# Patient Record
Sex: Female | Born: 1955
Health system: Southern US, Community
[De-identification: ages and names within clinical notes are randomized; demographics above are authoritative.]

## PROBLEM LIST (undated history)

## (undated) DIAGNOSIS — C4491 Basal cell carcinoma of skin, unspecified: Secondary | ICD-10-CM

## (undated) DIAGNOSIS — Z8619 Personal history of other infectious and parasitic diseases: Secondary | ICD-10-CM

## (undated) DIAGNOSIS — I499 Cardiac arrhythmia, unspecified: Secondary | ICD-10-CM

## (undated) DIAGNOSIS — Z9889 Other specified postprocedural states: Secondary | ICD-10-CM

## (undated) DIAGNOSIS — M549 Dorsalgia, unspecified: Secondary | ICD-10-CM

## (undated) DIAGNOSIS — I2510 Atherosclerotic heart disease of native coronary artery without angina pectoris: Secondary | ICD-10-CM

## (undated) DIAGNOSIS — M810 Age-related osteoporosis without current pathological fracture: Secondary | ICD-10-CM

## (undated) DIAGNOSIS — I071 Rheumatic tricuspid insufficiency: Secondary | ICD-10-CM

## (undated) DIAGNOSIS — Z862 Personal history of diseases of the blood and blood-forming organs and certain disorders involving the immune mechanism: Secondary | ICD-10-CM

## (undated) DIAGNOSIS — R112 Nausea with vomiting, unspecified: Secondary | ICD-10-CM

## (undated) DIAGNOSIS — K219 Gastro-esophageal reflux disease without esophagitis: Secondary | ICD-10-CM

## (undated) DIAGNOSIS — K625 Hemorrhage of anus and rectum: Secondary | ICD-10-CM

## (undated) DIAGNOSIS — C3490 Malignant neoplasm of unspecified part of unspecified bronchus or lung: Secondary | ICD-10-CM

## (undated) DIAGNOSIS — F419 Anxiety disorder, unspecified: Secondary | ICD-10-CM

## (undated) DIAGNOSIS — T4145XA Adverse effect of unspecified anesthetic, initial encounter: Secondary | ICD-10-CM

## (undated) DIAGNOSIS — R918 Other nonspecific abnormal finding of lung field: Secondary | ICD-10-CM

## (undated) DIAGNOSIS — K635 Polyp of colon: Secondary | ICD-10-CM

## (undated) DIAGNOSIS — T8859XA Other complications of anesthesia, initial encounter: Secondary | ICD-10-CM

## (undated) DIAGNOSIS — K5790 Diverticulosis of intestine, part unspecified, without perforation or abscess without bleeding: Secondary | ICD-10-CM

## (undated) HISTORY — DX: Personal history of other infectious and parasitic diseases: Z86.19

## (undated) HISTORY — DX: Rheumatic tricuspid insufficiency: I07.1

## (undated) HISTORY — DX: Other nonspecific abnormal finding of lung field: R91.8

## (undated) HISTORY — DX: Atherosclerotic heart disease of native coronary artery without angina pectoris: I25.10

## (undated) HISTORY — PX: BREAST EXCISIONAL BIOPSY: SUR124

## (undated) HISTORY — DX: Basal cell carcinoma of skin, unspecified: C44.91

## (undated) HISTORY — PX: CYSTECTOMY: SUR359

## (undated) HISTORY — DX: Diverticulosis of intestine, part unspecified, without perforation or abscess without bleeding: K57.90

## (undated) HISTORY — DX: Age-related osteoporosis without current pathological fracture: M81.0

## (undated) HISTORY — PX: COLONOSCOPY W/ POLYPECTOMY: SHX1380

## (undated) HISTORY — DX: Hemorrhage of anus and rectum: K62.5

## (undated) HISTORY — PX: TONSILLECTOMY: SUR1361

## (undated) HISTORY — PX: COLONOSCOPY: SHX174

## (undated) HISTORY — DX: Dorsalgia, unspecified: M54.9

---

## 2013-04-12 ENCOUNTER — Other Ambulatory Visit: Payer: Self-pay | Admitting: Family Medicine

## 2013-04-12 DIAGNOSIS — M810 Age-related osteoporosis without current pathological fracture: Secondary | ICD-10-CM

## 2013-04-12 DIAGNOSIS — Z1231 Encounter for screening mammogram for malignant neoplasm of breast: Secondary | ICD-10-CM

## 2013-05-27 ENCOUNTER — Other Ambulatory Visit: Payer: Self-pay

## 2013-05-27 ENCOUNTER — Ambulatory Visit: Payer: Self-pay

## 2013-06-06 ENCOUNTER — Ambulatory Visit
Admission: RE | Admit: 2013-06-06 | Discharge: 2013-06-06 | Disposition: A | Discharge status: Home / Self Care | Payer: Managed Care, Other (non HMO) | Source: Ambulatory Visit | Attending: Family Medicine | Admitting: Family Medicine

## 2013-06-06 DIAGNOSIS — M810 Age-related osteoporosis without current pathological fracture: Secondary | ICD-10-CM

## 2013-06-06 DIAGNOSIS — Z1231 Encounter for screening mammogram for malignant neoplasm of breast: Secondary | ICD-10-CM

## 2014-05-12 ENCOUNTER — Other Ambulatory Visit: Payer: Self-pay

## 2014-05-12 DIAGNOSIS — Z1231 Encounter for screening mammogram for malignant neoplasm of breast: Secondary | ICD-10-CM

## 2014-06-09 ENCOUNTER — Ambulatory Visit
Admission: RE | Admit: 2014-06-09 | Discharge: 2014-06-09 | Disposition: A | Discharge status: Home / Self Care | Payer: Managed Care, Other (non HMO) | Source: Ambulatory Visit

## 2014-06-09 ENCOUNTER — Encounter (INDEPENDENT_AMBULATORY_CARE_PROVIDER_SITE_OTHER): Payer: Self-pay

## 2014-06-09 DIAGNOSIS — Z1231 Encounter for screening mammogram for malignant neoplasm of breast: Secondary | ICD-10-CM

## 2015-04-14 ENCOUNTER — Encounter: Payer: Managed Care, Other (non HMO) | Admitting: Cardiothoracic Surgery

## 2015-04-15 DIAGNOSIS — R918 Other nonspecific abnormal finding of lung field: Secondary | ICD-10-CM

## 2015-04-15 HISTORY — DX: Other nonspecific abnormal finding of lung field: R91.8

## 2015-04-16 ENCOUNTER — Encounter: Payer: Self-pay | Admitting: Cardiothoracic Surgery

## 2015-04-16 ENCOUNTER — Institutional Professional Consult (permissible substitution) (INDEPENDENT_AMBULATORY_CARE_PROVIDER_SITE_OTHER): Payer: Managed Care, Other (non HMO) | Admitting: Cardiothoracic Surgery

## 2015-04-16 ENCOUNTER — Other Ambulatory Visit: Payer: Self-pay | Admitting: *Deleted

## 2015-04-16 VITALS — BP 141/93 | HR 73 | Resp 16 | Ht 63.0 in | Wt 145.0 lb

## 2015-04-16 DIAGNOSIS — R599 Enlarged lymph nodes, unspecified: Secondary | ICD-10-CM

## 2015-04-16 DIAGNOSIS — R591 Generalized enlarged lymph nodes: Secondary | ICD-10-CM

## 2015-04-16 DIAGNOSIS — R918 Other nonspecific abnormal finding of lung field: Secondary | ICD-10-CM | POA: Diagnosis not present

## 2015-04-17 ENCOUNTER — Encounter: Payer: Self-pay | Admitting: Cardiothoracic Surgery

## 2015-04-17 NOTE — H&P (Signed)
PCP is [name], Malka So, MD Referring Provider is [name], [name], MD  Chief Complaint  Patient presents with  . Lung Mass    right upper lobe...per CT CHEST @ [facility] 04/03/15  . 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 She is chest x-ray in early September for sudden onset of chest tightness and difficulty breathing.she denied productive cough, fever, pleuritic pain, weight loss, headache, or malaise. A chest x-ray demonstrated the density and this was followed with a CT scan of the chest. This shows an irregular mass with streaky characteristics in the right upper lobe. Mediastinal lymph nodes are not abnormally enlarged. There are some air bronchograms within the mass/density. The patient's symptoms of pain have resolved.  Past Medical History  Diagnosis Date  . Osteoporosis   . H/O cold sores   . Diverticulosis   . Colon polyp   . Rectal bleeding   . BCC (basal cell carcinoma of skin)   . COPD (chronic obstructive pulmonary disease)   . right upper lobe lung mass 04/15/2015  . Back pain     History reviewed. No pertinent past surgical history.  History reviewed. No pertinent family history.  Social History Social History  Substance Use Topics  . Smoking status: Former Smoker -- 1.00 packs/day for 5 years    Types: Cigarettes    Quit date: 07/26/1975  . Smokeless tobacco: Never Used  . Alcohol Use: 0.0 oz/week    0 Standard drinks or equivalent per week    Current Outpatient Prescriptions  Medication Sig Dispense Refill  . Calcium Carb-Cholecalciferol (CALCIUM 1000 + D PO) Take 4 tablets by mouth daily. D3 = 200 IU per each.    . denosumab (PROLIA) 60 MG/ML SOLN injection Inject 60 mg into the skin  every 6 (six) months. Administer in upper arm, thigh, or abdomen    . L-LYSINE PO Take 1,000 mg by mouth daily.     . Multiple Vitamins-Minerals (CENTRUM SILVER PO) Take 1 tablet by mouth daily.    . Omega-3 Fatty Acids (RA FISH OIL) 1400 MG CPDR Take 1 capsule by mouth daily.     No current facility-administered medications for this visit.    No Known Allergies  Review of Systems        Review of Systems :  [ y ] = yes, [  ] = no        General :  Weight gain [ no  ]    Weight loss  [ no  ]  Fatigue [ no ]  Fever [ no ]  Chills  [no  ]                                Weakness  [no  ]           Cardiac :  Chest pain/ pressure Leilani.Showers  ]  Resting SOB [  ] exertional SOB [  ]                        Orthopnea [  ]  Pedal edema  [  ]  Palpitations [  ] Syncope/presyncope '[ ]'$   Paroxysmal nocturnal dyspnea [  ]        Pulmonary : cough [ no ]  wheezing [  ]  Hemoptysis [  no] Sputum [  ] Snoring [  ]                              Pneumothorax [  ]  Sleep apnea [  ]       GI : Vomiting [  ]  Dysphagia [  ]  Melena  [  ]  Abdominal pain [  ] BRBPR [  ]              Heart burn [  ]  Constipation [  ] Diarrhea  [  ] Colonoscopy [  ]       GU : Hematuria [  ]  Dysuria [  ]  Nocturia [  ] UTI's [  ]       Vascular : Claudication [  ]  Rest pain [  ]  DVT [  ] Vein stripping [  ] leg ulcers [  ]                          TIA [  ] Stroke [  ]  Varicose veins [  ]       NEURO :  Headaches  [  ] Seizures [  ] Vision changes [  ] Paresthesias [  ]patient is left-hand dominant       Musculoskeletal :  Arthritis [  ] Gout  [  ]  Back pain [  ]  Joint pain [  ]       Skin :  Rash [  ]  Melanoma [  ]        Heme : Bleeding problems [  ]Clotting Disorders [  ] Anemia [  ]Blood Transfusion '[ ]'$        Endocrine : Diabetes [no  ] Thyroid Disorder  [  ]       Psych : Depression [  ]  Anxiety [  ]  Psych hospitalizations [  ]                                               BP  141/93 mmHg  Pulse 73  Resp 16  Ht '5\' 3"'$  (1.6 m)  Wt 145 lb (65.772 kg)  BMI 25.69 kg/m2  SpO2 97% Physical Exam      Physical Exam  General: pleasant well-developed middle-aged Caucasian female no acute distress HEENT: Normocephalic pupils equal , dentition adequate Neck: Supple without JVD, adenopathy, or bruit Chest: Clear to auscultation, symmetrical breath sounds, no rhonchi, no tenderness             or deformity Cardiovascular: Regular rate and rhythm, no murmur, no gallop, peripheral pulses             palpable in all extremities Abdomen:  Soft, nontender, no palpable mass or organomegaly Extremities: Warm, well-perfused, no clubbing cyanosis edema or tenderness,              no venous stasis changes of the legs Rectal/GU: Deferred Neuro: Grossly non--focal and symmetrical throughout Skin: Clean and dry without rash or ulceration   Diagnostic  Tests: ct. Scan of the chest performed reviewed showing 3.5-4 cm irregular right upper lobe density with some streaky margins and minimal mediastinal adenopathy. No systemic symptoms of infection or malignancy.  Impression: We will proceed with PET scan to evaluate the malignant potential of this this lung mass as well as mediastinal-supraclavicularnodes. She will complete a course of oral azithromycin to determine if there is any effect from antimicrobial treatment.  Plan:return with results of PET scan. The PET scan shows minimal activity without significant mediastinal or distant metabolic activity and we would probably recommend a transthoracic needle biopsy. If the right upper lobe mass is highly hypermetabolic without mediastinal involvement then resection-lobectomy would be recommended.   Len Childs, MD [facility] [phone]

## 2015-04-22 ENCOUNTER — Ambulatory Visit
Admission: RE | Admit: 2015-04-22 | Discharge: 2015-04-22 | Disposition: A | Discharge status: Home / Self Care | Payer: Managed Care, Other (non HMO) | Source: Ambulatory Visit | Attending: Cardiothoracic Surgery | Admitting: Cardiothoracic Surgery

## 2015-04-22 DIAGNOSIS — R918 Other nonspecific abnormal finding of lung field: Secondary | ICD-10-CM | POA: Diagnosis not present

## 2015-04-22 LAB — GLUCOSE, CAPILLARY: Glucose-Capillary: 72 mg/dL (ref 65–99)

## 2015-04-22 MED ORDER — FLUDEOXYGLUCOSE F - 18 (FDG) INJECTION
12.2400 | Freq: Once | INTRAVENOUS | Status: DC | PRN
  Administered 2015-04-22: 12.24 via INTRAVENOUS
  Filled 2015-04-22: qty 12.24

## 2015-04-29 ENCOUNTER — Other Ambulatory Visit: Payer: Self-pay | Admitting: *Deleted

## 2015-04-29 DIAGNOSIS — R918 Other nonspecific abnormal finding of lung field: Secondary | ICD-10-CM

## 2015-04-29 DIAGNOSIS — R599 Enlarged lymph nodes, unspecified: Secondary | ICD-10-CM

## 2015-04-29 DIAGNOSIS — R591 Generalized enlarged lymph nodes: Secondary | ICD-10-CM

## 2015-04-30 ENCOUNTER — Ambulatory Visit: Payer: Managed Care, Other (non HMO) | Admitting: Cardiothoracic Surgery

## 2015-04-30 ENCOUNTER — Encounter (HOSPITAL_COMMUNITY)
Admission: RE | Admit: 2015-04-30 | Discharge: 2015-04-30 | Disposition: A | Discharge status: Home / Self Care | Payer: Managed Care, Other (non HMO) | Source: Ambulatory Visit | Attending: Cardiothoracic Surgery | Admitting: Cardiothoracic Surgery

## 2015-04-30 ENCOUNTER — Encounter (HOSPITAL_COMMUNITY): Payer: Self-pay

## 2015-04-30 VITALS — BP 164/93 | HR 61 | Temp 98.1°F | Resp 20 | Wt 145.1 lb

## 2015-04-30 DIAGNOSIS — R599 Enlarged lymph nodes, unspecified: Secondary | ICD-10-CM | POA: Diagnosis not present

## 2015-04-30 DIAGNOSIS — R918 Other nonspecific abnormal finding of lung field: Secondary | ICD-10-CM | POA: Diagnosis not present

## 2015-04-30 DIAGNOSIS — Z01812 Encounter for preprocedural laboratory examination: Secondary | ICD-10-CM | POA: Diagnosis not present

## 2015-04-30 DIAGNOSIS — R591 Generalized enlarged lymph nodes: Secondary | ICD-10-CM

## 2015-04-30 HISTORY — DX: Other specified postprocedural states: Z98.890

## 2015-04-30 HISTORY — DX: Polyp of colon: K63.5

## 2015-04-30 HISTORY — DX: Cardiac arrhythmia, unspecified: I49.9

## 2015-04-30 HISTORY — DX: Other complications of anesthesia, initial encounter: T88.59XA

## 2015-04-30 HISTORY — DX: Personal history of diseases of the blood and blood-forming organs and certain disorders involving the immune mechanism: Z86.2

## 2015-04-30 HISTORY — DX: Adverse effect of unspecified anesthetic, initial encounter: T41.45XA

## 2015-04-30 HISTORY — DX: Gastro-esophageal reflux disease without esophagitis: K21.9

## 2015-04-30 HISTORY — DX: Nausea with vomiting, unspecified: R11.2

## 2015-04-30 HISTORY — DX: Anxiety disorder, unspecified: F41.9

## 2015-04-30 LAB — CBC
HCT: 43.2 % (ref 36.0–46.0)
Hemoglobin: 14.4 g/dL (ref 12.0–15.0)
MCH: 31.4 pg (ref 26.0–34.0)
MCHC: 33.3 g/dL (ref 30.0–36.0)
MCV: 94.3 fL (ref 78.0–100.0)
Platelets: 320 10*3/uL (ref 150–400)
RBC: 4.58 MIL/uL (ref 3.87–5.11)
RDW: 13.2 % (ref 11.5–15.5)
WBC: 6.4 10*3/uL (ref 4.0–10.5)

## 2015-04-30 LAB — COMPREHENSIVE METABOLIC PANEL
ALT: 24 U/L (ref 14–54)
AST: 27 U/L (ref 15–41)
Albumin: 4.3 g/dL (ref 3.5–5.0)
Alkaline Phosphatase: 35 U/L — ABNORMAL LOW (ref 38–126)
Anion gap: 7 (ref 5–15)
BUN: 10 mg/dL (ref 6–20)
CO2: 28 mmol/L (ref 22–32)
Calcium: 9.8 mg/dL (ref 8.9–10.3)
Chloride: 103 mmol/L (ref 101–111)
Creatinine, Ser: 0.76 mg/dL (ref 0.44–1.00)
GFR calc Af Amer: >60 mL/min (ref >60)
GFR calc non Af Amer: >60 mL/min (ref >60)
Glucose, Bld: 96 mg/dL (ref 65–99)
Potassium: 4 mmol/L (ref 3.5–5.1)
Sodium: 138 mmol/L (ref 135–145)
Total Bilirubin: 0.6 mg/dL (ref 0.3–1.2)
Total Protein: 6.7 g/dL (ref 6.5–8.1)

## 2015-04-30 LAB — PROTIME-INR
INR: 0.95 (ref 0.00–1.49)
Prothrombin Time: 12.9 seconds (ref 11.6–15.2)

## 2015-04-30 LAB — APTT: aPTT: 28 seconds (ref 24–37)

## 2015-04-30 NOTE — Pre-Procedure Instructions (Signed)
    Rose Figueroa  04/30/2015     No Pharmacies Listed   Your procedure is scheduled on Thursday, October 13th, 2016.  Report to [facility] Admitting at 5:30 A.M.  Call this number if you have problems the morning of surgery:  [phone]   Remember:  Do not eat food or drink liquids after midnight.  Take these medicines the morning of surgery with A SIP OF WATER: None.  Stop taking: Aspirin, NSAIDS, Aleve, Naproxen, Ibuprofen, BC's, Goody's, Advil, Motrin, Fish oil, all herbal medications, and all vitamins.    Do not wear jewelry, make-up or nail polish.  Do not wear lotions, powders, or perfumes.  You may wear deodorant.  Do not shave 48 hours prior to surgery.    Do not bring valuables to the hospital.  [facility] is not responsible for any belongings or valuables.  Contacts, dentures or bridgework may not be worn into surgery.  Leave your suitcase in the car.  After surgery it may be brought to your room.  For patients admitted to the hospital, discharge time will be determined by your treatment team.  Patients discharged the day of surgery will not be allowed to drive home.   Special instructions:  See attached.   Please read over the following fact sheets that you were given. Pain Booklet, Coughing and Deep Breathing and Surgical Site Infection Prevention

## 2015-04-30 NOTE — Progress Notes (Signed)
PCP - Dr. [name] at [facility] Cardiologist - denies  EKG- requested CXR - need DOS  Echo/Stress Test/Cardiac Cath - denies  Patient denies chest pain at PAT appointment.  Patient states that she does have shortness of breath at times but it is consistent with the symptoms that she has been having.

## 2015-05-06 ENCOUNTER — Ambulatory Visit (INDEPENDENT_AMBULATORY_CARE_PROVIDER_SITE_OTHER): Payer: Managed Care, Other (non HMO) | Admitting: Cardiothoracic Surgery

## 2015-05-06 ENCOUNTER — Encounter: Payer: Self-pay | Admitting: Cardiothoracic Surgery

## 2015-05-06 VITALS — BP 138/96 | HR 67 | Resp 16 | Ht 63.0 in | Wt 145.0 lb

## 2015-05-06 DIAGNOSIS — R599 Enlarged lymph nodes, unspecified: Secondary | ICD-10-CM

## 2015-05-06 DIAGNOSIS — R591 Generalized enlarged lymph nodes: Secondary | ICD-10-CM

## 2015-05-06 DIAGNOSIS — R918 Other nonspecific abnormal finding of lung field: Secondary | ICD-10-CM | POA: Diagnosis not present

## 2015-05-06 NOTE — Progress Notes (Signed)
Spoke with patient and instructed her to arrive at 1130 am 05/07/15.

## 2015-05-06 NOTE — Progress Notes (Signed)
PCP is [name], Malka So, MD Referring Provider is [name], [name], MD  Chief Complaint  Patient presents with  . Follow-up    to discuss PET and BRONCH/EBUS    HPI:the patient returns for further discussion of recently diagnosed right upper lobe lung mass. Since her initial consultation she has had a PET scan. This shows the 3 cm right upper lobe spiculated mass to be intensely hypermetabolic SUV of 84.1. There is a peritracheal node with SUV of 7.0 and a subcarinal lymph node with hypermetabolic activity as well. There is a small low activity level supraclavicular right lymph node. No evidence of abdominal or skeletal metastases .  Since her initial consultation the patient has had some right-sided chest pain which was worse after she fell on her right shoulder. She also has some tenderness over her left pretibial thoracotomy. No cough, no hemoptysis, no shortness of breath no headache or dizziness.  Past Medical History  Diagnosis Date  . Osteoporosis   . H/O cold sores   . Diverticulosis   . Rectal bleeding   . BCC (basal cell carcinoma of skin)   . right upper lobe lung mass 04/15/2015  . Back pain   . Colon polyps   . Complication of anesthesia     pt. states that she is difficult to arouse  . PONV (postoperative nausea and vomiting)   . Dysrhythmia   . Anxiety   . Acid reflux   . History of anemia     Past Surgical History  Procedure Laterality Date  . Cystectomy      right breast x 2  . Colonoscopy    . Colonoscopy w/ polypectomy      No family history on file.  Social History Social History  Substance Use Topics  . Smoking status: Former Smoker -- 1.00 packs/day for 5 years    Types: Cigarettes    Quit date: 07/26/1975  . Smokeless tobacco: Never Used  . Alcohol Use: 0.0 oz/week    0 Standard drinks or equivalent per week     Comment: occ.     Current Outpatient Prescriptions  Medication Sig Dispense Refill  . Calcium Carb-Cholecalciferol (CALCIUM 1000 +  D PO) Take 4 tablets by mouth daily. D3 = 200 IU per each.    . denosumab (PROLIA) 60 MG/ML SOLN injection Inject 60 mg into the skin every 6 (six) months. Administer in upper arm, thigh, or abdomen    . L-LYSINE PO Take 1,000 mg by mouth daily.     . Multiple Vitamins-Minerals (CENTRUM SILVER PO) Take 1 tablet by mouth daily.    . Omega-3 Fatty Acids (RA FISH OIL) 1400 MG CPDR Take 1 capsule by mouth daily.    . vitamin B-12 (CYANOCOBALAMIN) 1000 MCG tablet Take 1,000 mcg by mouth daily.     No current facility-administered medications for this visit.    No Known Allergies  Review of Systems  She has stopped taking her calcium and over-the-counter meds for osteoporosis  BP 138/96 mmHg  Pulse 67  Resp 16  Ht '5\' 3"'$  (1.6 m)  Wt 145 lb (65.772 kg)  BMI 25.69 kg/m2  SpO2 98% Physical Exam Alert and comfortable No palpable adenopathy the neck Breath sounds clear and equal Heart rate regular Neuro intact  Diagnostic Tests: PET scan images personally reviewed and counseled with patient and husband, printed report provided patient Impression: Probable stage III carcinoma lung, high likelihood of adenocarcinoma in this 59 year old female with minimal smoking history  Plan:we'll proceed with video bronchoscopy, EBUS and mediastinoscopy tomorrow to obtain tissue diagnosis and for staging. The details of procedure including indications benefits and risks were discussed completely with the patient and husband present.   Len Childs, MD [facility] [phone]

## 2015-05-07 ENCOUNTER — Encounter (HOSPITAL_COMMUNITY)
Admission: RE | Disposition: A | Discharge status: Home / Self Care | Payer: Self-pay | Source: Ambulatory Visit | Attending: Cardiothoracic Surgery

## 2015-05-07 ENCOUNTER — Ambulatory Visit (HOSPITAL_COMMUNITY)
Admission: RE | Admit: 2015-05-07 | Discharge: 2015-05-07 | Disposition: A | Discharge status: Home / Self Care | Payer: Managed Care, Other (non HMO) | Source: Ambulatory Visit | Attending: Cardiothoracic Surgery | Admitting: Cardiothoracic Surgery

## 2015-05-07 ENCOUNTER — Ambulatory Visit (HOSPITAL_COMMUNITY): Payer: Managed Care, Other (non HMO) | Admitting: Anesthesiology

## 2015-05-07 ENCOUNTER — Encounter: Payer: Managed Care, Other (non HMO) | Admitting: *Deleted

## 2015-05-07 ENCOUNTER — Encounter (HOSPITAL_COMMUNITY): Payer: Self-pay | Admitting: *Deleted

## 2015-05-07 ENCOUNTER — Ambulatory Visit (HOSPITAL_COMMUNITY): Payer: Managed Care, Other (non HMO)

## 2015-05-07 DIAGNOSIS — R591 Generalized enlarged lymph nodes: Secondary | ICD-10-CM

## 2015-05-07 DIAGNOSIS — C801 Malignant (primary) neoplasm, unspecified: Secondary | ICD-10-CM | POA: Diagnosis not present

## 2015-05-07 DIAGNOSIS — C771 Secondary and unspecified malignant neoplasm of intrathoracic lymph nodes: Secondary | ICD-10-CM | POA: Diagnosis not present

## 2015-05-07 DIAGNOSIS — Z7983 Long term (current) use of bisphosphonates: Secondary | ICD-10-CM | POA: Insufficient documentation

## 2015-05-07 DIAGNOSIS — R222 Localized swelling, mass and lump, trunk: Secondary | ICD-10-CM | POA: Diagnosis not present

## 2015-05-07 DIAGNOSIS — R599 Enlarged lymph nodes, unspecified: Secondary | ICD-10-CM

## 2015-05-07 DIAGNOSIS — R918 Other nonspecific abnormal finding of lung field: Secondary | ICD-10-CM

## 2015-05-07 DIAGNOSIS — Z79899 Other long term (current) drug therapy: Secondary | ICD-10-CM | POA: Insufficient documentation

## 2015-05-07 DIAGNOSIS — K219 Gastro-esophageal reflux disease without esophagitis: Secondary | ICD-10-CM | POA: Diagnosis not present

## 2015-05-07 DIAGNOSIS — Z87891 Personal history of nicotine dependence: Secondary | ICD-10-CM | POA: Insufficient documentation

## 2015-05-07 HISTORY — PX: MEDIASTINOSCOPY: SHX5086

## 2015-05-07 HISTORY — PX: VIDEO BRONCHOSCOPY WITH ENDOBRONCHIAL ULTRASOUND: SHX6177

## 2015-05-07 SURGERY — BRONCHOSCOPY, WITH EBUS
Anesthesia: General

## 2015-05-07 MED ORDER — PROPOFOL 10 MG/ML IV BOLUS
INTRAVENOUS | Status: DC | PRN
  Administered 2015-05-07: 150 mg via INTRAVENOUS

## 2015-05-07 MED ORDER — LIDOCAINE HCL 4 % MT SOLN
OROMUCOSAL | Status: DC | PRN
  Administered 2015-05-07: 4 mL via TOPICAL

## 2015-05-07 MED ORDER — MIDAZOLAM HCL 2 MG/2ML IJ SOLN
INTRAMUSCULAR | Status: AC
  Filled 2015-05-07: qty 4

## 2015-05-07 MED ORDER — LIDOCAINE HCL (CARDIAC) 20 MG/ML IV SOLN
INTRAVENOUS | Status: AC
  Filled 2015-05-07: qty 5

## 2015-05-07 MED ORDER — OXYCODONE HCL 5 MG PO TABS
5.0000 mg | ORAL_TABLET | ORAL | Status: DC | PRN
  Administered 2015-05-07: 5 mg via ORAL

## 2015-05-07 MED ORDER — ONDANSETRON HCL 4 MG/2ML IJ SOLN
INTRAMUSCULAR | Status: DC | PRN
  Administered 2015-05-07: 4 mg via INTRAVENOUS

## 2015-05-07 MED ORDER — ROCURONIUM BROMIDE 50 MG/5ML IV SOLN
INTRAVENOUS | Status: AC
  Filled 2015-05-07: qty 1

## 2015-05-07 MED ORDER — SUGAMMADEX SODIUM 200 MG/2ML IV SOLN
INTRAVENOUS | Status: DC | PRN
Start: 2015-05-07 — End: 2015-05-07
  Administered 2015-05-07: 200 mg via INTRAVENOUS

## 2015-05-07 MED ORDER — FENTANYL CITRATE (PF) 250 MCG/5ML IJ SOLN
INTRAMUSCULAR | Status: AC
  Filled 2015-05-07: qty 5

## 2015-05-07 MED ORDER — PROPOFOL 10 MG/ML IV BOLUS
INTRAVENOUS | Status: AC
  Filled 2015-05-07: qty 20

## 2015-05-07 MED ORDER — HEMOSTATIC AGENTS (NO CHARGE) OPTIME
TOPICAL | Status: DC | PRN
  Administered 2015-05-07: 1 via TOPICAL

## 2015-05-07 MED ORDER — PROMETHAZINE HCL 25 MG/ML IJ SOLN
6.2500 mg | INTRAMUSCULAR | Status: DC | PRN
  Administered 2015-05-07: 6.25 mg via INTRAVENOUS

## 2015-05-07 MED ORDER — SUGAMMADEX SODIUM 200 MG/2ML IV SOLN
INTRAVENOUS | Status: AC
  Filled 2015-05-07: qty 2

## 2015-05-07 MED ORDER — PROMETHAZINE HCL 25 MG/ML IJ SOLN
INTRAMUSCULAR | Status: AC
  Filled 2015-05-07: qty 1

## 2015-05-07 MED ORDER — OXYCODONE HCL 5 MG PO TABS
ORAL_TABLET | ORAL | Status: AC
  Filled 2015-05-07: qty 1

## 2015-05-07 MED ORDER — FENTANYL CITRATE (PF) 100 MCG/2ML IJ SOLN
INTRAMUSCULAR | Status: AC
  Filled 2015-05-07: qty 2

## 2015-05-07 MED ORDER — PHENYLEPHRINE HCL 10 MG/ML IJ SOLN
INTRAMUSCULAR | Status: DC | PRN
  Administered 2015-05-07: 40 ug via INTRAVENOUS
  Administered 2015-05-07 (×4): 80 ug via INTRAVENOUS
  Administered 2015-05-07: 40 ug via INTRAVENOUS
  Administered 2015-05-07: 80 ug via INTRAVENOUS

## 2015-05-07 MED ORDER — MIDAZOLAM HCL 5 MG/5ML IJ SOLN
INTRAMUSCULAR | Status: DC | PRN
  Administered 2015-05-07: 2 mg via INTRAVENOUS

## 2015-05-07 MED ORDER — FENTANYL CITRATE (PF) 100 MCG/2ML IJ SOLN: 25.0000 ug | INTRAMUSCULAR | Status: DC | PRN

## 2015-05-07 MED ORDER — LIDOCAINE HCL (CARDIAC) 20 MG/ML IV SOLN
INTRAVENOUS | Status: DC | PRN
  Administered 2015-05-07: 100 mg via INTRAVENOUS

## 2015-05-07 MED ORDER — CEFAZOLIN SODIUM-DEXTROSE 2-3 GM-% IV SOLR
2.0000 g | Freq: Once | INTRAVENOUS | Status: AC
  Administered 2015-05-07: 2 g via INTRAVENOUS
  Filled 2015-05-07: qty 50

## 2015-05-07 MED ORDER — ROCURONIUM BROMIDE 100 MG/10ML IV SOLN
INTRAVENOUS | Status: DC | PRN
  Administered 2015-05-07: 10 mg via INTRAVENOUS
  Administered 2015-05-07: 50 mg via INTRAVENOUS

## 2015-05-07 MED ORDER — EPHEDRINE SULFATE 50 MG/ML IJ SOLN
INTRAMUSCULAR | Status: DC | PRN
  Administered 2015-05-07 (×2): 5 mg via INTRAVENOUS

## 2015-05-07 MED ORDER — CEFAZOLIN SODIUM-DEXTROSE 2-3 GM-% IV SOLR
INTRAVENOUS | Status: AC
  Filled 2015-05-07: qty 50

## 2015-05-07 MED ORDER — 0.9 % SODIUM CHLORIDE (POUR BTL) OPTIME
TOPICAL | Status: DC | PRN
  Administered 2015-05-07: 1000 mL

## 2015-05-07 MED ORDER — FENTANYL CITRATE (PF) 250 MCG/5ML IJ SOLN
INTRAMUSCULAR | Status: DC | PRN
  Administered 2015-05-07: 50 ug via INTRAVENOUS
  Administered 2015-05-07: 100 ug via INTRAVENOUS
  Administered 2015-05-07: 50 ug via INTRAVENOUS

## 2015-05-07 MED ORDER — NEOSTIGMINE METHYLSULFATE 10 MG/10ML IV SOLN
INTRAVENOUS | Status: AC
  Filled 2015-05-07: qty 1

## 2015-05-07 MED ORDER — LACTATED RINGERS IV SOLN
INTRAVENOUS | Status: DC
  Administered 2015-05-07: 50 mL/h via INTRAVENOUS
  Administered 2015-05-07: 15:00:00 via INTRAVENOUS

## 2015-05-07 SURGICAL SUPPLY — 70 items
BENZOIN TINCTURE PRP APPL 2/3 (GAUZE/BANDAGES/DRESSINGS) ×3 IMPLANT
BLADE SURG 10 STRL SS (BLADE) ×3 IMPLANT
BLADE SURG 15 STRL LF DISP TIS (BLADE) ×1 IMPLANT
BLADE SURG 15 STRL SS (BLADE) ×2
BRUSH CYTOL CELLEBRITY 1.5X140 (MISCELLANEOUS) IMPLANT
BRUSH CYTOL CELLEBRITY 1.9X150 (MISCELLANEOUS) ×3 IMPLANT
CANISTER SUCTION 2500CC (MISCELLANEOUS) ×6 IMPLANT
CLIP TI MEDIUM 6 (CLIP) IMPLANT
CLIP TI WIDE RED SMALL 24 (CLIP) ×3 IMPLANT
CLIP TI WIDE RED SMALL 6 (CLIP) IMPLANT
CLOSURE STERI-STRIP 1/2X4 (GAUZE/BANDAGES/DRESSINGS) ×1
CLSR STERI-STRIP ANTIMIC 1/2X4 (GAUZE/BANDAGES/DRESSINGS) ×2 IMPLANT
CONT SPEC 4OZ CLIKSEAL STRL BL (MISCELLANEOUS) ×9 IMPLANT
CONT SPECI 4OZ STER CLIK (MISCELLANEOUS) ×12 IMPLANT
COTTONBALL LRG STERILE PKG (GAUZE/BANDAGES/DRESSINGS) IMPLANT
COVER DOME SNAP 22 D (MISCELLANEOUS) ×3 IMPLANT
COVER SURGICAL LIGHT HANDLE (MISCELLANEOUS) ×6 IMPLANT
COVER TABLE BACK 60X90 (DRAPES) ×3 IMPLANT
DERMABOND ADVANCED (GAUZE/BANDAGES/DRESSINGS) ×2
DERMABOND ADVANCED .7 DNX12 (GAUZE/BANDAGES/DRESSINGS) ×1 IMPLANT
DRAPE LAPAROTOMY T 102X78X121 (DRAPES) ×3 IMPLANT
DRSG AQUACEL AG ADV 3.5X14 (GAUZE/BANDAGES/DRESSINGS) ×3 IMPLANT
ELECT CAUTERY BLADE 6.4 (BLADE) ×3 IMPLANT
ELECT REM PT RETURN 9FT ADLT (ELECTROSURGICAL) ×3
ELECTRODE REM PT RTRN 9FT ADLT (ELECTROSURGICAL) ×1 IMPLANT
FORCEPS BIOP RJ4 1.8 (CUTTING FORCEPS) IMPLANT
GAUZE SPONGE 4X4 12PLY STRL (GAUZE/BANDAGES/DRESSINGS) ×3 IMPLANT
GAUZE SPONGE 4X4 16PLY XRAY LF (GAUZE/BANDAGES/DRESSINGS) ×3 IMPLANT
GLOVE BIO SURGEON STRL SZ7.5 (GLOVE) ×9 IMPLANT
GOWN STRL REUS W/ TWL LRG LVL3 (GOWN DISPOSABLE) ×2 IMPLANT
GOWN STRL REUS W/TWL LRG LVL3 (GOWN DISPOSABLE) ×4
HEMOSTAT SURGICEL 2X14 (HEMOSTASIS) IMPLANT
KIT BASIN OR (CUSTOM PROCEDURE TRAY) ×3 IMPLANT
KIT CLEAN ENDO COMPLIANCE (KITS) ×9 IMPLANT
KIT ROOM TURNOVER OR (KITS) ×6 IMPLANT
MARKER SKIN DUAL TIP RULER LAB (MISCELLANEOUS) ×3 IMPLANT
NEEDLE 22X1 1/2 (OR ONLY) (NEEDLE) IMPLANT
NEEDLE BIOPSY TRANSBRONCH 21G (NEEDLE) IMPLANT
NEEDLE BLUNT 18X1 FOR OR ONLY (NEEDLE) IMPLANT
NEEDLE SONO TIP II EBUS (NEEDLE) ×3 IMPLANT
NEEDLE SYS SONOTIP II EBUSTBNA (NEEDLE) ×3 IMPLANT
NS IRRIG 1000ML POUR BTL (IV SOLUTION) ×6 IMPLANT
OIL SILICONE PENTAX (PARTS (SERVICE/REPAIRS)) ×3 IMPLANT
PACK SURGICAL SETUP 50X90 (CUSTOM PROCEDURE TRAY) ×3 IMPLANT
PAD ARMBOARD 7.5X6 YLW CONV (MISCELLANEOUS) ×12 IMPLANT
PENCIL BUTTON HOLSTER BLD 10FT (ELECTRODE) ×3 IMPLANT
SPONGE INTESTINAL PEANUT (DISPOSABLE) ×3 IMPLANT
STAPLER VISISTAT 35W (STAPLE) IMPLANT
SUT SILK 2 0 TIES 10X30 (SUTURE) IMPLANT
SUT VIC AB 2-0 CT1 27 (SUTURE) ×2
SUT VIC AB 2-0 CT1 TAPERPNT 27 (SUTURE) ×1 IMPLANT
SUT VIC AB 3-0 SH 27 (SUTURE) ×2
SUT VIC AB 3-0 SH 27X BRD (SUTURE) ×1 IMPLANT
SUT VIC AB 3-0 SH 8-18 (SUTURE) ×6 IMPLANT
SUT VIC AB 3-0 X1 27 (SUTURE) ×3 IMPLANT
SWAB COLLECTION DEVICE MRSA (MISCELLANEOUS) IMPLANT
SYR 20CC LL (SYRINGE) ×3 IMPLANT
SYR 20ML ECCENTRIC (SYRINGE) ×3 IMPLANT
SYR 5ML LUER SLIP (SYRINGE) ×3 IMPLANT
SYR CONTROL 10ML LL (SYRINGE) ×3 IMPLANT
SYRINGE 10CC LL (SYRINGE) ×3 IMPLANT
TOWEL OR 17X24 6PK STRL BLUE (TOWEL DISPOSABLE) ×3 IMPLANT
TOWEL OR 17X26 10 PK STRL BLUE (TOWEL DISPOSABLE) ×6 IMPLANT
TRAP SPECIMEN MUCOUS 40CC (MISCELLANEOUS) ×9 IMPLANT
TUBE ANAEROBIC SPECIMEN COL (MISCELLANEOUS) IMPLANT
TUBE CONNECTING 12'X1/4 (SUCTIONS) ×1
TUBE CONNECTING 12X1/4 (SUCTIONS) ×2 IMPLANT
TUBE CONNECTING 20'X1/4 (TUBING) ×2
TUBE CONNECTING 20X1/4 (TUBING) ×4 IMPLANT
WATER STERILE IRR 1000ML POUR (IV SOLUTION) ×3 IMPLANT

## 2015-05-07 NOTE — Brief Op Note (Addendum)
05/07/2015  4:15 PM  PATIENT:  Rose Figueroa  59 y.o. female  PRE-OPERATIVE DIAGNOSIS:  LUNG MASS ADENOPATHY  POST-OPERATIVE DIAGNOSIS:  LUNG MASS ADENOPATHY  PROCEDURE:  Procedure(s): VIDEO BRONCHOSCOPY WITH ENDOBRONCHIAL ULTRASOUND (N/A) MEDIASTINOSCOPY (N/A)  SURGEON:  Surgeon(s) and Role:    * Ivin Poot, MD - Primary  PHYSICIAN ASSISTANT:   ASSISTANTS: none   ANESTHESIA:   general  EBL:  Total I/O In: 800 [I.V.:800] Out: -   BLOOD ADMINISTERED:none  DRAINS: none   LOCAL MEDICATIONS USED:  NONE  SPECIMEN:  Aspirate and Biopsy / Limited Resection                          Level 7 subcarinal node, 4 R node bx  DISPOSITION OF SPECIMEN:  PATHOLOGY  COUNTS:  YES  TOURNIQUET:  * No tourniquets in log *  DICTATION: .Dragon Dictation  PLAN OF CARE: Discharge to home after PACU  PATIENT DISPOSITION:  PACU - hemodynamically stable.   Delay start of Pharmacological VTE agent (>24hrs) due to surgical blood loss or risk of bleeding: yes

## 2015-05-07 NOTE — Anesthesia Procedure Notes (Signed)
Procedure Name: Intubation Date/Time: 05/07/2015 1:44 PM Performed by: Tamala Fothergill S Patient Re-evaluated:Patient Re-evaluated prior to inductionOxygen Delivery Method: Circle system utilized Preoxygenation: Pre-oxygenation with 100% oxygen Intubation Type: IV induction Ventilation: Mask ventilation without difficulty Laryngoscope Size: Mac and 4 Grade View: Grade II Tube type: Oral Tube size: 8.0 mm Number of attempts: 1 Placement Confirmation: ETT inserted through vocal cords under direct vision,  breath sounds checked- equal and bilateral and positive ETCO2 Tube secured with: Tape Dental Injury: Teeth and Oropharynx as per pre-operative assessment

## 2015-05-07 NOTE — Anesthesia Postprocedure Evaluation (Signed)
  Anesthesia Post-op Note  Patient: Rose Figueroa  Procedure(s) Performed: Procedure(s) (LRB): VIDEO BRONCHOSCOPY WITH ENDOBRONCHIAL ULTRASOUND (N/A) MEDIASTINOSCOPY (N/A)  Patient Location: PACU  Anesthesia Type: General  Level of Consciousness: awake and alert   Airway and Oxygen Therapy: Patient Spontanous Breathing  Post-op Pain: mild  Post-op Assessment: Post-op Vital signs reviewed, Patient's Cardiovascular Status Stable, Respiratory Function Stable, Patent Airway and No signs of Nausea or vomiting  Last Vitals:  Filed Vitals:   05/07/15 1637  BP: 120/72  Pulse: 88  Temp:   Resp: 17    Post-op Vital Signs: stable   Complications: No apparent anesthesia complications

## 2015-05-07 NOTE — Anesthesia Preprocedure Evaluation (Addendum)
Anesthesia Evaluation  Patient identified by MRN, date of birth, ID band Patient awake    Reviewed: Allergy & Precautions, NPO status , Patient's Chart, lab work & pertinent test results  History of Anesthesia Complications (+) PONV and history of anesthetic complications  Airway Mallampati: II  TM Distance: >3 FB Neck ROM: Full    Dental  (+) Teeth Intact, Dental Advisory Given   Pulmonary former smoker,  Lung ca    Pulmonary exam normal breath sounds clear to auscultation       Cardiovascular Exercise Tolerance: Good (-) hypertension(-) angina(-) Past MI Normal cardiovascular exam+ dysrhythmias (in 20s) Atrial Fibrillation  Rhythm:Regular Rate:Normal     Neuro/Psych PSYCHIATRIC DISORDERS Anxiety negative neurological ROS     GI/Hepatic negative GI ROS, Neg liver ROS, GERD  ,  Endo/Other  negative endocrine ROS  Renal/GU negative Renal ROS     Musculoskeletal negative musculoskeletal ROS (+)   Abdominal   Peds  Hematology negative hematology ROS (+)   Anesthesia Other Findings Day of surgery medications reviewed with the patient.  Reproductive/Obstetrics negative OB ROS                            Anesthesia Physical Anesthesia Plan  ASA: III  Anesthesia Plan: General   Post-op Pain Management:    Induction: Intravenous  Airway Management Planned: Oral ETT  Additional Equipment:   Intra-op Plan:   Post-operative Plan: Extubation in OR  Informed Consent: I have reviewed the patients History and Physical, chart, labs and discussed the procedure including the risks, benefits and alternatives for the proposed anesthesia with the patient or authorized representative who has indicated his/her understanding and acceptance.   Dental advisory given  Plan Discussed with: CRNA  Anesthesia Plan Comments: (Risks/benefits of general anesthesia discussed with patient including risk of  damage to teeth, lips, gum, and tongue, nausea/vomiting, allergic reactions to medications, and the possibility of heart attack, stroke and death.  All patient questions answered.  Patient wishes to proceed.)        Anesthesia Quick Evaluation

## 2015-05-07 NOTE — Progress Notes (Signed)
The patient was examined and preop studies reviewed. There has been no change from the prior exam and the patient is ready for surgery.  plan broncoscopy, EBUS and mediastinoscopy on B [place]

## 2015-05-07 NOTE — Transfer of Care (Signed)
Immediate Anesthesia Transfer of Care Note  Patient: Rose Figueroa  Procedure(s) Performed: Procedure(s): VIDEO BRONCHOSCOPY WITH ENDOBRONCHIAL ULTRASOUND (N/A) MEDIASTINOSCOPY (N/A)  Patient Location: PACU  Anesthesia Type:General  Level of Consciousness: awake, alert  and oriented  Airway & Oxygen Therapy: Patient Spontanous Breathing and Patient connected to nasal cannula oxygen  Post-op Assessment: Report given to RN and Post -op Vital signs reviewed and stable  Post vital signs: Reviewed and stable  Last Vitals:  Filed Vitals:   05/07/15 1621  BP:   Pulse:   Temp: 36.4 C  Resp:     Complications: No apparent anesthesia complications

## 2015-05-08 ENCOUNTER — Encounter (HOSPITAL_COMMUNITY): Payer: Self-pay | Admitting: Cardiothoracic Surgery

## 2015-05-08 NOTE — Op Note (Signed)
NAMEMANDALYN, PASQUA NO.:  [id number]  MEDICAL RECORD NO.:  [id number]  LOCATION:  [facility]                         FACILITY:  [facility]  PHYSICIAN:  Ivin Poot, M.D.  DATE OF BIRTH:  1956/04/28  DATE OF PROCEDURE:  05/07/2015 DATE OF DISCHARGE:  05/07/2015                              OPERATIVE REPORT   OPERATION: 1. Video bronchoscopy. 2. EBUS-endobronchial ultrasound-guided transbronchial biopsy of     mediastinal lymph node. 3. Mediastinoscopy and biopsy of mediastinal lymph nodes.  SURGEON:  Ivin Poot, MD.  PREOPERATIVE DIAGNOSIS:  Right upper lobe mass with PET positive mediastinal lymph nodes.  POSTOPERATIVE DIAGNOSIS:  Right upper lobe mass with PET positive mediastinal lymph nodes.  ANESTHESIA:  General.  DESCRIPTION OF PROCEDURE:  After the patient had been counseled and prepared for bronchoscopy and mediastinoscopy and informed consent was obtained, the patient was brought to the operating room and placed supine on the operating table where general anesthesia was induced.  A proper time-out was performed.  Through the endotracheal tube, a video bronchoscope was passed.  Images of the endobronchial tree were of good quality.  The distal trachea and carina were sharp and normal.  The bronchoscope was passed down the right mainstem bronchus.  The endobronchial segments of the right upper lobe, right middle lobe, and right lower lobes were visualized and no endobronchial tumors were noted.  Washings of the right upper lobe and brushings of the right upper lobe were taken and sent for cytology.  The bronchoscope was then passed down the left mainstem bronchus.  The mainstem bronchus was clear.  The endobronchial segments of the left upper lobe and left lower lobe were visualized and there were no endobronchial lesions or abnormalities noted.  The bronchoscope was withdrawn.  The EBUS scope was then inserted.  Using ultrasound imaging, the  level 7 carinal lymph node was imaged and several transbronchial aspirations were performed.  These were sent for both quick prep and permanent cytology prep.  The bronchoscope was withdrawn.  The patient was then prepped and draped from the neck to the upper abdomen.  A second time-out was performed for mediastinoscopy.  A small incision was made above the sternal notch.  The soft tissue was dissected down to the pretracheal plane.  The pretracheal plane was followed down underneath the innominate artery.  The 4R lymph node region was then sampled with a biopsy forceps and the tissue was sent for permanent section.  There was minimal bleeding.  The incision was closed in layers using interrupted 3-0 Vicryl for the subcutaneous layer and running subcuticular Vicryl for the skin.  Sterile dressings applied.  The patient was extubated and returned to the recovery room in stable condition.     Ivin Poot, M.D.     PV/MEDQ  D:  05/07/2015  T:  05/08/2015  Job:  [id number]

## 2015-05-11 ENCOUNTER — Encounter: Payer: Self-pay | Admitting: *Deleted

## 2015-05-11 ENCOUNTER — Telehealth: Payer: Self-pay | Admitting: *Deleted

## 2015-05-11 DIAGNOSIS — R918 Other nonspecific abnormal finding of lung field: Secondary | ICD-10-CM

## 2015-05-11 NOTE — Telephone Encounter (Signed)
Oncology Nurse Navigator Documentation  Oncology Nurse Navigator Flowsheets 05/11/2015  Referral date to RadOnc/MedOnc 05/07/2015  Navigator Encounter Type Telephone/I received a referral for Rose Figueroa. I called her to set up with Medical and Radiation Oncology.  She will she Rose Figueroa on 05/19/15 arrive at 1:45 and Rose Figueroa at [facility] 05/21/15.  Patient verbalized understanding of appt time and place.   Patient Visit Type Initial  Treatment Phase Abnormal Scans  Time Spent with Patient 15

## 2015-05-13 ENCOUNTER — Other Ambulatory Visit: Payer: Self-pay | Admitting: *Deleted

## 2015-05-13 ENCOUNTER — Ambulatory Visit (INDEPENDENT_AMBULATORY_CARE_PROVIDER_SITE_OTHER): Payer: Managed Care, Other (non HMO) | Admitting: Cardiothoracic Surgery

## 2015-05-13 ENCOUNTER — Encounter: Payer: Self-pay | Admitting: Cardiothoracic Surgery

## 2015-05-13 VITALS — BP 134/90 | HR 82 | Resp 20 | Ht 63.0 in | Wt 145.0 lb

## 2015-05-13 DIAGNOSIS — R918 Other nonspecific abnormal finding of lung field: Secondary | ICD-10-CM | POA: Diagnosis not present

## 2015-05-13 DIAGNOSIS — C3411 Malignant neoplasm of upper lobe, right bronchus or lung: Secondary | ICD-10-CM

## 2015-05-13 DIAGNOSIS — R591 Generalized enlarged lymph nodes: Secondary | ICD-10-CM

## 2015-05-13 DIAGNOSIS — Z9889 Other specified postprocedural states: Secondary | ICD-10-CM | POA: Diagnosis not present

## 2015-05-13 DIAGNOSIS — R599 Enlarged lymph nodes, unspecified: Secondary | ICD-10-CM

## 2015-05-13 NOTE — Progress Notes (Signed)
PCP is [name], Malka So, MD Referring Provider is [name], [name], MD  Chief Complaint  Patient presents with  . Routine Post Op    s/p EBUS, Mediastinoscopy and biopsy 05/07/15    HPI:the patient returns for followup after bronchoscopic biopsy and mediastinoscopy biopsy for spiculated 4 cm right upper lobe mass with hypermetabolic mediastinal nodes.  EBUS aspirate of 4R lymph nodes positive for non-small cell carcinoma, probable adenocarcinoma.  Mediastinoscopy biopsy of 2 R lymph node for adequate tissue to provide tumor markers was negative.  Brushings of right upper lobe were negative  Pathology report discussed with patient. Current stage is IIIb . Patient needs brain MRI to complete clinical staging which will be ordered. Patient will see Dr. Julien Nordmann in 5 days.  Neck incision is healing satisfactorily. No hemoptysis or shortness of breath.   Past Medical History  Diagnosis Date  . Osteoporosis   . H/O cold sores   . Diverticulosis   . Rectal bleeding   . BCC (basal cell carcinoma of skin)   . right upper lobe lung mass 04/15/2015  . Back pain   . Colon polyps   . Complication of anesthesia     pt. states that she is difficult to arouse  . PONV (postoperative nausea and vomiting)   . Dysrhythmia   . Anxiety   . Complication of anesthesia   . History of anemia     Past Surgical History  Procedure Laterality Date  . Cystectomy      right breast x 2  . Colonoscopy    . Colonoscopy w/ polypectomy    . Video bronchoscopy with endobronchial ultrasound N/A 05/07/2015    Procedure: VIDEO BRONCHOSCOPY WITH ENDOBRONCHIAL ULTRASOUND;  Surgeon: Ivin Poot, MD;  Location: [facility];  Service: Thoracic;  Laterality: N/A;  . Mediastinoscopy N/A 05/07/2015    Procedure: MEDIASTINOSCOPY;  Surgeon: Ivin Poot, MD;  Location: [facility];  Service: Thoracic;  Laterality: N/A;    No family history on file.  Social History Social History  Substance Use Topics  . Smoking  status: Former Smoker -- 1.00 packs/day for 5 years    Types: Cigarettes    Quit date: 07/26/1975  . Smokeless tobacco: Never Used  . Alcohol Use: 0.0 oz/week    0 Standard drinks or equivalent per week     Comment: occ.     Current Outpatient Prescriptions  Medication Sig Dispense Refill  . Calcium Carb-Cholecalciferol (CALCIUM 1000 + D PO) Take 4 tablets by mouth daily. D3 = 200 IU per each.    . denosumab (PROLIA) 60 MG/ML SOLN injection Inject 60 mg into the skin every 6 (six) months. Administer in upper arm, thigh, or abdomen    . L-LYSINE PO Take 1,000 mg by mouth daily.     . Multiple Vitamins-Minerals (CENTRUM SILVER PO) Take 1 tablet by mouth daily.    . Omega-3 Fatty Acids (RA FISH OIL) 1400 MG CPDR Take 1 capsule by mouth daily.    Marland Kitchen Spirulina 500 MG TABS Take by mouth daily.    . Turmeric Curcumin 500 MG CAPS Take by mouth daily.    . vitamin B-12 (CYANOCOBALAMIN) 1000 MCG tablet Take 1,000 mcg by mouth daily.     No current facility-administered medications for this visit.    No Known Allergies  Review of Systems  Mild sore throat  BP 134/90 mmHg  Pulse 82  Resp 20  Ht '5\' 3"'$  (1.6 m)  Wt 145 lb (65.772 kg)  BMI 25.69 kg/m2  SpO2 98% Physical Exam Alert and comfortable Breath sounds clear and equal Neck without edema or erythema Steri-Strips removed from neck incision Heart rhythm regular  Diagnostic Tests: Results of pathology discussed with patient and her father and implications of  Pathologic stage IIIb.  Impression: Plan Brain MRI has been ordered Patient has scheduled appointments with medical oncology and radiation oncology She will return as needed.    Len Childs, MD [facility] [phone]

## 2015-05-14 ENCOUNTER — Encounter: Payer: Self-pay | Admitting: *Deleted

## 2015-05-18 ENCOUNTER — Ambulatory Visit (HOSPITAL_COMMUNITY)
Admission: RE | Admit: 2015-05-18 | Discharge: 2015-05-18 | Disposition: A | Discharge status: Home / Self Care | Payer: Managed Care, Other (non HMO) | Source: Ambulatory Visit | Attending: Cardiothoracic Surgery | Admitting: Cardiothoracic Surgery

## 2015-05-18 DIAGNOSIS — C3411 Malignant neoplasm of upper lobe, right bronchus or lung: Secondary | ICD-10-CM | POA: Diagnosis not present

## 2015-05-18 DIAGNOSIS — R51 Headache: Secondary | ICD-10-CM | POA: Insufficient documentation

## 2015-05-18 MED ORDER — GADOBENATE DIMEGLUMINE 529 MG/ML IV SOLN
15.0000 mL | Freq: Once | INTRAVENOUS | Status: AC | PRN
  Administered 2015-05-18: 15 mL via INTRAVENOUS

## 2015-05-19 ENCOUNTER — Ambulatory Visit (HOSPITAL_BASED_OUTPATIENT_CLINIC_OR_DEPARTMENT_OTHER): Payer: Managed Care, Other (non HMO)

## 2015-05-19 ENCOUNTER — Telehealth: Payer: Self-pay | Admitting: Internal Medicine

## 2015-05-19 ENCOUNTER — Encounter: Payer: Self-pay | Admitting: *Deleted

## 2015-05-19 ENCOUNTER — Ambulatory Visit (HOSPITAL_BASED_OUTPATIENT_CLINIC_OR_DEPARTMENT_OTHER): Payer: Managed Care, Other (non HMO) | Admitting: Internal Medicine

## 2015-05-19 ENCOUNTER — Encounter: Payer: Self-pay | Admitting: Internal Medicine

## 2015-05-19 ENCOUNTER — Other Ambulatory Visit: Payer: Self-pay | Admitting: Internal Medicine

## 2015-05-19 VITALS — BP 126/78 | HR 65 | Temp 97.7°F | Resp 18 | Ht 63.0 in | Wt 142.5 lb

## 2015-05-19 DIAGNOSIS — C3491 Malignant neoplasm of unspecified part of right bronchus or lung: Secondary | ICD-10-CM

## 2015-05-19 DIAGNOSIS — C3411 Malignant neoplasm of upper lobe, right bronchus or lung: Secondary | ICD-10-CM

## 2015-05-19 DIAGNOSIS — Z87891 Personal history of nicotine dependence: Secondary | ICD-10-CM | POA: Diagnosis not present

## 2015-05-19 DIAGNOSIS — M810 Age-related osteoporosis without current pathological fracture: Secondary | ICD-10-CM

## 2015-05-19 HISTORY — DX: Malignant neoplasm of upper lobe, right bronchus or lung: C34.11

## 2015-05-19 LAB — COMPREHENSIVE METABOLIC PANEL (CC13)
ALT: 18 U/L (ref 0–55)
AST: 20 U/L (ref 5–34)
Albumin: 4 g/dL (ref 3.5–5.0)
Alkaline Phosphatase: 41 U/L (ref 40–150)
Anion Gap: 7 mEq/L (ref 3–11)
BUN: 9.3 mg/dL (ref 7.0–26.0)
CALCIUM: 9.6 mg/dL (ref 8.4–10.4)
CHLORIDE: 107 meq/L (ref 98–109)
CO2: 27 meq/L (ref 22–29)
CREATININE: 0.7 mg/dL (ref 0.6–1.1)
EGFR: 90 mL/min/{1.73_m2} — ABNORMAL LOW (ref >90)
Glucose: 111 mg/dl (ref 70–140)
Potassium: 4.2 mEq/L (ref 3.5–5.1)
Sodium: 140 mEq/L (ref 136–145)
TOTAL PROTEIN: 6.4 g/dL (ref 6.4–8.3)

## 2015-05-19 LAB — CBC WITH DIFFERENTIAL/PLATELET
BASO%: 0.7 % (ref 0.0–2.0)
Basophils Absolute: 0 10*3/uL (ref 0.0–0.1)
EOS%: 2.4 % (ref 0.0–7.0)
Eosinophils Absolute: 0.1 10*3/uL (ref 0.0–0.5)
HEMATOCRIT: 40.4 % (ref 34.8–46.6)
HGB: 13.7 g/dL (ref 11.6–15.9)
LYMPH#: 2.1 10*3/uL (ref 0.9–3.3)
LYMPH%: 49.9 % — ABNORMAL HIGH (ref 14.0–49.7)
MCH: 32 pg (ref 25.1–34.0)
MCHC: 33.9 g/dL (ref 31.5–36.0)
MCV: 94.4 fL (ref 79.5–101.0)
MONO#: 0.3 10*3/uL (ref 0.1–0.9)
MONO%: 7.8 % (ref 0.0–14.0)
NEUT%: 39.2 % (ref 38.4–76.8)
NEUTROS ABS: 1.7 10*3/uL (ref 1.5–6.5)
PLATELETS: 292 10*3/uL (ref 145–400)
RBC: 4.28 10*6/uL (ref 3.70–5.45)
RDW: 12.7 % (ref 11.2–14.5)
WBC: 4.2 10*3/uL (ref 3.9–10.3)

## 2015-05-19 NOTE — Telephone Encounter (Signed)
Gave adn printed papt sched and avs fo rpt for OCT thru [facility]

## 2015-05-19 NOTE — Progress Notes (Signed)
Oncology Nurse Navigator Documentation  Oncology Nurse Navigator Flowsheets 05/19/2015  Navigator Encounter Type Clinic/MDC/per Dr. Julien Nordmann, I completed [facility] request form and appt for lab drawl.   I spoke with patient at her new patient appt with Dr. Julien Nordmann.  Updated her on appt for clinic   Patient Visit Type Initial  Treatment Phase Abnormal Scans  Barriers/Navigation Needs Education  Education Other  Time Spent with Patient 30

## 2015-05-19 NOTE — Progress Notes (Signed)
[facility] [phone]   [phone]  CONSULT NOTE  REFERRING PHYSICIAN: Dr. Tharon Aquas Trigt  REASON FOR CONSULTATION:  59 years old white female recently diagnosed with lung cancer  HPI Rose Figueroa is a 59 y.o. female with no significant past medical history except for diverticulosis and surgical excision of benign breast cysts. The patient has been doing fine until recently when she started having chest tightness as well as shortness of breath. She was seen by her primary care physician and had cardiac workup performed that was unremarkable. Chest x-ray on 04/03/2015 showed abnormality in the right lung. This was followed by CT scan of the chest on 04/03/2015 performed at [facility] and it showed right apical mass lesion in addition to right supraclavicular lymphadenopathy measuring 1.0 x 0.9 him a subcarinal lymph node 2.1 x 1.3 in addition to right hilar and other small mediastinal lymph nodes. The patient was referred to Dr. Prescott Gum and a PET scan was performed on 04/22/2015 and it showed a spiculated mass in the apical segment of the right upper lobe measuring 2.8 x 4.2 cm with SUV max of 15.3. There was also hypermetabolic right supraclavicular lymph node measuring 5 mm in short axis is with SUV of 2.7 and hypermetabolic mediastinal lymph nodes with extension into the subcarinal region. There is an index mid right paratracheal lymph node measuring 1.0 cm with an SUV of 7.5 and right hilar adenopathy measuring 1.4 cm with SUV max of 10.7. There are also hypermetabolic right internal mammary nodes. There was a 4 mm low-attenuation lesion in the right hepatic lobe that is too small to characterize. On 05/07/2015 the patient underwent a video bronchoscopy, endobronchial ultrasound-guided transbronchial biopsy of the mediastinal lymph node as well as mediastinoscopy and biopsy of mediastinal lymph nodes under the care of Dr. Prescott Gum. The final cytology  (Accession: [id number]) of the fine-needle aspiration of lymph node 7 showed malignant cells consistent with non-small cell carcinoma favoring adenocarcinoma. MRI of the brain on 05/18/2015 showed no evidence of metastatic disease to the brain. Dr. Prescott Gum kindly referred the patient to me today for evaluation and treatment of her condition. When seen today the patient is feeling fine except for mid back pain towards the right. She also has sore throat and cold symptoms with postnasal drainage started recently. She denied having any significant chest pain, shortness breath, cough or hemoptysis. The patient denied having any significant weight loss or night sweats. She has no nausea or vomiting and no change in her bowel movement. She denied having any significant headache or visual changes. Her last mammogram was performed in November 2015 and was unremarkable. Family history significant for a mother and father are still alive and paternal grandmother with history of cancer as well as brother who died from small cell lung cancer at age 38. The patient is married and has 1 daughter. She was accompanied by her husband Elta Guadeloupe. She used to work as Financial planner. She has remote history of smoking for around 6 years and quit 38 years ago. She drinks alcohol occasionally and no history of drug abuse.  HPI  Past Medical History  Diagnosis Date  . Osteoporosis   . H/O cold sores   . Diverticulosis   . Rectal bleeding   . BCC (basal cell carcinoma of skin)   . right upper lobe lung mass 04/15/2015  . Back pain   . Colon polyps   . Complication of anesthesia  pt. states that she is difficult to arouse  . PONV (postoperative nausea and vomiting)   . Dysrhythmia   . Anxiety   . Complication of anesthesia   . History of anemia     Past Surgical History  Procedure Laterality Date  . Cystectomy      right breast x 2  . Colonoscopy    . Colonoscopy w/ polypectomy    . Video  bronchoscopy with endobronchial ultrasound N/A 05/07/2015    Procedure: VIDEO BRONCHOSCOPY WITH ENDOBRONCHIAL ULTRASOUND;  Surgeon: Ivin Poot, MD;  Location: [facility];  Service: Thoracic;  Laterality: N/A;  . Mediastinoscopy N/A 05/07/2015    Procedure: MEDIASTINOSCOPY;  Surgeon: Ivin Poot, MD;  Location: [facility];  Service: Thoracic;  Laterality: N/A;    History reviewed. No pertinent family history.  Social History Social History  Substance Use Topics  . Smoking status: Former Smoker -- 1.00 packs/day for 5 years    Types: Cigarettes    Quit date: 07/26/1975  . Smokeless tobacco: Never Used  . Alcohol Use: 0.0 oz/week    0 Standard drinks or equivalent per week     Comment: occ.     No Known Allergies  Current Outpatient Prescriptions  Medication Sig Dispense Refill  . Calcium Carb-Cholecalciferol (CALCIUM 1000 + D PO) Take 6 tablets by mouth daily. D3 = 200 IU per each.    . L-LYSINE PO Take 4,000 mg by mouth daily.     . Multiple Vitamins-Minerals (CENTRUM SILVER PO) Take 1 tablet by mouth daily.    . Omega-3 Fatty Acids (RA FISH OIL) 1400 MG CPDR Take 2 capsules by mouth daily.     Marland Kitchen Spirulina 500 MG TABS Take by mouth daily.    . Turmeric Curcumin 500 MG CAPS Take 2 each by mouth daily.     . vitamin B-12 (CYANOCOBALAMIN) 1000 MCG tablet Take 1,000 mcg by mouth daily.     No current facility-administered medications for this visit.    Review of Systems  Constitutional: positive for fatigue Eyes: negative Ears, nose, mouth, throat, and face: positive for sore throat and Postnasal drainage Respiratory: negative Cardiovascular: negative Gastrointestinal: negative Genitourinary:negative Integument/breast: negative Hematologic/lymphatic: negative Musculoskeletal:positive for back pain Neurological: negative Behavioral/Psych: negative Endocrine: negative Allergic/Immunologic: negative  Physical Exam  TDH:RCBUL, healthy, no distress, well nourished, well  developed and anxious SKIN: skin color, texture, turgor are normal, no rashes or significant lesions HEAD: Normocephalic, No masses, lesions, tenderness or abnormalities EYES: normal, PERRLA, Conjunctiva are pink and non-injected EARS: External ears normal, Canals clear OROPHARYNX:no exudate, no erythema and lips, buccal mucosa, and tongue normal  NECK: supple, no adenopathy, no JVD LYMPH:  no palpable lymphadenopathy, no hepatosplenomegaly BREAST:not examined LUNGS: clear to auscultation , and palpation HEART: regular rate & rhythm, no murmurs and no gallops ABDOMEN:abdomen soft, non-tender, normal bowel sounds and no masses or organomegaly BACK: Back symmetric, no curvature., No CVA tenderness EXTREMITIES:no edema, no skin discoloration, no clubbing  NEURO: alert & oriented x 3 with fluent speech, no focal motor/sensory deficits  PERFORMANCE STATUS: ECOG 1  LABORATORY DATA: Lab Results  Component Value Date   WBC 6.4 04/30/2015   HGB 14.4 04/30/2015   HCT 43.2 04/30/2015   MCV 94.3 04/30/2015   PLT 320 04/30/2015      Chemistry      Component Value Date/Time   NA 138 04/30/2015 1336   K 4.0 04/30/2015 1336   CL 103 04/30/2015 1336   CO2 28 04/30/2015 1336  BUN 10 04/30/2015 1336   CREATININE 0.76 04/30/2015 1336      Component Value Date/Time   CALCIUM 9.8 04/30/2015 1336   ALKPHOS 35* 04/30/2015 1336   AST 27 04/30/2015 1336   ALT 24 04/30/2015 1336   BILITOT 0.6 04/30/2015 1336       RADIOGRAPHIC STUDIES: Dg Chest 2 View  05/07/2015  CLINICAL DATA:  Preop chest. These results will be called to the ordering clinician or representative by the Radiology Department at the imaging location. EXAM: CHEST  2 VIEW COMPARISON:  04/03/2015 FINDINGS: Right upper lobe mass without appreciable change. Normal heart size and stable mediastinal contours. There is no edema, consolidation, effusion, or pneumothorax. No acute osseous findings. IMPRESSION: 1. No evidence of acute  cardiopulmonary disease. 2. Known right upper lobe mass. Electronically Signed   By: Monte Fantasia M.D.   On: 05/07/2015 12:34   Mr Jeri Cos BS Contrast  05/18/2015  CLINICAL DATA:  59 year old female with recently diagnosed lung cancer. Headache. Staging. Subsequent encounter. EXAM: MRI HEAD WITHOUT AND WITH CONTRAST TECHNIQUE: Multiplanar, multiecho pulse sequences of the brain and surrounding structures were obtained without and with intravenous contrast. CONTRAST:  89m MULTIHANCE GADOBENATE DIMEGLUMINE 529 MG/ML IV SOLN COMPARISON:  PET-CT 04/22/2015 FINDINGS: No abnormal enhancement identified. No midline shift, mass effect, or evidence of intracranial mass lesion. No dural thickening identified. Negative visualized cervical spinal cord. Visualized bone marrow signal is within normal limits. Major intracranial vascular flow voids are within normal limits. No restricted diffusion to suggest acute infarction. Noventriculomegaly, extra-axial collection or acute intracranial hemorrhage. Cervicomedullary junction and pituitary are within normal limits. GPearline Cablesand white matter signal is within normal limits for age throughout the brain. Visible internal auditory structures appear normal. Mastoids are clear. Trace paranasal sinus mucosal thickening. Negative orbit and scalp soft tissues. IMPRESSION: Negative brain MRI. No acute or metastatic intracranial abnormality. Electronically Signed   By: HGenevie AnnM.D.   On: 05/18/2015 18:45   Nm Pet Image Initial (pi) Skull Base To Thigh  04/22/2015  CLINICAL DATA:  Initial treatment strategy for lung mass, right upper lobe. EXAM: NUCLEAR MEDICINE PET SKULL BASE TO THIGH TECHNIQUE: 12 point to mCi F-18 FDG was injected intravenously. Full-ring PET imaging was performed from the skull base to thigh after the radiotracer. CT data was obtained and used for attenuation correction and anatomic localization. FASTING BLOOD GLUCOSE:  Value: 72 mg/dl COMPARISON:  Chest radiograph  04/03/2015. FINDINGS: NECK No hypermetabolic lymph nodes in the neck. CT images show no acute findings. CHEST Spiculated mass in the apical segment right upper lobe measures 2.8 x 4.2 cm and has an SUV max of 15.3. Greatest area of hypermetabolism is seen within a medial nodular component. Hypermetabolic right supraclavicular lymph node measures 5 mm in short axis (CT image 52) with an SUV max of 2.7. Hypermetabolic mediastinal lymph nodes extent to the subcarinal region. Index mid right paratracheal lymph node measures 10 mm (image 66) with an SUV max of 7.5. Right hilar adenopathy measures approximately 14 mm with an SUV max of 10.7. There are hypermetabolic right internal mammary nodes as well, measuring approximately 6 mm in short axis (image 73). CT images show no pericardial or pleural effusion. ABDOMEN/PELVIS No abnormal hypermetabolism in the liver, adrenal glands, spleen or pancreas. 4 mm low-attenuation lesion in the right hepatic lobe is too small to characterize. Liver, gallbladder, adrenal glands, kidneys, spleen, pancreas, stomach and bowel are grossly unremarkable. No free fluid. SKELETON No abnormal osseous hypermetabolism. IMPRESSION:  1. Hypermetabolic right upper lobe mass with hypermetabolic ipsilateral hilar, mediastinal, subcarinal, internal mammary and right supraclavicular lymph nodes. Findings are most consistent with primary bronchogenic carcinoma and are worrisome for T2a N3 M0 or stage IIIB disease. Electronically Signed   By: Lorin Picket M.D.   On: 04/22/2015 14:38    ASSESSMENT: This is a very pleasant 59 years old white female with recently diagnosed stage IIIB (T2a, N3, M0) non-small cell lung cancer, adenocarcinoma in a patient with remote history of few years of smoking and she quit 38 years ago. There was insufficient material to perform the molecular studies.  PLAN: I had a lengthy discussion with the patient and her husband about her current disease stage, prognosis and  treatment options. The patient understands that the 5 year survival for stage IIIB non-small cell lung cancer is probably in the range of 20-25%.  Unfortunately there was insufficient material to do the molecular studies. I will send a blood sample to [facility] molecular studies as well as blood test for EGFR mutation. I recommended for the patient a course of concurrent chemoradiation with weekly carboplatin for AUC of 2 and paclitaxel 45 MG/M2. I discussed with the patient adverse effect of the chemotherapy including but not limited to alopecia, myelosuppression, nausea and vomiting, peripheral neuropathy, liver or renal dysfunction. I will arrange for the patient to have a chemotherapy education class before starting the first dose of his chemotherapy. The patient will see radiation oncology in 2 days for evaluation and discussion of the radiotherapy option. I expect the patient to start the first dose of her treatment on 06/01/2015. I will call her pharmacy with prescription for Compazine 10 mg by mouth every 6 hours as needed for nausea. I also referred the patient to the dietitian at the [facility] for nutrition evaluation. She was seen during the visit today by the thoracic navigator. The patient would come back for follow-up visit on 06/08/2015 for evaluation and management of any adverse effect of her treatment. She was advised to call immediately if she has any concerning symptoms in the interval. The patient voices understanding of current disease status and treatment options and is in agreement with the current care plan.  All questions were answered. The patient knows to call the clinic with any problems, questions or concerns. We can certainly see the patient much sooner if necessary.  Thank you so much for allowing me to participate in the care of Robbi Garter. I will continue to follow up the patient with you and assist in her care.  I spent 55 minutes counseling the patient  face to face. The total time spent in the appointment was 80 minutes.  Disclaimer: This note was dictated with voice recognition software. Similar sounding words can inadvertently be transcribed and may not be corrected upon review.   Idrissa Beville K. May 19, 2015, 3:25 PM

## 2015-05-21 ENCOUNTER — Ambulatory Visit
Admission: RE | Admit: 2015-05-21 | Discharge: 2015-05-21 | Disposition: A | Discharge status: Home / Self Care | Payer: Managed Care, Other (non HMO) | Source: Ambulatory Visit | Attending: Radiation Oncology | Admitting: Radiation Oncology

## 2015-05-21 ENCOUNTER — Ambulatory Visit: Payer: Managed Care, Other (non HMO) | Attending: Radiation Oncology | Admitting: Physical Therapy

## 2015-05-21 VITALS — BP 123/81 | HR 67 | Temp 98.2°F | Resp 18 | Wt 141.9 lb

## 2015-05-21 DIAGNOSIS — C3490 Malignant neoplasm of unspecified part of unspecified bronchus or lung: Secondary | ICD-10-CM

## 2015-05-21 DIAGNOSIS — M546 Pain in thoracic spine: Secondary | ICD-10-CM | POA: Diagnosis present

## 2015-05-21 DIAGNOSIS — M545 Low back pain, unspecified: Secondary | ICD-10-CM

## 2015-05-21 DIAGNOSIS — M549 Dorsalgia, unspecified: Secondary | ICD-10-CM

## 2015-05-21 NOTE — Progress Notes (Signed)
Radiation Oncology         [phone] ________________________________  Initial Outpatient Consultation  Name: Rose Figueroa MRN: [id number]  Date: 05/21/2015  DOB: [birth year]  EH:[id number], Malka So, MD  Ivin Poot, MD   REFERRING PHYSICIAN: Prescott Gum, Collier [place], MD  DIAGNOSIS: 59 yo woman with T2aN3M0 Adenocarcinoma of the Right Upper Lung - Stage IIIB    ICD-9-CM ICD-10-CM   1. Primary lung cancer, unspecified laterality ([place]) 162.9 C34.90     HISTORY OF PRESENT ILLNESS::Rose Figueroa is a 59 y.o. female who presented to her PCP for chest tightness and shortness of breath. Cardiac workup was performed and was unremarkable. Chest x-ray on 04/03/2015 showed an abnormality in the right lung. This was followed by CT scan of the chest on 04/03/2015 at [facility] in [place], [place]. It showed a right apical mass lesion, in addition to right supraclavicular lymphadenopathy measuring 1.0 x 0.9 cm, a subcarinal lymph node measuring 2.1 x 1.3 cm, right hilar adenopathy, and other small mediastinal lymph nodes. The patient was referred to Dr. Prescott Gum and a PET scan was performed on 04/22/2015 and it showed a spiculated mass in the apical segment of the right upper lobe measuring 2.8 x 4.2 cm with an SUV max of 15.3. There was also a hypermetabolic right supraclavicular lymph node measuring 5 mm in short axis with an SUV of 2.7 and hypermetabolic mediastinal lymph nodes with extension into the subcarinal region. There is an index mid right paratracheal lymph node measuring 1 cm with an SUV of 7.5 and right hilar adenopathy measuring 1.4 cm with SUV max of 10.7. There are also hypermetabolic right internal mammary nodes. There was a 4 mm low-attenuation lesion in the right hepatic lobe that is too small to characterize.  On 05/07/2015 the patient underwent a video bronchoscopy, endobronchial ultrasound-guided transbronchial biopsy of the mediastinal lymph node as well as mediastinoscopy and  biopsy of mediastinal lymph nodes under the care of Dr. Prescott Gum. The final cytology (Accession: [id number]) of the fine-needle aspiration of lymph node 7 showed malignant cells consistent with non-small cell carcinoma favoring adenocarcinoma.  MRI of the brain on 05/18/2015 showed no evidence of metastatic disease in the brain.  Dr. Prescott Gum referred the patient to Dr. Julien Nordmann for the consideration of systemic chemotherapy on 05/19/15. Dr. Julien Nordmann has sent a blood sample to [facility] for molecular studies as well as a blood test for a EGFR mutation. He has recommended a course of concurrent chemoradiation with weekly carboplatin and paclitaxel. He has arranged for the patient to have a chemotherapy education class on 05/25/15 and expect the patient to start the first dose of her treatment on 06/01/2015. The patient is also expected to follow up with him on 06/08/2015. The patient has been referred to me today for my consideration of radiotherapy for the management of her disease.  PREVIOUS RADIATION THERAPY: No  PAST MEDICAL HISTORY:  has a past medical history of Osteoporosis; H/O cold sores; Diverticulosis; Rectal bleeding; BCC (basal cell carcinoma of skin); right upper lobe lung mass (04/15/2015); Back pain; Colon polyps; Complication of anesthesia; PONV (postoperative nausea and vomiting); Dysrhythmia; Anxiety; Complication of anesthesia; and History of anemia.    PAST SURGICAL HISTORY: Past Surgical History  Procedure Laterality Date  . Cystectomy      right breast x 2  . Colonoscopy    . Colonoscopy w/ polypectomy    . Video bronchoscopy with endobronchial ultrasound N/A 05/07/2015    Procedure: VIDEO BRONCHOSCOPY  WITH ENDOBRONCHIAL ULTRASOUND;  Surgeon: Ivin Poot, MD;  Location: [facility];  Service: Thoracic;  Laterality: N/A;  . Mediastinoscopy N/A 05/07/2015    Procedure: MEDIASTINOSCOPY;  Surgeon: Ivin Poot, MD;  Location: [facility];  Service: Thoracic;  Laterality: N/A;     FAMILY HISTORY: family history is not on file.  SOCIAL HISTORY:  Social History   Social History  . Marital Status: Married    Spouse Name: N/A  . Number of Children: N/A  . Years of Education: N/A   Occupational History  . retired    Social History Main Topics  . Smoking status: Former Smoker -- 1.00 packs/day for 5 years    Types: Cigarettes    Quit date: 07/26/1975  . Smokeless tobacco: Never Used  . Alcohol Use: 0.0 oz/week    0 Standard drinks or equivalent per week     Comment: occ.   . Drug Use: No  . Sexual Activity: Not on file   Other Topics Concern  . Not on file   Social History Narrative    ALLERGIES: Review of patient's allergies indicates no known allergies.  MEDICATIONS:  Current Outpatient Prescriptions  Medication Sig Dispense Refill  . Calcium Carb-Cholecalciferol (CALCIUM 1000 + D PO) Take 6 tablets by mouth daily. D3 = 200 IU per each.    . L-LYSINE PO Take 4,000 mg by mouth daily.     . Multiple Vitamins-Minerals (CENTRUM SILVER PO) Take 1 tablet by mouth daily.    . Omega-3 Fatty Acids (RA FISH OIL) 1400 MG CPDR Take 2 capsules by mouth daily.     Marland Kitchen Spirulina 500 MG TABS Take by mouth daily.    . Turmeric Curcumin 500 MG CAPS Take 2 each by mouth daily.     . vitamin B-12 (CYANOCOBALAMIN) 1000 MCG tablet Take 1,000 mcg by mouth daily.     No current facility-administered medications for this encounter.    REVIEW OF SYSTEMS:  A 15 point review of systems is documented in the electronic medical record. This was obtained by the nursing staff. However, I reviewed this with the patient to discuss relevant findings and make appropriate changes.  Pertinent items are noted in HPI.  PHYSICAL EXAM:  weight is 141 lb 14.4 oz (64.365 kg). Her temperature is 98.2 F (36.8 C). Her blood pressure is 123/81 and her pulse is 67. Her respiration is 18 and oxygen saturation is 98%.   Pleasant woman who appears her stated age. In no distress. Alert and  oriented x3. Normal respiratory effort. No palpable supraclavicular lymphadenopathy.  Per med-onc OAC:ZYSAY, healthy, no distress, well nourished, well developed and anxious SKIN: skin color, texture, turgor are normal, no rashes or significant lesions HEAD: Normocephalic, No masses, lesions, tenderness or abnormalities EYES: normal, PERRLA, Conjunctiva are pink and non-injected EARS: External ears normal, Canals clear OROPHARYNX:no exudate, no erythema and lips, buccal mucosa, and tongue normal  NECK: supple, no adenopathy, no JVD LYMPH:  no palpable lymphadenopathy, no hepatosplenomegaly BREAST:not examined LUNGS: clear to auscultation , and palpation HEART: regular rate & rhythm, no murmurs and no gallops ABDOMEN:abdomen soft, non-tender, normal bowel sounds and no masses or organomegaly BACK: Back symmetric, no curvature., No CVA tenderness EXTREMITIES:no edema, no skin discoloration, no clubbing  NEURO: alert & oriented x 3 with fluent speech, no focal motor/sensory deficits  KPS = 90  100 - Normal; no complaints; no evidence of disease. 90   - Able to carry on normal activity; minor signs or symptoms  of disease. 80   - Normal activity with effort; some signs or symptoms of disease. 61   - Cares for self; unable to carry on normal activity or to do active work. 60   - Requires occasional assistance, but is able to care for most of his personal needs. 50   - Requires considerable assistance and frequent medical care. 3   - Disabled; requires special care and assistance. 62   - Severely disabled; hospital admission is indicated although death not imminent. 61   - Very sick; hospital admission necessary; active supportive treatment necessary. 10   - Moribund; fatal processes progressing rapidly. 0     - Dead  Karnofsky DA, Abelmann Wade, Craver LS and [facility] [phone]) The use of the nitrogen mustards in the palliative treatment of carcinoma: with particular reference to bronchogenic  carcinoma Cancer 1 634-56  LABORATORY DATA:  Lab Results  Component Value Date   WBC 4.2 05/19/2015   HGB 13.7 05/19/2015   HCT 40.4 05/19/2015   MCV 94.4 05/19/2015   PLT 292 05/19/2015   Lab Results  Component Value Date   NA 140 05/19/2015   K 4.2 05/19/2015   CL 103 04/30/2015   CO2 27 05/19/2015   Lab Results  Component Value Date   ALT 18 05/19/2015   AST 20 05/19/2015   ALKPHOS 41 05/19/2015   BILITOT <0.30 05/19/2015     RADIOGRAPHY: Dg Chest 2 View  05/07/2015  CLINICAL DATA:  Preop chest. These results will be called to the ordering clinician or representative by the Radiology Department at the imaging location. EXAM: CHEST  2 VIEW COMPARISON:  04/03/2015 FINDINGS: Right upper lobe mass without appreciable change. Normal heart size and stable mediastinal contours. There is no edema, consolidation, effusion, or pneumothorax. No acute osseous findings. IMPRESSION: 1. No evidence of acute cardiopulmonary disease. 2. Known right upper lobe mass. Electronically Signed   By: Monte Fantasia M.D.   On: 05/07/2015 12:34   Mr Jeri Cos FA Contrast  05/18/2015  CLINICAL DATA:  59 year old female with recently diagnosed lung cancer. Headache. Staging. Subsequent encounter. EXAM: MRI HEAD WITHOUT AND WITH CONTRAST TECHNIQUE: Multiplanar, multiecho pulse sequences of the brain and surrounding structures were obtained without and with intravenous contrast. CONTRAST:  88m MULTIHANCE GADOBENATE DIMEGLUMINE 529 MG/ML IV SOLN COMPARISON:  PET-CT 04/22/2015 FINDINGS: No abnormal enhancement identified. No midline shift, mass effect, or evidence of intracranial mass lesion. No dural thickening identified. Negative visualized cervical spinal cord. Visualized bone marrow signal is within normal limits. Major intracranial vascular flow voids are within normal limits. No restricted diffusion to suggest acute infarction. Noventriculomegaly, extra-axial collection or acute intracranial hemorrhage.  Cervicomedullary junction and pituitary are within normal limits. GPearline Cablesand white matter signal is within normal limits for age throughout the brain. Visible internal auditory structures appear normal. Mastoids are clear. Trace paranasal sinus mucosal thickening. Negative orbit and scalp soft tissues. IMPRESSION: Negative brain MRI. No acute or metastatic intracranial abnormality. Electronically Signed   By: HGenevie AnnM.D.   On: 05/18/2015 18:45   Nm Pet Image Initial (pi) Skull Base To Thigh  04/22/2015  CLINICAL DATA:  Initial treatment strategy for lung mass, right upper lobe. EXAM: NUCLEAR MEDICINE PET SKULL BASE TO THIGH TECHNIQUE: 12 point to mCi F-18 FDG was injected intravenously. Full-ring PET imaging was performed from the skull base to thigh after the radiotracer. CT data was obtained and used for attenuation correction and anatomic localization. FASTING BLOOD GLUCOSE:  Value:  72 mg/dl COMPARISON:  Chest radiograph 04/03/2015. FINDINGS: NECK No hypermetabolic lymph nodes in the neck. CT images show no acute findings. CHEST Spiculated mass in the apical segment right upper lobe measures 2.8 x 4.2 cm and has an SUV max of 15.3. Greatest area of hypermetabolism is seen within a medial nodular component. Hypermetabolic right supraclavicular lymph node measures 5 mm in short axis (CT image 52) with an SUV max of 2.7. Hypermetabolic mediastinal lymph nodes extent to the subcarinal region. Index mid right paratracheal lymph node measures 10 mm (image 66) with an SUV max of 7.5. Right hilar adenopathy measures approximately 14 mm with an SUV max of 10.7. There are hypermetabolic right internal mammary nodes as well, measuring approximately 6 mm in short axis (image 73). CT images show no pericardial or pleural effusion. ABDOMEN/PELVIS No abnormal hypermetabolism in the liver, adrenal glands, spleen or pancreas. 4 mm low-attenuation lesion in the right hepatic lobe is too small to characterize. Liver, gallbladder,  adrenal glands, kidneys, spleen, pancreas, stomach and bowel are grossly unremarkable. No free fluid. SKELETON No abnormal osseous hypermetabolism. IMPRESSION: 1. Hypermetabolic right upper lobe mass with hypermetabolic ipsilateral hilar, mediastinal, subcarinal, internal mammary and right supraclavicular lymph nodes. Findings are most consistent with primary bronchogenic carcinoma and are worrisome for T2a N3 M0 or stage IIIB disease. Electronically Signed   By: Lorin Picket M.D.   On: 04/22/2015 14:38      IMPRESSION: 59 yo woman with Stage IIIB Non-Small Cell Lunf Cancer. She appears to be a good candidate for concurrent chemotherapy and radiation.  PLAN: Today, I talked to the patient and family about the findings and work-up thus far.  We discussed the natural history of locally advanced non-small cell lung cancer and general treatment, highlighting the role of radiotherapy in the management.  We discussed the available radiation techniques, and focused on the details of logistics and delivery.  We reviewed the anticipated acute and late sequelae associated with radiation in this setting.  The patient was encouraged to ask questions that I answered to the best of my ability.  I filled out a patient counseling form during our discussion including treatment diagrams.  We retained a copy for our records.  The patient would like to proceed with radiation and will be scheduled for CT simulation.  CT simulation is scheduled tomorrow at 1PM.  I spent 40 minutes minutes face to face with the patient and more than 50% of that time was spent in counseling and/or coordination of care.   This document serves as a record of services personally performed by Tyler Pita, MD. It was created on his behalf by Darcus Austin, a trained medical scribe. The creation of this record is based on the scribe's personal observations and the provider's statements to them. This document has been checked and approved by the  attending provider.     ------------------------------------------------  Sheral Apley Tammi Klippel, M.D.

## 2015-05-21 NOTE — Therapy (Signed)
[place], [place], [address] Phone: [phone]   Fax:  [phone]  Physical Therapy Evaluation  Patient Details  Name: Rose Figueroa MRN: [id number] Date of Birth: May 14, 1956 Referring Provider: Dr. Tyler Pita  Encounter Date: 05/21/2015      PT End of Session - 05/21/15 1643    Visit Number 1   Number of Visits 1   PT Start Time 1505   PT Stop Time 1528   PT Time Calculation (min) 23 min   Activity Tolerance Patient tolerated treatment well   Behavior During Therapy [facility] for tasks assessed/performed      Past Medical History  Diagnosis Date  . Osteoporosis   . H/O cold sores   . Diverticulosis   . Rectal bleeding   . BCC (basal cell carcinoma of skin)   . right upper lobe lung mass 04/15/2015  . Back pain   . Colon polyps   . Complication of anesthesia     pt. states that she is difficult to arouse  . PONV (postoperative nausea and vomiting)   . Dysrhythmia   . Anxiety   . Complication of anesthesia   . History of anemia     Past Surgical History  Procedure Laterality Date  . Cystectomy      right breast x 2  . Colonoscopy    . Colonoscopy w/ polypectomy    . Video bronchoscopy with endobronchial ultrasound N/A 05/07/2015    Procedure: VIDEO BRONCHOSCOPY WITH ENDOBRONCHIAL ULTRASOUND;  Surgeon: Ivin Poot, MD;  Location: [facility];  Service: Thoracic;  Laterality: N/A;  . Mediastinoscopy N/A 05/07/2015    Procedure: MEDIASTINOSCOPY;  Surgeon: Ivin Poot, MD;  Location: [facility] OR;  Service: Thoracic;  Laterality: N/A;    There were no vitals filed for this visit.  Visit Diagnosis:  Intermittent low back pain - Plan: PT plan of care cert/re-cert  Upper back pain on right side - Plan: PT plan of care cert/re-cert      Subjective Assessment - 05/21/15 1627    Subjective has some right shoulder area tension, sometimes back pain, but not today   Patient is accompained  by: Family member  husband   Pertinent History Pt. presented in September with c/o chest tightness and difficulty breathing.  Work up chest x-ray showed right upper lobe infiltrate.  Followed with scans, then bronchoscopy/mediastinoscopy which showed malignant cells present at lymph node 7, consistent with non-small cell carcinoma that favors adenocarcinoma.  Probably stage IIIB; expected to have chemoradiation.  Ex-smoker who quit 1977 (5 pack-years); h/o basal cell carcinoma of skin.   Patient Stated Goals get information about activities to help her do well through treatment   Currently in Pain? No/denies            [facility] PT Assessment - 05/21/15 0001    Assessment   Medical Diagnosis stage IIIB carcinoma of right upper lobe and nodes   Referring Provider Dr. Tyler Pita   Onset Date/Surgical Date [birth year]   Precautions   Precautions Other (comment)   Precaution Comments cancer precautions   Restrictions   Weight Bearing Restrictions No   Balance Screen   Has the patient fallen in the past 6 months No   Has the patient had a decrease in activity level because of a fear of falling?  No   Is the patient reluctant to leave their home because of a fear of falling?  No   Home Environment  Living Environment Private residence   Living Arrangements Spouse/significant other   Type of [place] --   Prior Function   Level of [place] gym workouts 2-3x/week for cardio and strengthening   Cognition   Overall Cognitive Status Within Functional Limits for tasks assessed   Observation/Other Assessments   Observations Well looking woman   Functional Tests   Functional tests Sit to Stand   Sit to Stand   Comments 27 times in 30 seconds (= very good)  moderate dyspnea after this   Posture/Postural Control   Posture/Postural Control Postural limitations   Postural Limitations Forward head   ROM / Strength   AROM / PROM / Strength AROM    AROM   Overall AROM Comments standing trunk AROM WFL all directions   Ambulation/Gait   Ambulation/Gait Yes   Ambulation/Gait Assistance 7: Independent   Balance   Balance Assessed Yes   Dynamic Standing Balance   Dynamic Standing - Comments reaches 15 inches forward in standing (good)                           PT Education - 05/21/15 1643    Education provided Yes   Education Details energy conservation, walking or other cardio, posture, breathing, staying active through treatment article from [employer], PT info   Person(s) Educated Patient;Spouse   Methods Explanation;Handout   Comprehension Verbalized understanding               Lung Clinic Goals - 05/21/15 1647    Patient will be able to verbalize understanding of the benefit of exercise to decrease fatigue.   Status Achieved   Patient will be able to verbalize the importance of posture.   Status Achieved   Patient will be able to demonstrate diaphragmatic breathing for improved lung function.   Status Achieved   Patient will be able to verbalize understanding of the role of physical therapy to prevent functional decline and who to contact if physical therapy is needed.   Status Achieved             Plan - 05/21/15 1644    Clinical Impression Statement This is a currently well-looking woman who leads and active lifestyle including regular 2-3/week gym workouts for both cardiovascular fitness and strengthening.  She is a stage IIIB lung cancer (probably adenocarcinoma) expecting to undergo chemoradiation.   Rehab Potential Excellent   PT Frequency One time visit   PT Treatment/Interventions Patient/family education   PT Next Visit Plan none; she is currently doing well   PT Home Exercise Plan see education section   Consulted and Agree with Plan of Care Patient         Problem List Patient Active Problem List   Diagnosis Date Noted  . Primary lung cancer ([place]) 05/19/2015  . right upper  lobe lung mass 04/15/2015    SALISBURY,DONNA 05/21/2015, 4:49 PM  [place], [place], [address] Phone: [phone]   Fax:  [phone]  Name: Rose Figueroa MRN: [id number] Date of Birth: October 13, 1955

## 2015-05-22 ENCOUNTER — Ambulatory Visit
Admission: RE | Admit: 2015-05-22 | Discharge: 2015-05-22 | Disposition: A | Discharge status: Home / Self Care | Payer: Managed Care, Other (non HMO) | Source: Ambulatory Visit | Attending: Radiation Oncology | Admitting: Radiation Oncology

## 2015-05-22 DIAGNOSIS — Z510 Encounter for antineoplastic radiation therapy: Secondary | ICD-10-CM | POA: Insufficient documentation

## 2015-05-22 DIAGNOSIS — C3411 Malignant neoplasm of upper lobe, right bronchus or lung: Secondary | ICD-10-CM | POA: Diagnosis present

## 2015-05-22 DIAGNOSIS — C3491 Malignant neoplasm of unspecified part of right bronchus or lung: Secondary | ICD-10-CM

## 2015-05-22 NOTE — Progress Notes (Addendum)
  Radiation Oncology         [phone] ________________________________  Name: Rose Figueroa MRN: [id number]  Date: 05/22/2015  DOB: Aug 04, 1955  SIMULATION AND TREATMENT PLANNING NOTE    ICD-9-CM ICD-10-CM   1. Primary lung cancer, right ([place]) 162.9 C34.91     DIAGNOSIS:  59 yo woman with T2aN3M0 Adenocarcinoma of the Right Upper Lung - Stage IIIB  NARRATIVE:  The patient was brought to the [facility].  Identity was confirmed.  All relevant records and images related to the planned course of therapy were reviewed.  The patient freely provided informed written consent to proceed with treatment after reviewing the details related to the planned course of therapy. The consent form was witnessed and verified by the simulation staff.  Then, the patient was set-up in a stable reproducible  supine position for radiation therapy.  CT images were obtained.  Surface markings were placed.  The CT images were loaded into the planning software.  Then the target and avoidance structures were contoured.  Treatment planning then occurred.  The radiation prescription was entered and confirmed.  Then, I designed and supervised the construction of a total of 6 medically necessary complex treatment devices, including a BodyFix immobilization mold custom fitted to the patient along with 5 multileaf collimators conformally shaped radiation around the treatment target while shielding critical structures such as the heart and spinal cord maximally.  I have requested : 3D Simulation  I have requested a DVH of the following structures: Left lung, right lung, spinal cord, heart, esophagus, and target.  I have ordered:Nutrition Consult  SPECIAL TREATMENT PROCEDURE:  The planned course of therapy using radiation constitutes a special treatment procedure. Special care is required in the management of this patient for the following reasons.  The patient will be receiving concurrent chemotherapy requiring  careful monitoring for increased toxicities of treatment including periodic laboratory values.  The special nature of the planned course of radiotherapy will require increased physician supervision and oversight to ensure patient's safety with optimal treatment outcomes.  PLAN:  The patient will receive 66 Gy in 33 fractions.  ________________________________  Sheral Apley Tammi Klippel, M.D.  This document serves as a record of services personally performed by Tyler Pita, MD. It was created on his behalf by Lendon Collar, a trained medical scribe. The creation of this record is based on the scribe's personal observations and the provider's statements to them. This document has been checked and approved by the attending provider.

## 2015-05-25 ENCOUNTER — Other Ambulatory Visit: Payer: Managed Care, Other (non HMO)

## 2015-05-29 ENCOUNTER — Ambulatory Visit
Admission: RE | Admit: 2015-05-29 | Discharge: 2015-05-29 | Disposition: A | Discharge status: Home / Self Care | Payer: Managed Care, Other (non HMO) | Source: Ambulatory Visit | Attending: Radiation Oncology | Admitting: Radiation Oncology

## 2015-05-29 DIAGNOSIS — C3411 Malignant neoplasm of upper lobe, right bronchus or lung: Secondary | ICD-10-CM

## 2015-05-29 DIAGNOSIS — Z510 Encounter for antineoplastic radiation therapy: Secondary | ICD-10-CM | POA: Diagnosis not present

## 2015-06-01 ENCOUNTER — Ambulatory Visit (HOSPITAL_BASED_OUTPATIENT_CLINIC_OR_DEPARTMENT_OTHER): Payer: Managed Care, Other (non HMO)

## 2015-06-01 ENCOUNTER — Ambulatory Visit: Payer: Managed Care, Other (non HMO) | Admitting: Nutrition

## 2015-06-01 ENCOUNTER — Other Ambulatory Visit (HOSPITAL_BASED_OUTPATIENT_CLINIC_OR_DEPARTMENT_OTHER): Payer: Managed Care, Other (non HMO)

## 2015-06-01 ENCOUNTER — Encounter: Payer: Self-pay | Admitting: *Deleted

## 2015-06-01 ENCOUNTER — Ambulatory Visit
Admission: RE | Admit: 2015-06-01 | Discharge: 2015-06-01 | Disposition: A | Discharge status: Home / Self Care | Payer: Managed Care, Other (non HMO) | Source: Ambulatory Visit | Attending: Radiation Oncology | Admitting: Radiation Oncology

## 2015-06-01 ENCOUNTER — Other Ambulatory Visit: Payer: Self-pay | Admitting: Internal Medicine

## 2015-06-01 VITALS — BP 116/79 | HR 56 | Temp 97.6°F | Resp 18

## 2015-06-01 DIAGNOSIS — Z5111 Encounter for antineoplastic chemotherapy: Secondary | ICD-10-CM

## 2015-06-01 DIAGNOSIS — C3411 Malignant neoplasm of upper lobe, right bronchus or lung: Secondary | ICD-10-CM

## 2015-06-01 DIAGNOSIS — R918 Other nonspecific abnormal finding of lung field: Secondary | ICD-10-CM

## 2015-06-01 DIAGNOSIS — C3491 Malignant neoplasm of unspecified part of right bronchus or lung: Secondary | ICD-10-CM

## 2015-06-01 DIAGNOSIS — Z510 Encounter for antineoplastic radiation therapy: Secondary | ICD-10-CM | POA: Diagnosis not present

## 2015-06-01 LAB — CBC WITH DIFFERENTIAL/PLATELET
BASO%: 1 % (ref 0.0–2.0)
BASOS ABS: 0 10*3/uL (ref 0.0–0.1)
EOS ABS: 0.1 10*3/uL (ref 0.0–0.5)
EOS%: 1.5 % (ref 0.0–7.0)
HEMATOCRIT: 40.9 % (ref 34.8–46.6)
HEMOGLOBIN: 13.7 g/dL (ref 11.6–15.9)
LYMPH#: 2.2 10*3/uL (ref 0.9–3.3)
LYMPH%: 43.8 % (ref 14.0–49.7)
MCH: 31.5 pg (ref 25.1–34.0)
MCHC: 33.4 g/dL (ref 31.5–36.0)
MCV: 94.3 fL (ref 79.5–101.0)
MONO#: 0.4 10*3/uL (ref 0.1–0.9)
MONO%: 7.9 % (ref 0.0–14.0)
NEUT%: 45.8 % (ref 38.4–76.8)
NEUTROS ABS: 2.3 10*3/uL (ref 1.5–6.5)
Platelets: 244 10*3/uL (ref 145–400)
RBC: 4.34 10*6/uL (ref 3.70–5.45)
RDW: 12.9 % (ref 11.2–14.5)
WBC: 4.9 10*3/uL (ref 3.9–10.3)

## 2015-06-01 LAB — COMPREHENSIVE METABOLIC PANEL (CC13)
ALBUMIN: 4 g/dL (ref 3.5–5.0)
ALK PHOS: 36 U/L — AB (ref 40–150)
ALT: 17 U/L (ref 0–55)
AST: 20 U/L (ref 5–34)
Anion Gap: 8 mEq/L (ref 3–11)
BILIRUBIN TOTAL: 0.41 mg/dL (ref 0.20–1.20)
BUN: 10.8 mg/dL (ref 7.0–26.0)
CALCIUM: 9.5 mg/dL (ref 8.4–10.4)
CO2: 23 mEq/L (ref 22–29)
Chloride: 109 mEq/L (ref 98–109)
Creatinine: 0.7 mg/dL (ref 0.6–1.1)
EGFR: 90 mL/min/{1.73_m2} — AB (ref >90)
GLUCOSE: 96 mg/dL (ref 70–140)
POTASSIUM: 4.3 meq/L (ref 3.5–5.1)
SODIUM: 140 meq/L (ref 136–145)
TOTAL PROTEIN: 6.3 g/dL — AB (ref 6.4–8.3)

## 2015-06-01 MED ORDER — DIPHENHYDRAMINE HCL 50 MG/ML IJ SOLN
50.0000 mg | Freq: Once | INTRAMUSCULAR | Status: AC
  Administered 2015-06-01: 50 mg via INTRAVENOUS

## 2015-06-01 MED ORDER — FAMOTIDINE IN NACL 20-0.9 MG/50ML-% IV SOLN
20.0000 mg | Freq: Once | INTRAVENOUS | Status: AC
  Administered 2015-06-01: 20 mg via INTRAVENOUS

## 2015-06-01 MED ORDER — SODIUM CHLORIDE 0.9 % IV SOLN
Freq: Once | INTRAVENOUS | Status: AC
  Administered 2015-06-01: 13:00:00 via INTRAVENOUS
  Filled 2015-06-01: qty 8

## 2015-06-01 MED ORDER — FAMOTIDINE IN NACL 20-0.9 MG/50ML-% IV SOLN
INTRAVENOUS | Status: AC
  Filled 2015-06-01: qty 50

## 2015-06-01 MED ORDER — SODIUM CHLORIDE 0.9 % IV SOLN
Freq: Once | INTRAVENOUS | Status: AC
  Administered 2015-06-01: 12:00:00 via INTRAVENOUS

## 2015-06-01 MED ORDER — DIPHENHYDRAMINE HCL 50 MG/ML IJ SOLN
INTRAMUSCULAR | Status: AC
  Filled 2015-06-01: qty 1

## 2015-06-01 MED ORDER — SODIUM CHLORIDE 0.9 % IV SOLN
204.4000 mg | Freq: Once | INTRAVENOUS | Status: AC
  Administered 2015-06-01: 200 mg via INTRAVENOUS
  Filled 2015-06-01: qty 20

## 2015-06-01 MED ORDER — PROCHLORPERAZINE MALEATE 10 MG PO TABS: 10.0000 mg | ORAL_TABLET | Freq: Four times a day (QID) | ORAL | Status: DC | PRN

## 2015-06-01 MED ORDER — PACLITAXEL CHEMO INJECTION 300 MG/50ML
45.0000 mg/m2 | Freq: Once | INTRAVENOUS | Status: AC
  Administered 2015-06-01: 78 mg via INTRAVENOUS
  Filled 2015-06-01: qty 13

## 2015-06-01 NOTE — Patient Instructions (Signed)
[facility] Discharge Instructions for Patients Receiving Chemotherapy  Today you received the following chemotherapy agents Taxol and Carboplatin. To help prevent nausea and vomiting after your treatment, we encourage you to take your nausea medication as directed.  If you develop nausea and vomiting that is not controlled by your nausea medication, call the clinic.   BELOW ARE SYMPTOMS THAT SHOULD BE REPORTED IMMEDIATELY:  *FEVER GREATER THAN 100.5 F  *CHILLS WITH OR WITHOUT FEVER  NAUSEA AND VOMITING THAT IS NOT CONTROLLED WITH YOUR NAUSEA MEDICATION  *UNUSUAL SHORTNESS OF BREATH  *UNUSUAL BRUISING OR BLEEDING  TENDERNESS IN MOUTH AND THROAT WITH OR WITHOUT PRESENCE OF ULCERS  *URINARY PROBLEMS  *BOWEL PROBLEMS  UNUSUAL RASH Items with * indicate a potential emergency and should be followed up as soon as possible.  Feel free to call the clinic you have any questions or concerns. The clinic phone number is [phone].  Please show the CHEMO ALERT CARD at check-in to the Emergency Department and triage nurse.    

## 2015-06-01 NOTE — Progress Notes (Signed)
59 year old female diagnosed with stage IIIB lung cancer receiving both chemotherapy and radiation treatments.  Past medical history includes osteoporosis, diverticulosis, and anxiety.  Medications include calcium and vitamin D, multivitamin, omega-3 fatty acids, turmeric, vitamin B12 and spirulina.  Labs were reviewed.  Height: 63 inches. Weight: 141.9 pounds October 27. Usual body weight: 145 pounds. BMI: 25.14.  Patient had requested review of vitamin, mineral supplements. Pharmacist had reviewed current medications/vitamin, mineral supplements and stated she saw no interactions. Patient states she tries to eat a healthy diet She has reduced concentrated forms of sugar and has started juicing every day.  Nutrition diagnosis:  Food and nutrition related knowledge deficit related to new diagnosis of lung cancer and associated treatments as evidenced by no prior need for nutrition related information.  Intervention:  Provided support and encouragement for patient to continue consuming a healthy plant-based diet. Reviewed protein sources with patient and encouraged patient consume increased protein foods. Encouraged patient to limit juicing to once daily. Educated patient on the importance of including a variety of fruits and vegetables daily. Questions were answered.  Teach back method was used.  Contact information was given.  Monitoring, evaluation, goals: Patient will tolerate a healthy plant-based diet for minimal weight loss throughout treatment.  Next visit: Monday, November 21 during infusion.  **Disclaimer: This note was dictated with voice recognition software. Similar sounding words can inadvertently be transcribed and this note may contain transcription errors which may not have been corrected upon publication of note.**

## 2015-06-01 NOTE — Progress Notes (Signed)
Oncology Nurse Navigator Documentation  Oncology Nurse Navigator Flowsheets 06/01/2015  Navigator Encounter Type Treatment/I spoke with patient today during her first chemo tx.  I reminded her of possible side effects of chemotherapy.  Patient was in need of anti-nausea medication.  Prescription e-prescribed.  I notified patient medication will be at pharmacy.  She was thankful for the help.   Patient Visit Type Medonc  Treatment Phase First Chemo Tx  Barriers/Navigation Needs Education  Education Other  Interventions Coordination of Care  Time Spent with Patient 30

## 2015-06-02 ENCOUNTER — Ambulatory Visit
Admission: RE | Admit: 2015-06-02 | Discharge: 2015-06-02 | Disposition: A | Discharge status: Home / Self Care | Payer: Managed Care, Other (non HMO) | Source: Ambulatory Visit | Attending: Radiation Oncology | Admitting: Radiation Oncology

## 2015-06-02 DIAGNOSIS — Z510 Encounter for antineoplastic radiation therapy: Secondary | ICD-10-CM | POA: Diagnosis not present

## 2015-06-03 ENCOUNTER — Ambulatory Visit
Admission: RE | Admit: 2015-06-03 | Discharge: 2015-06-03 | Disposition: A | Discharge status: Home / Self Care | Payer: Managed Care, Other (non HMO) | Source: Ambulatory Visit | Attending: Radiation Oncology | Admitting: Radiation Oncology

## 2015-06-03 DIAGNOSIS — Z510 Encounter for antineoplastic radiation therapy: Secondary | ICD-10-CM | POA: Diagnosis not present

## 2015-06-04 ENCOUNTER — Encounter: Payer: Self-pay | Admitting: Radiation Oncology

## 2015-06-04 ENCOUNTER — Ambulatory Visit
Admission: RE | Admit: 2015-06-04 | Discharge: 2015-06-04 | Disposition: A | Discharge status: Home / Self Care | Payer: Managed Care, Other (non HMO) | Source: Ambulatory Visit | Attending: Radiation Oncology | Admitting: Radiation Oncology

## 2015-06-04 VITALS — BP 125/93 | HR 76 | Resp 16 | Wt 143.0 lb

## 2015-06-04 DIAGNOSIS — Z510 Encounter for antineoplastic radiation therapy: Secondary | ICD-10-CM | POA: Diagnosis not present

## 2015-06-04 DIAGNOSIS — C3411 Malignant neoplasm of upper lobe, right bronchus or lung: Secondary | ICD-10-CM

## 2015-06-04 NOTE — Progress Notes (Addendum)
Weight stable. Diastolic bp slightly elevated. Denies pain. Reports difficulty sleeping despite taking OTC melatonin. Explains she has difficulty calming her busy mind. Reports new onset of dry cough and congestion this morning. Reports her sister who was visiting with her now has a cold. Reports heaviness in her chest today and yesterday. Denies SOB. Denies painful or difficult swallowing. No skin changes noted within treatment field. Oriented patient to staff and routine of the clinic. Provided patient with RADIATION THERAPY AND YOU handbook then, reviewed pertinent information. Educated patient reference potential side effects and management such as fatigue, skin changes, and throat changes. Provided patient with radiaplex and directed upon use. Afforded patient the opportunity to ask questions and answered those to the best of my ability. Patient verbalized understanding of all reviewed.   BP 125/93 mmHg  Pulse 76  Resp 16  Wt 143 lb (64.864 kg)  SpO2 100% Wt Readings from Last 3 Encounters:  06/04/15 143 lb (64.864 kg)  05/21/15 141 lb 14.4 oz (64.365 kg)  05/19/15 142 lb 8 oz (64.638 kg)

## 2015-06-04 NOTE — Progress Notes (Signed)
  Radiation Oncology         [phone]   Name: Rose Figueroa MRN: [id number]   Date: 06/04/2015  DOB: 1955-09-15     Weekly Radiation Therapy Management    ICD-9-CM ICD-10-CM   1. Cancer of upper lobe of right lung (HCC) 162.3 C34.11     Current Dose: 8 Gy  Planned Dose:  66 Gy  Narrative The patient presents for routine under treatment assessment.  Weight stable. Diastolic bp slightly elevated. Denies pain. Reports difficulty sleeping despite taking OTC melatonin. Explains she has difficulty calming her busy mind. Reports new onset of dry cough and congestion this morning. Reports her sister who was visiting with her now has a cold. Reports heaviness in her chest today and yesterday. Denies SOB. Denies painful or difficult swallowing. No skin changes noted within treatment field.  The patient is without complaint. Set-up films were reviewed. The chart was checked.  Physical Findings  weight is 143 lb (64.864 kg). Her blood pressure is 125/93 and her pulse is 76. Her respiration is 16 and oxygen saturation is 100%. . Weight essentially stable.  No significant changes.  Impression The patient is tolerating radiation.  Plan Continue treatment as planned.      Sheral Apley Tammi Klippel, M.D.  This document serves as a record of services personally performed by Tyler Pita, MD. It was created on his behalf by Arlyce Harman, a trained medical scribe. The creation of this record is based on the scribe's personal observations and the provider's statements to them. This document has been checked and approved by the attending provider.

## 2015-06-05 ENCOUNTER — Ambulatory Visit
Admission: RE | Admit: 2015-06-05 | Discharge: 2015-06-05 | Disposition: A | Discharge status: Home / Self Care | Payer: Managed Care, Other (non HMO) | Source: Ambulatory Visit | Attending: Radiation Oncology | Admitting: Radiation Oncology

## 2015-06-05 ENCOUNTER — Other Ambulatory Visit: Payer: Self-pay | Admitting: *Deleted

## 2015-06-05 DIAGNOSIS — Z510 Encounter for antineoplastic radiation therapy: Secondary | ICD-10-CM | POA: Diagnosis not present

## 2015-06-05 DIAGNOSIS — R918 Other nonspecific abnormal finding of lung field: Secondary | ICD-10-CM

## 2015-06-05 DIAGNOSIS — C3411 Malignant neoplasm of upper lobe, right bronchus or lung: Secondary | ICD-10-CM

## 2015-06-08 ENCOUNTER — Telehealth: Payer: Self-pay | Admitting: Internal Medicine

## 2015-06-08 ENCOUNTER — Ambulatory Visit (HOSPITAL_BASED_OUTPATIENT_CLINIC_OR_DEPARTMENT_OTHER): Payer: Managed Care, Other (non HMO)

## 2015-06-08 ENCOUNTER — Ambulatory Visit
Admission: RE | Admit: 2015-06-08 | Discharge: 2015-06-08 | Disposition: A | Discharge status: Home / Self Care | Payer: Managed Care, Other (non HMO) | Source: Ambulatory Visit | Attending: Radiation Oncology | Admitting: Radiation Oncology

## 2015-06-08 ENCOUNTER — Other Ambulatory Visit (HOSPITAL_BASED_OUTPATIENT_CLINIC_OR_DEPARTMENT_OTHER): Payer: Managed Care, Other (non HMO)

## 2015-06-08 ENCOUNTER — Encounter: Payer: Self-pay | Admitting: *Deleted

## 2015-06-08 ENCOUNTER — Ambulatory Visit (HOSPITAL_BASED_OUTPATIENT_CLINIC_OR_DEPARTMENT_OTHER): Payer: Managed Care, Other (non HMO) | Admitting: Physician Assistant

## 2015-06-08 VITALS — BP 104/68 | HR 63

## 2015-06-08 VITALS — BP 121/83 | HR 71 | Temp 97.8°F | Resp 20 | Ht 63.0 in | Wt 141.5 lb

## 2015-06-08 DIAGNOSIS — G609 Hereditary and idiopathic neuropathy, unspecified: Secondary | ICD-10-CM

## 2015-06-08 DIAGNOSIS — C3491 Malignant neoplasm of unspecified part of right bronchus or lung: Secondary | ICD-10-CM

## 2015-06-08 DIAGNOSIS — G4700 Insomnia, unspecified: Secondary | ICD-10-CM | POA: Diagnosis not present

## 2015-06-08 DIAGNOSIS — R918 Other nonspecific abnormal finding of lung field: Secondary | ICD-10-CM

## 2015-06-08 DIAGNOSIS — Z5111 Encounter for antineoplastic chemotherapy: Secondary | ICD-10-CM

## 2015-06-08 DIAGNOSIS — Z510 Encounter for antineoplastic radiation therapy: Secondary | ICD-10-CM | POA: Diagnosis not present

## 2015-06-08 DIAGNOSIS — C3411 Malignant neoplasm of upper lobe, right bronchus or lung: Secondary | ICD-10-CM

## 2015-06-08 HISTORY — DX: Encounter for antineoplastic chemotherapy: Z51.11

## 2015-06-08 LAB — COMPREHENSIVE METABOLIC PANEL (CC13)
ALBUMIN: 3.9 g/dL (ref 3.5–5.0)
ALK PHOS: 34 U/L — AB (ref 40–150)
ALT: 19 U/L (ref 0–55)
AST: 20 U/L (ref 5–34)
Anion Gap: 9 mEq/L (ref 3–11)
BILIRUBIN TOTAL: 0.34 mg/dL (ref 0.20–1.20)
BUN: 12.1 mg/dL (ref 7.0–26.0)
CALCIUM: 9.8 mg/dL (ref 8.4–10.4)
CO2: 24 mEq/L (ref 22–29)
Chloride: 108 mEq/L (ref 98–109)
Creatinine: 0.7 mg/dL (ref 0.6–1.1)
EGFR: >90 mL/min/{1.73_m2} (ref >90)
GLUCOSE: 91 mg/dL (ref 70–140)
Potassium: 4 mEq/L (ref 3.5–5.1)
SODIUM: 140 meq/L (ref 136–145)
TOTAL PROTEIN: 6.4 g/dL (ref 6.4–8.3)

## 2015-06-08 LAB — CBC WITH DIFFERENTIAL/PLATELET
BASO%: 0.5 % (ref 0.0–2.0)
BASOS ABS: 0 10*3/uL (ref 0.0–0.1)
EOS%: 2.4 % (ref 0.0–7.0)
Eosinophils Absolute: 0.1 10*3/uL (ref 0.0–0.5)
HEMATOCRIT: 41.1 % (ref 34.8–46.6)
HEMOGLOBIN: 13.8 g/dL (ref 11.6–15.9)
LYMPH#: 1.1 10*3/uL (ref 0.9–3.3)
LYMPH%: 28.9 % (ref 14.0–49.7)
MCH: 31.6 pg (ref 25.1–34.0)
MCHC: 33.6 g/dL (ref 31.5–36.0)
MCV: 94.1 fL (ref 79.5–101.0)
MONO#: 0.2 10*3/uL (ref 0.1–0.9)
MONO%: 6.4 % (ref 0.0–14.0)
NEUT#: 2.3 10*3/uL (ref 1.5–6.5)
NEUT%: 61.8 % (ref 38.4–76.8)
NRBC: 0 % (ref 0–0)
Platelets: 168 10*3/uL (ref 145–400)
RBC: 4.37 10*6/uL (ref 3.70–5.45)
RDW: 12.7 % (ref 11.2–14.5)
WBC: 3.8 10*3/uL — ABNORMAL LOW (ref 3.9–10.3)

## 2015-06-08 MED ORDER — SODIUM CHLORIDE 0.9 % IV SOLN
Freq: Once | INTRAVENOUS | Status: AC
  Administered 2015-06-08: 13:00:00 via INTRAVENOUS

## 2015-06-08 MED ORDER — SODIUM CHLORIDE 0.9 % IV SOLN
204.4000 mg | Freq: Once | INTRAVENOUS | Status: AC
  Administered 2015-06-08: 200 mg via INTRAVENOUS
  Filled 2015-06-08: qty 20

## 2015-06-08 MED ORDER — FAMOTIDINE IN NACL 20-0.9 MG/50ML-% IV SOLN
INTRAVENOUS | Status: AC
  Filled 2015-06-08: qty 50

## 2015-06-08 MED ORDER — DEXTROSE 5 % IV SOLN
45.0000 mg/m2 | Freq: Once | INTRAVENOUS | Status: AC
  Administered 2015-06-08: 78 mg via INTRAVENOUS
  Filled 2015-06-08: qty 13

## 2015-06-08 MED ORDER — SODIUM CHLORIDE 0.9 % IV SOLN
Freq: Once | INTRAVENOUS | Status: AC
  Administered 2015-06-08: 13:00:00 via INTRAVENOUS
  Filled 2015-06-08: qty 8

## 2015-06-08 MED ORDER — FAMOTIDINE IN NACL 20-0.9 MG/50ML-% IV SOLN
20.0000 mg | Freq: Once | INTRAVENOUS | Status: AC
  Administered 2015-06-08: 20 mg via INTRAVENOUS

## 2015-06-08 MED ORDER — DIPHENHYDRAMINE HCL 50 MG/ML IJ SOLN
INTRAMUSCULAR | Status: AC
  Filled 2015-06-08: qty 1

## 2015-06-08 MED ORDER — DIPHENHYDRAMINE HCL 50 MG/ML IJ SOLN
50.0000 mg | Freq: Once | INTRAMUSCULAR | Status: AC
  Administered 2015-06-08: 25 mg via INTRAVENOUS

## 2015-06-08 NOTE — Progress Notes (Signed)
Hematology and Oncology Follow Up Visit  Rose Figueroa [id number] April 19, 1956 59 y.o. 06/08/2015 10:46 AM   Interim History:  Rose Figueroa returns for a follow up prior to her next cycle 2 of chemotherapy. She is tolerating it well with the exception of very mild neuropathy and minimal hair loss. Denies fevers, chills, night sweats, vision changes, or mucositis. Denies any respiratory complaints. Denies any chest pain or palpitations. Denies lower extremity swelling. Denies nausea, heartburn or change in bowel habits. Appetite is normal. Denies any dysuria. Denies any bleeding issues such as epistaxis, hematemesis, hematuria or hematochezia. Ambulating without difficulty. She has insomnia controlled with "simply sleep" by Tylenol.  She is here for day 1cycle 2 chemotherapy with Carboplatin and Taxol  DIAGNOSIS: 59 yo woman with T2aN3M0 Adenocarcinoma of the Right Upper Lung - Stage 3 B                 A blood sample was sent on 05/19/15  to [facility] molecular studies as well as blood test for EGFR mutation. Results were negative for EGFR mutation and ALK gene translocation   Prior therapy: None  Current therapy: concurrent chemotherapy withwith weekly carboplatin for AUC of 2 and paclitaxel 45 MG/M2, first dose of her treatment on 06/01/2015, and radiation, to receive total of 66 Gy in 33 fractions  Medications: were reviewed and updated   Allergies: No Known Allergies  Past Medical History, Surgical history, Social history, and Family History were reviewed and updated.  Review of Systems: Positive for mild neuropathy, minimal alopecia and insomnia ROS  Remaining ROS negative.  Physical Exam: There were no vitals taken for this visit.  Physical Exam  LABS: CBC Latest Ref Rng 06/01/2015 05/19/2015 04/30/2015  WBC 3.9 - 10.3 10e3/uL 4.9 4.2 6.4  Hemoglobin 11.6 - 15.9 g/dL 13.7 13.7 14.4  Hematocrit 34.8 - 46.6 % 40.9 40.4 43.2  Platelets 145 - 400 10e3/uL 244 292 320    CMP Latest Ref Rng 06/01/2015 05/19/2015 04/30/2015  Glucose 70 - 140 mg/dl 96 111 96  BUN 7.0 - 26.0 mg/dL 10.8 9.3 10  Creatinine 0.6 - 1.1 mg/dL 0.7 0.7 0.76  Sodium 136 - 145 mEq/L 140 140 138  Potassium 3.5 - 5.1 mEq/L 4.3 4.2 4.0  Chloride 101 - 111 mmol/L - - 103  CO2 22 - 29 mEq/L _0 Calcium 8.4 - 10.4 mg/dL 9.5 9.6 9.8  Total Protein 6.4 - 8.3 g/dL 6.3(L) 6.4 6.7  Total Bilirubin 0.20 - 1.20 mg/dL 0.41 <0.30 0.6  Alkaline Phos 40 - 150 U/L 36(L) 41 35(L)  AST 5 - 34 U/L _1 ALT 0 - 55 U/L _2 Radiological Studies: Mr Kizzie Fantasia Contrast  2015/05/28  CLINICAL DATA:  59 year old female with recently diagnosed lung cancer. Headache. Staging. Subsequent encounter. EXAM: MRI HEAD WITHOUT AND WITH CONTRAST TECHNIQUE: Multiplanar, multiecho pulse sequences of the brain and surrounding structures were obtained without and with intravenous contrast. CONTRAST:  9m MULTIHANCE GADOBENATE DIMEGLUMINE 529 MG/ML IV SOLN COMPARISON:  PET-CT 04/22/2015 FINDINGS: No abnormal enhancement identified. No midline shift, mass effect, or evidence of intracranial mass lesion. No dural thickening identified. Negative visualized cervical spinal cord. Visualized bone marrow signal is within normal limits. Major intracranial vascular flow voids are within normal limits. No restricted diffusion to suggest acute infarction. Noventriculomegaly, extra-axial collection or acute intracranial hemorrhage. Cervicomedullary junction and pituitary are within normal limits. GPearline Cablesand white matter signal is within normal limits  for age throughout the brain. Visible internal auditory structures appear normal. Mastoids are clear. Trace paranasal sinus mucosal thickening. Negative orbit and scalp soft tissues. IMPRESSION: Negative brain MRI. No acute or metastatic intracranial abnormality. Electronically Signed   By: Rose Figueroa M.D.   On: 05/18/2015 18:45    Impression and Plan: This is a very pleasant 59  years old white female with recently diagnosed stage IIIB (T2a, N3, M0) non-small cell lung cancer, adenocarcinoma in a patient with remote history of few years of smoking and she quit 38 years ago. She was placed on  concurrent chemotherapy withwith weekly carboplatin for AUC of 2 and paclitaxel 45 MG/M2, first dose of her treatment on 06/01/2015, and radiation, to receive total of 66 Gy in 33 fractions A blood sample was sent to [facility] molecular studies as well as blood test for EGFR mutation. Results were negative for EGFR mutation and ALK gene translocation Will continue with the current treatment and radiation She will follow up with Dr. Julien Nordmann as scheduled. At some point she will undergo staging scans to evaluate response to therapy   This is a shared visit. Plan discussed with Dr. Julien Nordmann.    Rose Jumbo, PA-C 11/14/201610:46 AM  ADDENDUM: Hematology/Oncology Attending: I had a face to face encounter with the patient today. I recommended her care plan. This is a very pleasant 59 years old white female with a stage IIIB non-small cell lung cancer, adenocarcinoma with negative EGFR mutation and negative for gene translocation. The patient is currently undergoing a course of concurrent chemoradiation with weekly carboplatin and paclitaxel is status post 1 cycle. She is tolerating her treatment fairly well with no significant adverse effects. The blood tests for EGFR mutation as well as [facility] showed no actionablel mutations. I recommended for the patient to continue her current treatment with concurrent chemoradiation as scheduled. She will come back for follow-up visit in 2 weeks for reevaluation and management of any adverse effect of her treatment. The patient was advised to call immediately if she has any concerning symptoms in the interval.  Disclaimer: This note was dictated with voice recognition software. Similar sounding words can inadvertently be transcribed and may  be missed upon review. Rose Figueroa., MD 06/08/2015

## 2015-06-08 NOTE — Telephone Encounter (Signed)
Gave and printed new avs

## 2015-06-08 NOTE — Progress Notes (Signed)
IV stick x 6 by 3 RNs.  Pt became light headed after last stick.  Her BP was low,  See flowsheet.   Pt recovered after and BP up after about 10 minutes.   Pt also reports she felt very "woozy" and light headed w/ IV Benadryl 50 mg on last treatment.   Notified Dr. Julien Nordmann of above and he ordered ok to give Benadryl 25 mg IV today.  He also recommended discussing PAC placement w/ pt.Marland Kitchen  PAC discussed and pt is undecided.  Instructed her to notify Dr. Worthy Flank nurse if she decides she wants one prior to next treatment.  She verbalized understanding.

## 2015-06-08 NOTE — Progress Notes (Signed)
Oncology Nurse Navigator Documentation  Oncology Nurse Navigator Flowsheets 06/08/2015  Navigator Encounter Type Treatment/spoke with patient and husband today at [facility] during her tx.  She stated she is doing well. No barriers identified.   Patient Visit Type Medonc  Treatment Phase Treatment  Barriers/Navigation Needs No barriers at this time  Time Spent with Patient 15

## 2015-06-08 NOTE — Patient Instructions (Signed)
[facility] Discharge Instructions for Patients Receiving Chemotherapy  Today you received the following chemotherapy agents Taxol and Carboplatin. To help prevent nausea and vomiting after your treatment, we encourage you to take your nausea medication as directed.  If you develop nausea and vomiting that is not controlled by your nausea medication, call the clinic.   BELOW ARE SYMPTOMS THAT SHOULD BE REPORTED IMMEDIATELY:  *FEVER GREATER THAN 100.5 F  *CHILLS WITH OR WITHOUT FEVER  NAUSEA AND VOMITING THAT IS NOT CONTROLLED WITH YOUR NAUSEA MEDICATION  *UNUSUAL SHORTNESS OF BREATH  *UNUSUAL BRUISING OR BLEEDING  TENDERNESS IN MOUTH AND THROAT WITH OR WITHOUT PRESENCE OF ULCERS  *URINARY PROBLEMS  *BOWEL PROBLEMS  UNUSUAL RASH Items with * indicate a potential emergency and should be followed up as soon as possible.  Feel free to call the clinic you have any questions or concerns. The clinic phone number is [phone].  Please show the CHEMO ALERT CARD at check-in to the Emergency Department and triage nurse.    

## 2015-06-09 ENCOUNTER — Ambulatory Visit
Admission: RE | Admit: 2015-06-09 | Discharge: 2015-06-09 | Disposition: A | Discharge status: Home / Self Care | Payer: Managed Care, Other (non HMO) | Source: Ambulatory Visit | Attending: Radiation Oncology | Admitting: Radiation Oncology

## 2015-06-09 DIAGNOSIS — Z510 Encounter for antineoplastic radiation therapy: Secondary | ICD-10-CM | POA: Diagnosis not present

## 2015-06-10 ENCOUNTER — Ambulatory Visit
Admission: RE | Admit: 2015-06-10 | Discharge: 2015-06-10 | Disposition: A | Discharge status: Home / Self Care | Payer: Managed Care, Other (non HMO) | Source: Ambulatory Visit | Attending: Radiation Oncology | Admitting: Radiation Oncology

## 2015-06-10 DIAGNOSIS — Z510 Encounter for antineoplastic radiation therapy: Secondary | ICD-10-CM | POA: Diagnosis not present

## 2015-06-10 LAB — GUARDANT 360

## 2015-06-11 ENCOUNTER — Telehealth: Payer: Self-pay | Admitting: *Deleted

## 2015-06-11 ENCOUNTER — Ambulatory Visit
Admission: RE | Admit: 2015-06-11 | Discharge: 2015-06-11 | Disposition: A | Discharge status: Home / Self Care | Payer: Managed Care, Other (non HMO) | Source: Ambulatory Visit | Attending: Radiation Oncology | Admitting: Radiation Oncology

## 2015-06-11 DIAGNOSIS — Z510 Encounter for antineoplastic radiation therapy: Secondary | ICD-10-CM | POA: Diagnosis not present

## 2015-06-11 NOTE — Telephone Encounter (Signed)
Call from [place], pt is asking if her PAC could be done prior to Monday. Called pt unable to reach, lm for pt to call back. No orders in system for [facility] placement. Request pt call back to discuss.

## 2015-06-12 ENCOUNTER — Ambulatory Visit
Admission: RE | Admit: 2015-06-12 | Discharge: 2015-06-12 | Disposition: A | Discharge status: Home / Self Care | Payer: Managed Care, Other (non HMO) | Source: Ambulatory Visit | Attending: Radiation Oncology | Admitting: Radiation Oncology

## 2015-06-12 ENCOUNTER — Other Ambulatory Visit: Payer: Self-pay | Admitting: Medical Oncology

## 2015-06-12 DIAGNOSIS — C3411 Malignant neoplasm of upper lobe, right bronchus or lung: Secondary | ICD-10-CM

## 2015-06-12 DIAGNOSIS — Z510 Encounter for antineoplastic radiation therapy: Secondary | ICD-10-CM | POA: Diagnosis not present

## 2015-06-12 MED ORDER — SUCRALFATE 1 GM/10ML PO SUSP: 1.0000 g | Freq: Three times a day (TID) | ORAL | Status: DC

## 2015-06-12 NOTE — Progress Notes (Signed)
  Radiation Oncology         [phone]   Name: Rose Figueroa MRN: [id number]   Date: 06/12/2015  DOB: December 28, 1955     Weekly Radiation Therapy Management    ICD-9-CM ICD-10-CM   1. Cancer of upper lobe of right lung (HCC) 162.3 C34.11     Current Dose: Reviewed Planned Dose:  66 Gy  Narrative The patient presents for routine under treatment assessment.  Reports constipation but she attributes this to chemotherapy. Reports a "lump in her throat" that causes discomfort while drinking. She is concerned about her white blood cell count decreasing. She has been taking Simply Sleep over the counter medication to sleep more soundly. Reports having a rash, described as little bumps, over her stomach and chest last week with no itching. Reports intermittently feeling hot but not running a fever. She continues to stay active by biking and walking frequently.   The patient is without complaint. Set-up films were reviewed. The chart was checked.  Physical Findings  vitals were not taken for this visit.. Weight essentially stable at 141.7 lbs.  No significant changes.  Impression The patient is tolerating radiation.  Plan Continue treatment as planned. I have written a prescription for carafate to alleviate her throat discomfort. We discussed that her decreasing WBC is most likely chemotherapy related.      Sheral Apley Tammi Klippel, M.D.  This document serves as a record of services personally performed by Tyler Pita, MD. It was created on his behalf by Arlyce Harman, a trained medical scribe. The creation of this record is based on the scribe's personal observations and the provider's statements to them. This document has been checked and approved by the attending provider.

## 2015-06-14 ENCOUNTER — Ambulatory Visit
Admission: RE | Admit: 2015-06-14 | Discharge: 2015-06-14 | Disposition: A | Discharge status: Home / Self Care | Payer: Managed Care, Other (non HMO) | Source: Ambulatory Visit | Attending: Radiation Oncology | Admitting: Radiation Oncology

## 2015-06-14 DIAGNOSIS — Z510 Encounter for antineoplastic radiation therapy: Secondary | ICD-10-CM | POA: Diagnosis not present

## 2015-06-15 ENCOUNTER — Ambulatory Visit
Admission: RE | Admit: 2015-06-15 | Discharge: 2015-06-15 | Disposition: A | Discharge status: Home / Self Care | Payer: Managed Care, Other (non HMO) | Source: Ambulatory Visit | Attending: Radiation Oncology | Admitting: Radiation Oncology

## 2015-06-15 ENCOUNTER — Ambulatory Visit: Payer: Managed Care, Other (non HMO) | Admitting: Nutrition

## 2015-06-15 ENCOUNTER — Ambulatory Visit (HOSPITAL_BASED_OUTPATIENT_CLINIC_OR_DEPARTMENT_OTHER): Payer: Managed Care, Other (non HMO)

## 2015-06-15 ENCOUNTER — Other Ambulatory Visit (HOSPITAL_BASED_OUTPATIENT_CLINIC_OR_DEPARTMENT_OTHER): Payer: Managed Care, Other (non HMO)

## 2015-06-15 VITALS — BP 134/89 | HR 70 | Temp 98.4°F | Resp 20

## 2015-06-15 DIAGNOSIS — C3491 Malignant neoplasm of unspecified part of right bronchus or lung: Secondary | ICD-10-CM

## 2015-06-15 DIAGNOSIS — Z510 Encounter for antineoplastic radiation therapy: Secondary | ICD-10-CM | POA: Diagnosis not present

## 2015-06-15 DIAGNOSIS — Z5111 Encounter for antineoplastic chemotherapy: Secondary | ICD-10-CM

## 2015-06-15 DIAGNOSIS — C3411 Malignant neoplasm of upper lobe, right bronchus or lung: Secondary | ICD-10-CM

## 2015-06-15 LAB — CBC WITH DIFFERENTIAL/PLATELET
BASO%: 0.6 % (ref 0.0–2.0)
Basophils Absolute: 0 10*3/uL (ref 0.0–0.1)
EOS%: 1.5 % (ref 0.0–7.0)
Eosinophils Absolute: 0.1 10*3/uL (ref 0.0–0.5)
HEMATOCRIT: 39.3 % (ref 34.8–46.6)
HGB: 13.4 g/dL (ref 11.6–15.9)
LYMPH%: 21.8 % (ref 14.0–49.7)
MCH: 31.8 pg (ref 25.1–34.0)
MCHC: 34.1 g/dL (ref 31.5–36.0)
MCV: 93.1 fL (ref 79.5–101.0)
MONO#: 0.4 10*3/uL (ref 0.1–0.9)
MONO%: 10.9 % (ref 0.0–14.0)
NEUT%: 65.2 % (ref 38.4–76.8)
NEUTROS ABS: 2.2 10*3/uL (ref 1.5–6.5)
Platelets: 251 10*3/uL (ref 145–400)
RBC: 4.22 10*6/uL (ref 3.70–5.45)
RDW: 13.1 % (ref 11.2–14.5)
WBC: 3.4 10*3/uL — AB (ref 3.9–10.3)
lymph#: 0.7 10*3/uL — ABNORMAL LOW (ref 0.9–3.3)
nRBC: 0 % (ref 0–0)

## 2015-06-15 LAB — COMPREHENSIVE METABOLIC PANEL (CC13)
ALT: 25 U/L (ref 0–55)
AST: 23 U/L (ref 5–34)
Albumin: 4 g/dL (ref 3.5–5.0)
Alkaline Phosphatase: 36 U/L — ABNORMAL LOW (ref 40–150)
Anion Gap: 8 mEq/L (ref 3–11)
BUN: 10.2 mg/dL (ref 7.0–26.0)
CALCIUM: 9.4 mg/dL (ref 8.4–10.4)
CHLORIDE: 105 meq/L (ref 98–109)
CO2: 24 meq/L (ref 22–29)
CREATININE: 0.7 mg/dL (ref 0.6–1.1)
EGFR: >90 mL/min/{1.73_m2} (ref >90)
Glucose: 91 mg/dl (ref 70–140)
Potassium: 4 mEq/L (ref 3.5–5.1)
Sodium: 136 mEq/L (ref 136–145)
Total Bilirubin: <0.3 mg/dL (ref 0.20–1.20)
Total Protein: 6.2 g/dL — ABNORMAL LOW (ref 6.4–8.3)

## 2015-06-15 MED ORDER — DIPHENHYDRAMINE HCL 50 MG/ML IJ SOLN
25.0000 mg | Freq: Once | INTRAMUSCULAR | Status: AC
  Administered 2015-06-15: 25 mg via INTRAVENOUS

## 2015-06-15 MED ORDER — FAMOTIDINE IN NACL 20-0.9 MG/50ML-% IV SOLN
20.0000 mg | Freq: Once | INTRAVENOUS | Status: AC
  Administered 2015-06-15: 20 mg via INTRAVENOUS

## 2015-06-15 MED ORDER — FAMOTIDINE IN NACL 20-0.9 MG/50ML-% IV SOLN
INTRAVENOUS | Status: AC
  Filled 2015-06-15: qty 50

## 2015-06-15 MED ORDER — SODIUM CHLORIDE 0.9 % IV SOLN
204.4000 mg | Freq: Once | INTRAVENOUS | Status: AC
  Administered 2015-06-15: 200 mg via INTRAVENOUS
  Filled 2015-06-15: qty 20

## 2015-06-15 MED ORDER — DIPHENHYDRAMINE HCL 50 MG/ML IJ SOLN
INTRAMUSCULAR | Status: AC
  Filled 2015-06-15: qty 1

## 2015-06-15 MED ORDER — SODIUM CHLORIDE 0.9 % IV SOLN
Freq: Once | INTRAVENOUS | Status: AC
  Administered 2015-06-15: 12:00:00 via INTRAVENOUS

## 2015-06-15 MED ORDER — DEXAMETHASONE SODIUM PHOSPHATE 100 MG/10ML IJ SOLN
Freq: Once | INTRAMUSCULAR | Status: AC
  Administered 2015-06-15: 12:00:00 via INTRAVENOUS
  Filled 2015-06-15: qty 8

## 2015-06-15 MED ORDER — PACLITAXEL CHEMO INJECTION 300 MG/50ML
45.0000 mg/m2 | Freq: Once | INTRAVENOUS | Status: AC
  Administered 2015-06-15: 78 mg via INTRAVENOUS
  Filled 2015-06-15: qty 13

## 2015-06-15 NOTE — Patient Instructions (Signed)
[facility] Discharge Instructions for Patients Receiving Chemotherapy  Today you received the following chemotherapy agents Taxol /Carboplatin.  To help prevent nausea and vomiting after your treatment, we encourage you to take your nausea medication as needed   If you develop nausea and vomiting that is not controlled by your nausea medication, call the clinic.   BELOW ARE SYMPTOMS THAT SHOULD BE REPORTED IMMEDIATELY:  *FEVER GREATER THAN 100.5 F  *CHILLS WITH OR WITHOUT FEVER  NAUSEA AND VOMITING THAT IS NOT CONTROLLED WITH YOUR NAUSEA MEDICATION  *UNUSUAL SHORTNESS OF BREATH  *UNUSUAL BRUISING OR BLEEDING  TENDERNESS IN MOUTH AND THROAT WITH OR WITHOUT PRESENCE OF ULCERS  *URINARY PROBLEMS  *BOWEL PROBLEMS  UNUSUAL RASH Items with * indicate a potential emergency and should be followed up as soon as possible.  Feel free to call the clinic you have any questions or concerns. The clinic phone number is [phone].  Please show the CHEMO ALERT CARD at check-in to the Emergency Department and triage nurse.   

## 2015-06-15 NOTE — Progress Notes (Signed)
Nutrition follow-up completed with patient receiving chemotherapy and radiation therapy for lung cancer. Patient reports she is doing well. Patient is consuming 3 meals with snacks. Patient reports she is preparing juices twice daily. She drinks at least 2 quarts of water plus other liquids. No nutrition impact symptoms.  Nutrition diagnosis:  Food and nutrition related knowledge deficit improved.  Intervention: Provided support and encouragement for patient to continue consuming healthy plant based diet. Educated patient on strategies for eating if she develops nausea and vomiting. Teach back method was used.  Questions were answered.  Monitoring, evaluation, goals: Patient will tolerate adequate calories and protein to promote weight maintenance.  Next visit: Monday, December 12, during infusion.  **Disclaimer: This note was dictated with voice recognition software. Similar sounding words can inadvertently be transcribed and this note may contain transcription errors which may not have been corrected upon publication of note.**  ]

## 2015-06-16 ENCOUNTER — Ambulatory Visit
Admission: RE | Admit: 2015-06-16 | Discharge: 2015-06-16 | Disposition: A | Discharge status: Home / Self Care | Payer: Managed Care, Other (non HMO) | Source: Ambulatory Visit | Attending: Radiation Oncology | Admitting: Radiation Oncology

## 2015-06-16 ENCOUNTER — Encounter: Payer: Self-pay | Admitting: Radiation Oncology

## 2015-06-16 VITALS — BP 134/90 | HR 74 | Temp 98.3°F | Ht 63.0 in | Wt 144.4 lb

## 2015-06-16 DIAGNOSIS — C3411 Malignant neoplasm of upper lobe, right bronchus or lung: Secondary | ICD-10-CM | POA: Diagnosis not present

## 2015-06-16 DIAGNOSIS — Z510 Encounter for antineoplastic radiation therapy: Secondary | ICD-10-CM | POA: Diagnosis not present

## 2015-06-16 MED ORDER — RADIAPLEXRX EX GEL
Freq: Once | CUTANEOUS | Status: AC
  Administered 2015-06-16: 10:00:00 via TOPICAL

## 2015-06-16 NOTE — Addendum Note (Signed)
Encounter addended by: Heywood Footman, RN on: 06/16/2015 10:02 AM<BR>     Documentation filed: Inpatient MAR

## 2015-06-16 NOTE — Addendum Note (Signed)
Encounter addended by: Heywood Footman, RN on: 06/16/2015 10:07 AM<BR>     Documentation filed: Notes Section, [employer], Inpatient MAR, Orders

## 2015-06-16 NOTE — Progress Notes (Signed)
Rose Figueroa is here for her 13th fraction of radiation to her Right Lung. She reports some shortness of breath while doing her 2.5 mile walk (which she does 2-3 times a week). Her oxygen saturation was 98% today on room air. She also reports going to the gym and riding her bike occasionally. Her skin is ok over her radiation site, and she is using the radiaplex cream at night. Her skin gets reddish in color in the days after her chemotherapy which she receives on Monday's. She reports she is eating well. She did receive carafate at her last appointment but reports her heartburn had improved and she has not used it, but wonders if she could use it on an as needed basis?  BP 134/90 mmHg  Pulse 74  Temp(Src) 98.3 F (36.8 C)  Ht '5\' 3"'$  (1.6 m)  Wt 144 lb 6.4 oz (65.499 kg)  BMI 25.59 kg/m2  SpO2 98%   Wt Readings from Last 3 Encounters:  06/16/15 144 lb 6.4 oz (65.499 kg)  06/08/15 141 lb 8 oz (64.184 kg)  06/04/15 143 lb (64.864 kg)

## 2015-06-16 NOTE — Progress Notes (Signed)
Department of Radiation Oncology  Phone:  [phone] Fax:        [phone]  Weekly Treatment Note    Name: Rose Figueroa Date: 06/16/2015 MRN: [id number] DOB: October 18, 1955   Current dose: 26 Gy  Current fraction: 13   MEDICATIONS: Current Outpatient Prescriptions  Medication Sig Dispense Refill  . Biotin 5000 MCG CAPS Take 5,000 mcg by mouth daily.    . Calcium Carb-Cholecalciferol (CALCIUM 1000 + D PO) Take 6 tablets by mouth daily. D3 = 200 IU per each.    . L-LYSINE PO Take 4,000 mg by mouth daily.     . Melatonin 3 MG CAPS Take 3 mg by mouth once.    . Misc Natural Products (TART CHERRY ADVANCED PO) Take 1,200 mg by mouth daily.    . Multiple Vitamins-Minerals (CENTRUM SILVER PO) Take 1 tablet by mouth daily.    . Omega-3 Fatty Acids (RA FISH OIL) 1400 MG CPDR Take 2 capsules by mouth daily.     . prochlorperazine (COMPAZINE) 10 MG tablet Take 1 tablet (10 mg total) by mouth every 6 (six) hours as needed for nausea or vomiting. 30 tablet 0  . Spirulina 500 MG TABS Take by mouth daily.    . Turmeric Curcumin 500 MG CAPS Take 2 each by mouth daily.     . vitamin B-12 (CYANOCOBALAMIN) 1000 MCG tablet Take 1,000 mcg by mouth daily.    . Wound Cleansers (RADIAPLEX EX) Apply topically.    . sucralfate (CARAFATE) 1 GM/10ML suspension Take 10 mLs (1 g total) by mouth 4 (four) times daily -  with meals and at bedtime. (Patient not taking: Reported on 06/16/2015) 420 mL 0   No current facility-administered medications for this encounter.     ALLERGIES: Review of patient's allergies indicates no known allergies.   LABORATORY DATA:  Lab Results  Component Value Date   WBC 3.4* 06/15/2015   HGB 13.4 06/15/2015   HCT 39.3 06/15/2015   MCV 93.1 06/15/2015   PLT 251 06/15/2015   Lab Results  Component Value Date   NA 136 06/15/2015   K 4.0 06/15/2015   CL 103 04/30/2015   CO2 24 06/15/2015   Lab Results  Component Value Date   ALT 25 06/15/2015   AST 23  06/15/2015   ALKPHOS 36* 06/15/2015   BILITOT <0.30 06/15/2015     NARRATIVE: Rose Figueroa was seen today for weekly treatment management. The chart was checked and the patient's films were reviewed.  Rose Figueroa is here for her 13th fraction of radiation to her Right Lung. She reports some shortness of breath while doing her 2.5 mile walk (which she does 2-3 times a week). Her oxygen saturation was 98% today on room air. She also reports going to the gym and riding her bike occasionally. Her skin is ok over her radiation site, and she is using the radiaplex cream at night. Her skin gets reddish in color in the days after her chemotherapy which she receives on Monday's. She reports she is eating well. She did receive carafate at her last appointment but reports her heartburn had improved and she has not used it, but wonders if she could use it on an as needed basis?  BP 134/90 mmHg  Pulse 74  Temp(Src) 98.3 F (36.8 C)  Ht '5\' 3"'$  (1.6 m)  Wt 144 lb 6.4 oz (65.499 kg)  BMI 25.59 kg/m2  SpO2 98%   Wt Readings from Last 3 Encounters:  06/16/15 144 lb  6.4 oz (65.499 kg)  06/08/15 141 lb 8 oz (64.184 kg)  06/04/15 143 lb (64.864 kg)    PHYSICAL EXAMINATION: height is '5\' 3"'$  (1.6 m) and weight is 144 lb 6.4 oz (65.499 kg). Her temperature is 98.3 F (36.8 C). Her blood pressure is 134/90 and her pulse is 74. Her oxygen saturation is 98%.        ASSESSMENT: The patient is doing satisfactorily with treatment.The patient appears to be doing excellent. She does have Carafate available but has not needed this over the last few days as her swallowing she feels has actually improved some. She does know to begin using this if she begins experiencing worsening of this symptom.  PLAN: We will continue with the patient's radiation treatment as planned.

## 2015-06-17 ENCOUNTER — Ambulatory Visit
Admission: RE | Admit: 2015-06-17 | Discharge: 2015-06-17 | Disposition: A | Discharge status: Home / Self Care | Payer: Managed Care, Other (non HMO) | Source: Ambulatory Visit | Attending: Radiation Oncology | Admitting: Radiation Oncology

## 2015-06-17 DIAGNOSIS — Z510 Encounter for antineoplastic radiation therapy: Secondary | ICD-10-CM | POA: Diagnosis not present

## 2015-06-19 ENCOUNTER — Other Ambulatory Visit: Payer: Self-pay | Admitting: Medical Oncology

## 2015-06-19 DIAGNOSIS — C3411 Malignant neoplasm of upper lobe, right bronchus or lung: Secondary | ICD-10-CM

## 2015-06-22 ENCOUNTER — Encounter: Payer: Self-pay | Admitting: Internal Medicine

## 2015-06-22 ENCOUNTER — Ambulatory Visit
Admission: RE | Admit: 2015-06-22 | Discharge: 2015-06-22 | Disposition: A | Discharge status: Home / Self Care | Payer: Managed Care, Other (non HMO) | Source: Ambulatory Visit | Attending: Radiation Oncology | Admitting: Radiation Oncology

## 2015-06-22 ENCOUNTER — Telehealth: Payer: Self-pay | Admitting: Radiation Oncology

## 2015-06-22 ENCOUNTER — Telehealth: Payer: Self-pay | Admitting: *Deleted

## 2015-06-22 ENCOUNTER — Ambulatory Visit (HOSPITAL_BASED_OUTPATIENT_CLINIC_OR_DEPARTMENT_OTHER): Payer: Managed Care, Other (non HMO) | Admitting: Internal Medicine

## 2015-06-22 ENCOUNTER — Other Ambulatory Visit (HOSPITAL_BASED_OUTPATIENT_CLINIC_OR_DEPARTMENT_OTHER): Payer: Managed Care, Other (non HMO)

## 2015-06-22 ENCOUNTER — Ambulatory Visit (HOSPITAL_BASED_OUTPATIENT_CLINIC_OR_DEPARTMENT_OTHER): Payer: Managed Care, Other (non HMO)

## 2015-06-22 VITALS — BP 151/92 | HR 67 | Temp 98.4°F | Resp 20 | Ht 63.0 in | Wt 143.2 lb

## 2015-06-22 DIAGNOSIS — K208 Other esophagitis: Secondary | ICD-10-CM

## 2015-06-22 DIAGNOSIS — C3411 Malignant neoplasm of upper lobe, right bronchus or lung: Secondary | ICD-10-CM

## 2015-06-22 DIAGNOSIS — Z5111 Encounter for antineoplastic chemotherapy: Secondary | ICD-10-CM

## 2015-06-22 DIAGNOSIS — R918 Other nonspecific abnormal finding of lung field: Secondary | ICD-10-CM

## 2015-06-22 DIAGNOSIS — C3491 Malignant neoplasm of unspecified part of right bronchus or lung: Secondary | ICD-10-CM

## 2015-06-22 DIAGNOSIS — Z510 Encounter for antineoplastic radiation therapy: Secondary | ICD-10-CM | POA: Diagnosis not present

## 2015-06-22 LAB — CBC WITH DIFFERENTIAL/PLATELET
BASO%: 0.8 % (ref 0.0–2.0)
Basophils Absolute: 0 10*3/uL (ref 0.0–0.1)
EOS ABS: 0.1 10*3/uL (ref 0.0–0.5)
EOS%: 2.3 % (ref 0.0–7.0)
HEMATOCRIT: 40.2 % (ref 34.8–46.6)
HGB: 13.2 g/dL (ref 11.6–15.9)
LYMPH%: 21 % (ref 14.0–49.7)
MCH: 31.5 pg (ref 25.1–34.0)
MCHC: 32.9 g/dL (ref 31.5–36.0)
MCV: 96 fL (ref 79.5–101.0)
MONO#: 0.4 10*3/uL (ref 0.1–0.9)
MONO%: 11.3 % (ref 0.0–14.0)
NEUT%: 64.6 % (ref 38.4–76.8)
NEUTROS ABS: 2 10*3/uL (ref 1.5–6.5)
PLATELETS: 222 10*3/uL (ref 145–400)
RBC: 4.19 10*6/uL (ref 3.70–5.45)
RDW: 13.8 % (ref 11.2–14.5)
WBC: 3.1 10*3/uL — AB (ref 3.9–10.3)
lymph#: 0.7 10*3/uL — ABNORMAL LOW (ref 0.9–3.3)

## 2015-06-22 LAB — COMPREHENSIVE METABOLIC PANEL (CC13)
ALBUMIN: 3.9 g/dL (ref 3.5–5.0)
ALK PHOS: 40 U/L (ref 40–150)
ALT: 28 U/L (ref 0–55)
ANION GAP: 9 meq/L (ref 3–11)
AST: 26 U/L (ref 5–34)
BILIRUBIN TOTAL: 0.42 mg/dL (ref 0.20–1.20)
BUN: 12.8 mg/dL (ref 7.0–26.0)
CALCIUM: 9.6 mg/dL (ref 8.4–10.4)
CO2: 24 meq/L (ref 22–29)
Chloride: 106 mEq/L (ref 98–109)
Creatinine: 0.7 mg/dL (ref 0.6–1.1)
Glucose: 79 mg/dl (ref 70–140)
Potassium: 4.5 mEq/L (ref 3.5–5.1)
Sodium: 139 mEq/L (ref 136–145)
TOTAL PROTEIN: 6.6 g/dL (ref 6.4–8.3)

## 2015-06-22 MED ORDER — LIDOCAINE-PRILOCAINE 2.5-2.5 % EX CREA: 1.0000 "application " | TOPICAL_CREAM | CUTANEOUS | Status: DC | PRN

## 2015-06-22 MED ORDER — CARBOPLATIN CHEMO INJECTION 450 MG/45ML
204.4000 mg | Freq: Once | INTRAVENOUS | Status: AC
  Administered 2015-06-22: 200 mg via INTRAVENOUS
  Filled 2015-06-22: qty 20

## 2015-06-22 MED ORDER — COLD PACK MISC ONCOLOGY
1.0000 | Freq: Once | Status: DC | PRN
  Filled 2015-06-22: qty 1

## 2015-06-22 MED ORDER — DIPHENHYDRAMINE HCL 50 MG/ML IJ SOLN
INTRAMUSCULAR | Status: AC
  Filled 2015-06-22: qty 1

## 2015-06-22 MED ORDER — FAMOTIDINE IN NACL 20-0.9 MG/50ML-% IV SOLN
20.0000 mg | Freq: Once | INTRAVENOUS | Status: AC
  Administered 2015-06-22: 20 mg via INTRAVENOUS

## 2015-06-22 MED ORDER — SODIUM CHLORIDE 0.9 % IV SOLN
Freq: Once | INTRAVENOUS | Status: AC
  Administered 2015-06-22: 14:00:00 via INTRAVENOUS
  Filled 2015-06-22: qty 8

## 2015-06-22 MED ORDER — SODIUM CHLORIDE 0.9 % IV SOLN
Freq: Once | INTRAVENOUS | Status: AC
  Administered 2015-06-22: 13:00:00 via INTRAVENOUS

## 2015-06-22 MED ORDER — FAMOTIDINE IN NACL 20-0.9 MG/50ML-% IV SOLN
INTRAVENOUS | Status: AC
  Filled 2015-06-22: qty 50

## 2015-06-22 MED ORDER — PACLITAXEL CHEMO INJECTION 300 MG/50ML
45.0000 mg/m2 | Freq: Once | INTRAVENOUS | Status: AC
  Administered 2015-06-22: 78 mg via INTRAVENOUS
  Filled 2015-06-22: qty 13

## 2015-06-22 NOTE — Telephone Encounter (Signed)
Understand from Norton Blizzard, RN the patient has questions about a rash and cream. Attempted to reach the patient on her cell and at home. No answer at either. Left message on both. Will put a note on the treatment schedule to send patient around to nursing tomorrow following treatment if I can't reach her today.

## 2015-06-22 NOTE — Telephone Encounter (Signed)
Call from [facility], left message on voice mail, gave info to Joaquim Lai, RN, this is a Dr. Cheron Schaumann patient 1:44 PM

## 2015-06-22 NOTE — Telephone Encounter (Signed)
Patient returned this RN's call. Patient describes what sound to be radiation dermatitis within treatment field. Patient plans to present to nursing following radiation treatment to received Sonafine cream to use instead of radiaplex. Also, patient understands hydrocortisone cream can be used on affect area just NOT four hours prior to treatment. Patient understands if power port is place on the opposite side of the treatment field EMLA cream is safe to have on port when presenting for radiation treatment.

## 2015-06-22 NOTE — Progress Notes (Signed)
[place] [phone]   [phone]  OFFICE PROGRESS NOTE  [name], [name], MD [address] [place] [address]  DIAGNOSIS: Stage IIIB (T2a, N3, M0) non-small cell lung cancer, adenocarcinoma with negative EGFR mutation and negative ALK gene translocation diagnosed in October 2016.  PRIOR THERAPY: None  CURRENT THERAPY: Concurrent chemoradiation with weekly carboplatin for AUC of 2 and paclitaxel 45 MG/M2 status post 3 cycles.  INTERVAL HISTORY: Rose Figueroa 59 y.o. female returns to the clinic today for follow-up visit accompanied by her husband. The patient is tolerating her current course of concurrent chemoradiation fairly well except for mild radiation induced esophagitis. She has Carafate at home but she has not used it yet. She denied having any significant weight loss or night sweats. She denied having any chest pain, shortness of breath, cough or hemoptysis. She has no significant nausea or vomiting, no fever or chills. She is interested in having a Port-A-Cath placed because of poor IV access.  MEDICAL HISTORY: Past Medical History  Diagnosis Date  . Osteoporosis   . H/O cold sores   . Diverticulosis   . Rectal bleeding   . BCC (basal cell carcinoma of skin)   . right upper lobe lung mass 04/15/2015  . Back pain   . Colon polyps   . Complication of anesthesia     pt. states that she is difficult to arouse  . PONV (postoperative nausea and vomiting)   . Dysrhythmia   . Anxiety   . Complication of anesthesia   . History of anemia     ALLERGIES:  has No Known Allergies.  MEDICATIONS:  Current Outpatient Prescriptions  Medication Sig Dispense Refill  . Biotin 5000 MCG CAPS Take 5,000 mcg by mouth daily.    . Calcium Carb-Cholecalciferol (CALCIUM 1000 + D PO) Take 6 tablets by mouth daily. D3 = 200 IU per each.    . L-LYSINE PO Take 4,000 mg by mouth daily.     . Melatonin 3 MG CAPS Take 3 mg by mouth once.    .  Misc Natural Products (TART CHERRY ADVANCED PO) Take 1,200 mg by mouth daily.    . Multiple Vitamins-Minerals (CENTRUM SILVER PO) Take 1 tablet by mouth daily.    . Omega-3 Fatty Acids (RA FISH OIL) 1400 MG CPDR Take 2 capsules by mouth daily.     . prochlorperazine (COMPAZINE) 10 MG tablet Take 1 tablet (10 mg total) by mouth every 6 (six) hours as needed for nausea or vomiting. 30 tablet 0  . Spirulina 500 MG TABS Take by mouth daily.    . sucralfate (CARAFATE) 1 GM/10ML suspension Take 10 mLs (1 g total) by mouth 4 (four) times daily -  with meals and at bedtime. 420 mL 0  . Turmeric Curcumin 500 MG CAPS Take 2 each by mouth daily.     . vitamin B-12 (CYANOCOBALAMIN) 1000 MCG tablet Take 1,000 mcg by mouth daily.    . Wound Cleansers (RADIAPLEX EX) Apply topically.     No current facility-administered medications for this visit.    SURGICAL HISTORY:  Past Surgical History  Procedure Laterality Date  . Cystectomy      right breast x 2  . Colonoscopy    . Colonoscopy w/ polypectomy    . Video bronchoscopy with endobronchial ultrasound N/A 05/07/2015    Procedure: VIDEO BRONCHOSCOPY WITH ENDOBRONCHIAL ULTRASOUND;  Surgeon: Ivin Poot, MD;  Location: [facility];  Service: Thoracic;  Laterality: N/A;  . Mediastinoscopy N/A 05/07/2015    Procedure: MEDIASTINOSCOPY;  Surgeon: Ivin Poot, MD;  Location: [facility];  Service: Thoracic;  Laterality: N/A;    REVIEW OF SYSTEMS:  A comprehensive review of systems was negative except for: Gastrointestinal: positive for odynophagia   PHYSICAL EXAMINATION: General appearance: alert, cooperative and no distress Head: Normocephalic, without obvious abnormality, atraumatic Neck: no adenopathy, no JVD, supple, symmetrical, trachea midline and thyroid not enlarged, symmetric, no tenderness/mass/nodules Lymph nodes: Cervical, supraclavicular, and axillary nodes normal. Resp: clear to auscultation bilaterally Back: symmetric, no curvature. ROM normal.  No CVA tenderness. Cardio: regular rate and rhythm, S1, S2 normal, no murmur, click, rub or gallop GI: soft, non-tender; bowel sounds normal; no masses,  no organomegaly Extremities: extremities normal, atraumatic, no cyanosis or edema  ECOG PERFORMANCE STATUS: 1 - Symptomatic but completely ambulatory  Blood pressure 151/92, pulse 67, temperature 98.4 F (36.9 C), temperature source Oral, resp. rate 20, height 5' 3"  (1.6 m), weight 143 lb 3.2 oz (64.955 kg), SpO2 100 %.  LABORATORY DATA: Lab Results  Component Value Date   WBC 3.1* 06/22/2015   HGB 13.2 06/22/2015   HCT 40.2 06/22/2015   MCV 96.0 06/22/2015   PLT 222 06/22/2015      Chemistry      Component Value Date/Time   NA 139 06/22/2015 1121   NA 138 04/30/2015 1336   K 4.5 06/22/2015 1121   K 4.0 04/30/2015 1336   CL 103 04/30/2015 1336   CO2 24 06/22/2015 1121   CO2 28 04/30/2015 1336   BUN 12.8 06/22/2015 1121   BUN 10 04/30/2015 1336   CREATININE 0.7 06/22/2015 1121   CREATININE 0.76 04/30/2015 1336      Component Value Date/Time   CALCIUM 9.6 06/22/2015 1121   CALCIUM 9.8 04/30/2015 1336   ALKPHOS 40 06/22/2015 1121   ALKPHOS 35* 04/30/2015 1336   AST 26 06/22/2015 1121   AST 27 04/30/2015 1336   ALT 28 06/22/2015 1121   ALT 24 04/30/2015 1336   BILITOT 0.42 06/22/2015 1121   BILITOT 0.6 04/30/2015 1336       RADIOGRAPHIC STUDIES: No results found.  ASSESSMENT AND PLAN: This is a very pleasant 59 years old white female with stage IIIB non-small cell lung cancer currently undergoing a course of concurrent chemoradiation with weekly carboplatin and paclitaxel is status post 3 cycles and tolerating her treatment fairly well. I recommended for the patient to proceed with cycle #4 today as a scheduled. For the radiation induced esophagitis, I advised the patient to start taking Carafate as previously prescribed. For IV access, I will arrange for the patient to have a Port-A-Cath placed by interventional  radiology. The patient would come back for follow-up visit in 2 weeks for reevaluation and management of any adverse effect of her treatment. She was advised to call immediately if she has any concerning symptoms in the interval. The patient voices understanding of current disease status and treatment options and is in agreement with the current care plan.  All questions were answered. The patient knows to call the clinic with any problems, questions or concerns. We can certainly see the patient much sooner if necessary.  Disclaimer: This note was dictated with voice recognition software. Similar sounding words can inadvertently be transcribed and may not be corrected upon review.

## 2015-06-23 ENCOUNTER — Ambulatory Visit
Admission: RE | Admit: 2015-06-23 | Discharge: 2015-06-23 | Disposition: A | Discharge status: Home / Self Care | Payer: Managed Care, Other (non HMO) | Source: Ambulatory Visit | Attending: Radiation Oncology | Admitting: Radiation Oncology

## 2015-06-23 ENCOUNTER — Ambulatory Visit (HOSPITAL_BASED_OUTPATIENT_CLINIC_OR_DEPARTMENT_OTHER): Payer: Managed Care, Other (non HMO) | Admitting: Nurse Practitioner

## 2015-06-23 ENCOUNTER — Telehealth: Payer: Self-pay | Admitting: *Deleted

## 2015-06-23 VITALS — BP 128/82 | HR 86 | Temp 98.6°F | Resp 20 | Wt 143.3 lb

## 2015-06-23 DIAGNOSIS — R21 Rash and other nonspecific skin eruption: Secondary | ICD-10-CM

## 2015-06-23 DIAGNOSIS — C3411 Malignant neoplasm of upper lobe, right bronchus or lung: Secondary | ICD-10-CM

## 2015-06-23 DIAGNOSIS — K1370 Unspecified lesions of oral mucosa: Secondary | ICD-10-CM

## 2015-06-23 DIAGNOSIS — Z510 Encounter for antineoplastic radiation therapy: Secondary | ICD-10-CM | POA: Diagnosis not present

## 2015-06-23 MED ORDER — METHYLPREDNISOLONE 4 MG PO TBPK: ORAL_TABLET | ORAL | Status: DC

## 2015-06-23 MED ORDER — SONAFINE EX EMUL
1.0000 "application " | Freq: Two times a day (BID) | CUTANEOUS | Status: DC
  Administered 2015-06-23: 1 via TOPICAL
  Filled 2015-06-23: qty 45

## 2015-06-23 NOTE — Telephone Encounter (Signed)
Pt filled out a walk in form with complaints of hives resulting from her fourth cycle chemo treatment 11/28 POF sent for pt to be added to [facility] schedule

## 2015-06-23 NOTE — Progress Notes (Signed)
Patient presented to nursing following daily radiation treatment. Provided patient with Sonafine cream and directed upon use. Red rash noted over patient's entire body. Aware the patient received her fourth cycle of chemotherapy yesterday. Phoned nurse for [employer] at [phone]. No answer. Left message. Completed symptom management form and sent patient to [facility] to check in for symptom management evaluation.

## 2015-06-24 ENCOUNTER — Encounter: Payer: Self-pay | Admitting: Nurse Practitioner

## 2015-06-24 ENCOUNTER — Ambulatory Visit
Admission: RE | Admit: 2015-06-24 | Discharge: 2015-06-24 | Disposition: A | Discharge status: Home / Self Care | Payer: Managed Care, Other (non HMO) | Source: Ambulatory Visit | Attending: Radiation Oncology | Admitting: Radiation Oncology

## 2015-06-24 ENCOUNTER — Telehealth: Payer: Self-pay | Admitting: Internal Medicine

## 2015-06-24 DIAGNOSIS — R21 Rash and other nonspecific skin eruption: Secondary | ICD-10-CM

## 2015-06-24 DIAGNOSIS — K1370 Unspecified lesions of oral mucosa: Secondary | ICD-10-CM

## 2015-06-24 DIAGNOSIS — Z510 Encounter for antineoplastic radiation therapy: Secondary | ICD-10-CM | POA: Diagnosis not present

## 2015-06-24 HISTORY — DX: Unspecified lesions of oral mucosa: K13.70

## 2015-06-24 HISTORY — DX: Rash and other nonspecific skin eruption: R21

## 2015-06-24 NOTE — Assessment & Plan Note (Signed)
Patient received cycle 4 of her carboplatin/paclitaxel chemotherapy on 06/22/2015.  She continues to undergo radiation treatments on a daily basis.  Patient presented to the [facility] today with complaint of a 24-48 hour history of progressive rash.  She states that she has some mild to moderate erythema and some blister-like lesions to her chest from her radiation treatments; but this rash is different and is very pruritic.  Patient denies any other new symptoms whatsoever.  She denies any recent fevers or chills.  Patient is scheduled to return on 06/29/2015 for labs, visit, and chemotherapy.

## 2015-06-24 NOTE — Telephone Encounter (Signed)
lvm forpt regarding to DEC appt

## 2015-06-24 NOTE — Assessment & Plan Note (Signed)
Patient has a tiny lesion to the left.  Oral mucosa; that she states has been there for quite some time.  She states that occasionally the lesion will grow and she is able to rupture the lesion to make it feel better.  She states she has seen her dentist for this issue; and he is unclear what this lesion is.  I asked the patient is her dentist has considered a biopsy of this lesion; but patient states that the dentist has not mentioned that to her as of yet.  Advised patient to follow back up with her dentist regarding a questionable biopsy of the site.  There were no other oral lesions noted on exam.

## 2015-06-24 NOTE — Assessment & Plan Note (Signed)
Patient received cycle 4 of her carboplatin/paclitaxel chemotherapy on 06/22/2015.  She continues to undergo radiation treatments on a daily basis.  Patient presented to the [facility] today with complaint of a 24-48 hour history of progressive rash.  She states that she has some mild to moderate erythema and some blister-like lesions to her chest from her radiation treatments; but this rash is different and is very pruritic.  On exam.-Patient with erythema and some healed lesions to her anterior chest.  Patient has increased erythema to her entire face and neck; and also has multiple tiny pink/red rash to her chest and abdominal wall.  She also has the same rash to her extremities as well.  Patient is scratching at the rash.  Most likely, this rash is secondary to chemotherapy.  Patient will be prescribed a Medrol Dosepak; and also advised to take Benadryl 25 mg every 6 hours and Pepcid 20 mg every 12 hours to see if this helps.  Patient was advised to call/return if symptoms persist or worsen.

## 2015-06-24 NOTE — Progress Notes (Signed)
SYMPTOM MANAGEMENT CLINIC   HPI: Rose Figueroa 59 y.o. female diagnosed with lung cancer.  Currently undergoing carboplatin/paclitaxel chemotherapy regimen and radiation treatments.  Patient received cycle 4 of her carboplatin/paclitaxel chemotherapy on 06/22/2015.  She continues to undergo radiation treatments on a daily basis.  Patient presented to the [facility] today with complaint of a 24-48 hour history of progressive rash.  She states that she has some mild to moderate erythema and some blister-like lesions to her chest from her radiation treatments; but this rash is different and is very pruritic.  On exam.-Patient with erythema and some healed lesions to her anterior chest.  Patient has increased erythema to her entire face and neck; and also has multiple tiny pink/red rash to her chest and abdominal wall.  She also has the same rash to her extremities as well.  Patient is scratching at the rash.  Most likely, this rash is secondary to chemotherapy.  Patient will be prescribed a Medrol Dosepak; and also advised to take Benadryl 25 mg every 6 hours and Pepcid 20 mg every 12 hours to see if this helps.  Patient was advised to call/return if symptoms persist or worsen.   HPI  ROS  Past Medical History  Diagnosis Date  . Osteoporosis   . H/O cold sores   . Diverticulosis   . Rectal bleeding   . BCC (basal cell carcinoma of skin)   . right upper lobe lung mass 04/15/2015  . Back pain   . Colon polyps   . Complication of anesthesia     pt. states that she is difficult to arouse  . PONV (postoperative nausea and vomiting)   . Dysrhythmia   . Anxiety   . Complication of anesthesia   . History of anemia     Past Surgical History  Procedure Laterality Date  . Cystectomy      right breast x 2  . Colonoscopy    . Colonoscopy w/ polypectomy    . Video bronchoscopy with endobronchial ultrasound N/A 05/07/2015    Procedure: VIDEO BRONCHOSCOPY WITH ENDOBRONCHIAL  ULTRASOUND;  Surgeon: Ivin Poot, MD;  Location: [facility];  Service: Thoracic;  Laterality: N/A;  . Mediastinoscopy N/A 05/07/2015    Procedure: MEDIASTINOSCOPY;  Surgeon: Ivin Poot, MD;  Location: [facility];  Service: Thoracic;  Laterality: N/A;    has Cancer of upper lobe of right lung ([place]); Encounter for antineoplastic chemotherapy; Generalized rash; and Oral lesion on her problem list.    has No Known Allergies.    Medication List       This list is accurate as of: 06/23/15 11:59 PM.  Always use your most recent med list.               Biotin 5000 MCG Caps  Take 5,000 mcg by mouth daily.     CALCIUM 1000 + D PO  Take 6 tablets by mouth daily. D3 = 200 IU per each.     CENTRUM SILVER PO  Take 1 tablet by mouth daily.     L-LYSINE PO  Take 4,000 mg by mouth daily.     lidocaine-prilocaine cream  Commonly known as:  EMLA  Apply 1 application topically as needed.     Melatonin 3 MG Caps  Take 3 mg by mouth once.     methylPREDNISolone 4 MG Tbpk tablet  Commonly known as:  MEDROL DOSEPAK  Medrol dose pak: take as directed.     prochlorperazine 10 MG tablet  Commonly known as:  COMPAZINE  Take 1 tablet (10 mg total) by mouth every 6 (six) hours as needed for nausea or vomiting.     RA FISH OIL 1400 MG Cpdr  Take 2 capsules by mouth daily.     SONAFINE  Apply 1 application topically 3 (three) times daily.     Spirulina 500 MG Tabs  Take by mouth daily.     sucralfate 1 GM/10ML suspension  Commonly known as:  CARAFATE  Take 10 mLs (1 g total) by mouth 4 (four) times daily -  with meals and at bedtime.     TART CHERRY ADVANCED PO  Take 1,200 mg by mouth daily.     Turmeric Curcumin 500 MG Caps  Take 2 each by mouth daily.     vitamin B-12 1000 MCG tablet  Commonly known as:  CYANOCOBALAMIN  Take 1,000 mcg by mouth daily.         PHYSICAL EXAMINATION  Oncology Vitals 06/23/2015 06/22/2015  Height - 160 cm  Weight 65 kg 64.955 kg  Weight  (lbs) 143 lbs 5 oz 143 lbs 3 oz  BMI (kg/m2) - 25.37 kg/m2  Temp 98.6 98.4  Pulse 86 67  Resp 20 20  SpO2 100 100  BSA (m2) - 1.7 m2   BP Readings from Last 2 Encounters:  06/23/15 128/82  06/22/15 151/92    Physical Exam  Constitutional: She is oriented to person, place, and time and well-developed, well-nourished, and in no distress.  HENT:  Head: Normocephalic and atraumatic.  Mouth/Throat: Oropharynx is clear and moist.  Patient has one tiny lesion to her left oral mucosa that does not appear inflamed or infected.  It is nontender with exam.  Eyes: Conjunctivae and EOM are normal. Pupils are equal, round, and reactive to light. Right eye exhibits no discharge. Left eye exhibits no discharge. No scleral icterus.  Neck: Normal range of motion.  Pulmonary/Chest: Effort normal. No respiratory distress.  Musculoskeletal: Normal range of motion. She exhibits no edema or tenderness.  Neurological: She is alert and oriented to person, place, and time. Gait normal.  Skin: Skin is warm and dry. Rash noted. There is erythema. No pallor.  On exam.-Patient with erythema and some healed lesions to her anterior chest which are most likely secondary to her radiation treatments.   Patient has increased erythema to her entire face and neck; and also has multiple tiny pink/red rash to her chest and abdominal wall.  She also has the same rash to her extremities as well.  Patient is scratching at the rash.    Psychiatric: Affect normal.  Nursing note and vitals reviewed.   Rash:     LABORATORY DATA:. Appointment on 06/22/2015  Component Date Value Ref Range Status  . WBC 06/22/2015 3.1* 3.9 - 10.3 10e3/uL Final  . NEUT# 06/22/2015 2.0  1.5 - 6.5 10e3/uL Final  . HGB 06/22/2015 13.2  11.6 - 15.9 g/dL Final  . HCT 06/22/2015 40.2  34.8 - 46.6 % Final  . Platelets 06/22/2015 222  145 - 400 10e3/uL Final  . MCV 06/22/2015 96.0  79.5 - 101.0 fL Final  . MCH 06/22/2015 31.5  25.1 - 34.0 pg  Final  . MCHC 06/22/2015 32.9  31.5 - 36.0 g/dL Final  . RBC 06/22/2015 4.19  3.70 - 5.45 10e6/uL Final  . RDW 06/22/2015 13.8  11.2 - 14.5 % Final  . lymph# 06/22/2015 0.7* 0.9 - 3.3 10e3/uL Final  . MONO# 06/22/2015 0.4  0.1 -  0.9 10e3/uL Final  . Eosinophils Absolute 06/22/2015 0.1  0.0 - 0.5 10e3/uL Final  . Basophils Absolute 06/22/2015 0.0  0.0 - 0.1 10e3/uL Final  . NEUT% 06/22/2015 64.6  38.4 - 76.8 % Final  . LYMPH% 06/22/2015 21.0  14.0 - 49.7 % Final  . MONO% 06/22/2015 11.3  0.0 - 14.0 % Final  . EOS% 06/22/2015 2.3  0.0 - 7.0 % Final  . BASO% 06/22/2015 0.8  0.0 - 2.0 % Final  . Sodium 06/22/2015 139  136 - 145 mEq/L Final  . Potassium 06/22/2015 4.5  3.5 - 5.1 mEq/L Final  . Chloride 06/22/2015 106  98 - 109 mEq/L Final  . CO2 06/22/2015 24  22 - 29 mEq/L Final  . Glucose 06/22/2015 79  70 - 140 mg/dl Final   Glucose reference range is for nonfasting patients. Fasting glucose reference range is 70- 100.  Marland Kitchen BUN 06/22/2015 12.8  7.0 - 26.0 mg/dL Final  . Creatinine 06/22/2015 0.7  0.6 - 1.1 mg/dL Final  . Total Bilirubin 06/22/2015 0.42  0.20 - 1.20 mg/dL Final  . Alkaline Phosphatase 06/22/2015 40  40 - 150 U/L Final  . AST 06/22/2015 26  5 - 34 U/L Final  . ALT 06/22/2015 28  0 - 55 U/L Final  . Total Protein 06/22/2015 6.6  6.4 - 8.3 g/dL Final  . Albumin 06/22/2015 3.9  3.5 - 5.0 g/dL Final  . Calcium 06/22/2015 9.6  8.4 - 10.4 mg/dL Final  . Anion Gap 06/22/2015 9  3 - 11 mEq/L Final  . EGFR 06/22/2015 >90  >90 ml/min/1.73 m2 Final   eGFR is calculated using the CKD-EPI Creatinine Equation (2009)     RADIOGRAPHIC STUDIES: No results found.  ASSESSMENT/PLAN:    Cancer of upper lobe of right lung [facility]) Patient received cycle 4 of her carboplatin/paclitaxel chemotherapy on 06/22/2015.  She continues to undergo radiation treatments on a daily basis.  Patient presented to the [facility] today with complaint of a 24-48 hour history of progressive rash.  She  states that she has some mild to moderate erythema and some blister-like lesions to her chest from her radiation treatments; but this rash is different and is very pruritic.  Patient denies any other new symptoms whatsoever.  She denies any recent fevers or chills.  Patient is scheduled to return on 06/29/2015 for labs, visit, and chemotherapy.    Generalized rash Patient received cycle 4 of her carboplatin/paclitaxel chemotherapy on 06/22/2015.  She continues to undergo radiation treatments on a daily basis.  Patient presented to the [facility] today with complaint of a 24-48 hour history of progressive rash.  She states that she has some mild to moderate erythema and some blister-like lesions to her chest from her radiation treatments; but this rash is different and is very pruritic.  On exam.-Patient with erythema and some healed lesions to her anterior chest.  Patient has increased erythema to her entire face and neck; and also has multiple tiny pink/red rash to her chest and abdominal wall.  She also has the same rash to her extremities as well.  Patient is scratching at the rash.  Most likely, this rash is secondary to chemotherapy.  Patient will be prescribed a Medrol Dosepak; and also advised to take Benadryl 25 mg every 6 hours and Pepcid 20 mg every 12 hours to see if this helps.  Patient was advised to call/return if symptoms persist or worsen.     Oral lesion Patient has  a tiny lesion to the left.  Oral mucosa; that she states has been there for quite some time.  She states that occasionally the lesion will grow and she is able to rupture the lesion to make it feel better.  She states she has seen her dentist for this issue; and he is unclear what this lesion is.  I asked the patient is her dentist has considered a biopsy of this lesion; but patient states that the dentist has not mentioned that to her as of yet.  Advised patient to follow back up with her dentist regarding a  questionable biopsy of the site.  There were no other oral lesions noted on exam.  Patient stated understanding of all instructions; and was in agreement with this plan of care. The patient knows to call the clinic with any problems, questions or concerns.   This was a shared visit with Dr. Julien Nordmann today.  Total time spent with patient was 25 minutes;  with greater than 75 percent of that time spent in face to face counseling regarding patient's symptoms,  and coordination of care and follow up.  Disclaimer:This dictation was prepared with Dragon/digital dictation along with [employer]. Any transcriptional errors that result from this process are unintentional.  Drue Second, NP 06/24/2015   Addendum: Hematology/Oncology Attending: I had a face to face encounter with the patient. I recommended her care plan. This is a very pleasant 59 years old white female recently diagnosed with a stage IIIa non-small cell lung cancer and currently undergoing a course of concurrent chemoradiation with weekly carboplatin and paclitaxel. The patient developed skin rash mainly on the chest abdomen and back that is very pruritic started after her treatment with chemotherapy. This is highly concerning for drug-induced skin rash. I recommended for the patient to start Medrol Dosepak and she will use Benadryl and Pepcid as needed. We will arrange for the patient come back for follow-up visit in one week for reevaluation and close monitoring of the skin rash. She was advised to call if she has any concerning symptoms in the interval.  Disclaimer: This note was dictated with voice recognition software. Similar sounding words can inadvertently be transcribed and may be missed upon review. Eilleen Kempf., MD 06/24/2015

## 2015-06-25 ENCOUNTER — Ambulatory Visit
Admission: RE | Admit: 2015-06-25 | Discharge: 2015-06-25 | Disposition: A | Discharge status: Home / Self Care | Payer: Managed Care, Other (non HMO) | Source: Ambulatory Visit | Attending: Radiation Oncology | Admitting: Radiation Oncology

## 2015-06-25 DIAGNOSIS — Z510 Encounter for antineoplastic radiation therapy: Secondary | ICD-10-CM | POA: Diagnosis not present

## 2015-06-26 ENCOUNTER — Encounter: Payer: Self-pay | Admitting: Radiation Oncology

## 2015-06-26 ENCOUNTER — Ambulatory Visit
Admission: RE | Admit: 2015-06-26 | Discharge: 2015-06-26 | Disposition: A | Discharge status: Home / Self Care | Payer: Managed Care, Other (non HMO) | Source: Ambulatory Visit | Attending: Radiation Oncology | Admitting: Radiation Oncology

## 2015-06-26 VITALS — BP 144/98 | HR 93 | Resp 16 | Wt 142.7 lb

## 2015-06-26 DIAGNOSIS — C3411 Malignant neoplasm of upper lobe, right bronchus or lung: Secondary | ICD-10-CM

## 2015-06-26 DIAGNOSIS — Z510 Encounter for antineoplastic radiation therapy: Secondary | ICD-10-CM | POA: Diagnosis not present

## 2015-06-26 NOTE — Progress Notes (Signed)
Weight stable. BP elevated. Patient tearful and emotional today. Denies pain with swallowing. Understand she can use Carafate should the pain with swallowing present again. Reports fatigue. Reports difficulty sleeping at night because of "a busy mind." Reports mild shortness of breath. Reports an increase in frequency of her dry cough. Reports scant bright red blood on her tissue when she blows her nose. Encouraged patient to humidify the air while she sleeps and apply vaseline just inside each nares before bed to moisture. Hyperpigmentation without desquamation within treatment field. Reports using SONAFINE as directed. Hives associated with chemo are resolving with prednisone.   BP 144/98 mmHg  Pulse 93  Resp 16  Wt 142 lb 11.2 oz (64.728 kg)  SpO2 99% Wt Readings from Last 3 Encounters:  06/26/15 142 lb 11.2 oz (64.728 kg)  06/23/15 143 lb 4.8 oz (65 kg)  06/22/15 143 lb 3.2 oz (64.955 kg)

## 2015-06-26 NOTE — Progress Notes (Signed)
  Radiation Oncology         [phone]   Name: Rose Figueroa MRN: [id number]   Date: 06/26/2015  DOB: Dec 07, 1955     Weekly Radiation Therapy Management    ICD-9-CM ICD-10-CM   1. Cancer of upper lobe of right lung (H[facility]) 162.3 C34.11     Current Dose: 38 Planned Dose:  66 Gy  Narrative The patient presents for routine under treatment assessment.  Weight stable. BP elevated. Patient tearful and emotional today. Denies pain with swallowing. Understand she can use Carafate should the pain with swallowing present again. Reports fatigue. Reports difficulty sleeping at night because of "a busy mind." Reports mild shortness of breath. Reports an increase in frequency of her dry cough. Reports scant bright red blood on her tissue when she blows her nose. Encouraged patient to humidify the air while she sleeps and apply vaseline just inside each nares before bed to moisturize. Hyperpigmentation without desquamation within treatment field. Reports using SONAFINE as directed. Hives associated with chemo are resolving with prednisone.   The patient is without complaint. Set-up films were reviewed. The chart was checked.  Physical Findings  weight is 142 lb 11.2 oz (64.728 kg). Her blood pressure is 144/98 and her pulse is 93. Her respiration is 16 and oxygen saturation is 99%. . Weight essentially stable.  No significant changes.   Impression The patient is tolerating radiation.  Plan Continue treatment as planned.       Sheral Apley Tammi Klippel, M.D.  This document serves as a record of services personally performed by Tyler Pita, MD. It was created on his behalf by Arlyce Harman, a trained medical scribe. The creation of this record is based on the scribe's personal observations and the provider's statements to them. This document has been checked and approved by the attending provider.

## 2015-06-29 ENCOUNTER — Ambulatory Visit (HOSPITAL_BASED_OUTPATIENT_CLINIC_OR_DEPARTMENT_OTHER): Payer: Managed Care, Other (non HMO)

## 2015-06-29 ENCOUNTER — Encounter: Payer: Self-pay | Admitting: *Deleted

## 2015-06-29 ENCOUNTER — Ambulatory Visit (HOSPITAL_BASED_OUTPATIENT_CLINIC_OR_DEPARTMENT_OTHER): Payer: Managed Care, Other (non HMO) | Admitting: Nurse Practitioner

## 2015-06-29 ENCOUNTER — Encounter: Payer: Self-pay | Admitting: Nurse Practitioner

## 2015-06-29 ENCOUNTER — Ambulatory Visit
Admission: RE | Admit: 2015-06-29 | Discharge: 2015-06-29 | Disposition: A | Discharge status: Home / Self Care | Payer: Managed Care, Other (non HMO) | Source: Ambulatory Visit | Attending: Radiation Oncology | Admitting: Radiation Oncology

## 2015-06-29 ENCOUNTER — Other Ambulatory Visit (HOSPITAL_BASED_OUTPATIENT_CLINIC_OR_DEPARTMENT_OTHER): Payer: Managed Care, Other (non HMO)

## 2015-06-29 VITALS — BP 126/78 | HR 76 | Temp 98.7°F | Resp 18 | Ht 63.0 in | Wt 141.4 lb

## 2015-06-29 DIAGNOSIS — Z5111 Encounter for antineoplastic chemotherapy: Secondary | ICD-10-CM

## 2015-06-29 DIAGNOSIS — R918 Other nonspecific abnormal finding of lung field: Secondary | ICD-10-CM

## 2015-06-29 DIAGNOSIS — C3411 Malignant neoplasm of upper lobe, right bronchus or lung: Secondary | ICD-10-CM | POA: Diagnosis not present

## 2015-06-29 DIAGNOSIS — Z510 Encounter for antineoplastic radiation therapy: Secondary | ICD-10-CM | POA: Diagnosis not present

## 2015-06-29 DIAGNOSIS — R21 Rash and other nonspecific skin eruption: Secondary | ICD-10-CM | POA: Diagnosis not present

## 2015-06-29 DIAGNOSIS — C3491 Malignant neoplasm of unspecified part of right bronchus or lung: Secondary | ICD-10-CM

## 2015-06-29 LAB — CBC WITH DIFFERENTIAL/PLATELET
BASO%: 1.1 % (ref 0.0–2.0)
BASOS ABS: 0.1 10*3/uL (ref 0.0–0.1)
EOS%: 2.4 % (ref 0.0–7.0)
Eosinophils Absolute: 0.1 10*3/uL (ref 0.0–0.5)
HEMATOCRIT: 40.4 % (ref 34.8–46.6)
HEMOGLOBIN: 13.9 g/dL (ref 11.6–15.9)
LYMPH#: 0.9 10*3/uL (ref 0.9–3.3)
LYMPH%: 18.9 % (ref 14.0–49.7)
MCH: 32.4 pg (ref 25.1–34.0)
MCHC: 34.3 g/dL (ref 31.5–36.0)
MCV: 94.4 fL (ref 79.5–101.0)
MONO#: 0.6 10*3/uL (ref 0.1–0.9)
MONO%: 13.9 % (ref 0.0–14.0)
NEUT#: 2.9 10*3/uL (ref 1.5–6.5)
NEUT%: 63.7 % (ref 38.4–76.8)
PLATELETS: 261 10*3/uL (ref 145–400)
RBC: 4.27 10*6/uL (ref 3.70–5.45)
RDW: 14 % (ref 11.2–14.5)
WBC: 4.5 10*3/uL (ref 3.9–10.3)

## 2015-06-29 LAB — COMPREHENSIVE METABOLIC PANEL
ALBUMIN: 4 g/dL (ref 3.5–5.0)
ALK PHOS: 40 U/L (ref 40–150)
ALT: 22 U/L (ref 0–55)
ANION GAP: 8 meq/L (ref 3–11)
AST: 18 U/L (ref 5–34)
BUN: 19 mg/dL (ref 7.0–26.0)
CALCIUM: 9.7 mg/dL (ref 8.4–10.4)
CO2: 26 mEq/L (ref 22–29)
CREATININE: 0.8 mg/dL (ref 0.6–1.1)
Chloride: 106 mEq/L (ref 98–109)
EGFR: 79 mL/min/{1.73_m2} — ABNORMAL LOW (ref >90)
Glucose: 82 mg/dl (ref 70–140)
Potassium: 4.3 mEq/L (ref 3.5–5.1)
Sodium: 139 mEq/L (ref 136–145)
Total Bilirubin: 0.41 mg/dL (ref 0.20–1.20)
Total Protein: 6.6 g/dL (ref 6.4–8.3)

## 2015-06-29 MED ORDER — DIPHENHYDRAMINE HCL 50 MG/ML IJ SOLN
25.0000 mg | Freq: Once | INTRAMUSCULAR | Status: AC
  Administered 2015-06-29: 25 mg via INTRAVENOUS

## 2015-06-29 MED ORDER — FAMOTIDINE IN NACL 20-0.9 MG/50ML-% IV SOLN
20.0000 mg | Freq: Once | INTRAVENOUS | Status: AC
  Administered 2015-06-29: 20 mg via INTRAVENOUS

## 2015-06-29 MED ORDER — SODIUM CHLORIDE 0.9 % IV SOLN
Freq: Once | INTRAVENOUS | Status: AC
  Administered 2015-06-29: 12:00:00 via INTRAVENOUS

## 2015-06-29 MED ORDER — SODIUM CHLORIDE 0.9 % IV SOLN
Freq: Once | INTRAVENOUS | Status: AC
  Administered 2015-06-29: 13:00:00 via INTRAVENOUS
  Filled 2015-06-29: qty 8

## 2015-06-29 MED ORDER — DIPHENHYDRAMINE HCL 50 MG/ML IJ SOLN
INTRAMUSCULAR | Status: AC
  Filled 2015-06-29: qty 1

## 2015-06-29 MED ORDER — PACLITAXEL CHEMO INJECTION 300 MG/50ML
45.0000 mg/m2 | Freq: Once | INTRAVENOUS | Status: AC
  Administered 2015-06-29: 78 mg via INTRAVENOUS
  Filled 2015-06-29: qty 13

## 2015-06-29 MED ORDER — FAMOTIDINE IN NACL 20-0.9 MG/50ML-% IV SOLN
INTRAVENOUS | Status: AC
  Filled 2015-06-29: qty 50

## 2015-06-29 MED ORDER — SODIUM CHLORIDE 0.9 % IV SOLN
204.4000 mg | Freq: Once | INTRAVENOUS | Status: AC
  Administered 2015-06-29: 200 mg via INTRAVENOUS
  Filled 2015-06-29: qty 20

## 2015-06-29 NOTE — Assessment & Plan Note (Signed)
Patient presented to the [facility] last week with complaint of a 24-48 hour history of progressive rash.  She stated that she had some mild to moderate erythema and some blister-like lesions to her chest from her radiation treatments; but this rash was different and was very pruritic.    Patient was given instructions to use Benadryl 25 mg every 6 hours , and Pepcid  20 mg every 12 hours for treatment of the rash.  She was also given a prescription for Medrol Dosepak.  Patient  Presents today for cycle 5 of her chemotherapy regimen.  She is happy to report that her rash is almost completely resolved.  She continues with some mild erythema to her upper chest from her radiation treatments.  She has been using the cream given to her by Rad Onc to her chest.

## 2015-06-29 NOTE — Assessment & Plan Note (Addendum)
Patient received cycle 4 of her carboplatin/paclitaxel chemotherapy on 06/22/2015.  She continues to undergo radiation treatments on a daily basis.  Patient presented to the [facility] last week with complaint of a 24-48 hour history of progressive rash.  She stated that she had some mild to moderate erythema and some blister-like lesions to her chest from her radiation treatments; but this rash was different and was very pruritic.    Patient was given instructions to use Benadryl 25 mg every 6 hours , and Pepcid  20 mg every 12 hours for treatment of the rash.  She was also given a prescription for Medrol Dosepak.  Patient  Presents today for cycle 5 of her chemotherapy regimen.  She is happy to report that her rash is almost completely resolved.  She continues with some mild erythema to her upper chest from her radiation treatments.  She has been using the cream given to her by Rad Onc to her chest.    blood counts obtained today revealed a WBC of 4.5, ANC 2.9, hemoglobin 13.9, and platelet count 261.   Patient will proceed today with cycle 5 of her chemotherapy as planned.  Patient is scheduled to return on 07/03/2015 for Port-A-Cath placement.   Patient is scheduled to return for labs and chemotherapy on 07/06/2015.  She will also visit with the nutritionist at that time as well.   Patient is scheduled for labs, visit, and chemotherapy again on 07/13/2015.

## 2015-06-29 NOTE — Progress Notes (Signed)
Oncology Nurse Navigator Documentation  Oncology Nurse Navigator Flowsheets 06/29/2015  Navigator Encounter Type Treatment/spoke with patient and husband today during tx.  They are both doing well.  No barriers identified at this time.   Patient Visit Type Medonc  Treatment Phase Treatment  Barriers/Navigation Needs No barriers at this time  Time Spent with Patient 15

## 2015-06-29 NOTE — Patient Instructions (Signed)
[facility] Discharge Instructions for Patients Receiving Chemotherapy  Today you received the following chemotherapy agents Taxol/Carboplatin.  To help prevent nausea and vomiting after your treatment, we encourage you to take your nausea medication as directed.   If you develop nausea and vomiting that is not controlled by your nausea medication, call the clinic.   BELOW ARE SYMPTOMS THAT SHOULD BE REPORTED IMMEDIATELY:  *FEVER GREATER THAN 100.5 F  *CHILLS WITH OR WITHOUT FEVER  NAUSEA AND VOMITING THAT IS NOT CONTROLLED WITH YOUR NAUSEA MEDICATION  *UNUSUAL SHORTNESS OF BREATH  *UNUSUAL BRUISING OR BLEEDING  TENDERNESS IN MOUTH AND THROAT WITH OR WITHOUT PRESENCE OF ULCERS  *URINARY PROBLEMS  *BOWEL PROBLEMS  UNUSUAL RASH Items with * indicate a potential emergency and should be followed up as soon as possible.  Feel free to call the clinic you have any questions or concerns. The clinic phone number is [phone].  Please show the [facility] at check-in to the Emergency Department and triage nurse.

## 2015-06-29 NOTE — Progress Notes (Signed)
SYMPTOM MANAGEMENT CLINIC   HPI: Rose Figueroa 59 y.o. female diagnosed with lung cancer.  Currently undergoing carboplatin/paclitaxel chemotherapy regimen and radiation treatments. Patient received cycle 4 of her carboplatin/paclitaxel chemotherapy on 06/22/2015.  She continues to undergo radiation treatments on a daily basis.  Patient presented to the [facility] last week with complaint of a 24-48 hour history of progressive rash.  She stated that she had some mild to moderate erythema and some blister-like lesions to her chest from her radiation treatments; but this rash was different and was very pruritic.    Patient was given instructions to use Benadryl 25 mg every 6 hours , and Pepcid  20 mg every 12 hours for treatment of the rash.  She was also given a prescription for Medrol Dosepak.  Patient  Presents today for cycle 5 of her chemotherapy regimen.  She is happy to report that her rash is almost completely resolved.  She continues with some mild erythema to her upper chest from her radiation treatments.  She has been using the cream given to her by Rad Onc to her chest.   Rash    Review of Systems  Skin: Positive for rash.    Past Medical History  Diagnosis Date  . Osteoporosis   . H/O cold sores   . Diverticulosis   . Rectal bleeding   . BCC (basal cell carcinoma of skin)   . right upper lobe lung mass 04/15/2015  . Back pain   . Colon polyps   . Complication of anesthesia     pt. states that she is difficult to arouse  . PONV (postoperative nausea and vomiting)   . Dysrhythmia   . Anxiety   . Complication of anesthesia   . History of anemia     Past Surgical History  Procedure Laterality Date  . Cystectomy      right breast x 2  . Colonoscopy    . Colonoscopy w/ polypectomy    . Video bronchoscopy with endobronchial ultrasound N/A 05/07/2015    Procedure: VIDEO BRONCHOSCOPY WITH ENDOBRONCHIAL ULTRASOUND;  Surgeon: Ivin Poot, MD;  Location: [facility];  Service: Thoracic;  Laterality: N/A;  . Mediastinoscopy N/A 05/07/2015    Procedure: MEDIASTINOSCOPY;  Surgeon: Ivin Poot, MD;  Location: [facility];  Service: Thoracic;  Laterality: N/A;    has Cancer of upper lobe of right lung ([facility]); Encounter for antineoplastic chemotherapy; Generalized rash; and Oral lesion on her problem list.    has No Known Allergies.    Medication List       This list is accurate as of: 06/29/15 12:19 PM.  Always use your most recent med list.               Biotin 5000 MCG Caps  Take 5,000 mcg by mouth daily.     CALCIUM 1000 + D PO  Take 6 tablets by mouth daily. D3 = 200 IU per each.     CENTRUM SILVER PO  Take 1 tablet by mouth daily.     L-LYSINE PO  Take 4,000 mg by mouth daily.     lidocaine-prilocaine cream  Commonly known as:  EMLA  Apply 1 application topically as needed.     Melatonin 3 MG Caps  Take 3 mg by mouth once.     prochlorperazine 10 MG tablet  Commonly known as:  COMPAZINE  Take 1 tablet (10 mg total) by mouth every 6 (six) hours as needed for nausea or  vomiting.     RA FISH OIL 1400 MG Cpdr  Take 2 capsules by mouth daily.     SONAFINE  Apply 1 application topically 3 (three) times daily.     Spirulina 500 MG Tabs  Take by mouth daily.     sucralfate 1 GM/10ML suspension  Commonly known as:  CARAFATE  Take 10 mLs (1 g total) by mouth 4 (four) times daily -  with meals and at bedtime.     TART CHERRY ADVANCED PO  Take 1,200 mg by mouth daily.     Turmeric Curcumin 500 MG Caps  Take 2 each by mouth daily.     vitamin B-12 1000 MCG tablet  Commonly known as:  CYANOCOBALAMIN  Take 1,000 mcg by mouth daily.         PHYSICAL EXAMINATION  Oncology Vitals 06/29/2015 06/26/2015  Height 160 cm -  Weight 64.139 kg 64.728 kg  Weight (lbs) 141 lbs 6 oz 142 lbs 11 oz  BMI (kg/m2) 25.05 kg/m2 -  Temp 98.7 -  Pulse 76 93  Resp 18 16  SpO2 100 99  BSA (m2) 1.69 m2 -   BP Readings from Last 2 Encounters:   06/29/15 126/78  06/26/15 144/98    Physical Exam  Constitutional: She is oriented to person, place, and time and well-developed, well-nourished, and in no distress.  HENT:  Head: Normocephalic and atraumatic.  Mouth/Throat: Oropharynx is clear and moist.  Patient has one tiny lesion to her left oral mucosa that does not appear inflamed or infected.  It is nontender with exam.  Eyes: Conjunctivae and EOM are normal. Pupils are equal, round, and reactive to light. Right eye exhibits no discharge. Left eye exhibits no discharge. No scleral icterus.  Neck: Normal range of motion.  Pulmonary/Chest: Effort normal. No respiratory distress.  Musculoskeletal: Normal range of motion. She exhibits no edema or tenderness.  Neurological: She is alert and oriented to person, place, and time. Gait normal.  Skin: Skin is warm and dry. Rash noted. There is erythema. No pallor.  On exam.-Patient with erythema and some healed lesions to her anterior chest which are most likely secondary to her radiation treatments.   Generalized rash has always completely resolved on exam.    Psychiatric: Affect normal.  Nursing note and vitals reviewed.  LABORATORY DATA:. Appointment on 06/29/2015  Component Date Value Ref Range Status  . WBC 06/29/2015 4.5  3.9 - 10.3 10e3/uL Final  . NEUT# 06/29/2015 2.9  1.5 - 6.5 10e3/uL Final  . HGB 06/29/2015 13.9  11.6 - 15.9 g/dL Final  . HCT 06/29/2015 40.4  34.8 - 46.6 % Final  . Platelets 06/29/2015 261  145 - 400 10e3/uL Final  . MCV 06/29/2015 94.4  79.5 - 101.0 fL Final  . MCH 06/29/2015 32.4  25.1 - 34.0 pg Final  . MCHC 06/29/2015 34.3  31.5 - 36.0 g/dL Final  . RBC 06/29/2015 4.27  3.70 - 5.45 10e6/uL Final  . RDW 06/29/2015 14.0  11.2 - 14.5 % Final  . lymph# 06/29/2015 0.9  0.9 - 3.3 10e3/uL Final  . MONO# 06/29/2015 0.6  0.1 - 0.9 10e3/uL Final  . Eosinophils Absolute 06/29/2015 0.1  0.0 - 0.5 10e3/uL Final  . Basophils Absolute 06/29/2015 0.1  0.0 - 0.1  10e3/uL Final  . NEUT% 06/29/2015 63.7  38.4 - 76.8 % Final  . LYMPH% 06/29/2015 18.9  14.0 - 49.7 % Final  . MONO% 06/29/2015 13.9  0.0 - 14.0 % Final  .  EOS% 06/29/2015 2.4  0.0 - 7.0 % Final  . BASO% 06/29/2015 1.1  0.0 - 2.0 % Final  . Sodium 06/29/2015 139  136 - 145 mEq/L Final  . Potassium 06/29/2015 4.3  3.5 - 5.1 mEq/L Final  . Chloride 06/29/2015 106  98 - 109 mEq/L Final  . CO2 06/29/2015 26  22 - 29 mEq/L Final  . Glucose 06/29/2015 82  70 - 140 mg/dl Final   Glucose reference range is for nonfasting patients. Fasting glucose reference range is 70- 100.  Marland Kitchen BUN 06/29/2015 19.0  7.0 - 26.0 mg/dL Final  . Creatinine 06/29/2015 0.8  0.6 - 1.1 mg/dL Final  . Total Bilirubin 06/29/2015 0.41  0.20 - 1.20 mg/dL Final  . Alkaline Phosphatase 06/29/2015 40  40 - 150 U/L Final  . AST 06/29/2015 18  5 - 34 U/L Final  . ALT 06/29/2015 22  0 - 55 U/L Final  . Total Protein 06/29/2015 6.6  6.4 - 8.3 g/dL Final  . Albumin 06/29/2015 4.0  3.5 - 5.0 g/dL Final  . Calcium 06/29/2015 9.7  8.4 - 10.4 mg/dL Final  . Anion Gap 06/29/2015 8  3 - 11 mEq/L Final  . EGFR 06/29/2015 79* >90 ml/min/1.73 m2 Final   eGFR is calculated using the CKD-EPI Creatinine Equation (2009)     RADIOGRAPHIC STUDIES: No results found.  ASSESSMENT/PLAN:    Cancer of upper lobe of right lung [facility]) Patient received cycle 4 of her carboplatin/paclitaxel chemotherapy on 06/22/2015.  She continues to undergo radiation treatments on a daily basis.  Patient presented to the [facility] last week with complaint of a 24-48 hour history of progressive rash.  She stated that she had some mild to moderate erythema and some blister-like lesions to her chest from her radiation treatments; but this rash was different and was very pruritic.    Patient was given instructions to use Benadryl 25 mg every 6 hours , and Pepcid  20 mg every 12 hours for treatment of the rash.  She was also given a prescription for Medrol  Dosepak.  Patient  Presents today for cycle 5 of her chemotherapy regimen.  She is happy to report that her rash is almost completely resolved.  She continues with some mild erythema to her upper chest from her radiation treatments.  She has been using the cream given to her by Rad Onc to her chest.    blood counts obtained today revealed a WBC of 4.5, ANC 2.9, hemoglobin 13.9, and platelet count 261.   Patient will proceed today with cycle 5 of her chemotherapy as planned.  Patient is scheduled to return on 07/03/2015 for Port-A-Cath placement.   Patient is scheduled to return for labs and chemotherapy on 07/06/2015.  She will also visit with the nutritionist at that time as well.   Patient is scheduled for labs, visit, and chemotherapy again on 07/13/2015.          Generalized rash Patient presented to the [facility] last week with complaint of a 24-48 hour history of progressive rash.  She stated that she had some mild to moderate erythema and some blister-like lesions to her chest from her radiation treatments; but this rash was different and was very pruritic.    Patient was given instructions to use Benadryl 25 mg every 6 hours , and Pepcid  20 mg every 12 hours for treatment of the rash.  She was also given a prescription for Medrol Dosepak.  Patient  Presents today for cycle 5 of her chemotherapy regimen.  She is happy to report that her rash is almost completely resolved.  She continues with some mild erythema to her upper chest from her radiation treatments.  She has been using the cream given to her by Rad Onc to her chest.                  Patient stated understanding of all instructions; and was in agreement with this plan of care. The patient knows to call the clinic with any problems, questions or concerns.   All details of today's visit were reviewed with Dr. Julien Nordmann.   Total time spent with patient was 25 minutes;  with greater than 75 percent of  that time spent in face to face counseling regarding patient's symptoms,  and coordination of care and follow up.  Disclaimer:This dictation was prepared with Dragon/digital dictation along with [employer]. Any transcriptional errors that result from this process are unintentional.  Drue Second, NP 06/29/2015

## 2015-06-30 ENCOUNTER — Other Ambulatory Visit: Payer: Self-pay | Admitting: Radiology

## 2015-06-30 ENCOUNTER — Ambulatory Visit
Admission: RE | Admit: 2015-06-30 | Discharge: 2015-06-30 | Disposition: A | Discharge status: Home / Self Care | Payer: Managed Care, Other (non HMO) | Source: Ambulatory Visit | Attending: Radiation Oncology | Admitting: Radiation Oncology

## 2015-06-30 DIAGNOSIS — Z510 Encounter for antineoplastic radiation therapy: Secondary | ICD-10-CM | POA: Diagnosis not present

## 2015-07-01 ENCOUNTER — Ambulatory Visit
Admission: RE | Admit: 2015-07-01 | Discharge: 2015-07-01 | Disposition: A | Discharge status: Home / Self Care | Payer: Managed Care, Other (non HMO) | Source: Ambulatory Visit | Attending: Radiation Oncology | Admitting: Radiation Oncology

## 2015-07-01 DIAGNOSIS — Z510 Encounter for antineoplastic radiation therapy: Secondary | ICD-10-CM | POA: Diagnosis not present

## 2015-07-02 ENCOUNTER — Ambulatory Visit
Admission: RE | Admit: 2015-07-02 | Discharge: 2015-07-02 | Disposition: A | Discharge status: Home / Self Care | Payer: Managed Care, Other (non HMO) | Source: Ambulatory Visit | Attending: Radiation Oncology | Admitting: Radiation Oncology

## 2015-07-02 VITALS — BP 115/85 | HR 85 | Temp 97.7°F | Resp 16 | Wt 144.5 lb

## 2015-07-02 DIAGNOSIS — Z510 Encounter for antineoplastic radiation therapy: Secondary | ICD-10-CM | POA: Diagnosis not present

## 2015-07-02 DIAGNOSIS — C3411 Malignant neoplasm of upper lobe, right bronchus or lung: Secondary | ICD-10-CM

## 2015-07-02 NOTE — Progress Notes (Signed)
PAIN: She is currently in no pain.  RESPIRATORY: Coughing  Dry. Pt is on room air. Noted erythema, Dryness and Pruritus-upperchest/clavicle SWALLOWING/DIET: Pt has had dysphagia for both solids and liquids nothing has become stuck, but feels a lump sensation when swallowing. Uses Carafate as needed. OTHER: Completed steriods on Saturday, Oral exam reveals a moist mouth.  She complains of dry mouth. BP 115/85 mmHg  Pulse 85  Temp(Src) 97.7 F (36.5 C) (Oral)  Resp 16  Wt 144 lb 8 oz (65.545 kg)  SpO2 100% Wt Readings from Last 3 Encounters:  07/02/15 144 lb 8 oz (65.545 kg)  06/29/15 141 lb 6.4 oz (64.139 kg)  06/26/15 142 lb 11.2 oz (64.728 kg)

## 2015-07-02 NOTE — Progress Notes (Signed)
  Radiation Oncology         [phone]   Name: Rose Figueroa MRN: [id number]   Date: 07/02/2015  DOB: 1956-01-19     Weekly Radiation Therapy Management    ICD-9-CM ICD-10-CM   1. Cancer of upper lobe of right lung (HCC) 162.3 C34.11     Current Dose: 46 Planned Dose:  66 Gy  Narrative The patient presents for routine under treatment assessment.  The patient is currently in no pain. She has a dry cough and is on room air. She has some erythema. She has some dryness and pruritis of the upper chest and clavicle. She has been using lotion on her chest. She has had dysphagia for both solids and liquids nothing has become stuck, but feels a lump sensation when swallowing. She uses Carafate as needed. She completed steroids on Saturday and an oral exam from the nurse reveals a moist mouth. She complains of a dry mouth. She has a good appetite and continues to eat and drink. She mentions that her bronchial tubes feel better and less heavy.   The patient is without complaint. Set-up films were reviewed. The chart was checked.  Physical Findings  weight is 144 lb 8 oz (65.545 kg). Her oral temperature is 97.7 F (36.5 C). Her blood pressure is 115/85 and her pulse is 85. Her respiration is 16 and oxygen saturation is 100%. . Weight essentially stable.  No significant changes.   Impression The patient is tolerating radiation.  Plan Continue treatment as planned.       Sheral Apley Tammi Klippel, M.D.  This document serves as a record of services personally performed by Tyler Pita, MD. It was created on his behalf by Lendon Collar, a trained medical scribe. The creation of this record is based on the scribe's personal observations and the provider's statements to them. This document has been checked and approved by the attending provider.

## 2015-07-03 ENCOUNTER — Other Ambulatory Visit: Payer: Self-pay | Admitting: Internal Medicine

## 2015-07-03 ENCOUNTER — Ambulatory Visit (HOSPITAL_COMMUNITY)
Admission: RE | Admit: 2015-07-03 | Discharge: 2015-07-03 | Disposition: A | Discharge status: Home / Self Care | Payer: Managed Care, Other (non HMO) | Source: Ambulatory Visit | Attending: Internal Medicine | Admitting: Internal Medicine

## 2015-07-03 ENCOUNTER — Ambulatory Visit
Admission: RE | Admit: 2015-07-03 | Discharge: 2015-07-03 | Disposition: A | Discharge status: Home / Self Care | Payer: Managed Care, Other (non HMO) | Source: Ambulatory Visit | Attending: Radiation Oncology | Admitting: Radiation Oncology

## 2015-07-03 ENCOUNTER — Encounter (HOSPITAL_COMMUNITY): Payer: Self-pay

## 2015-07-03 DIAGNOSIS — Z5111 Encounter for antineoplastic chemotherapy: Secondary | ICD-10-CM

## 2015-07-03 DIAGNOSIS — C3491 Malignant neoplasm of unspecified part of right bronchus or lung: Secondary | ICD-10-CM | POA: Diagnosis not present

## 2015-07-03 DIAGNOSIS — C3411 Malignant neoplasm of upper lobe, right bronchus or lung: Secondary | ICD-10-CM

## 2015-07-03 DIAGNOSIS — Z79899 Other long term (current) drug therapy: Secondary | ICD-10-CM | POA: Diagnosis not present

## 2015-07-03 DIAGNOSIS — Z510 Encounter for antineoplastic radiation therapy: Secondary | ICD-10-CM | POA: Diagnosis not present

## 2015-07-03 LAB — CBC
HCT: 37.4 % (ref 36.0–46.0)
Hemoglobin: 12.5 g/dL (ref 12.0–15.0)
MCH: 31.1 pg (ref 26.0–34.0)
MCHC: 33.4 g/dL (ref 30.0–36.0)
MCV: 93 fL (ref 78.0–100.0)
PLATELETS: 212 10*3/uL (ref 150–400)
RBC: 4.02 MIL/uL (ref 3.87–5.11)
RDW: 14.1 % (ref 11.5–15.5)
WBC: 2.7 10*3/uL — ABNORMAL LOW (ref 4.0–10.5)

## 2015-07-03 LAB — BASIC METABOLIC PANEL
Anion gap: 6 (ref 5–15)
BUN: 15 mg/dL (ref 6–20)
CO2: 26 mmol/L (ref 22–32)
Calcium: 9.3 mg/dL (ref 8.9–10.3)
Chloride: 107 mmol/L (ref 101–111)
Creatinine, Ser: 0.56 mg/dL (ref 0.44–1.00)
GFR calc Af Amer: >60 mL/min (ref >60)
GFR calc non Af Amer: >60 mL/min (ref >60)
Glucose, Bld: 94 mg/dL (ref 65–99)
Potassium: 4.3 mmol/L (ref 3.5–5.1)
Sodium: 139 mmol/L (ref 135–145)

## 2015-07-03 LAB — PROTIME-INR
INR: 1.04 (ref 0.00–1.49)
PROTHROMBIN TIME: 13.8 s (ref 11.6–15.2)

## 2015-07-03 LAB — APTT: aPTT: 27 seconds (ref 24–37)

## 2015-07-03 MED ORDER — LIDOCAINE-EPINEPHRINE 2 %-1:100000 IJ SOLN
INTRAMUSCULAR | Status: AC
  Filled 2015-07-03: qty 1

## 2015-07-03 MED ORDER — MIDAZOLAM HCL 2 MG/2ML IJ SOLN
INTRAMUSCULAR | Status: AC | PRN
  Administered 2015-07-03 (×4): 1 mg via INTRAVENOUS

## 2015-07-03 MED ORDER — HEPARIN SOD (PORK) LOCK FLUSH 100 UNIT/ML IV SOLN
INTRAVENOUS | Status: AC
  Filled 2015-07-03: qty 5

## 2015-07-03 MED ORDER — LIDOCAINE HCL 1 % IJ SOLN
INTRAMUSCULAR | Status: DC
Start: 2015-07-03 — End: 2015-07-04
  Filled 2015-07-03: qty 20

## 2015-07-03 MED ORDER — CEFAZOLIN SODIUM-DEXTROSE 2-3 GM-% IV SOLR
2.0000 g | INTRAVENOUS | Status: AC
  Administered 2015-07-03: 2 g via INTRAVENOUS

## 2015-07-03 MED ORDER — SODIUM CHLORIDE 0.9 % IV SOLN
Freq: Once | INTRAVENOUS | Status: AC
  Administered 2015-07-03: 12:00:00 via INTRAVENOUS

## 2015-07-03 MED ORDER — FENTANYL CITRATE (PF) 100 MCG/2ML IJ SOLN
INTRAMUSCULAR | Status: AC | PRN
  Administered 2015-07-03: 50 ug via INTRAVENOUS

## 2015-07-03 MED ORDER — MIDAZOLAM HCL 2 MG/2ML IJ SOLN
INTRAMUSCULAR | Status: AC
  Filled 2015-07-03: qty 6

## 2015-07-03 MED ORDER — CEFAZOLIN SODIUM-DEXTROSE 2-3 GM-% IV SOLR
INTRAVENOUS | Status: AC
  Filled 2015-07-03: qty 50

## 2015-07-03 MED ORDER — HEPARIN SOD (PORK) LOCK FLUSH 100 UNIT/ML IV SOLN
INTRAVENOUS | Status: AC | PRN
  Administered 2015-07-03: 500 [IU]

## 2015-07-03 MED ORDER — FENTANYL CITRATE (PF) 100 MCG/2ML IJ SOLN
INTRAMUSCULAR | Status: AC
  Filled 2015-07-03: qty 4

## 2015-07-03 NOTE — Procedures (Signed)
Interventional Radiology Procedure Note  Indication: 59 yo female with non-small cell CA of right upper lobe.  Port will be placed left side.   Procedure: Placement of a right IJ approach single lumen PowerPort.  Tip is positioned at the superior cavoatrial junction and catheter is ready for immediate use.  Complications: None Recommendations:  - Ok to shower tomorrow - Do not submerge for 7 days - Routine line care   Signed,  Dulcy Fanny. Earleen Newport, DO

## 2015-07-03 NOTE — H&P (Signed)
HPI:  The patient has had a H&P performed within the last 30 days, all history, medications, and exam have been reviewed. The patient denies any interval changes since the H&P.   She is a 59 year old female receiving chemoradiation therapy for small cell CA.  She has been referred for a port catheter placement.    The patient request that we place the catheter on the left, as she has irritation of the skin on the anterior right chest wall secondary to her treatment portal.    Medications: Prior to Admission medications   Medication Sig Start Date End Date Taking? Authorizing Provider  Biotin 5000 MCG CAPS Take 5,000 mcg by mouth daily.   Yes Historical Provider, MD  Calcium Carb-Cholecalciferol (CALCIUM 1000 + D PO) Take 6 tablets by mouth daily. D3 = 200 IU per each.   Yes Historical Provider, MD  L-LYSINE PO Take 4,000 mg by mouth daily.    Yes Historical Provider, MD  Melatonin 3 MG CAPS Take 3 mg by mouth once.   Yes Historical Provider, MD  Misc Natural Products (TART CHERRY ADVANCED PO) Take 1,200 mg by mouth daily.   Yes Historical Provider, MD  Multiple Vitamins-Minerals (CENTRUM SILVER PO) Take 1 tablet by mouth daily.   Yes Historical Provider, MD  Omega-3 Fatty Acids (RA FISH OIL) 1400 MG CPDR Take 2 capsules by mouth daily.    Yes Historical Provider, MD  prochlorperazine (COMPAZINE) 10 MG tablet Take 1 tablet (10 mg total) by mouth every 6 (six) hours as needed for nausea or vomiting. 06/01/15  Yes Curt Bears, MD  Spirulina 500 MG TABS Take by mouth daily.   Yes Historical Provider, MD  sucralfate (CARAFATE) 1 GM/10ML suspension Take 10 mLs (1 g total) by mouth 4 (four) times daily -  with meals and at bedtime. 06/12/15  Yes Tyler Pita, MD  Turmeric Curcumin 500 MG CAPS Take 2 each by mouth daily.    Yes Historical Provider, MD  vitamin B-12 (CYANOCOBALAMIN) 1000 MCG tablet Take 1,000 mcg by mouth daily.   Yes Historical Provider, MD  lidocaine-prilocaine (EMLA) cream  Apply 1 application topically as needed. Patient not taking: Reported on 06/23/2015 06/22/15   Curt Bears, MD  Wound Dressings [facility]) Apply 1 application topically 3 (three) times daily.    Historical Provider, MD     Vital Signs: BP 122/82 mmHg  Pulse 74  Temp(Src) 98 F (36.7 C) (Oral)  Resp 16  SpO2 100%  Physical Exam  Erythema on the anterior right chest wall at the superior aspect, with no drainage.  A&O x 3. Appropriate affect.   Mallampati Score:  2  Labs:  CBC:  Recent Labs  06/15/15 1109 06/22/15 1121 06/29/15 0935 07/03/15 1200  WBC 3.4* 3.1* 4.5 2.7*  HGB 13.4 13.2 13.9 12.5  HCT 39.3 40.2 40.4 37.4  PLT 251 222 261 212    COAGS:  Recent Labs  04/30/15 1336 07/03/15 1200  INR 0.95 1.04  APTT 28 27    BMP:  Recent Labs  04/30/15 1336  06/15/15 1109 06/22/15 1121 06/29/15 0935 07/03/15 1200  NA 138  < > 136 139 139 139  K 4.0  < > 4.0 4.5 4.3 4.3  CL 103  --   --   --   --  107  CO2 28  < > '24 24 26 26  '$ GLUCOSE 96  < > 91 79 82 94  BUN 10  < > 10.2 12.8 19.0 15  CALCIUM 9.8  < > 9.4 9.6 9.7 9.3  CREATININE 0.76  < > 0.7 0.7 0.8 0.56  GFRNONAA >60  --   --   --   --  >60  GFRAA >60  --   --   --   --  >60  < > = values in this interval not displayed.  LIVER FUNCTION TESTS:  Recent Labs  06/08/15 1027 06/15/15 1109 06/22/15 1121 06/29/15 0935  BILITOT 0.34 <0.30 0.42 0.41  AST '20 23 26 18  '$ ALT '19 25 28 22  '$ ALKPHOS 34* 36* 40 40  PROT 6.4 6.2* 6.6 6.6  ALBUMIN 3.9 4.0 3.9 4.0    Assessment/Plan:   Plan is for a left sided port catheter.    SignedCorrie Mckusick 07/03/2015, 1:53 PM

## 2015-07-03 NOTE — Discharge Instructions (Signed)
Moderate Conscious Sedation, Adult Sedation is the use of medicines to promote relaxation and relieve discomfort and anxiety. Moderate conscious sedation is a type of sedation. Under moderate conscious sedation you are less alert than normal but are still able to respond to instructions or stimulation. Moderate conscious sedation is used during short medical and dental procedures. It is milder than deep sedation or general anesthesia and allows you to return to your regular activities sooner. LET [facility] CARE PROVIDER KNOW ABOUT:   Any allergies you have.  All medicines you are taking, including vitamins, herbs, eye drops, creams, and over-the-counter medicines.  Use of steroids (by mouth or creams).  Previous problems you or members of your family have had with the use of anesthetics.  Any blood disorders you have.  Previous surgeries you have had.  Medical conditions you have.  Possibility of pregnancy, if this applies.  Use of cigarettes, alcohol, or illegal drugs. RISKS AND COMPLICATIONS Generally, this is a safe procedure. However, as with any procedure, problems can occur. Possible problems include:  Oversedation.  Trouble breathing on your own. You may need to have a breathing tube until you are awake and breathing on your own.  Allergic reaction to any of the medicines used for the procedure. BEFORE THE PROCEDURE  You may have blood tests done. These tests can help show how well your kidneys and liver are working. They can also show how well your blood clots.  A physical exam will be done.  Only take medicines as directed by your health care provider. You may need to stop taking medicines (such as blood thinners, aspirin, or nonsteroidal anti-inflammatory drugs) before the procedure.   Do not eat or drink at least 6 hours before the procedure or as directed by your health care provider.  Arrange for a responsible adult, family member, or friend to take you home  after the procedure. He or she should stay with you for at least 24 hours after the procedure, until the medicine has worn off. PROCEDURE   An intravenous (IV) catheter will be inserted into one of your veins. Medicine will be able to flow directly into your body through this catheter. You may be given medicine through this tube to help prevent pain and help you relax.  The medical or dental procedure will be done. AFTER THE PROCEDURE  You will stay in a recovery area until the medicine has worn off. Your blood pressure and pulse will be checked.   Depending on the procedure you had, you may be allowed to go home when you can tolerate liquids and your pain is under control.   This information is not intended to replace advice given to you by your health care provider. Make sure you discuss any questions you have with your health care provider.   Document Released: 04/05/2001 Document Revised: 08/01/2014 Document Reviewed: 03/18/2013 Elsevier Interactive Patient Education 2016 [employer] Moderate Conscious Sedation, Adult, Care After Refer to this sheet in the next few weeks. These instructions provide you with information on caring for yourself after your procedure. Your health care provider may also give you more specific instructions. Your treatment has been planned according to current medical practices, but problems sometimes occur. Call your health care provider if you have any problems or questions after your procedure. WHAT TO EXPECT AFTER THE PROCEDURE  After your procedure:  You may feel sleepy, clumsy, and have poor balance for several hours.  Vomiting may occur if you eat too soon  after the procedure. HOME CARE INSTRUCTIONS  Do not participate in any activities where you could become injured for at least 24 hours. Do not:  Drive.  Swim.  Ride a bicycle.  Operate heavy machinery.  Cook.  Use power tools.  Climb ladders.  Work from a high place.  Do not make  important decisions or sign legal documents until you are improved.  If you vomit, drink water, juice, or soup when you can drink without vomiting. Make sure you have little or no nausea before eating solid foods.  Only take over-the-counter or prescription medicines for pain, discomfort, or fever as directed by your health care provider.  Make sure you and your family fully understand everything about the medicines given to you, including what side effects may occur.  You should not drink alcohol, take sleeping pills, or take medicines that cause drowsiness for at least 24 hours.  If you smoke, do not smoke without supervision.  If you are feeling better, you may resume normal activities 24 hours after you were sedated.  Keep all appointments with your health care provider. SEEK MEDICAL CARE IF:  Your skin is pale or bluish in color.  You continue to feel nauseous or vomit.  Your pain is getting worse and is not helped by medicine.  You have bleeding or swelling.  You are still sleepy or feeling clumsy after 24 hours. SEEK IMMEDIATE MEDICAL CARE IF:  You develop a rash.  You have difficulty breathing.  You develop any type of allergic problem.  You have a fever. MAKE SURE YOU:  Understand these instructions.  Will watch your condition.  Will get help right away if you are not doing well or get worse.   This information is not intended to replace advice given to you by your health care provider. Make sure you discuss any questions you have with your health care provider.   Document Released: 05/01/2013 Document Revised: 08/01/2014 Document Reviewed: 05/01/2013 Elsevier Interactive Patient Education 2016 [place] Insertion, Care After Refer to this sheet in the next few weeks. These instructions provide you with information on caring for yourself after your procedure. Your health care provider may also give you more specific instructions. Your  treatment has been planned according to current medical practices, but problems sometimes occur. Call your health care provider if you have any problems or questions after your procedure. WHAT TO EXPECT AFTER THE PROCEDURE After your procedure, it is typical to have the following:   Discomfort at the port insertion site. Ice packs to the area will help.  Bruising on the skin over the port. This will subside in 3-4 days. HOME CARE INSTRUCTIONS  After your port is placed, you will get a manufacturer's information card. The card has information about your port. Keep this card with you at all times.   Know what kind of port you have. There are many types of ports available.   Wear a medical alert bracelet in case of an emergency. This can help alert health care workers that you have a port.   The port can stay in for as long as your health care provider believes it is necessary.   A home health care nurse may give medicines and take care of the port.   You or a family member can get special training and directions for giving medicine and taking care of the port at home.   Remove dressing and shower 24 to 48 hours post procedure.  Keep wound clean and dry.   SEEK MEDICAL CARE IF:   Your port does not flush or you are unable to get a blood return.   You have a fever or chills. SEEK IMMEDIATE MEDICAL CARE IF:  You have new fluid or pus coming from your incision.   You notice a bad smell coming from your incision site.   You have swelling, pain, or more redness at the incision or port site.   You have chest pain or shortness of breath.   This information is not intended to replace advice given to you by your health care provider. Make sure you discuss any questions you have with your health care provider.   Document Released: 05/01/2013 Document Revised: 07/16/2013 Document Reviewed: 05/01/2013 Elsevier Interactive Patient Education 2016 [place] An implanted port is a type of central line that is placed under the skin. Central lines are used to provide IV access when treatment or nutrition needs to be given through a person's veins. Implanted ports are used for long-term IV access. An implanted port may be placed because:   You need IV medicine that would be irritating to the small veins in your hands or arms.   You need long-term IV medicines, such as antibiotics.   You need IV nutrition for a long period.   You need frequent blood draws for lab tests.   You need dialysis.  Implanted ports are usually placed in the chest area, but they can also be placed in the upper arm, the abdomen, or the leg. An implanted port has two main parts:   Reservoir. The reservoir is round and will appear as a small, raised area under your skin. The reservoir is the part where a needle is inserted to give medicines or draw blood.   Catheter. The catheter is a thin, flexible tube that extends from the reservoir. The catheter is placed into a large vein. Medicine that is inserted into the reservoir goes into the catheter and then into the vein.  HOW WILL I CARE FOR MY INCISION SITE? Do not get the incision site wet. Bathe or shower as directed by your health care provider.  HOW IS MY PORT ACCESSED? Special steps must be taken to access the port:   Before the port is accessed, a numbing cream can be placed on the skin. This helps numb the skin over the port site.   Your health care provider uses a sterile technique to access the port.  Your health care provider must put on a mask and sterile gloves.  The skin over your port is cleaned carefully with an antiseptic and allowed to dry.  The port is gently pinched between sterile gloves, and a needle is inserted into the port.  Only "non-coring" port needles should be used to access the port. Once the port is accessed, a blood return should be checked. This helps ensure that the port is  in the vein and is not clogged.   If your port needs to remain accessed for a constant infusion, a clear (transparent) bandage will be placed over the needle site. The bandage and needle will need to be changed every week, or as directed by your health care provider.   Keep the bandage covering the needle clean and dry. Do not get it wet. Follow your health care provider's instructions on how to take a shower or bath while the port is accessed.   If your port does not  need to stay accessed, no bandage is needed over the port.  WHAT IS FLUSHING? Flushing helps keep the port from getting clogged. Follow your health care provider's instructions on how and when to flush the port. Ports are usually flushed with saline solution or a medicine called heparin. The need for flushing will depend on how the port is used.   If the port is used for intermittent medicines or blood draws, the port will need to be flushed:   After medicines have been given.   After blood has been drawn.   As part of routine maintenance.   If a constant infusion is running, the port may not need to be flushed.  HOW LONG WILL MY PORT STAY IMPLANTED? The port can stay in for as long as your health care provider thinks it is needed. When it is time for the port to come out, surgery will be done to remove it. The procedure is similar to the one performed when the port was put in.  WHEN SHOULD I SEEK IMMEDIATE MEDICAL CARE? When you have an implanted port, you should seek immediate medical care if:   You notice a bad smell coming from the incision site.   You have swelling, redness, or drainage at the incision site.   You have more swelling or pain at the port site or the surrounding area.   You have a fever that is not controlled with medicine.   This information is not intended to replace advice given to you by your health care provider. Make sure you discuss any questions you have with your health care  provider.   Document Released: 07/11/2005 Document Revised: 05/01/2013 Document Reviewed: 03/18/2013 Elsevier Interactive Patient Education [employer].

## 2015-07-06 ENCOUNTER — Ambulatory Visit
Admission: RE | Admit: 2015-07-06 | Discharge: 2015-07-06 | Disposition: A | Discharge status: Home / Self Care | Payer: Managed Care, Other (non HMO) | Source: Ambulatory Visit | Attending: Radiation Oncology | Admitting: Radiation Oncology

## 2015-07-06 ENCOUNTER — Ambulatory Visit: Payer: Managed Care, Other (non HMO) | Admitting: Nutrition

## 2015-07-06 ENCOUNTER — Other Ambulatory Visit (HOSPITAL_BASED_OUTPATIENT_CLINIC_OR_DEPARTMENT_OTHER): Payer: Managed Care, Other (non HMO)

## 2015-07-06 ENCOUNTER — Encounter: Payer: Self-pay | Admitting: *Deleted

## 2015-07-06 ENCOUNTER — Ambulatory Visit (HOSPITAL_BASED_OUTPATIENT_CLINIC_OR_DEPARTMENT_OTHER): Payer: Managed Care, Other (non HMO)

## 2015-07-06 VITALS — BP 117/82 | HR 96 | Temp 98.0°F | Resp 17

## 2015-07-06 DIAGNOSIS — C3411 Malignant neoplasm of upper lobe, right bronchus or lung: Secondary | ICD-10-CM | POA: Diagnosis not present

## 2015-07-06 DIAGNOSIS — Z510 Encounter for antineoplastic radiation therapy: Secondary | ICD-10-CM | POA: Diagnosis not present

## 2015-07-06 DIAGNOSIS — C3491 Malignant neoplasm of unspecified part of right bronchus or lung: Secondary | ICD-10-CM

## 2015-07-06 DIAGNOSIS — Z5111 Encounter for antineoplastic chemotherapy: Secondary | ICD-10-CM

## 2015-07-06 DIAGNOSIS — R918 Other nonspecific abnormal finding of lung field: Secondary | ICD-10-CM

## 2015-07-06 LAB — COMPREHENSIVE METABOLIC PANEL
ALBUMIN: 3.7 g/dL (ref 3.5–5.0)
ALK PHOS: 40 U/L (ref 40–150)
ALT: 22 U/L (ref 0–55)
AST: 23 U/L (ref 5–34)
Anion Gap: 6 mEq/L (ref 3–11)
BUN: 12.5 mg/dL (ref 7.0–26.0)
CALCIUM: 9 mg/dL (ref 8.4–10.4)
CO2: 26 mEq/L (ref 22–29)
CREATININE: 0.7 mg/dL (ref 0.6–1.1)
Chloride: 103 mEq/L (ref 98–109)
EGFR: >90 mL/min/{1.73_m2} (ref >90)
Glucose: 84 mg/dl (ref 70–140)
Potassium: 4.4 mEq/L (ref 3.5–5.1)
Sodium: 136 mEq/L (ref 136–145)
Total Bilirubin: <0.3 mg/dL (ref 0.20–1.20)
Total Protein: 6.3 g/dL — ABNORMAL LOW (ref 6.4–8.3)

## 2015-07-06 LAB — CBC WITH DIFFERENTIAL/PLATELET
BASO%: 1.1 % (ref 0.0–2.0)
BASOS ABS: 0 10*3/uL (ref 0.0–0.1)
EOS%: 3.7 % (ref 0.0–7.0)
Eosinophils Absolute: 0.1 10*3/uL (ref 0.0–0.5)
HCT: 38.4 % (ref 34.8–46.6)
HEMOGLOBIN: 12.5 g/dL (ref 11.6–15.9)
LYMPH#: 0.4 10*3/uL — AB (ref 0.9–3.3)
LYMPH%: 13.6 % — ABNORMAL LOW (ref 14.0–49.7)
MCH: 31.2 pg (ref 25.1–34.0)
MCHC: 32.5 g/dL (ref 31.5–36.0)
MCV: 96.1 fL (ref 79.5–101.0)
MONO#: 0.6 10*3/uL (ref 0.1–0.9)
MONO%: 18.7 % — ABNORMAL HIGH (ref 0.0–14.0)
NEUT#: 1.9 10*3/uL (ref 1.5–6.5)
NEUT%: 62.9 % (ref 38.4–76.8)
Platelets: 174 10*3/uL (ref 145–400)
RBC: 4 10*6/uL (ref 3.70–5.45)
RDW: 14.7 % — AB (ref 11.2–14.5)
WBC: 3.1 10*3/uL — ABNORMAL LOW (ref 3.9–10.3)

## 2015-07-06 MED ORDER — FAMOTIDINE IN NACL 20-0.9 MG/50ML-% IV SOLN
20.0000 mg | Freq: Once | INTRAVENOUS | Status: AC
  Administered 2015-07-06: 20 mg via INTRAVENOUS

## 2015-07-06 MED ORDER — FAMOTIDINE IN NACL 20-0.9 MG/50ML-% IV SOLN
INTRAVENOUS | Status: AC
  Filled 2015-07-06: qty 50

## 2015-07-06 MED ORDER — PACLITAXEL CHEMO INJECTION 300 MG/50ML
45.0000 mg/m2 | Freq: Once | INTRAVENOUS | Status: AC
  Administered 2015-07-06: 78 mg via INTRAVENOUS
  Filled 2015-07-06: qty 13

## 2015-07-06 MED ORDER — HEPARIN SOD (PORK) LOCK FLUSH 100 UNIT/ML IV SOLN
500.0000 [IU] | Freq: Once | INTRAVENOUS | Status: AC | PRN
  Administered 2015-07-06: 500 [IU]
  Filled 2015-07-06: qty 5

## 2015-07-06 MED ORDER — SODIUM CHLORIDE 0.9 % IV SOLN
Freq: Once | INTRAVENOUS | Status: AC
  Administered 2015-07-06: 13:00:00 via INTRAVENOUS
  Filled 2015-07-06: qty 8

## 2015-07-06 MED ORDER — SODIUM CHLORIDE 0.9 % IV SOLN
Freq: Once | INTRAVENOUS | Status: AC
  Administered 2015-07-06: 13:00:00 via INTRAVENOUS

## 2015-07-06 MED ORDER — DIPHENHYDRAMINE HCL 50 MG/ML IJ SOLN
INTRAMUSCULAR | Status: AC
  Filled 2015-07-06: qty 1

## 2015-07-06 MED ORDER — SODIUM CHLORIDE 0.9 % IV SOLN
204.4000 mg | Freq: Once | INTRAVENOUS | Status: AC
  Administered 2015-07-06: 200 mg via INTRAVENOUS
  Filled 2015-07-06: qty 20

## 2015-07-06 MED ORDER — SODIUM CHLORIDE 0.9 % IJ SOLN
10.0000 mL | INTRAMUSCULAR | Status: DC | PRN
  Administered 2015-07-06: 10 mL
  Filled 2015-07-06: qty 10

## 2015-07-06 MED ORDER — DIPHENHYDRAMINE HCL 50 MG/ML IJ SOLN
25.0000 mg | Freq: Once | INTRAMUSCULAR | Status: AC
  Administered 2015-07-06: 25 mg via INTRAVENOUS

## 2015-07-06 NOTE — Progress Notes (Signed)
Oncology Nurse Navigator Documentation  Oncology Nurse Navigator Flowsheets 07/06/2015  Navigator Encounter Type Treatment/I spoke with Rose Figueroa today during her chemo.  She is doing well.  She does have some radiation burns and she spoke about that.  She stated she is doing ok with it and everything.   Patient Visit Type Medonc  Treatment Phase Treatment  Barriers/Navigation Needs No barriers at this time  Time Spent with Patient 15

## 2015-07-06 NOTE — Patient Instructions (Signed)
[facility] Discharge Instructions for Patients Receiving Chemotherapy  Today you received the following chemotherapy agents Taxol and Carboplatin. To help prevent nausea and vomiting after your treatment, we encourage you to take your nausea medication as directed.  If you develop nausea and vomiting that is not controlled by your nausea medication, call the clinic.   BELOW ARE SYMPTOMS THAT SHOULD BE REPORTED IMMEDIATELY:  *FEVER GREATER THAN 100.5 F  *CHILLS WITH OR WITHOUT FEVER  NAUSEA AND VOMITING THAT IS NOT CONTROLLED WITH YOUR NAUSEA MEDICATION  *UNUSUAL SHORTNESS OF BREATH  *UNUSUAL BRUISING OR BLEEDING  TENDERNESS IN MOUTH AND THROAT WITH OR WITHOUT PRESENCE OF ULCERS  *URINARY PROBLEMS  *BOWEL PROBLEMS  UNUSUAL RASH Items with * indicate a potential emergency and should be followed up as soon as possible.  Feel free to call the clinic you have any questions or concerns. The clinic phone number is [phone].  Please show the CHEMO ALERT CARD at check-in to the Emergency Department and triage nurse.    

## 2015-07-06 NOTE — Progress Notes (Signed)
Nutrition follow-up completed with patient receiving chemotherapy for lung cancer. Patient reports appetite has improved and she continues to eat well. She is juicing twice daily. She continues to drink lots of water and other liquids. Weight improved and documented as 144.5 pounds, December 8 improved from 141.9 pounds October 27. Patient denies nutrition impact symptoms.  Nutrition diagnosis: Food and nutrition related knowledge deficit resolved.  Patient educated to continue a healthy plant-based diet with adequate protein and calories for maintenance of lean body mass. Teach back method used.  Questions were answered. Patient encouraged to contact me with further questions or concerns.  **Disclaimer: This note was dictated with voice recognition software. Similar sounding words can inadvertently be transcribed and this note may contain transcription errors which may not have been corrected upon publication of note.**

## 2015-07-07 ENCOUNTER — Ambulatory Visit
Admission: RE | Admit: 2015-07-07 | Discharge: 2015-07-07 | Disposition: A | Discharge status: Home / Self Care | Payer: Managed Care, Other (non HMO) | Source: Ambulatory Visit | Attending: Radiation Oncology | Admitting: Radiation Oncology

## 2015-07-07 DIAGNOSIS — Z510 Encounter for antineoplastic radiation therapy: Secondary | ICD-10-CM | POA: Diagnosis not present

## 2015-07-08 ENCOUNTER — Ambulatory Visit
Admission: RE | Admit: 2015-07-08 | Discharge: 2015-07-08 | Disposition: A | Discharge status: Home / Self Care | Payer: Managed Care, Other (non HMO) | Source: Ambulatory Visit | Attending: Radiation Oncology | Admitting: Radiation Oncology

## 2015-07-08 DIAGNOSIS — Z510 Encounter for antineoplastic radiation therapy: Secondary | ICD-10-CM | POA: Diagnosis not present

## 2015-07-09 ENCOUNTER — Ambulatory Visit
Admission: RE | Admit: 2015-07-09 | Discharge: 2015-07-09 | Disposition: A | Discharge status: Home / Self Care | Payer: Managed Care, Other (non HMO) | Source: Ambulatory Visit | Attending: Radiation Oncology | Admitting: Radiation Oncology

## 2015-07-09 DIAGNOSIS — Z510 Encounter for antineoplastic radiation therapy: Secondary | ICD-10-CM | POA: Diagnosis not present

## 2015-07-10 ENCOUNTER — Encounter: Payer: Self-pay | Admitting: Radiation Oncology

## 2015-07-10 ENCOUNTER — Ambulatory Visit
Admission: RE | Admit: 2015-07-10 | Discharge: 2015-07-10 | Disposition: A | Discharge status: Home / Self Care | Payer: Managed Care, Other (non HMO) | Source: Ambulatory Visit | Attending: Radiation Oncology | Admitting: Radiation Oncology

## 2015-07-10 VITALS — BP 143/97 | HR 113 | Resp 18 | Wt 140.7 lb

## 2015-07-10 DIAGNOSIS — Z510 Encounter for antineoplastic radiation therapy: Secondary | ICD-10-CM | POA: Diagnosis not present

## 2015-07-10 DIAGNOSIS — C3411 Malignant neoplasm of upper lobe, right bronchus or lung: Secondary | ICD-10-CM

## 2015-07-10 NOTE — Progress Notes (Addendum)
Weight and vital stable. Reports aching pain center of her chest. Reports pain associated with swallowing has resolved but, she continues to use Carafate. Reports over the weekend she began having a running nose and sore throat. Swelling of right neck nodes and red irritated throat noted. Reports dry mouth and eyes. Hyperpigmentation with dry desquamation of upper chest and back noted. Reports using neosporin, hydrocortisone, and sonafine within treatment field. Understands to continue the use of sonafine bid for the next two weeks. One month follow up appointment card given.   BP 143/97 mmHg  Pulse 113  Resp 18  Wt 140 lb 11.2 oz (63.821 kg)  SpO2 99% Wt Readings from Last 3 Encounters:  07/10/15 140 lb 11.2 oz (63.821 kg)  07/02/15 144 lb 8 oz (65.545 kg)  06/29/15 141 lb 6.4 oz (64.139 kg)

## 2015-07-10 NOTE — Progress Notes (Signed)
  Radiation Oncology         [phone]   Name: Rose Figueroa MRN: [id number]   Date: 07/10/2015  DOB: 1956/04/26     Weekly Radiation Therapy Management    ICD-9-CM ICD-10-CM   1. Cancer of upper lobe of right lung (HCC) 162.3 C34.11     Current Dose: 54 Planned Dose:  66 Gy  Narrative The patient presents for routine under treatment assessment.  Weight and vital stable. Reports aching pain center of her chest. Reports pain associated with swallowing has resolved but, she continues to use Carafate. Reports over the weekend she began having a running nose and sore throat. Swelling of right neck nodes and red irritated throat noted. Reports dry mouth and eyes. Hyperpigmentation with dry desquamation of upper chest and back noted. Reports using neosporin, hydrocortisone, and sonafine within treatment field. Understands to continue. One month follow up appointment card given.  The patient is without complaint. Set-up films were reviewed. The chart was checked.  Physical Findings  weight is 140 lb 11.2 oz (63.821 kg). Her blood pressure is 143/97 and her pulse is 113. Her respiration is 18 and oxygen saturation is 99%. . Weight essentially stable.  No significant changes.   Mild erythema on chest with dry desquamation, no moist desquamation.  Impression The patient is tolerating radiation.  Plan Continue treatment as planned. She will follow up with me in one month.      Sheral Apley Tammi Klippel, M.D.  This document serves as a record of services personally performed by Tyler Pita, MD. It was created on his behalf by Lendon Collar, a trained medical scribe. The creation of this record is based on the scribe's personal observations and the provider's statements to them. This document has been checked and approved by the attending provider.

## 2015-07-12 ENCOUNTER — Ambulatory Visit: Payer: Managed Care, Other (non HMO)

## 2015-07-13 ENCOUNTER — Ambulatory Visit: Payer: Managed Care, Other (non HMO)

## 2015-07-13 ENCOUNTER — Ambulatory Visit
Admission: RE | Admit: 2015-07-13 | Discharge: 2015-07-13 | Disposition: A | Discharge status: Home / Self Care | Payer: Managed Care, Other (non HMO) | Source: Ambulatory Visit | Attending: Radiation Oncology | Admitting: Radiation Oncology

## 2015-07-13 ENCOUNTER — Encounter: Payer: Self-pay | Admitting: Internal Medicine

## 2015-07-13 ENCOUNTER — Other Ambulatory Visit (HOSPITAL_BASED_OUTPATIENT_CLINIC_OR_DEPARTMENT_OTHER): Payer: Managed Care, Other (non HMO)

## 2015-07-13 ENCOUNTER — Ambulatory Visit (HOSPITAL_BASED_OUTPATIENT_CLINIC_OR_DEPARTMENT_OTHER): Payer: Managed Care, Other (non HMO)

## 2015-07-13 ENCOUNTER — Telehealth: Payer: Self-pay | Admitting: Internal Medicine

## 2015-07-13 ENCOUNTER — Ambulatory Visit (HOSPITAL_BASED_OUTPATIENT_CLINIC_OR_DEPARTMENT_OTHER): Payer: Managed Care, Other (non HMO) | Admitting: Internal Medicine

## 2015-07-13 VITALS — BP 141/87 | HR 98 | Temp 98.2°F | Resp 18 | Ht 63.0 in | Wt 142.2 lb

## 2015-07-13 DIAGNOSIS — C3491 Malignant neoplasm of unspecified part of right bronchus or lung: Secondary | ICD-10-CM

## 2015-07-13 DIAGNOSIS — R918 Other nonspecific abnormal finding of lung field: Secondary | ICD-10-CM

## 2015-07-13 DIAGNOSIS — K208 Other esophagitis: Secondary | ICD-10-CM

## 2015-07-13 DIAGNOSIS — Z5111 Encounter for antineoplastic chemotherapy: Secondary | ICD-10-CM | POA: Diagnosis not present

## 2015-07-13 DIAGNOSIS — Z510 Encounter for antineoplastic radiation therapy: Secondary | ICD-10-CM | POA: Diagnosis not present

## 2015-07-13 DIAGNOSIS — C3411 Malignant neoplasm of upper lobe, right bronchus or lung: Secondary | ICD-10-CM

## 2015-07-13 DIAGNOSIS — Z95828 Presence of other vascular implants and grafts: Secondary | ICD-10-CM

## 2015-07-13 LAB — CBC WITH DIFFERENTIAL/PLATELET
BASO%: 0.6 % (ref 0.0–2.0)
Basophils Absolute: 0 10*3/uL (ref 0.0–0.1)
EOS ABS: 0.1 10*3/uL (ref 0.0–0.5)
EOS%: 2.7 % (ref 0.0–7.0)
HCT: 32.3 % — ABNORMAL LOW (ref 34.8–46.6)
HGB: 10.8 g/dL — ABNORMAL LOW (ref 11.6–15.9)
LYMPH%: 16.5 % (ref 14.0–49.7)
MCH: 31.6 pg (ref 25.1–34.0)
MCHC: 33.4 g/dL (ref 31.5–36.0)
MCV: 94.4 fL (ref 79.5–101.0)
MONO#: 0.4 10*3/uL (ref 0.1–0.9)
MONO%: 11.1 % (ref 0.0–14.0)
NEUT#: 2.3 10*3/uL (ref 1.5–6.5)
NEUT%: 69.1 % (ref 38.4–76.8)
PLATELETS: 167 10*3/uL (ref 145–400)
RBC: 3.42 10*6/uL — AB (ref 3.70–5.45)
RDW: 14.6 % — ABNORMAL HIGH (ref 11.2–14.5)
WBC: 3.3 10*3/uL — ABNORMAL LOW (ref 3.9–10.3)
lymph#: 0.6 10*3/uL — ABNORMAL LOW (ref 0.9–3.3)

## 2015-07-13 LAB — COMPREHENSIVE METABOLIC PANEL
ALT: 20 U/L (ref 0–55)
ANION GAP: 4 meq/L (ref 3–11)
AST: 20 U/L (ref 5–34)
Albumin: 3.4 g/dL — ABNORMAL LOW (ref 3.5–5.0)
Alkaline Phosphatase: 37 U/L — ABNORMAL LOW (ref 40–150)
BUN: 16.2 mg/dL (ref 7.0–26.0)
CHLORIDE: 107 meq/L (ref 98–109)
CO2: 25 meq/L (ref 22–29)
CREATININE: 0.7 mg/dL (ref 0.6–1.1)
Calcium: 8.6 mg/dL (ref 8.4–10.4)
Glucose: 89 mg/dl (ref 70–140)
Potassium: 4.3 mEq/L (ref 3.5–5.1)
Sodium: 137 mEq/L (ref 136–145)
Total Bilirubin: <0.3 mg/dL (ref 0.20–1.20)
Total Protein: 5.9 g/dL — ABNORMAL LOW (ref 6.4–8.3)

## 2015-07-13 MED ORDER — AZITHROMYCIN 250 MG PO TABS: ORAL_TABLET | ORAL | Status: DC

## 2015-07-13 MED ORDER — FAMOTIDINE IN NACL 20-0.9 MG/50ML-% IV SOLN
INTRAVENOUS | Status: AC
  Filled 2015-07-13: qty 50

## 2015-07-13 MED ORDER — PACLITAXEL CHEMO INJECTION 300 MG/50ML
45.0000 mg/m2 | Freq: Once | INTRAVENOUS | Status: AC
  Administered 2015-07-13: 78 mg via INTRAVENOUS
  Filled 2015-07-13: qty 13

## 2015-07-13 MED ORDER — SODIUM CHLORIDE 0.9 % IJ SOLN
10.0000 mL | INTRAMUSCULAR | Status: DC | PRN
  Administered 2015-07-13: 10 mL via INTRAVENOUS
  Filled 2015-07-13: qty 10

## 2015-07-13 MED ORDER — DIPHENHYDRAMINE HCL 50 MG/ML IJ SOLN
25.0000 mg | Freq: Once | INTRAMUSCULAR | Status: AC
  Administered 2015-07-13: 25 mg via INTRAVENOUS

## 2015-07-13 MED ORDER — SODIUM CHLORIDE 0.9 % IJ SOLN
10.0000 mL | INTRAMUSCULAR | Status: DC | PRN
  Administered 2015-07-13: 10 mL
  Filled 2015-07-13: qty 10

## 2015-07-13 MED ORDER — HEPARIN SOD (PORK) LOCK FLUSH 100 UNIT/ML IV SOLN
500.0000 [IU] | Freq: Once | INTRAVENOUS | Status: AC | PRN
  Administered 2015-07-13: 500 [IU]
  Filled 2015-07-13: qty 5

## 2015-07-13 MED ORDER — SODIUM CHLORIDE 0.9 % IV SOLN
Freq: Once | INTRAVENOUS | Status: AC
  Administered 2015-07-13: 13:00:00 via INTRAVENOUS
  Filled 2015-07-13: qty 8

## 2015-07-13 MED ORDER — DIPHENHYDRAMINE HCL 50 MG/ML IJ SOLN
INTRAMUSCULAR | Status: AC
  Filled 2015-07-13: qty 1

## 2015-07-13 MED ORDER — SODIUM CHLORIDE 0.9 % IV SOLN
204.4000 mg | Freq: Once | INTRAVENOUS | Status: AC
  Administered 2015-07-13: 200 mg via INTRAVENOUS
  Filled 2015-07-13: qty 20

## 2015-07-13 MED ORDER — FAMOTIDINE IN NACL 20-0.9 MG/50ML-% IV SOLN
20.0000 mg | Freq: Once | INTRAVENOUS | Status: AC
  Administered 2015-07-13: 20 mg via INTRAVENOUS

## 2015-07-13 MED ORDER — SODIUM CHLORIDE 0.9 % IV SOLN
Freq: Once | INTRAVENOUS | Status: AC
  Administered 2015-07-13: 13:00:00 via INTRAVENOUS

## 2015-07-13 NOTE — Telephone Encounter (Signed)
per pof to sch pt appt-gave pt copy of avs-adv [facility] will call to sch trmt °

## 2015-07-13 NOTE — Patient Instructions (Signed)

## 2015-07-13 NOTE — Progress Notes (Signed)
[facility] Telephone:(336) [phone]   Fax:(336) [phone]  OFFICE PROGRESS NOTE  Figueroa, Rose G, MD [address] [place] [address]  DIAGNOSIS: Stage IIIB (T2a, N3, M0) non-small cell lung cancer, adenocarcinoma with negative EGFR mutation and negative ALK gene translocation diagnosed in October 2016.  PRIOR THERAPY: None  CURRENT THERAPY: Concurrent chemoradiation with weekly carboplatin for AUC of 2 and paclitaxel 45 MG/M2 status post 6 cycles.  INTERVAL HISTORY: Rose Figueroa 59 y.o. female returns to the clinic today for follow-up visit accompanied by her husband. The patient is tolerating her current course of concurrent chemoradiation fairly well except for mild radiation induced esophagitis. She is currently on Carafate. She also had flulike symptoms a started last week with some enlarging right cervical lymph nodes. She denied having any significant weight loss or night sweats. She denied having any chest pain, shortness of breath, cough or hemoptysis. She has no significant nausea or vomiting, no fever or chills. She had a Port-A-Cath placed last week. She is here today to start cycle #7 of her concurrent chemoradiation.  MEDICAL HISTORY: Past Medical History  Diagnosis Date  . Osteoporosis   . H/O cold sores   . Diverticulosis   . Rectal bleeding   . BCC (basal cell carcinoma of skin)   . right upper lobe lung mass 04/15/2015  . Back pain   . Colon polyps   . Complication of anesthesia     pt. states that she is difficult to arouse  . PONV (postoperative nausea and vomiting)   . Dysrhythmia   . Anxiety   . Complication of anesthesia   . History of anemia     ALLERGIES:  has No Known Allergies.  MEDICATIONS:  Current Outpatient Prescriptions  Medication Sig Dispense Refill  . Biotin 5000 MCG CAPS Take 5,000 mcg by mouth daily.    . Calcium Carb-Cholecalciferol (CALCIUM 1000 + D PO) Take 6 tablets by mouth daily. D3 = 200 IU per  each.    . L-LYSINE PO Take 4,000 mg by mouth daily.     . Melatonin 3 MG CAPS Take 3 mg by mouth once.    . Misc Natural Products (TART CHERRY ADVANCED PO) Take 1,200 mg by mouth daily.    . Multiple Vitamins-Minerals (CENTRUM SILVER PO) Take 1 tablet by mouth daily.    . Omega-3 Fatty Acids (RA FISH OIL) 1400 MG CPDR Take 2 capsules by mouth daily.     . prochlorperazine (COMPAZINE) 10 MG tablet Take 1 tablet (10 mg total) by mouth every 6 (six) hours as needed for nausea or vomiting. 30 tablet 0  . Spirulina 500 MG TABS Take by mouth daily.    . sucralfate (CARAFATE) 1 GM/10ML suspension Take 10 mLs (1 g total) by mouth 4 (four) times daily -  with meals and at bedtime. 420 mL 0  . Turmeric Curcumin 500 MG CAPS Take 2 each by mouth daily.     . vitamin B-12 (CYANOCOBALAMIN) 1000 MCG tablet Take 1,000 mcg by mouth daily.    . Wound Dressings (SONAFINE) Apply 1 application topically 3 (three) times daily.    Marland Kitchen lidocaine-prilocaine (EMLA) cream Apply 1 application topically as needed. (Patient not taking: Reported on 06/23/2015) 30 g 0   No current facility-administered medications for this visit.   Facility-Administered Medications Ordered in Other Visits  Medication Dose Route Frequency Provider Last Rate Last Dose  . sodium chloride 0.9 % injection 10  mL  10 mL Intravenous PRN Curt Bears, MD   10 mL at 07/13/15 1146    SURGICAL HISTORY:  Past Surgical History  Procedure Laterality Date  . Cystectomy      right breast x 2  . Colonoscopy    . Colonoscopy w/ polypectomy    . Video bronchoscopy with endobronchial ultrasound N/A 05/07/2015    Procedure: VIDEO BRONCHOSCOPY WITH ENDOBRONCHIAL ULTRASOUND;  Surgeon: Ivin Poot, MD;  Location: [facility];  Service: Thoracic;  Laterality: N/A;  . Mediastinoscopy N/A 05/07/2015    Procedure: MEDIASTINOSCOPY;  Surgeon: Ivin Poot, MD;  Location: [facility];  Service: Thoracic;  Laterality: N/A;    REVIEW OF SYSTEMS:  A comprehensive  review of systems was negative except for: Ears, nose, mouth, throat, and face: positive for nasal congestion Gastrointestinal: positive for odynophagia   PHYSICAL EXAMINATION: General appearance: alert, cooperative and no distress Head: Normocephalic, without obvious abnormality, atraumatic Neck: no adenopathy, no JVD, supple, symmetrical, trachea midline and thyroid not enlarged, symmetric, no tenderness/mass/nodules Lymph nodes: Cervical, supraclavicular, and axillary nodes normal. Resp: clear to auscultation bilaterally Back: symmetric, no curvature. ROM normal. No CVA tenderness. Cardio: regular rate and rhythm, S1, S2 normal, no murmur, click, rub or gallop GI: soft, non-tender; bowel sounds normal; no masses,  no organomegaly Extremities: extremities normal, atraumatic, no cyanosis or edema  ECOG PERFORMANCE STATUS: 1 - Symptomatic but completely ambulatory  Blood pressure 141/87, pulse 98, temperature 98.2 F (36.8 C), temperature source Oral, resp. rate 18, height 5' 3"  (1.6 m), weight 142 lb 3.2 oz (64.501 kg), SpO2 100 %.  LABORATORY DATA: Lab Results  Component Value Date   WBC 3.3* 07/13/2015   HGB 10.8* 07/13/2015   HCT 32.3* 07/13/2015   MCV 94.4 07/13/2015   PLT 167 07/13/2015      Chemistry      Component Value Date/Time   NA 136 07/06/2015 1125   NA 139 07/03/2015 1200   K 4.4 07/06/2015 1125   K 4.3 07/03/2015 1200   CL 107 07/03/2015 1200   CO2 26 07/06/2015 1125   CO2 26 07/03/2015 1200   BUN 12.5 07/06/2015 1125   BUN 15 07/03/2015 1200   CREATININE 0.7 07/06/2015 1125   CREATININE 0.56 07/03/2015 1200      Component Value Date/Time   CALCIUM 9.0 07/06/2015 1125   CALCIUM 9.3 07/03/2015 1200   ALKPHOS 40 07/06/2015 1125   ALKPHOS 35* 04/30/2015 1336   AST 23 07/06/2015 1125   AST 27 04/30/2015 1336   ALT 22 07/06/2015 1125   ALT 24 04/30/2015 1336   BILITOT <0.30 07/06/2015 1125   BILITOT 0.6 04/30/2015 1336       RADIOGRAPHIC  STUDIES: Ir Fluoro Guide Cv Line Left  07/03/2015  CLINICAL DATA:  59 year old female with a history of non-small cell carcinoma. She presents for port catheter placement secondary to poor IV access. The patient has erythema of the right anterior chest wall in the portal for radiation therapy treatment. A left sided poor will be placed. EXAM: IR LEFT FLOURO GUIDE CV LINE; IR ULTRASOUND GUIDANCE VASC ACCESS LEFT Date: 07/03/2015 ANESTHESIA/SEDATION: Moderate (conscious) sedation was administered during this procedure. A total of 4.0 mg Versed and 50 mg Fentanyl were administered intravenously. The patient's vital signs were monitored continuously by radiology nursing throughout the course of the procedure. Total sedation time: 20 minutes FLUOROSCOPY TIME:  12 seconds TECHNIQUE: The left neck and chest was prepped with chlorhexidine, and draped in the usual sterile  fashion using maximum barrier technique (cap and mask, sterile gown, sterile gloves, large sterile sheet, hand hygiene and cutaneous antiseptic). Antibiotic prophylaxis was provided with 2.0g Ancef administered IV one hour prior to skin incision. Local anesthesia was attained by infiltration with 1% lidocaine without at the. Ultrasound demonstrated patency of the left internal jugular vein, and this was documented with an image. Under real-time ultrasound guidance, this vein was accessed with a 21 gauge micropuncture needle and image documentation was performed. A small dermatotomy was made at the access site with an 11 scalpel. A 0.018" wire was advanced into the SVC and the access needle exchanged for a 19F micropuncture vascular sheath. The 0.018" wire was then removed and a 0.035" wire advanced into the IVC. An appropriate location for the subcutaneous reservoir was selected below the clavicle and an incision was made through the skin and underlying soft tissues. The subcutaneous tissues were then dissected using a combination of blunt and sharp  surgical technique and a pocket was formed. A single lumen power injectable portacatheter was then tunneled through the subcutaneous tissues from the pocket to the dermatotomy and the port reservoir placed within the subcutaneous pocket. The venous access site was then serially dilated and a peel away vascular sheath placed over the wire. The wire was removed and the port catheter advanced into position under fluoroscopic guidance. The catheter tip is positioned in the upper right atrium. This was documented with a spot image. The portacatheter was then tested and found to flush and aspirate well. The port was flushed with saline followed by 100 units/mL heparinized saline. The pocket was then closed in two layers using first subdermal inverted interrupted absorbable sutures followed by a running subcuticular suture. The epidermis was then sealed with Dermabond. The dermatotomy at the venous access site was also sealed with Dermabond. COMPLICATIONS: None IMPRESSION: Status post left IJ port catheter placement. Catheter ready for use. Signed, Dulcy Fanny. Earleen Newport, DO [facility] Electronically Signed   By: Corrie Mckusick D.O.   On: 07/03/2015 15:54   Ir US Guide Vasc Access Left  07/03/2015  CLINICAL DATA:  59 year old female with a history of non-small cell carcinoma. She presents for port catheter placement secondary to poor IV access. The patient has erythema of the right anterior chest wall in the portal for radiation therapy treatment. A left sided poor will be placed. EXAM: IR LEFT FLOURO GUIDE CV LINE; IR ULTRASOUND GUIDANCE VASC ACCESS LEFT Date: 07/03/2015 ANESTHESIA/SEDATION: Moderate (conscious) sedation was administered during this procedure. A total of 4.0 mg Versed and 50 mg Fentanyl were administered intravenously. The patient's vital signs were monitored continuously by radiology nursing throughout the course of the procedure. Total sedation time:  20 minutes FLUOROSCOPY TIME:  12 seconds TECHNIQUE: The left neck and chest was prepped with chlorhexidine, and draped in the usual sterile fashion using maximum barrier technique (cap and mask, sterile gown, sterile gloves, large sterile sheet, hand hygiene and cutaneous antiseptic). Antibiotic prophylaxis was provided with 2.0g Ancef administered IV one hour prior to skin incision. Local anesthesia was attained by infiltration with 1% lidocaine without at the. Ultrasound demonstrated patency of the left internal jugular vein, and this was documented with an image. Under real-time ultrasound guidance, this vein was accessed with a 21 gauge micropuncture needle and image documentation was performed. A small dermatotomy was made at the access site with an 11 scalpel. A 0.018" wire was advanced into the SVC and the access needle exchanged  for a 12F micropuncture vascular sheath. The 0.018" wire was then removed and a 0.035" wire advanced into the IVC. An appropriate location for the subcutaneous reservoir was selected below the clavicle and an incision was made through the skin and underlying soft tissues. The subcutaneous tissues were then dissected using a combination of blunt and sharp surgical technique and a pocket was formed. A single lumen power injectable portacatheter was then tunneled through the subcutaneous tissues from the pocket to the dermatotomy and the port reservoir placed within the subcutaneous pocket. The venous access site was then serially dilated and a peel away vascular sheath placed over the wire. The wire was removed and the port catheter advanced into position under fluoroscopic guidance. The catheter tip is positioned in the upper right atrium. This was documented with a spot image. The portacatheter was then tested and found to flush and aspirate well. The port was flushed with saline followed by 100 units/mL heparinized saline. The pocket was then closed in two layers using first  subdermal inverted interrupted absorbable sutures followed by a running subcuticular suture. The epidermis was then sealed with Dermabond. The dermatotomy at the venous access site was also sealed with Dermabond. COMPLICATIONS: None IMPRESSION: Status post left IJ port catheter placement. Catheter ready for use. Signed, Dulcy Fanny. Earleen Newport, DO [facility] Electronically Signed   By: Corrie Mckusick D.O.   On: 07/03/2015 15:54    ASSESSMENT AND PLAN: This is a very pleasant 59 years old white female with stage IIIB non-small cell lung cancer currently undergoing a course of concurrent chemoradiation with weekly carboplatin and paclitaxel is status post 6 cycles and tolerating her treatment fairly well. I recommended for the patient to proceed with cycle #7 today as a scheduled. For the radiation induced esophagitis, the patient will continue on Carafate. Physical symptoms and bronchitis, I will start the patient on Z-Pak. The patient would come back for follow-up visit in 6 weeks for reevaluation after repeating CT scan of the chest for restaging of her disease. She was advised to call immediately if she has any concerning symptoms in the interval. The patient voices understanding of current disease status and treatment options and is in agreement with the current care plan.  All questions were answered. The patient knows to call the clinic with any problems, questions or concerns. We can certainly see the patient much sooner if necessary.  Disclaimer: This note was dictated with voice recognition software. Similar sounding words can inadvertently be transcribed and may not be corrected upon review.

## 2015-07-14 ENCOUNTER — Ambulatory Visit: Payer: Managed Care, Other (non HMO)

## 2015-07-14 ENCOUNTER — Ambulatory Visit
Admission: RE | Admit: 2015-07-14 | Discharge: 2015-07-14 | Disposition: A | Discharge status: Home / Self Care | Payer: Managed Care, Other (non HMO) | Source: Ambulatory Visit | Attending: Radiation Oncology | Admitting: Radiation Oncology

## 2015-07-14 DIAGNOSIS — Z510 Encounter for antineoplastic radiation therapy: Secondary | ICD-10-CM | POA: Diagnosis not present

## 2015-07-15 ENCOUNTER — Ambulatory Visit: Payer: Managed Care, Other (non HMO)

## 2015-07-15 ENCOUNTER — Ambulatory Visit
Admission: RE | Admit: 2015-07-15 | Discharge: 2015-07-15 | Disposition: A | Discharge status: Home / Self Care | Payer: Managed Care, Other (non HMO) | Source: Ambulatory Visit | Attending: Radiation Oncology | Admitting: Radiation Oncology

## 2015-07-15 DIAGNOSIS — Z510 Encounter for antineoplastic radiation therapy: Secondary | ICD-10-CM | POA: Diagnosis not present

## 2015-07-16 ENCOUNTER — Ambulatory Visit: Payer: Managed Care, Other (non HMO)

## 2015-07-16 ENCOUNTER — Encounter: Payer: Self-pay | Admitting: Radiation Oncology

## 2015-07-16 ENCOUNTER — Ambulatory Visit
Admission: RE | Admit: 2015-07-16 | Discharge: 2015-07-16 | Disposition: A | Discharge status: Home / Self Care | Payer: Managed Care, Other (non HMO) | Source: Ambulatory Visit | Attending: Radiation Oncology | Admitting: Radiation Oncology

## 2015-07-16 DIAGNOSIS — Z510 Encounter for antineoplastic radiation therapy: Secondary | ICD-10-CM | POA: Diagnosis not present

## 2015-07-17 ENCOUNTER — Ambulatory Visit: Payer: Managed Care, Other (non HMO)

## 2015-07-21 ENCOUNTER — Ambulatory Visit: Payer: Managed Care, Other (non HMO)

## 2015-07-27 NOTE — Progress Notes (Signed)
  Radiation Oncology         [phone] ________________________________  Name: Rose Figueroa MRN: [id number]  Date: 07/16/2015  DOB: December 28, 1955   End of Treatment Note   ICD-9-CM ICD-10-CM    1. Primary lung cancer, right ([place]) 162.9 C34.91     DIAGNOSIS: 60 yo woman with T2aN3M0 Adenocarcinoma of the Right Upper Lung - Stage IIIB     Indication for treatment:  Curative, Chemo-Radiotherapy          Radiation treatment dates:   06/01/2015-07/16/2015  Site/dose:   The primary tumor and involved mediastinal adenopathy were treated to 66 Gy in 33 fractions of 2 Gy.  Beams/energy:   A five field 3D conformal treatment arrangement was used delivering 6 and 10 MV photons.  Daily image-guidance CT was used to align the treatment with the targeted volume  Narrative: The patient tolerated radiation treatment relatively well.  The patient experienced some esophagitis.  Despite this, weight and vitals were stable. Reported aching pain center of her chest. Reported pain associated with swallowing had resolved but, she continued to use Carafate. Reported over the weekend she began having a running nose and sore throat. Swelling of right neck nodes and red irritated throat noted. Reported dry mouth and eyes. Hyperpigmentation with dry desquamation of upper chest and back noted. Reported using neosporin, hydrocortisone, and sonafine within treatment field.  The patient also noted fatigue.  Plan: The patient has completed radiation treatment. The patient will return to radiation oncology clinic for routine followup in one month. I advised her to call or return sooner if she has any questions or concerns related to her recovery or treatment.  ________________________________  Sheral Apley. Tammi Klippel, M.D.

## 2015-08-20 ENCOUNTER — Ambulatory Visit
Admission: RE | Admit: 2015-08-20 | Discharge: 2015-08-20 | Disposition: A | Discharge status: Home / Self Care | Payer: Managed Care, Other (non HMO) | Source: Ambulatory Visit | Attending: Radiation Oncology | Admitting: Radiation Oncology

## 2015-08-20 ENCOUNTER — Encounter: Payer: Self-pay | Admitting: Internal Medicine

## 2015-08-20 VITALS — BP 127/84 | HR 99 | Resp 16 | Wt 143.1 lb

## 2015-08-20 DIAGNOSIS — C3411 Malignant neoplasm of upper lobe, right bronchus or lung: Secondary | ICD-10-CM

## 2015-08-20 NOTE — Progress Notes (Signed)
Weight and vitals stable. Denies pain. Reports increased indigestion. Reports esophagitis has resolved. Reports a heaviness in her chest continues. Reports intermittent brief pains in the center of her chest. Reports dry throat has resolved. Skin affected by radiation has healed with the aid of aquaphor and vitamin e. Reports her energy level is improving.   BP 127/84 mmHg  Pulse 99  Resp 16  Wt 143 lb 1.6 oz (64.91 kg)  SpO2 100% Wt Readings from Last 3 Encounters:  08/20/15 143 lb 1.6 oz (64.91 kg)  07/13/15 142 lb 3.2 oz (64.501 kg)  07/10/15 140 lb 11.2 oz (63.821 kg)

## 2015-08-20 NOTE — Progress Notes (Signed)
Radiation Oncology         [phone]) [phone] ________________________________  Name: Rose Figueroa MRN: [id number]  Date: 08/20/2015  DOB: [birth year]  Follow-Up Visit Note  CC: [name], [name], MD  [place] Gum, Collier Salina, MD  Diagnosis:  Ms. Sarno is a pleasant 60 y.o woman with T2aN3M0 adenocarcinoma of the Right Upper Lung - Stage IIIB, status post 66 grays in 33 fractions to the primary tumor and mediastinal adenopathy.   Interval Since Last Radiation:  1 month; 07/16/2015   Narrative:  The patient returns today for routine follow-up. Weight and vitals stable. Patient reports increased indigestion. Reports esophagitis has resolved. Reports that feeling of heaviness in her chest continues. Reports intermittent brief pains in the center of her chest. Reports dry throat has resolved. Denies throat pain. Skin affected by radiation has healed with the aid of aquaphor and vitamin e. Reports that her energy level is improving. She states that she is unsure about her indigestion but states that she has not tried taking any over-the-counter acid reducers. She feels that this may be related to eating more, and some of the things that she chooses. She continues to feel a sense of dryness in her mouth and throat, and states otherwise that she is able to eat well and denies any nausea or vomiting. She states that she can eat denser foods now without having a sense of this getting stuck in her throat when swallowing. Please review of systems is obtained and is otherwise negative.                              ALLERGIES:  has No Known Allergies.  Meds: Current Outpatient Prescriptions  Medication Sig Dispense Refill  . Biotin 5000 MCG CAPS Take 5,000 mcg by mouth daily.    . Calcium Carb-Cholecalciferol (CALCIUM 1000 + D PO) Take 6 tablets by mouth daily. D3 = 200 IU per each.    . L-LYSINE PO Take 4,000 mg by mouth daily.     . Melatonin 3 MG CAPS Take 3 mg by mouth once.    . Misc Natural Products (TART  CHERRY ADVANCED PO) Take 1,200 mg by mouth daily.    . Multiple Vitamins-Minerals (CENTRUM SILVER PO) Take 1 tablet by mouth daily.    . Omega-3 Fatty Acids (RA FISH OIL) 1400 MG CPDR Take 2 capsules by mouth daily.     . Turmeric Curcumin 500 MG CAPS Take 2 each by mouth daily.     . vitamin B-12 (CYANOCOBALAMIN) 1000 MCG tablet Take 1,000 mcg by mouth daily.    Marland Kitchen lidocaine-prilocaine (EMLA) cream Apply 1 application topically as needed. (Patient not taking: Reported on 06/23/2015) 30 g 0   No current facility-administered medications for this encounter.    Physical Findings:  weight is 143 lb 1.6 oz (64.91 kg). Her blood pressure is 127/84 and her pulse is 99. Her respiration is 16 and oxygen saturation is 100%.   In general this is a well-appearing Caucasian female in no acute distress. She is alert and oriented 4 and appropriate throughout the examination. Cardiovascular exam reveals a regular rate and rhythm no clicks rubs or murmurs auscultated. Chest is clear to auscultation bilaterally. No palpable adenopathy is noted of the supraclavicular cervical or axillary regions. Skin is intact without any breakdown or desquamation.  Lab Findings: Lab Results  Component Value Date   WBC 3.3* 07/13/2015   WBC 2.7* 07/03/2015   HGB  10.8* 07/13/2015   HGB 12.5 07/03/2015   HCT 32.3* 07/13/2015   HCT 37.4 07/03/2015   PLT 167 07/13/2015   PLT 212 07/03/2015    Lab Results  Component Value Date   NA 137 07/13/2015   NA 139 07/03/2015   K 4.3 07/13/2015   K 4.3 07/03/2015   CHLORIDE 107 07/13/2015   CO2 25 07/13/2015   CO2 26 07/03/2015   GLUCOSE 89 07/13/2015   GLUCOSE 94 07/03/2015   BUN 16.2 07/13/2015   BUN 15 07/03/2015   CREATININE 0.7 07/13/2015   CREATININE 0.56 07/03/2015   BILITOT <0.30 07/13/2015   BILITOT 0.6 04/30/2015   ALKPHOS 37* 07/13/2015   ALKPHOS 35* 04/30/2015   AST 20 07/13/2015   AST 27 04/30/2015   ALT 20 07/13/2015   ALT 24 04/30/2015   PROT 5.9*  07/13/2015   PROT 6.7 04/30/2015   ALBUMIN 3.4* 07/13/2015   ALBUMIN 4.3 04/30/2015   CALCIUM 8.6 07/13/2015   CALCIUM 9.3 07/03/2015   ANIONGAP 4 07/13/2015   ANIONGAP 6 07/03/2015    Radiographic Findings: No results found.  Impression:  The patient is recovering from the effects of radiation.   Plan:  CT scan and follow-up with Dr. Earlie Server are scheduled. We will be available to patient as needed.   The above documentation reflects my direct findings during this shared patient visit. Please see the separate note by Dr. Tammi Klippel on this date for the remainder of the patient's plan of care.  Carola Rhine, PAC   This document serves as a record of services personally performed by Shona Simpson PAC, and Tyler Pita, MD. It was created on their behalf by Jenell Milliner, a trained medical scribe. The creation of this record is based on the scribe's personal observations and the provider's statements to them. This document has been checked and approved by the attending provider.

## 2015-08-20 NOTE — Progress Notes (Signed)
Patient came in with a receipt of a copay she had paid here for $45. She states she spoke with her insurance company and they had already paid this amount and she should not have been charged because her amounts had been met. Patient wanted to know how the refund process works. I emailed Bobbi in billing and asked for patient to be called with this explanation.

## 2015-08-21 NOTE — Patient Instructions (Signed)
Contact our office if you have any questions following today's appointment: [phone].  

## 2015-08-28 ENCOUNTER — Ambulatory Visit (HOSPITAL_COMMUNITY)
Admission: RE | Admit: 2015-08-28 | Discharge: 2015-08-28 | Disposition: A | Discharge status: Home / Self Care | Payer: Managed Care, Other (non HMO) | Source: Ambulatory Visit | Attending: Internal Medicine | Admitting: Internal Medicine

## 2015-08-28 ENCOUNTER — Other Ambulatory Visit (HOSPITAL_BASED_OUTPATIENT_CLINIC_OR_DEPARTMENT_OTHER): Payer: Managed Care, Other (non HMO)

## 2015-08-28 ENCOUNTER — Encounter (HOSPITAL_COMMUNITY): Payer: Self-pay

## 2015-08-28 DIAGNOSIS — Z5111 Encounter for antineoplastic chemotherapy: Secondary | ICD-10-CM

## 2015-08-28 DIAGNOSIS — Z9221 Personal history of antineoplastic chemotherapy: Secondary | ICD-10-CM | POA: Diagnosis present

## 2015-08-28 DIAGNOSIS — C3411 Malignant neoplasm of upper lobe, right bronchus or lung: Secondary | ICD-10-CM

## 2015-08-28 DIAGNOSIS — R918 Other nonspecific abnormal finding of lung field: Secondary | ICD-10-CM | POA: Diagnosis not present

## 2015-08-28 DIAGNOSIS — R590 Localized enlarged lymph nodes: Secondary | ICD-10-CM | POA: Insufficient documentation

## 2015-08-28 LAB — COMPREHENSIVE METABOLIC PANEL
ALT: 12 U/L (ref 0–55)
AST: 16 U/L (ref 5–34)
Albumin: 3.5 g/dL (ref 3.5–5.0)
Alkaline Phosphatase: 54 U/L (ref 40–150)
Anion Gap: 9 mEq/L (ref 3–11)
BILIRUBIN TOTAL: 0.41 mg/dL (ref 0.20–1.20)
BUN: 15 mg/dL (ref 7.0–26.0)
CHLORIDE: 105 meq/L (ref 98–109)
CO2: 27 meq/L (ref 22–29)
CREATININE: 0.8 mg/dL (ref 0.6–1.1)
Calcium: 9.7 mg/dL (ref 8.4–10.4)
EGFR: 84 mL/min/{1.73_m2} — ABNORMAL LOW (ref >90)
GLUCOSE: 90 mg/dL (ref 70–140)
Potassium: 4.4 mEq/L (ref 3.5–5.1)
SODIUM: 141 meq/L (ref 136–145)
Total Protein: 6.7 g/dL (ref 6.4–8.3)

## 2015-08-28 LAB — CBC WITH DIFFERENTIAL/PLATELET
BASO%: 1.1 % (ref 0.0–2.0)
BASOS ABS: 0 10*3/uL (ref 0.0–0.1)
EOS ABS: 0.1 10*3/uL (ref 0.0–0.5)
EOS%: 2.5 % (ref 0.0–7.0)
HCT: 37.4 % (ref 34.8–46.6)
HGB: 12.6 g/dL (ref 11.6–15.9)
LYMPH%: 22.4 % (ref 14.0–49.7)
MCH: 32.6 pg (ref 25.1–34.0)
MCHC: 33.7 g/dL (ref 31.5–36.0)
MCV: 96.6 fL (ref 79.5–101.0)
MONO#: 0.4 10*3/uL (ref 0.1–0.9)
MONO%: 10.5 % (ref 0.0–14.0)
NEUT#: 2.2 10*3/uL (ref 1.5–6.5)
NEUT%: 63.5 % (ref 38.4–76.8)
Platelets: 280 10*3/uL (ref 145–400)
RBC: 3.87 10*6/uL (ref 3.70–5.45)
RDW: 13.6 % (ref 11.2–14.5)
WBC: 3.5 10*3/uL — AB (ref 3.9–10.3)
lymph#: 0.8 10*3/uL — ABNORMAL LOW (ref 0.9–3.3)

## 2015-08-28 MED ORDER — IOHEXOL 300 MG/ML  SOLN
75.0000 mL | Freq: Once | INTRAMUSCULAR | Status: AC | PRN
  Administered 2015-08-28: 75 mL via INTRAVENOUS

## 2015-09-01 ENCOUNTER — Encounter: Payer: Self-pay | Admitting: Internal Medicine

## 2015-09-01 ENCOUNTER — Other Ambulatory Visit: Payer: Self-pay

## 2015-09-01 ENCOUNTER — Ambulatory Visit (HOSPITAL_BASED_OUTPATIENT_CLINIC_OR_DEPARTMENT_OTHER): Payer: Managed Care, Other (non HMO) | Admitting: Internal Medicine

## 2015-09-01 ENCOUNTER — Encounter: Payer: Self-pay | Admitting: *Deleted

## 2015-09-01 VITALS — BP 137/90 | HR 98 | Temp 97.6°F | Resp 17 | Ht 63.0 in | Wt 144.0 lb

## 2015-09-01 DIAGNOSIS — J700 Acute pulmonary manifestations due to radiation: Secondary | ICD-10-CM

## 2015-09-01 DIAGNOSIS — Z1231 Encounter for screening mammogram for malignant neoplasm of breast: Secondary | ICD-10-CM

## 2015-09-01 DIAGNOSIS — Z5111 Encounter for antineoplastic chemotherapy: Secondary | ICD-10-CM

## 2015-09-01 DIAGNOSIS — C3411 Malignant neoplasm of upper lobe, right bronchus or lung: Secondary | ICD-10-CM | POA: Diagnosis not present

## 2015-09-01 DIAGNOSIS — Z85118 Personal history of other malignant neoplasm of bronchus and lung: Secondary | ICD-10-CM

## 2015-09-01 MED ORDER — METHYLPREDNISOLONE 4 MG PO TBPK: ORAL_TABLET | ORAL | Status: DC

## 2015-09-01 NOTE — Progress Notes (Signed)
Oncology Nurse Navigator Documentation  Oncology Nurse Navigator Flowsheets 09/01/2015  Navigator Location [facility]  Navigator Encounter Type Clinic/[facility]  Patient Visit Type Follow-up  Treatment Phase Post-Tx Follow-up/spoke with patient and her husband today.  She is post tx and will be on observation for a month.  Patient was emotional about "not doing anything for the cancer".  I explained the reason for observation and next steps.  She stated she understood but hard to just do nothing.  Dr. Julien Nordmann again spoke with patient about next steps.  I asked that she call me if needed.  I will send her a card today to keep her encouraged during this time.   Barriers/Navigation Needs Education/Support  Interventions Other  Acuity Level 2  Time Spent with Patient 30

## 2015-09-01 NOTE — Progress Notes (Signed)
[place] Telephone:(336) [phone]   Fax:(336) [phone]  OFFICE PROGRESS NOTE  [name], [name], MD [address] [place] [address]  DIAGNOSIS: Stage IIIB (T2a, N3, M0) non-small cell lung cancer, adenocarcinoma with negative EGFR mutation and negative ALK gene translocation diagnosed in October 2016.  PRIOR THERAPY: Concurrent chemoradiation with weekly carboplatin for AUC of 2 and paclitaxel 45 MG/M2 status post 6 cycles. Last dose was given 07/13/2015 with partial response.  CURRENT THERAPY: Observation.  INTERVAL HISTORY: Rose Figueroa 60 y.o. female returns to the clinic today for follow-up visit accompanied by her husband. The patient is tolerating her current course of concurrent chemoradiation fairly well except for mild radiation induced esophagitis. She is recovering well. She denied having any significant weight loss or night sweats. She denied having any chest pain, shortness of breath, cough or hemoptysis. She has no significant nausea or vomiting, no fever or chills. She had repeat CT scan of the chest performed recently and She is a little bit anxious today about the results of her scan.  MEDICAL HISTORY: Past Medical History  Diagnosis Date  . Osteoporosis   . H/O cold sores   . Diverticulosis   . Rectal bleeding   . BCC (basal cell carcinoma of skin)   . right upper lobe lung mass 04/15/2015  . Back pain   . Colon polyps   . Complication of anesthesia     pt. states that she is difficult to arouse  . PONV (postoperative nausea and vomiting)   . Dysrhythmia   . Anxiety   . Complication of anesthesia   . History of anemia     ALLERGIES:  has No Known Allergies.  MEDICATIONS:  Current Outpatient Prescriptions  Medication Sig Dispense Refill  . Biotin 5000 MCG CAPS Take 5,000 mcg by mouth daily.    . Calcium Carb-Cholecalciferol (CALCIUM 1000 + D PO) Take 6 tablets by mouth daily. D3 = 200 IU per each.    . L-LYSINE PO Take  4,000 mg by mouth daily.     Marland Kitchen lidocaine-prilocaine (EMLA) cream Apply 1 application topically as needed. 30 g 0  . Melatonin 3 MG CAPS Take 3 mg by mouth once.    . Misc Natural Products (TART CHERRY ADVANCED PO) Take 1,200 mg by mouth daily.    . Multiple Vitamins-Minerals (CENTRUM SILVER PO) Take 1 tablet by mouth daily.    . Omega-3 Fatty Acids (RA FISH OIL) 1400 MG CPDR Take 2 capsules by mouth daily.     . Turmeric Curcumin 500 MG CAPS Take 2 each by mouth daily.     . vitamin B-12 (CYANOCOBALAMIN) 1000 MCG tablet Take 1,000 mcg by mouth daily.     No current facility-administered medications for this visit.    SURGICAL HISTORY:  Past Surgical History  Procedure Laterality Date  . Cystectomy      right breast x 2  . Colonoscopy    . Colonoscopy w/ polypectomy    . Video bronchoscopy with endobronchial ultrasound N/A 05/07/2015    Procedure: VIDEO BRONCHOSCOPY WITH ENDOBRONCHIAL ULTRASOUND;  Surgeon: Ivin Poot, MD;  Location: [facility];  Service: Thoracic;  Laterality: N/A;  . Mediastinoscopy N/A 05/07/2015    Procedure: MEDIASTINOSCOPY;  Surgeon: Ivin Poot, MD;  Location: [facility] OR;  Service: Thoracic;  Laterality: N/A;    REVIEW OF SYSTEMS:  Constitutional: positive for fatigue Eyes: negative Ears, nose, mouth, throat, and face: negative Respiratory: positive for  cough Cardiovascular: negative Gastrointestinal: negative Genitourinary:negative Integument/breast: negative Hematologic/lymphatic: negative Musculoskeletal:negative Neurological: negative Behavioral/Psych: negative Endocrine: negative Allergic/Immunologic: negative   PHYSICAL EXAMINATION: General appearance: alert, cooperative, fatigued and no distress Head: Normocephalic, without obvious abnormality, atraumatic Neck: no adenopathy, no JVD, supple, symmetrical, trachea midline and thyroid not enlarged, symmetric, no tenderness/mass/nodules Lymph nodes: Cervical, supraclavicular, and axillary nodes  normal. Resp: clear to auscultation bilaterally Back: symmetric, no curvature. ROM normal. No CVA tenderness. Cardio: regular rate and rhythm, S1, S2 normal, no murmur, click, rub or gallop GI: soft, non-tender; bowel sounds normal; no masses,  no organomegaly Extremities: extremities normal, atraumatic, no cyanosis or edema Neurologic: Alert and oriented X 3, normal strength and tone. Normal symmetric reflexes. Normal coordination and gait  ECOG PERFORMANCE STATUS: 1 - Symptomatic but completely ambulatory  Blood pressure 137/90, pulse 98, temperature 97.6 F (36.4 C), temperature source Oral, resp. rate 17, height _0  (1.6 m), weight 144 lb (65.318 kg), SpO2 98 %.  LABORATORY DATA: Lab Results  Component Value Date   WBC 3.5* 08/28/2015   HGB 12.6 08/28/2015   HCT 37.4 08/28/2015   MCV 96.6 08/28/2015   PLT 280 08/28/2015      Chemistry      Component Value Date/Time   NA 141 08/28/2015 1007   NA 139 07/03/2015 1200   K 4.4 08/28/2015 1007   K 4.3 07/03/2015 1200   CL 107 07/03/2015 1200   CO2 27 08/28/2015 1007   CO2 26 07/03/2015 1200   BUN 15.0 08/28/2015 1007   BUN 15 07/03/2015 1200   CREATININE 0.8 08/28/2015 1007   CREATININE 0.56 07/03/2015 1200      Component Value Date/Time   CALCIUM 9.7 08/28/2015 1007   CALCIUM 9.3 07/03/2015 1200   ALKPHOS 54 08/28/2015 1007   ALKPHOS 35* 04/30/2015 1336   AST 16 08/28/2015 1007   AST 27 04/30/2015 1336   ALT 12 08/28/2015 1007   ALT 24 04/30/2015 1336   BILITOT 0.41 08/28/2015 1007   BILITOT 0.6 04/30/2015 1336       RADIOGRAPHIC STUDIES: Ct Chest W Contrast  08/28/2015  CLINICAL DATA:  Restaging right upper lobe lung cancer postradiation therapy. Chemotherapy ongoing. Shortness of breath with cough for 1 week. EXAM: CT CHEST WITH CONTRAST TECHNIQUE: Multidetector CT imaging of the chest was performed during intravenous contrast administration. CONTRAST:  22m OMNIPAQUE IOHEXOL 300 MG/ML  SOLN COMPARISON:   PET-CT 04/22/2015. FINDINGS: Mediastinum/Nodes: 10 mm right hilar node on image 27 is grossly stable from prior PET-CT, although comparison is limited by the lack of contrast on that study. No enlarged mediastinal, internal mammary or axillary lymph nodes are present. 9 mm left thyroid nodule on image 11 is grossly stable. The heart size is normal. There is no pericardial effusion. Left IJ Port-A-Cath tip extends the SVC right atrial junction. There are no significant vascular findings. Lungs/Pleura: There is a small dependent right pleural effusion superiorly. The dominant right upper lobe mass is now partly obscured by adjacent parenchymal changes most consistent with radiation therapy. The lesion itself measures approximately 4.1 x 2.3 cm on image 16 (previously 4.2 x 2.8 cm). There is prominent surrounding opacity with architectural distortion and air bronchograms. There is a new large focal ground-glass density in the superior segment of the right lower lobe, measuring up to 8.3 cm, probably treatment related as well. There is some ill-defined nodularity in the superior segment of the right lower lobe, best seen on images 24 and 26. The left lung  is clear. Upper abdomen: 5 mm subcapsular lesion in the right hepatic lobe on image 64 is unchanged from previous PET-CT. No adrenal mass or worrisome findings. Musculoskeletal/Chest wall: There is no chest wall mass or suspicious osseous finding. IMPRESSION: 1. New changes in the right upper lobe and superior segment of the right lower lobe typical of radiation pneumonitis. 2. The right upper lobe mass is partially obscured by these changes, although appears slightly smaller. Continued follow up recommended. 3. The mediastinal adenopathy demonstrated on prior PET-CT appears improved. No evidence of disease progression. Electronically Signed   By: Richardean Sale M.D.   On: 08/28/2015 13:32    ASSESSMENT AND PLAN: This is a very pleasant 60 years old white female  with stage IIIB non-small cell lung cancer currently undergoing a course of concurrent chemoradiation with weekly carboplatin and paclitaxel is status post 7 cycles and tolerating her treatment fairly well except for the radiation induced esophagitis as well as dry cough. I recommended for the patient to proceed with cycle #7 today as a scheduled. The recent CT scan of the chest showed some improvement of her disease including the right upper lobe lung mass in addition to the mediastinal lymphadenopathy but there was new changes in the right upper lobe and superior segment of the right lower lobe typical of radiation pneumonitis and most likely the cause of her persistent dry cough. I discussed the scan results with the patient and her husband. I recommended for her to continue on observation for few more weeks until she recovered from the adverse effect of the previous treatment. I may consider the patient for 3 cycles of consolidation chemotherapy at that time. For the radiation-induced pneumonitis, I will start the patient on Medrol Dosepak and recommended for her to use over-the-counter Delsym cough syrup. The patient would come back for follow-up visit in 4 weeks for reevaluation and repeat blood work.  She was advised to call immediately if she has any concerning symptoms in the interval. The patient voices understanding of current disease status and treatment options and is in agreement with the current care plan.  All questions were answered. The patient knows to call the clinic with any problems, questions or concerns. We can certainly see the patient much sooner if necessary.  Disclaimer: This note was dictated with voice recognition software. Similar sounding words can inadvertently be transcribed and may not be corrected upon review.

## 2015-09-02 ENCOUNTER — Telehealth: Payer: Self-pay | Admitting: Internal Medicine

## 2015-09-02 NOTE — Telephone Encounter (Signed)
Let message for patient confirming 3/10 lab/fu. Schedule mailed.

## 2015-09-03 ENCOUNTER — Telehealth: Payer: Self-pay | Admitting: Internal Medicine

## 2015-09-03 NOTE — Telephone Encounter (Signed)
Patient called wanting to get a flush appointment prior to her visit, appointment made per patient

## 2015-09-09 ENCOUNTER — Other Ambulatory Visit: Payer: Self-pay | Admitting: Medical Oncology

## 2015-09-09 ENCOUNTER — Telehealth: Payer: Self-pay | Admitting: Internal Medicine

## 2015-09-09 ENCOUNTER — Telehealth: Payer: Self-pay | Admitting: *Deleted

## 2015-09-09 ENCOUNTER — Ambulatory Visit (HOSPITAL_BASED_OUTPATIENT_CLINIC_OR_DEPARTMENT_OTHER): Payer: Managed Care, Other (non HMO)

## 2015-09-09 VITALS — BP 128/86 | HR 78 | Temp 98.4°F | Resp 16

## 2015-09-09 DIAGNOSIS — C3411 Malignant neoplasm of upper lobe, right bronchus or lung: Secondary | ICD-10-CM

## 2015-09-09 DIAGNOSIS — Z452 Encounter for adjustment and management of vascular access device: Secondary | ICD-10-CM

## 2015-09-09 DIAGNOSIS — Z95828 Presence of other vascular implants and grafts: Secondary | ICD-10-CM

## 2015-09-09 MED ORDER — HEPARIN SOD (PORK) LOCK FLUSH 100 UNIT/ML IV SOLN
500.0000 [IU] | Freq: Once | INTRAVENOUS | Status: AC
Start: 2015-09-09 — End: 2015-09-09
  Administered 2015-09-09: 500 [IU] via INTRAVENOUS
  Filled 2015-09-09: qty 5

## 2015-09-09 MED ORDER — SODIUM CHLORIDE 0.9% FLUSH
10.0000 mL | INTRAVENOUS | Status: DC | PRN
  Administered 2015-09-09: 10 mL via INTRAVENOUS
  Filled 2015-09-09: qty 10

## 2015-09-09 NOTE — Telephone Encounter (Signed)
per pof to r/s pt appt-cld pt and left message of r/s time & date for 3/27

## 2015-09-09 NOTE — Patient Instructions (Signed)

## 2015-09-09 NOTE — Telephone Encounter (Signed)
Pt reported to [facility] with concerns about increase in back pain, difficulty breathing when supine. Pt states she is very anxious. Spoke to Dr. Julien Nordmann who advised this is normal coming off of steroids, continue to monitor and call us on Friday if still problematic. Pt verbalized an understanding. Discussed using some tylenol for the pain. Pt voiced a concern about some post nasal drip recently, no productive cough just a "clearing the throat cough." Pt is going to start taking an antihistamine daily to see if this improves. Pt understand to call this office if any other concerns or questions. She will call on Friday with a report.

## 2015-09-10 ENCOUNTER — Telehealth: Payer: Self-pay | Admitting: Internal Medicine

## 2015-09-10 NOTE — Telephone Encounter (Signed)
Pt called to r/s labs/ov due to she will be out of town, pt confirmed updated labs/flush/ov D/T.Marland Kitchen KJ

## 2015-09-17 ENCOUNTER — Telehealth: Payer: Self-pay

## 2015-09-17 NOTE — Telephone Encounter (Signed)
I called pt and instructed her to contact PCP since she is under observation with [facility].

## 2015-09-17 NOTE — Telephone Encounter (Signed)
Patient called today c/o SOB and wheezing.  She is concerned because when she is active she can hear herself wheeze and feels as if she is "breathless".  Patient is calling wondering if Dr. Julien Nordmann would prescribe an inhaler in hopes that this may help her with her SOB and wheezing.  She is also c/o pain in her upper back on the right side that radiates to the front part of her chest.  She has taken advil several times which seems to help with the pain which she is rating 4-5/10.

## 2015-09-18 ENCOUNTER — Other Ambulatory Visit: Payer: Self-pay | Admitting: Family Medicine

## 2015-09-18 DIAGNOSIS — N958 Other specified menopausal and perimenopausal disorders: Secondary | ICD-10-CM

## 2015-09-24 ENCOUNTER — Ambulatory Visit: Payer: Managed Care, Other (non HMO)

## 2015-10-02 ENCOUNTER — Other Ambulatory Visit: Payer: Managed Care, Other (non HMO)

## 2015-10-02 ENCOUNTER — Ambulatory Visit: Payer: Managed Care, Other (non HMO) | Admitting: Internal Medicine

## 2015-10-08 ENCOUNTER — Other Ambulatory Visit: Payer: Self-pay | Admitting: Family Medicine

## 2015-10-08 DIAGNOSIS — M810 Age-related osteoporosis without current pathological fracture: Secondary | ICD-10-CM

## 2015-10-09 ENCOUNTER — Ambulatory Visit
Admission: RE | Admit: 2015-10-09 | Discharge: 2015-10-09 | Disposition: A | Discharge status: Home / Self Care | Payer: Managed Care, Other (non HMO) | Source: Ambulatory Visit | Attending: Family Medicine | Admitting: Family Medicine

## 2015-10-09 ENCOUNTER — Ambulatory Visit
Admission: RE | Admit: 2015-10-09 | Discharge: 2015-10-09 | Disposition: A | Discharge status: Home / Self Care | Payer: Managed Care, Other (non HMO) | Source: Ambulatory Visit

## 2015-10-09 DIAGNOSIS — M810 Age-related osteoporosis without current pathological fracture: Secondary | ICD-10-CM

## 2015-10-09 DIAGNOSIS — Z1231 Encounter for screening mammogram for malignant neoplasm of breast: Secondary | ICD-10-CM

## 2015-10-09 DIAGNOSIS — Z85118 Personal history of other malignant neoplasm of bronchus and lung: Secondary | ICD-10-CM

## 2015-10-12 ENCOUNTER — Ambulatory Visit (HOSPITAL_BASED_OUTPATIENT_CLINIC_OR_DEPARTMENT_OTHER): Payer: Managed Care, Other (non HMO)

## 2015-10-12 ENCOUNTER — Other Ambulatory Visit: Payer: Self-pay | Admitting: Medical Oncology

## 2015-10-12 ENCOUNTER — Other Ambulatory Visit (HOSPITAL_BASED_OUTPATIENT_CLINIC_OR_DEPARTMENT_OTHER): Payer: Managed Care, Other (non HMO)

## 2015-10-12 ENCOUNTER — Encounter: Payer: Self-pay | Admitting: Internal Medicine

## 2015-10-12 ENCOUNTER — Ambulatory Visit (HOSPITAL_BASED_OUTPATIENT_CLINIC_OR_DEPARTMENT_OTHER): Payer: Managed Care, Other (non HMO) | Admitting: Internal Medicine

## 2015-10-12 ENCOUNTER — Telehealth: Payer: Self-pay | Admitting: Internal Medicine

## 2015-10-12 ENCOUNTER — Telehealth: Payer: Self-pay | Admitting: *Deleted

## 2015-10-12 VITALS — BP 132/85 | HR 102 | Temp 98.4°F | Resp 18 | Ht 63.0 in | Wt 139.0 lb

## 2015-10-12 DIAGNOSIS — Z5111 Encounter for antineoplastic chemotherapy: Secondary | ICD-10-CM

## 2015-10-12 DIAGNOSIS — C3411 Malignant neoplasm of upper lobe, right bronchus or lung: Secondary | ICD-10-CM

## 2015-10-12 DIAGNOSIS — Z95828 Presence of other vascular implants and grafts: Secondary | ICD-10-CM

## 2015-10-12 LAB — COMPREHENSIVE METABOLIC PANEL
ALT: 14 U/L (ref 0–55)
ANION GAP: 9 meq/L (ref 3–11)
AST: 19 U/L (ref 5–34)
Albumin: 3.5 g/dL (ref 3.5–5.0)
Alkaline Phosphatase: 57 U/L (ref 40–150)
BUN: 20.1 mg/dL (ref 7.0–26.0)
CALCIUM: 9.9 mg/dL (ref 8.4–10.4)
CHLORIDE: 104 meq/L (ref 98–109)
CO2: 28 meq/L (ref 22–29)
CREATININE: 0.7 mg/dL (ref 0.6–1.1)
Glucose: 93 mg/dl (ref 70–140)
Potassium: 4.2 mEq/L (ref 3.5–5.1)
SODIUM: 141 meq/L (ref 136–145)
Total Protein: 6.7 g/dL (ref 6.4–8.3)

## 2015-10-12 LAB — CBC WITH DIFFERENTIAL/PLATELET
BASO%: 0.5 % (ref 0.0–2.0)
Basophils Absolute: 0 10*3/uL (ref 0.0–0.1)
EOS ABS: 0.1 10*3/uL (ref 0.0–0.5)
EOS%: 1.8 % (ref 0.0–7.0)
HCT: 38.2 % (ref 34.8–46.6)
HGB: 12.5 g/dL (ref 11.6–15.9)
LYMPH%: 14.7 % (ref 14.0–49.7)
MCH: 30.6 pg (ref 25.1–34.0)
MCHC: 32.7 g/dL (ref 31.5–36.0)
MCV: 93.7 fL (ref 79.5–101.0)
MONO#: 0.5 10*3/uL (ref 0.1–0.9)
MONO%: 8 % (ref 0.0–14.0)
NEUT%: 75 % (ref 38.4–76.8)
NEUTROS ABS: 5.1 10*3/uL (ref 1.5–6.5)
PLATELETS: 311 10*3/uL (ref 145–400)
RBC: 4.07 10*6/uL (ref 3.70–5.45)
RDW: 13.3 % (ref 11.2–14.5)
WBC: 6.8 10*3/uL (ref 3.9–10.3)
lymph#: 1 10*3/uL (ref 0.9–3.3)

## 2015-10-12 MED ORDER — FOLIC ACID 1 MG PO TABS: 1.0000 mg | ORAL_TABLET | Freq: Every day | ORAL | Status: DC

## 2015-10-12 MED ORDER — DEXAMETHASONE 4 MG PO TABS: ORAL_TABLET | ORAL | Status: DC

## 2015-10-12 MED ORDER — HEPARIN SOD (PORK) LOCK FLUSH 100 UNIT/ML IV SOLN
500.0000 [IU] | Freq: Once | INTRAVENOUS | Status: AC
  Administered 2015-10-12: 500 [IU] via INTRAVENOUS
  Filled 2015-10-12: qty 5

## 2015-10-12 MED ORDER — SODIUM CHLORIDE 0.9% FLUSH
10.0000 mL | INTRAVENOUS | Status: DC | PRN
  Administered 2015-10-12: 10 mL via INTRAVENOUS
  Filled 2015-10-12: qty 10

## 2015-10-12 MED ORDER — CYANOCOBALAMIN 1000 MCG/ML IJ SOLN
1000.0000 ug | Freq: Once | INTRAMUSCULAR | Status: AC
  Administered 2015-10-12: 1000 ug via INTRAMUSCULAR

## 2015-10-12 NOTE — Telephone Encounter (Signed)
per pof to sch pt appt-MW sch trmt-gave pt copy of avs

## 2015-10-12 NOTE — Telephone Encounter (Signed)
Per staff message and POF I have scheduled appts. Advised scheduler of appts. JMW  

## 2015-10-12 NOTE — Patient Instructions (Signed)

## 2015-10-12 NOTE — Progress Notes (Signed)
.      [place] Telephone:(336) [phone]   Fax:(336) [phone]  OFFICE PROGRESS NOTE  Rose Figueroa, Rose G, MD [address] [place] [address]  DIAGNOSIS: Stage IIIB (T2a, N3, M0) non-small cell lung cancer, adenocarcinoma with negative EGFR mutation and negative ALK gene translocation diagnosed in October 2016.  PRIOR THERAPY: Concurrent chemoradiation with weekly carboplatin for AUC of 2 and paclitaxel 45 MG/M2 status post 6 cycles. Last dose was given 07/13/2015 with partial response.  CURRENT THERAPY: Systemic chemotherapy with carboplatin for AUC of 5 and Alimta 500 MG/M2 every 3 weeks. First dose 11/02/2015.  INTERVAL HISTORY: Rose Figueroa 60 y.o. female returns to the clinic today for follow-up visit. The patient is feeling much better now after taking few weeks with no treatment. She continues to have wheezes in her chest. She denied having any significant weight loss or night sweats. She denied having any chest pain, shortness of breath, cough or hemoptysis. She has no significant nausea or vomiting, no fever or chills. She is here today for evaluation and discussion of her treatment options.  MEDICAL HISTORY: Past Medical History  Diagnosis Date  . Osteoporosis   . H/O cold sores   . Diverticulosis   . Rectal bleeding   . BCC (basal cell carcinoma of skin)   . right upper lobe lung mass 04/15/2015  . Back pain   . Colon polyps   . Complication of anesthesia     pt. states that she is difficult to arouse  . PONV (postoperative nausea and vomiting)   . Dysrhythmia   . Anxiety   . Complication of anesthesia   . History of anemia     ALLERGIES:  has No Known Allergies.  MEDICATIONS:  Current Outpatient Prescriptions  Medication Sig Dispense Refill  . albuterol (PROVENTIL HFA;VENTOLIN HFA) 108 (90 Base) MCG/ACT inhaler Inhale 1 puff into the lungs every 6 (six) hours as needed for wheezing or shortness of breath.    . Biotin 5000 MCG CAPS  Take 5,000 mcg by mouth daily.    . Calcium Carb-Cholecalciferol (CALCIUM 1000 + D PO) Take 5 tablets by mouth daily. D3 = 400 IU per each.    . cetirizine (ZYRTEC) 10 MG tablet Take 10 mg by mouth daily.    Marland Kitchen L-LYSINE PO Take 3,000 mg by mouth daily.     Marland Kitchen lidocaine-prilocaine (EMLA) cream Apply 1 application topically as needed. 30 Figueroa 0  . Melatonin 3 MG CAPS Take 3 mg by mouth once.    . Misc Natural Products (TART CHERRY ADVANCED PO) Take 1,200 mg by mouth daily.    . Multiple Vitamins-Minerals (CENTRUM SILVER PO) Take 1 tablet by mouth daily.    . Omega-3 Fatty Acids (RA FISH OIL) 1400 MG CPDR Take 2 capsules by mouth daily.     . Turmeric Curcumin 500 MG CAPS Take 1 each by mouth daily.     . vitamin B-12 (CYANOCOBALAMIN) 1000 MCG tablet Take 1,000 mcg by mouth daily.     No current facility-administered medications for this visit.    SURGICAL HISTORY:  Past Surgical History  Procedure Laterality Date  . Cystectomy      right breast x 2  . Colonoscopy    . Colonoscopy w/ polypectomy    . Video bronchoscopy with endobronchial ultrasound N/A 05/07/2015    Procedure: VIDEO BRONCHOSCOPY WITH ENDOBRONCHIAL ULTRASOUND;  Surgeon: Ivin Poot, MD;  Location: [place];  Service: Thoracic;  Laterality: N/A;  .  Mediastinoscopy N/A 05/07/2015    Procedure: MEDIASTINOSCOPY;  Surgeon: Ivin Poot, MD;  Location: [place];  Service: Thoracic;  Laterality: N/A;    REVIEW OF SYSTEMS:  Constitutional: negative Eyes: negative Ears, nose, mouth, throat, and face: negative Respiratory: positive for cough and wheezing Cardiovascular: negative Gastrointestinal: negative Genitourinary:negative Integument/breast: negative Hematologic/lymphatic: negative Musculoskeletal:negative Neurological: negative Behavioral/Psych: negative Endocrine: negative Allergic/Immunologic: negative   PHYSICAL EXAMINATION: General appearance: alert, cooperative, fatigued and no distress Head: Normocephalic,  without obvious abnormality, atraumatic Neck: no adenopathy, no JVD, supple, symmetrical, trachea midline and thyroid not enlarged, symmetric, no tenderness/mass/nodules Lymph nodes: Cervical, supraclavicular, and axillary nodes normal. Resp: clear to auscultation bilaterally Back: symmetric, no curvature. ROM normal. No CVA tenderness. Cardio: regular rate and rhythm, S1, S2 normal, no murmur, click, rub or gallop GI: soft, non-tender; bowel sounds normal; no masses,  no organomegaly Extremities: extremities normal, atraumatic, no cyanosis or edema Neurologic: Alert and oriented X 3, normal strength and tone. Normal symmetric reflexes. Normal coordination and gait  ECOG PERFORMANCE STATUS: 1 - Symptomatic but completely ambulatory  Blood pressure 132/85, pulse 102, temperature 98.4 F (36.9 C), temperature source Oral, resp. rate 18, height 5' 3"  (1.6 m), weight 139 lb (63.05 kg), SpO2 98 %.  LABORATORY DATA: Lab Results  Component Value Date   WBC 6.8 10/12/2015   HGB 12.5 10/12/2015   HCT 38.2 10/12/2015   MCV 93.7 10/12/2015   PLT 311 10/12/2015      Chemistry      Component Value Date/Time   NA 141 08/28/2015 1007   NA 139 07/03/2015 1200   K 4.4 08/28/2015 1007   K 4.3 07/03/2015 1200   CL 107 07/03/2015 1200   CO2 27 08/28/2015 1007   CO2 26 07/03/2015 1200   BUN 15.0 08/28/2015 1007   BUN 15 07/03/2015 1200   CREATININE 0.8 08/28/2015 1007   CREATININE 0.56 07/03/2015 1200      Component Value Date/Time   CALCIUM 9.7 08/28/2015 1007   CALCIUM 9.3 07/03/2015 1200   ALKPHOS 54 08/28/2015 1007   ALKPHOS 35* 04/30/2015 1336   AST 16 08/28/2015 1007   AST 27 04/30/2015 1336   ALT 12 08/28/2015 1007   ALT 24 04/30/2015 1336   BILITOT 0.41 08/28/2015 1007   BILITOT 0.6 04/30/2015 1336       RADIOGRAPHIC STUDIES: Dg Bone Density  10/09/2015  EXAM: DUAL X-RAY ABSORPTIOMETRY (DXA) FOR BONE MINERAL DENSITY IMPRESSION: Referring Physician:  Howie Ill  PATIENT: Name: Rose Figueroa, Rose Figueroa Patient ID: [id number] Birth Date: 1956-02-16 Height: 62.5 in. Sex: Female Measured: 10/09/2015 Weight: 136.8 lbs. Indications: Estrogen Deficient, Family History of Osteoporosis, Height Loss (781.91), Osteoporosis (733), Postmenopausal Fractures: None Treatments: Calcium (E943.0), Prolia, Vitamin D (E933.5) ASSESSMENT: The BMD measured at Femur Total Right is 0.678 Figueroa/cm2 with a T-score of -2.6. This patient is considered OSTEOPOROTIC according to [facility]) criteria. There has been a statistically significant increase in BMD of Lumbar spine since prior exam dated 06/06/2013. There has been a statistically significant decrease in BMD of left hip since prior exam dated 06/06/2013. Site Region Measured Date Measured Age YA BMD Significant CHANGE T-score DualFemur Total Right 10/09/2015    59.8         -2.6    0.678 Figueroa/cm2 AP Spine L1-L4 10/09/2015 59.8 -2.3 0.916 Figueroa/cm2 * World Health Organization [facility]) criteria for post-menopausal, Caucasian Women: Normal       T-score at or above -1 SD Osteopenia   T-score between -1  and -2.5 SD Osteoporosis T-score at or below -2.5 SD RECOMMENDATION: [place] recommends that FDA-approved medical therapies be considered in postmenopausal women and men age 93 or older with a: 1. Hip or vertebral (clinical or morphometric) fracture. 2. T-score of <-2.5 at the spine or hip. 3. Ten-year fracture probability by FRAX of 3% or greater for hip fracture or 20% or greater for major osteoporotic fracture. All treatment decisions require clinical judgment and consideration of individual patient factors, including patient preferences, co-morbidities, previous drug use, risk factors not captured in the FRAX model (e.Figueroa. falls, vitamin D deficiency, increased bone turnover, interval significant decline in bone density) and possible under - or over-estimation of fracture risk by FRAX. All patients should ensure an adequate  intake of dietary calcium (1200 mg/d) and vitamin D (800 IU daily) unless contraindicated. FOLLOW-UP: People with diagnosed cases of osteoporosis or at high risk for fracture should have regular bone mineral density tests. For patients eligible for Medicare, routine testing is allowed once every 2 years. The testing frequency can be increased to one year for patients who have rapidly progressing disease, those who are receiving or discontinuing medical therapy to restore bone mass, or have additional risk factors. I have reviewed this report, and agree with the above findings. Mark A. Thornton Papas, M.D. [facility] Electronically Signed   By: Lavonia Dana M.D.   On: 10/09/2015 15:40   Mm Screening Breast Tomo Bilateral  10/09/2015  CLINICAL DATA:  Screening. EXAM: 2D DIGITAL SCREENING BILATERAL MAMMOGRAM WITH CAD AND ADJUNCT TOMO COMPARISON:  Previous exam(s). ACR Breast Density Category b: There are scattered areas of fibroglandular density. FINDINGS: In the left breast, a possible asymmetry warrants further evaluation. In the right breast, no findings suspicious for malignancy. Images were processed with CAD. IMPRESSION: Further evaluation is suggested for possible asymmetry in the left breast. RECOMMENDATION: Diagnostic mammogram and possibly ultrasound of the left breast. (Code:[id number]-[id number]) The patient will be contacted regarding the findings, and additional imaging will be scheduled. BI-RADS CATEGORY  0: Incomplete. Need additional imaging evaluation and/or prior mammograms for comparison. Electronically Signed   By: Abelardo Diesel M.D.   On: 10/09/2015 16:09    ASSESSMENT AND PLAN: This is a very pleasant 60 years old white female with stage IIIB non-small cell lung cancer currently undergoing a course of concurrent chemoradiation with weekly carboplatin and paclitaxel is status post 7 cycles and tolerating her treatment fairly well except for the radiation induced esophagitis as well as dry cough. She is  now recovering well from the side effects except for wheezing. She is feeling much better and ready for consolidation systemic chemotherapy. I had a lengthy discussion with the patient today about her condition and treatment options. I recommended for her to consider a course of consolidation chemotherapy with carboplatin for AUC of 5 and Alimta 500 MG/M2 every 3 weeks. I discussed with the patient adverse effects of this treatment including but not limited to alopecia, myelosuppression, nausea and vomiting, peripheral neuropathy, liver or renal dysfunction. We will arrange for the patient to receive vitamin B 12 injection today. The patient would also receive prescription for Compazine 10 mg by mouth every 6 hours as needed for nausea, Decadron 4 mg by mouth twice a day, the day before, day of and day after the chemotherapy in addition to folic acid 1 mg by mouth daily. I will arrange for the patient to have a chemotherapy education class before starting the first dose of the chemotherapy. She is expected  to start the first cycle of this treatment on 11/02/2015 after coming from vacation in [place]. The patient would come back for follow-up visit in 3 weeks for reevaluation and repeat blood work before starting the first cycle of her treatment. She was advised to call immediately if she has any concerning symptoms in the interval. The patient voices understanding of current disease status and treatment options and is in agreement with the current care plan.  All questions were answered. The patient knows to call the clinic with any problems, questions or concerns. We can certainly see the patient much sooner if necessary.  Disclaimer: This note was dictated with voice recognition software. Similar sounding words can inadvertently be transcribed and may not be corrected upon review.

## 2015-10-13 ENCOUNTER — Other Ambulatory Visit: Payer: Self-pay | Admitting: Internal Medicine

## 2015-10-13 DIAGNOSIS — R928 Other abnormal and inconclusive findings on diagnostic imaging of breast: Secondary | ICD-10-CM

## 2015-10-19 ENCOUNTER — Other Ambulatory Visit: Payer: Managed Care, Other (non HMO)

## 2015-10-19 ENCOUNTER — Ambulatory Visit: Payer: Managed Care, Other (non HMO) | Admitting: Internal Medicine

## 2015-11-02 ENCOUNTER — Other Ambulatory Visit: Payer: Self-pay | Admitting: *Deleted

## 2015-11-02 ENCOUNTER — Encounter: Payer: Self-pay | Admitting: Internal Medicine

## 2015-11-02 ENCOUNTER — Ambulatory Visit (HOSPITAL_BASED_OUTPATIENT_CLINIC_OR_DEPARTMENT_OTHER): Payer: Managed Care, Other (non HMO)

## 2015-11-02 ENCOUNTER — Ambulatory Visit (HOSPITAL_BASED_OUTPATIENT_CLINIC_OR_DEPARTMENT_OTHER): Payer: Managed Care, Other (non HMO) | Admitting: Lab

## 2015-11-02 ENCOUNTER — Ambulatory Visit (HOSPITAL_BASED_OUTPATIENT_CLINIC_OR_DEPARTMENT_OTHER): Payer: Managed Care, Other (non HMO) | Admitting: Internal Medicine

## 2015-11-02 ENCOUNTER — Ambulatory Visit: Payer: Managed Care, Other (non HMO)

## 2015-11-02 VITALS — BP 136/89 | HR 76 | Temp 97.8°F | Resp 18 | Ht 63.0 in | Wt 140.4 lb

## 2015-11-02 DIAGNOSIS — C3411 Malignant neoplasm of upper lobe, right bronchus or lung: Secondary | ICD-10-CM | POA: Diagnosis not present

## 2015-11-02 DIAGNOSIS — Z5111 Encounter for antineoplastic chemotherapy: Secondary | ICD-10-CM

## 2015-11-02 LAB — CBC WITH DIFFERENTIAL/PLATELET
BASO%: 0.2 % (ref 0.0–2.0)
Basophils Absolute: 0 10*3/uL (ref 0.0–0.1)
EOS%: 0 % (ref 0.0–7.0)
Eosinophils Absolute: 0 10*3/uL (ref 0.0–0.5)
HCT: 37.7 % (ref 34.8–46.6)
HEMOGLOBIN: 12.6 g/dL (ref 11.6–15.9)
LYMPH#: 0.9 10*3/uL (ref 0.9–3.3)
LYMPH%: 13.7 % — ABNORMAL LOW (ref 14.0–49.7)
MCH: 30.7 pg (ref 25.1–34.0)
MCHC: 33.4 g/dL (ref 31.5–36.0)
MCV: 91.7 fL (ref 79.5–101.0)
MONO#: 0.6 10*3/uL (ref 0.1–0.9)
MONO%: 9.3 % (ref 0.0–14.0)
NEUT%: 76.8 % (ref 38.4–76.8)
NEUTROS ABS: 5 10*3/uL (ref 1.5–6.5)
Platelets: 256 10*3/uL (ref 145–400)
RBC: 4.11 10*6/uL (ref 3.70–5.45)
RDW: 13.3 % (ref 11.2–14.5)
WBC: 6.5 10*3/uL (ref 3.9–10.3)
nRBC: 0 % (ref 0–0)

## 2015-11-02 LAB — COMPREHENSIVE METABOLIC PANEL
ALBUMIN: 3.8 g/dL (ref 3.5–5.0)
ALK PHOS: 52 U/L (ref 40–150)
ALT: 14 U/L (ref 0–55)
ANION GAP: 8 meq/L (ref 3–11)
AST: 18 U/L (ref 5–34)
BUN: 16.6 mg/dL (ref 7.0–26.0)
CALCIUM: 9.9 mg/dL (ref 8.4–10.4)
CO2: 25 mEq/L (ref 22–29)
Chloride: 106 mEq/L (ref 98–109)
Creatinine: 0.7 mg/dL (ref 0.6–1.1)
GLUCOSE: 99 mg/dL (ref 70–140)
POTASSIUM: 4.2 meq/L (ref 3.5–5.1)
SODIUM: 139 meq/L (ref 136–145)
Total Bilirubin: <0.3 mg/dL (ref 0.20–1.20)
Total Protein: 6.9 g/dL (ref 6.4–8.3)

## 2015-11-02 MED ORDER — SODIUM CHLORIDE 0.9 % IV SOLN
480.0000 mg/m2 | Freq: Once | INTRAVENOUS | Status: AC
  Administered 2015-11-02: 800 mg via INTRAVENOUS
  Filled 2015-11-02: qty 28

## 2015-11-02 MED ORDER — PALONOSETRON HCL INJECTION 0.25 MG/5ML
INTRAVENOUS | Status: AC
  Filled 2015-11-02: qty 5

## 2015-11-02 MED ORDER — SODIUM CHLORIDE 0.9 % IV SOLN
502.0000 mg | Freq: Once | INTRAVENOUS | Status: AC
  Administered 2015-11-02: 500 mg via INTRAVENOUS
  Filled 2015-11-02: qty 50

## 2015-11-02 MED ORDER — SODIUM CHLORIDE 0.9 % IV SOLN
10.0000 mg | Freq: Once | INTRAVENOUS | Status: AC
  Administered 2015-11-02: 10 mg via INTRAVENOUS
  Filled 2015-11-02: qty 1

## 2015-11-02 MED ORDER — SODIUM CHLORIDE 0.9% FLUSH
10.0000 mL | INTRAVENOUS | Status: DC | PRN
  Administered 2015-11-02: 10 mL
  Filled 2015-11-02: qty 10

## 2015-11-02 MED ORDER — SODIUM CHLORIDE 0.9 % IV SOLN
Freq: Once | INTRAVENOUS | Status: AC
  Administered 2015-11-02: 09:00:00 via INTRAVENOUS

## 2015-11-02 MED ORDER — PALONOSETRON HCL INJECTION 0.25 MG/5ML
0.2500 mg | Freq: Once | INTRAVENOUS | Status: AC
  Administered 2015-11-02: 0.25 mg via INTRAVENOUS

## 2015-11-02 MED ORDER — HEPARIN SOD (PORK) LOCK FLUSH 100 UNIT/ML IV SOLN
500.0000 [IU] | Freq: Once | INTRAVENOUS | Status: AC | PRN
  Administered 2015-11-02: 500 [IU]
  Filled 2015-11-02: qty 5

## 2015-11-02 NOTE — Progress Notes (Signed)
.      [place] Telephone:(336) [phone]   [phone]  OFFICE PROGRESS NOTE  Rose Figueroa, [place] [address]  DIAGNOSIS: Stage IIIB (T2a, N3, M0) non-small cell lung cancer, adenocarcinoma with negative EGFR mutation and negative ALK gene translocation diagnosed in October 2016.  PRIOR THERAPY: Concurrent chemoradiation with weekly carboplatin for AUC of 2 and paclitaxel 45 MG/M2 status post 6 cycles. Last dose was given 07/13/2015 with partial response.  CURRENT THERAPY: Systemic chemotherapy with carboplatin for AUC of 5 and Alimta 500 MG/M2 every 3 weeks. First dose 11/02/2015.  INTERVAL HISTORY: Rose Figueroa 60 y.o. female returns to the clinic today for follow-up visit accompanied by her husband. The patient is feeling fine today with no specific complaints. She enjoyed her vacation in [place]. She denied having any significant weight loss or night sweats. She denied having any chest pain, shortness of breath, cough or hemoptysis. She has no significant nausea or vomiting, no fever or chills. She is here today to start cycle #1 of her systemic chemotherapy with carboplatin and Alimta.  MEDICAL HISTORY: Past Medical History  Diagnosis Date  . Osteoporosis   . H/O cold sores   . Diverticulosis   . Rectal bleeding   . BCC (basal cell carcinoma of skin)   . right upper lobe lung mass 04/15/2015  . Back pain   . Colon polyps   . Complication of anesthesia     pt. states that she is difficult to arouse  . PONV (postoperative nausea and vomiting)   . Dysrhythmia   . Anxiety   . Complication of anesthesia   . History of anemia     ALLERGIES:  has No Known Allergies.  MEDICATIONS:  Current Outpatient Prescriptions  Medication Sig Dispense Refill  . albuterol (PROVENTIL HFA;VENTOLIN HFA) 108 (90 Base) MCG/ACT inhaler Inhale 1 puff into the lungs every 6 (six) hours as needed for wheezing or shortness of breath.    .  Biotin 5000 MCG CAPS Take 5,000 mcg by mouth daily.    . Calcium Carb-Cholecalciferol (CALCIUM 1000 + D PO) Take 5 tablets by mouth daily. D3 = 400 IU per each.    . cetirizine (ZYRTEC) 10 MG tablet Take 10 mg by mouth daily.    Marland Kitchen dexamethasone (DECADRON) 4 MG tablet 4 mg by mouth twice a day the day before, day of and day after the chemotherapy every 3 weeks 40 tablet 1  . folic acid (FOLVITE) 1 MG tablet Take 1 tablet (1 mg total) by mouth daily. 30 tablet 4  . L-LYSINE PO Take 3,000 mg by mouth daily.     Marland Kitchen lidocaine-prilocaine (EMLA) cream Apply 1 application topically as needed. 30 g 0  . Melatonin 3 MG CAPS Take 3 mg by mouth once.    . Misc Natural Products (TART CHERRY ADVANCED PO) Take 1,200 mg by mouth daily.    . Multiple Vitamins-Minerals (CENTRUM SILVER PO) Take 1 tablet by mouth daily.    . Omega-3 Fatty Acids (RA FISH OIL) 1400 MG CPDR Take 2 capsules by mouth daily.     . Turmeric Curcumin 500 MG CAPS Take 1 each by mouth daily.     . vitamin B-12 (CYANOCOBALAMIN) 1000 MCG tablet Take 1,000 mcg by mouth daily.     No current facility-administered medications for this visit.    SURGICAL HISTORY:  Past Surgical History  Procedure Laterality Date  . Cystectomy      right  breast x 2  . Colonoscopy    . Colonoscopy w/ polypectomy    . Video bronchoscopy with endobronchial ultrasound N/A 05/07/2015    Procedure: VIDEO BRONCHOSCOPY WITH ENDOBRONCHIAL ULTRASOUND;  Surgeon: Ivin Poot, MD;  Location: [facility];  Service: Thoracic;  Laterality: N/A;  . Mediastinoscopy N/A 05/07/2015    Procedure: MEDIASTINOSCOPY;  Surgeon: Ivin Poot, MD;  Location: [facility];  Service: Thoracic;  Laterality: N/A;    REVIEW OF SYSTEMS:  A comprehensive review of systems was negative except for: Respiratory: positive for cough and wheezing   PHYSICAL EXAMINATION: General appearance: alert, cooperative, fatigued and no distress Head: Normocephalic, without obvious abnormality, atraumatic Neck:  no adenopathy, no JVD, supple, symmetrical, trachea midline and thyroid not enlarged, symmetric, no tenderness/mass/nodules Lymph nodes: Cervical, supraclavicular, and axillary nodes normal. Resp: clear to auscultation bilaterally Back: symmetric, no curvature. ROM normal. No CVA tenderness. Cardio: regular rate and rhythm, S1, S2 normal, no murmur, click, rub or gallop GI: soft, non-tender; bowel sounds normal; no masses,  no organomegaly Extremities: extremities normal, atraumatic, no cyanosis or edema Neurologic: Alert and oriented X 3, normal strength and tone. Normal symmetric reflexes. Normal coordination and gait  ECOG PERFORMANCE STATUS: 1 - Symptomatic but completely ambulatory  Blood pressure 136/89, pulse 76, temperature 97.8 F (36.6 C), temperature source Oral, resp. rate 18, height _0  (1.6 m), weight 140 lb 6.4 oz (63.685 kg), SpO2 100 %.  LABORATORY DATA: Lab Results  Component Value Date   WBC 6.5 11/02/2015   HGB 12.6 11/02/2015   HCT 37.7 11/02/2015   MCV 91.7 11/02/2015   PLT 256 11/02/2015      Chemistry      Component Value Date/Time   NA 141 10/12/2015 1436   NA 139 07/03/2015 1200   K 4.2 10/12/2015 1436   K 4.3 07/03/2015 1200   CL 107 07/03/2015 1200   CO2 28 10/12/2015 1436   CO2 26 07/03/2015 1200   BUN 20.1 10/12/2015 1436   BUN 15 07/03/2015 1200   CREATININE 0.7 10/12/2015 1436   CREATININE 0.56 07/03/2015 1200      Component Value Date/Time   CALCIUM 9.9 10/12/2015 1436   CALCIUM 9.3 07/03/2015 1200   ALKPHOS 57 10/12/2015 1436   ALKPHOS 35* 04/30/2015 1336   AST 19 10/12/2015 1436   AST 27 04/30/2015 1336   ALT 14 10/12/2015 1436   ALT 24 04/30/2015 1336   BILITOT <0.30 10/12/2015 1436   BILITOT 0.6 04/30/2015 1336       RADIOGRAPHIC STUDIES: Dg Bone Density  10/09/2015  EXAM: DUAL X-RAY ABSORPTIOMETRY (DXA) FOR BONE MINERAL DENSITY IMPRESSION: Referring Physician:  Howie Ill PATIENT: Name: Rose Figueroa, Rose Figueroa Patient ID:  [id number] Birth Date: 1956/06/22 Height: 62.5 in. Sex: Female Measured: 10/09/2015 Weight: 136.8 lbs. Indications: Estrogen Deficient, Family History of Osteoporosis, Height Loss (781.91), Osteoporosis (733), Postmenopausal Fractures: None Treatments: Calcium (E943.0), Prolia, Vitamin D (E933.5) ASSESSMENT: The BMD measured at Femur Total Right is 0.678 g/cm2 with a T-score of -2.6. This patient is considered OSTEOPOROTIC according to [employer]) criteria. There has been a statistically significant increase in BMD of Lumbar spine since prior exam dated 06/06/2013. There has been a statistically significant decrease in BMD of left hip since prior exam dated 06/06/2013. Site Region Measured Date Measured Age YA BMD Significant CHANGE T-score DualFemur Total Right 10/09/2015    59.8         -2.6    0.678 g/cm2 AP Spine L1-L4 10/09/2015  59.8 -2.3 0.916 g/cm2 * World Health Organization [facility]) criteria for post-menopausal, Caucasian Women: Normal       T-score at or above -1 SD Osteopenia   T-score between -1 and -2.5 SD Osteoporosis T-score at or below -2.5 SD RECOMMENDATION: [place] recommends that FDA-approved medical therapies be considered in postmenopausal women and men age 86 or older with a: 1. Hip or vertebral (clinical or morphometric) fracture. 2. T-score of <-2.5 at the spine or hip. 3. Ten-year fracture probability by FRAX of 3% or greater for hip fracture or 20% or greater for major osteoporotic fracture. All treatment decisions require clinical judgment and consideration of individual patient factors, including patient preferences, co-morbidities, previous drug use, risk factors not captured in the FRAX model (e.g. falls, vitamin D deficiency, increased bone turnover, interval significant decline in bone density) and possible under - or over-estimation of fracture risk by FRAX. All patients should ensure an adequate intake of dietary calcium (1200 mg/d) and  vitamin D (800 IU daily) unless contraindicated. FOLLOW-UP: People with diagnosed cases of osteoporosis or at high risk for fracture should have regular bone mineral density tests. For patients eligible for Medicare, routine testing is allowed once every 2 years. The testing frequency can be increased to one year for patients who have rapidly progressing disease, those who are receiving or discontinuing medical therapy to restore bone mass, or have additional risk factors. I have reviewed this report, and agree with the above findings. Mark A. Thornton Papas, M.D. [facility] Electronically Signed   By: Lavonia Dana M.D.   On: 10/09/2015 15:40   Mm Screening Breast Tomo Bilateral  10/09/2015  CLINICAL DATA:  Screening. EXAM: 2D DIGITAL SCREENING BILATERAL MAMMOGRAM WITH CAD AND ADJUNCT TOMO COMPARISON:  Previous exam(s). ACR Breast Density Category b: There are scattered areas of fibroglandular density. FINDINGS: In the left breast, a possible asymmetry warrants further evaluation. In the right breast, no findings suspicious for malignancy. Images were processed with CAD. IMPRESSION: Further evaluation is suggested for possible asymmetry in the left breast. RECOMMENDATION: Diagnostic mammogram and possibly ultrasound of the left breast. (Code:[id number]) The patient will be contacted regarding the findings, and additional imaging will be scheduled. BI-RADS CATEGORY  0: Incomplete. Need additional imaging evaluation and/or prior mammograms for comparison. Electronically Signed   By: Abelardo Diesel M.D.   On: 10/09/2015 16:09    ASSESSMENT AND PLAN: This is a very pleasant 60 years old white female with stage IIIB non-small cell lung cancer currently undergoing a course of concurrent chemoradiation with weekly carboplatin and paclitaxel is status post 7 cycles and tolerating her treatment fairly well except for the radiation induced esophagitis as well as dry cough. The patient is here today to start the first  cycle of consolidation chemotherapy with carboplatin and Alimta. She is feeling fine today. We will proceed with the first cycle as a scheduled. The patient would come back for follow-up visit in 3 weeks for reevaluation before starting cycle #2 of her treatment. She was advised to call immediately if she has any concerning symptoms in the interval. The patient voices understanding of current disease status and treatment options and is in agreement with the current care plan.  All questions were answered. The patient knows to call the clinic with any problems, questions or concerns. We can certainly see the patient much sooner if necessary.  Disclaimer: This note was dictated with voice recognition software. Similar sounding words can inadvertently be transcribed and may not be corrected upon  review.

## 2015-11-02 NOTE — Patient Instructions (Signed)
[facility] Discharge Instructions for Patients Receiving Chemotherapy  Today you received the following chemotherapy agents Alimta and Carboplatin.  To help prevent nausea and vomiting after your treatment, we encourage you to take your nausea medication as prescribed.   If you develop nausea and vomiting that is not controlled by your nausea medication, call the clinic.   BELOW ARE SYMPTOMS THAT SHOULD BE REPORTED IMMEDIATELY:  *FEVER GREATER THAN 100.5 F  *CHILLS WITH OR WITHOUT FEVER  NAUSEA AND VOMITING THAT IS NOT CONTROLLED WITH YOUR NAUSEA MEDICATION  *UNUSUAL SHORTNESS OF BREATH  *UNUSUAL BRUISING OR BLEEDING  TENDERNESS IN MOUTH AND THROAT WITH OR WITHOUT PRESENCE OF ULCERS  *URINARY PROBLEMS  *BOWEL PROBLEMS  UNUSUAL RASH Items with * indicate a potential emergency and should be followed up as soon as possible.  Feel free to call the clinic you have any questions or concerns. The clinic phone number is [phone].  Please show the CHEMO ALERT CARD at check-in to the Emergency Department and triage nurse.   

## 2015-11-03 ENCOUNTER — Other Ambulatory Visit: Payer: Self-pay

## 2015-11-03 ENCOUNTER — Telehealth: Payer: Self-pay | Admitting: Internal Medicine

## 2015-11-03 ENCOUNTER — Telehealth: Payer: Self-pay | Admitting: Medical Oncology

## 2015-11-03 DIAGNOSIS — R928 Other abnormal and inconclusive findings on diagnostic imaging of breast: Secondary | ICD-10-CM

## 2015-11-03 NOTE — Telephone Encounter (Signed)
"   I felt a little wired yesterday . I am doing well". She said [facility] denied filling folic acid. [facility] and [facility] can [facility]ill folic acid tomorrow.

## 2015-11-03 NOTE — Telephone Encounter (Signed)
Faxed pt medical records to [facility] [phone]

## 2015-11-04 ENCOUNTER — Other Ambulatory Visit: Payer: Self-pay | Admitting: Physician Assistant

## 2015-11-04 DIAGNOSIS — R928 Other abnormal and inconclusive findings on diagnostic imaging of breast: Secondary | ICD-10-CM

## 2015-11-05 ENCOUNTER — Ambulatory Visit
Admission: RE | Admit: 2015-11-05 | Discharge: 2015-11-05 | Disposition: A | Discharge status: Home / Self Care | Payer: Managed Care, Other (non HMO) | Source: Ambulatory Visit | Attending: Internal Medicine | Admitting: Internal Medicine

## 2015-11-05 ENCOUNTER — Telehealth: Payer: Self-pay | Admitting: *Deleted

## 2015-11-05 DIAGNOSIS — R928 Other abnormal and inconclusive findings on diagnostic imaging of breast: Secondary | ICD-10-CM

## 2015-11-05 NOTE — Telephone Encounter (Signed)
Patient called stating that she is constipated. She is drinking and taking stool softeners. Advised that patient may take Miralax. She verbalized understanding.

## 2015-11-09 ENCOUNTER — Ambulatory Visit (HOSPITAL_BASED_OUTPATIENT_CLINIC_OR_DEPARTMENT_OTHER): Payer: Managed Care, Other (non HMO)

## 2015-11-09 ENCOUNTER — Other Ambulatory Visit (HOSPITAL_BASED_OUTPATIENT_CLINIC_OR_DEPARTMENT_OTHER): Payer: Managed Care, Other (non HMO)

## 2015-11-09 DIAGNOSIS — Z95828 Presence of other vascular implants and grafts: Secondary | ICD-10-CM

## 2015-11-09 DIAGNOSIS — C3411 Malignant neoplasm of upper lobe, right bronchus or lung: Secondary | ICD-10-CM

## 2015-11-09 LAB — COMPREHENSIVE METABOLIC PANEL
ALT: 25 U/L (ref 0–55)
ANION GAP: 7 meq/L (ref 3–11)
AST: 24 U/L (ref 5–34)
Albumin: 3.4 g/dL — ABNORMAL LOW (ref 3.5–5.0)
Alkaline Phosphatase: 52 U/L (ref 40–150)
BUN: 15.3 mg/dL (ref 7.0–26.0)
CALCIUM: 9.5 mg/dL (ref 8.4–10.4)
CO2: 28 mEq/L (ref 22–29)
CREATININE: 0.7 mg/dL (ref 0.6–1.1)
Chloride: 105 mEq/L (ref 98–109)
EGFR: 90 mL/min/{1.73_m2} — ABNORMAL LOW (ref >90)
GLUCOSE: 98 mg/dL (ref 70–140)
Potassium: 4.2 mEq/L (ref 3.5–5.1)
Sodium: 140 mEq/L (ref 136–145)
TOTAL PROTEIN: 6 g/dL — AB (ref 6.4–8.3)
Total Bilirubin: 0.31 mg/dL (ref 0.20–1.20)

## 2015-11-09 LAB — CBC WITH DIFFERENTIAL/PLATELET
BASO%: 0.9 % (ref 0.0–2.0)
Basophils Absolute: 0 10*3/uL (ref 0.0–0.1)
EOS%: 5.9 % (ref 0.0–7.0)
Eosinophils Absolute: 0.2 10*3/uL (ref 0.0–0.5)
HEMATOCRIT: 37.5 % (ref 34.8–46.6)
HGB: 12.4 g/dL (ref 11.6–15.9)
LYMPH#: 0.9 10*3/uL (ref 0.9–3.3)
LYMPH%: 35.1 % (ref 14.0–49.7)
MCH: 30.4 pg (ref 25.1–34.0)
MCHC: 33.2 g/dL (ref 31.5–36.0)
MCV: 91.7 fL (ref 79.5–101.0)
MONO#: 0.3 10*3/uL (ref 0.1–0.9)
MONO%: 11.9 % (ref 0.0–14.0)
NEUT%: 46.2 % (ref 38.4–76.8)
NEUTROS ABS: 1.2 10*3/uL — AB (ref 1.5–6.5)
PLATELETS: 188 10*3/uL (ref 145–400)
RBC: 4.08 10*6/uL (ref 3.70–5.45)
RDW: 14 % (ref 11.2–14.5)
WBC: 2.6 10*3/uL — ABNORMAL LOW (ref 3.9–10.3)

## 2015-11-09 MED ORDER — SODIUM CHLORIDE 0.9% FLUSH
10.0000 mL | INTRAVENOUS | Status: DC | PRN
  Administered 2015-11-09: 10 mL via INTRAVENOUS
  Filled 2015-11-09: qty 10

## 2015-11-09 MED ORDER — HEPARIN SOD (PORK) LOCK FLUSH 100 UNIT/ML IV SOLN
500.0000 [IU] | Freq: Once | INTRAVENOUS | Status: AC
  Administered 2015-11-09: 500 [IU] via INTRAVENOUS
  Filled 2015-11-09: qty 5

## 2015-11-09 NOTE — Patient Instructions (Signed)

## 2015-11-16 ENCOUNTER — Ambulatory Visit: Payer: Managed Care, Other (non HMO)

## 2015-11-16 ENCOUNTER — Other Ambulatory Visit (HOSPITAL_BASED_OUTPATIENT_CLINIC_OR_DEPARTMENT_OTHER): Payer: Managed Care, Other (non HMO)

## 2015-11-16 ENCOUNTER — Other Ambulatory Visit: Payer: Self-pay | Admitting: *Deleted

## 2015-11-16 ENCOUNTER — Telehealth: Payer: Self-pay | Admitting: *Deleted

## 2015-11-16 VITALS — BP 123/84 | HR 90 | Temp 98.1°F | Resp 16

## 2015-11-16 DIAGNOSIS — C3411 Malignant neoplasm of upper lobe, right bronchus or lung: Secondary | ICD-10-CM | POA: Diagnosis not present

## 2015-11-16 DIAGNOSIS — Z5111 Encounter for antineoplastic chemotherapy: Secondary | ICD-10-CM

## 2015-11-16 DIAGNOSIS — C3491 Malignant neoplasm of unspecified part of right bronchus or lung: Secondary | ICD-10-CM

## 2015-11-16 DIAGNOSIS — Z95828 Presence of other vascular implants and grafts: Secondary | ICD-10-CM

## 2015-11-16 DIAGNOSIS — R918 Other nonspecific abnormal finding of lung field: Secondary | ICD-10-CM

## 2015-11-16 LAB — COMPREHENSIVE METABOLIC PANEL
ALBUMIN: 3.5 g/dL (ref 3.5–5.0)
ALK PHOS: 54 U/L (ref 40–150)
ALT: 35 U/L (ref 0–55)
AST: 31 U/L (ref 5–34)
Anion Gap: 7 mEq/L (ref 3–11)
BILIRUBIN TOTAL: 0.3 mg/dL (ref 0.20–1.20)
BUN: 16.7 mg/dL (ref 7.0–26.0)
CALCIUM: 9.8 mg/dL (ref 8.4–10.4)
CO2: 25 mEq/L (ref 22–29)
Chloride: 110 mEq/L — ABNORMAL HIGH (ref 98–109)
Creatinine: 0.7 mg/dL (ref 0.6–1.1)
GLUCOSE: 92 mg/dL (ref 70–140)
POTASSIUM: 3.9 meq/L (ref 3.5–5.1)
SODIUM: 142 meq/L (ref 136–145)
TOTAL PROTEIN: 6.4 g/dL (ref 6.4–8.3)

## 2015-11-16 LAB — CBC WITH DIFFERENTIAL/PLATELET
BASO%: 0.4 % (ref 0.0–2.0)
BASOS ABS: 0 10*3/uL (ref 0.0–0.1)
EOS ABS: 0.1 10*3/uL (ref 0.0–0.5)
EOS%: 2.3 % (ref 0.0–7.0)
HEMATOCRIT: 38.8 % (ref 34.8–46.6)
HEMOGLOBIN: 12.6 g/dL (ref 11.6–15.9)
LYMPH%: 19.6 % (ref 14.0–49.7)
MCH: 30.2 pg (ref 25.1–34.0)
MCHC: 32.5 g/dL (ref 31.5–36.0)
MCV: 92.8 fL (ref 79.5–101.0)
MONO#: 0.4 10*3/uL (ref 0.1–0.9)
MONO%: 12.1 % (ref 0.0–14.0)
NEUT%: 65.6 % (ref 38.4–76.8)
NEUTROS ABS: 2.3 10*3/uL (ref 1.5–6.5)
Platelets: 245 10*3/uL (ref 145–400)
RBC: 4.19 10*6/uL (ref 3.70–5.45)
RDW: 14.2 % (ref 11.2–14.5)
WBC: 3.4 10*3/uL — AB (ref 3.9–10.3)
lymph#: 0.7 10*3/uL — ABNORMAL LOW (ref 0.9–3.3)

## 2015-11-16 MED ORDER — HEPARIN SOD (PORK) LOCK FLUSH 100 UNIT/ML IV SOLN
500.0000 [IU] | Freq: Once | INTRAVENOUS | Status: AC
  Administered 2015-11-16: 500 [IU] via INTRAVENOUS
  Filled 2015-11-16: qty 5

## 2015-11-16 MED ORDER — SODIUM CHLORIDE 0.9% FLUSH
10.0000 mL | INTRAVENOUS | Status: DC | PRN
  Administered 2015-11-16: 10 mL via INTRAVENOUS
  Filled 2015-11-16: qty 10

## 2015-11-16 NOTE — Telephone Encounter (Signed)
Every 9 weeks

## 2015-11-16 NOTE — Telephone Encounter (Signed)
Patient received B-12 injection 3/20. Patient is currently on Alimta. Patient was told she would be receiving B-12 injections monthly. Is this correct? Message forwarded to MD Julien Nordmann and [place]

## 2015-11-16 NOTE — Patient Instructions (Signed)

## 2015-11-16 NOTE — Telephone Encounter (Signed)
The B-12 order is in with the chemotherapy orders so she can get this injection in infusion. No need to send POF for individual injection appointment

## 2015-11-16 NOTE — Telephone Encounter (Signed)
Needs b 12 order

## 2015-11-20 ENCOUNTER — Other Ambulatory Visit: Payer: Self-pay

## 2015-11-20 DIAGNOSIS — Z95828 Presence of other vascular implants and grafts: Secondary | ICD-10-CM | POA: Insufficient documentation

## 2015-11-20 HISTORY — DX: Presence of other vascular implants and grafts: Z95.828

## 2015-11-23 ENCOUNTER — Encounter: Payer: Self-pay | Admitting: Internal Medicine

## 2015-11-23 ENCOUNTER — Ambulatory Visit (HOSPITAL_BASED_OUTPATIENT_CLINIC_OR_DEPARTMENT_OTHER): Payer: Managed Care, Other (non HMO) | Admitting: Internal Medicine

## 2015-11-23 ENCOUNTER — Telehealth: Payer: Self-pay | Admitting: Internal Medicine

## 2015-11-23 ENCOUNTER — Other Ambulatory Visit (HOSPITAL_BASED_OUTPATIENT_CLINIC_OR_DEPARTMENT_OTHER): Payer: Managed Care, Other (non HMO)

## 2015-11-23 ENCOUNTER — Ambulatory Visit (HOSPITAL_BASED_OUTPATIENT_CLINIC_OR_DEPARTMENT_OTHER): Payer: Managed Care, Other (non HMO)

## 2015-11-23 VITALS — BP 129/82 | HR 85 | Temp 97.9°F | Resp 18 | Ht 63.0 in | Wt 141.7 lb

## 2015-11-23 DIAGNOSIS — Z5111 Encounter for antineoplastic chemotherapy: Secondary | ICD-10-CM | POA: Diagnosis not present

## 2015-11-23 DIAGNOSIS — C3411 Malignant neoplasm of upper lobe, right bronchus or lung: Secondary | ICD-10-CM | POA: Diagnosis not present

## 2015-11-23 DIAGNOSIS — K208 Other esophagitis: Secondary | ICD-10-CM | POA: Diagnosis not present

## 2015-11-23 LAB — CBC WITH DIFFERENTIAL/PLATELET
BASO%: 0.3 % (ref 0.0–2.0)
BASOS ABS: 0 10*3/uL (ref 0.0–0.1)
EOS ABS: 0 10*3/uL (ref 0.0–0.5)
EOS%: 0.1 % (ref 0.0–7.0)
HEMATOCRIT: 36 % (ref 34.8–46.6)
HEMOGLOBIN: 11.9 g/dL (ref 11.6–15.9)
LYMPH#: 0.8 10*3/uL — AB (ref 0.9–3.3)
LYMPH%: 11.9 % — ABNORMAL LOW (ref 14.0–49.7)
MCH: 30.5 pg (ref 25.1–34.0)
MCHC: 33.1 g/dL (ref 31.5–36.0)
MCV: 92 fL (ref 79.5–101.0)
MONO#: 0.6 10*3/uL (ref 0.1–0.9)
MONO%: 8.6 % (ref 0.0–14.0)
NEUT%: 79.1 % — ABNORMAL HIGH (ref 38.4–76.8)
NEUTROS ABS: 5.3 10*3/uL (ref 1.5–6.5)
Platelets: 296 10*3/uL (ref 145–400)
RBC: 3.91 10*6/uL (ref 3.70–5.45)
RDW: 15.3 % — AB (ref 11.2–14.5)
WBC: 6.7 10*3/uL (ref 3.9–10.3)

## 2015-11-23 LAB — COMPREHENSIVE METABOLIC PANEL
ALT: 20 U/L (ref 0–55)
AST: 18 U/L (ref 5–34)
Albumin: 3.7 g/dL (ref 3.5–5.0)
Alkaline Phosphatase: 49 U/L (ref 40–150)
Anion Gap: 6 mEq/L (ref 3–11)
BUN: 16.2 mg/dL (ref 7.0–26.0)
CO2: 26 meq/L (ref 22–29)
CREATININE: 0.8 mg/dL (ref 0.6–1.1)
Calcium: 9.4 mg/dL (ref 8.4–10.4)
Chloride: 108 mEq/L (ref 98–109)
EGFR: 87 mL/min/{1.73_m2} — ABNORMAL LOW (ref >90)
Glucose: 103 mg/dl (ref 70–140)
Potassium: 4 mEq/L (ref 3.5–5.1)
Sodium: 140 mEq/L (ref 136–145)
Total Bilirubin: <0.3 mg/dL (ref 0.20–1.20)
Total Protein: 6.3 g/dL — ABNORMAL LOW (ref 6.4–8.3)

## 2015-11-23 MED ORDER — SODIUM CHLORIDE 0.9% FLUSH
10.0000 mL | INTRAVENOUS | Status: DC | PRN
  Administered 2015-11-23: 10 mL
  Filled 2015-11-23: qty 10

## 2015-11-23 MED ORDER — PALONOSETRON HCL INJECTION 0.25 MG/5ML
0.2500 mg | Freq: Once | INTRAVENOUS | Status: AC
  Administered 2015-11-23: 0.25 mg via INTRAVENOUS

## 2015-11-23 MED ORDER — HEPARIN SOD (PORK) LOCK FLUSH 100 UNIT/ML IV SOLN
500.0000 [IU] | Freq: Once | INTRAVENOUS | Status: AC | PRN
  Administered 2015-11-23: 500 [IU]
  Filled 2015-11-23: qty 5

## 2015-11-23 MED ORDER — SODIUM CHLORIDE 0.9 % IV SOLN
502.0000 mg | Freq: Once | INTRAVENOUS | Status: AC
  Administered 2015-11-23: 500 mg via INTRAVENOUS
  Filled 2015-11-23: qty 50

## 2015-11-23 MED ORDER — SODIUM CHLORIDE 0.9 % IV SOLN
Freq: Once | INTRAVENOUS | Status: AC
  Administered 2015-11-23: 12:00:00 via INTRAVENOUS

## 2015-11-23 MED ORDER — SODIUM CHLORIDE 0.9 % IV SOLN
485.0000 mg/m2 | Freq: Once | INTRAVENOUS | Status: AC
  Administered 2015-11-23: 800 mg via INTRAVENOUS
  Filled 2015-11-23: qty 28

## 2015-11-23 MED ORDER — DEXAMETHASONE SODIUM PHOSPHATE 100 MG/10ML IJ SOLN
10.0000 mg | Freq: Once | INTRAMUSCULAR | Status: AC
  Administered 2015-11-23: 10 mg via INTRAVENOUS
  Filled 2015-11-23: qty 1

## 2015-11-23 MED ORDER — PALONOSETRON HCL INJECTION 0.25 MG/5ML
INTRAVENOUS | Status: AC
  Filled 2015-11-23: qty 5

## 2015-11-23 NOTE — Patient Instructions (Signed)
[facility] Discharge Instructions for Patients Receiving Chemotherapy  Today you received the following chemotherapy agents Alimta and Carboplatin.  To help prevent nausea and vomiting after your treatment, we encourage you to take your nausea medication as prescribed.   If you develop nausea and vomiting that is not controlled by your nausea medication, call the clinic.   BELOW ARE SYMPTOMS THAT SHOULD BE REPORTED IMMEDIATELY:  *FEVER GREATER THAN 100.5 F  *CHILLS WITH OR WITHOUT FEVER  NAUSEA AND VOMITING THAT IS NOT CONTROLLED WITH YOUR NAUSEA MEDICATION  *UNUSUAL SHORTNESS OF BREATH  *UNUSUAL BRUISING OR BLEEDING  TENDERNESS IN MOUTH AND THROAT WITH OR WITHOUT PRESENCE OF ULCERS  *URINARY PROBLEMS  *BOWEL PROBLEMS  UNUSUAL RASH Items with * indicate a potential emergency and should be followed up as soon as possible.  Feel free to call the clinic you have any questions or concerns. The clinic phone number is [phone].  Please show the CHEMO ALERT CARD at check-in to the Emergency Department and triage nurse.   

## 2015-11-23 NOTE — Progress Notes (Signed)
.      [place] [phone]   [phone]  OFFICE PROGRESS NOTE  Rose Figueroa, [place] [address]  DIAGNOSIS: Stage IIIB (T2a, N3, M0) non-small cell lung cancer, adenocarcinoma with negative EGFR mutation and negative ALK gene translocation diagnosed in October 2016.  PRIOR THERAPY: Concurrent chemoradiation with weekly carboplatin for AUC of 2 and paclitaxel 45 MG/M2 status post 6 cycles. Last dose was given 07/13/2015 with partial response.  CURRENT THERAPY: Systemic chemotherapy with carboplatin for AUC of 5 and Alimta 500 MG/M2 every 3 weeks. First dose 11/02/2015.  INTERVAL HISTORY: Rose Figueroa 60 y.o. female returns to the clinic today for follow-up visit accompanied by her husband. The patient related the first cycle of her treatment well except for some mouth sores as well as constipation for a few days after the treatment. She also had flulike symptoms during that time. She is feeling much better today. She denied having any significant weight loss or night sweats. She denied having any chest pain, shortness of breath, cough or hemoptysis. She has no significant nausea or vomiting, no fever or chills. She is here today to start cycle #2 of her systemic chemotherapy.  MEDICAL HISTORY: Past Medical History  Diagnosis Date  . Osteoporosis   . H/O cold sores   . Diverticulosis   . Rectal bleeding   . BCC (basal cell carcinoma of skin)   . right upper lobe lung mass 04/15/2015  . Back pain   . Colon polyps   . Complication of anesthesia     pt. states that she is difficult to arouse  . PONV (postoperative nausea and vomiting)   . Dysrhythmia   . Anxiety   . Complication of anesthesia   . History of anemia     ALLERGIES:  has No Known Allergies.  MEDICATIONS:  Current Outpatient Prescriptions  Medication Sig Dispense Refill  . albuterol (PROVENTIL HFA;VENTOLIN HFA) 108 (90 Base) MCG/ACT inhaler Inhale  1 puff into the lungs every 6 (six) hours as needed for wheezing or shortness of breath.    . Biotin 5000 MCG CAPS Take 5,000 mcg by mouth daily.    . Calcium Carb-Cholecalciferol (CALCIUM 1000 + D PO) Take 5 tablets by mouth daily. D3 = 400 IU per each.    . cetirizine (ZYRTEC) 10 MG tablet Take 10 mg by mouth daily.    Marland Kitchen dexamethasone (DECADRON) 4 MG tablet 4 mg by mouth twice a day the day before, day of and day after the chemotherapy every 3 weeks 40 tablet 1  . folic acid (FOLVITE) 1 MG tablet Take 1 tablet (1 mg total) by mouth daily. 30 tablet 4  . L-LYSINE PO Take 3,000 mg by mouth daily.     Marland Kitchen lidocaine-prilocaine (EMLA) cream Apply 1 application topically as needed. 30 g 0  . Melatonin 3 MG CAPS Take 3 mg by mouth once.    . Misc Natural Products (TART CHERRY ADVANCED PO) Take 1,200 mg by mouth daily.    . Multiple Vitamins-Minerals (CENTRUM SILVER PO) Take 1 tablet by mouth daily.    . Omega-3 Fatty Acids (RA FISH OIL) 1400 MG CPDR Take 2 capsules by mouth daily.     . Turmeric Curcumin 500 MG CAPS Take 1 each by mouth daily.     . vitamin B-12 (CYANOCOBALAMIN) 1000 MCG tablet Take 1,000 mcg by mouth daily.     No current facility-administered medications for this visit.  SURGICAL HISTORY:  Past Surgical History  Procedure Laterality Date  . Cystectomy      right breast x 2  . Colonoscopy    . Colonoscopy w/ polypectomy    . Video bronchoscopy with endobronchial ultrasound N/A 05/07/2015    Procedure: VIDEO BRONCHOSCOPY WITH ENDOBRONCHIAL ULTRASOUND;  Surgeon: Ivin Poot, MD;  Location: [facility];  Service: Thoracic;  Laterality: N/A;  . Mediastinoscopy N/A 05/07/2015    Procedure: MEDIASTINOSCOPY;  Surgeon: Ivin Poot, MD;  Location: [facility];  Service: Thoracic;  Laterality: N/A;    REVIEW OF SYSTEMS:  A comprehensive review of systems was negative except for: Constitutional: positive for fatigue   PHYSICAL EXAMINATION: General appearance: alert, cooperative,  fatigued and no distress Head: Normocephalic, without obvious abnormality, atraumatic Neck: no adenopathy, no JVD, supple, symmetrical, trachea midline and thyroid not enlarged, symmetric, no tenderness/mass/nodules Lymph nodes: Cervical, supraclavicular, and axillary nodes normal. Resp: clear to auscultation bilaterally Back: symmetric, no curvature. ROM normal. No CVA tenderness. Cardio: regular rate and rhythm, S1, S2 normal, no murmur, click, rub or gallop GI: soft, non-tender; bowel sounds normal; no masses,  no organomegaly Extremities: extremities normal, atraumatic, no cyanosis or edema Neurologic: Alert and oriented X 3, normal strength and tone. Normal symmetric reflexes. Normal coordination and gait  ECOG PERFORMANCE STATUS: 1 - Symptomatic but completely ambulatory  Blood pressure 129/82, pulse 85, temperature 97.9 F (36.6 C), temperature source Oral, resp. rate 18, height 5' 3"  (1.6 m), weight 141 lb 11.2 oz (64.275 kg), SpO2 100 %.  LABORATORY DATA: Lab Results  Component Value Date   WBC 3.4* 11/16/2015   HGB 12.6 11/16/2015   HCT 38.8 11/16/2015   MCV 92.8 11/16/2015   PLT 245 11/16/2015      Chemistry      Component Value Date/Time   NA 142 11/16/2015 1039   NA 139 07/03/2015 1200   K 3.9 11/16/2015 1039   K 4.3 07/03/2015 1200   CL 107 07/03/2015 1200   CO2 25 11/16/2015 1039   CO2 26 07/03/2015 1200   BUN 16.7 11/16/2015 1039   BUN 15 07/03/2015 1200   CREATININE 0.7 11/16/2015 1039   CREATININE 0.56 07/03/2015 1200      Component Value Date/Time   CALCIUM 9.8 11/16/2015 1039   CALCIUM 9.3 07/03/2015 1200   ALKPHOS 54 11/16/2015 1039   ALKPHOS 35* 04/30/2015 1336   AST 31 11/16/2015 1039   AST 27 04/30/2015 1336   ALT 35 11/16/2015 1039   ALT 24 04/30/2015 1336   BILITOT 0.30 11/16/2015 1039   BILITOT 0.6 04/30/2015 1336       RADIOGRAPHIC STUDIES: Mm Diag Breast Tomo Uni Left  11/05/2015  CLINICAL DATA:  Possible asymmetry in the upper  outer left breast on a recent screening mammogram. Currently undergoing chemotherapy for lung cancer. EXAM: 2D DIGITAL DIAGNOSTIC UNILATERAL LEFT MAMMOGRAM WITH CAD AND ADJUNCT TOMO COMPARISON:  Previous exam(s). ACR Breast Density Category b: There are scattered areas of fibroglandular density. FINDINGS: 2D and 3D spot compression views of the left breast demonstrate normal appearing fibroglandular tissue at the location of the recently suspected asymmetry in the upper outer quadrant, posteriorly. Mammographic images were processed with CAD. IMPRESSION: No evidence of malignancy. The recently suspected left breast asymmetry represented close apposition of normal breast tissue. RECOMMENDATION: Bilateral screening mammogram in 1 year. I have discussed the findings and recommendations with the patient. Results were also provided in writing at the conclusion of the visit. If applicable, a reminder  letter will be sent to the patient regarding the next appointment. BI-RADS CATEGORY  1: Negative. Electronically Signed   By: Claudie Revering M.D.   On: 11/05/2015 10:32    ASSESSMENT AND PLAN: This is a very pleasant 60 years old white female with stage IIIB non-small cell lung cancer currently undergoing a course of concurrent chemoradiation with weekly carboplatin and paclitaxel is status post 7 cycles and tolerating her treatment fairly well except for the radiation induced esophagitis as well as dry cough. She is currently undergoing systemic chemotherapy with carboplatin and Alimta status post 1 cycle and tolerating the treatment well. I recommended for her to proceed with cycle #2 today as a scheduled. She will come back for follow-up visit in 3 weeks for evaluation before starting cycle #3. She was advised to call immediately if she has any concerning symptoms in the interval. The patient voices understanding of current disease status and treatment options and is in agreement with the current care plan.  All  questions were answered. The patient knows to call the clinic with any problems, questions or concerns. We can certainly see the patient much sooner if necessary.  Disclaimer: This note was dictated with voice recognition software. Similar sounding words can inadvertently be transcribed and may not be corrected upon review.

## 2015-11-23 NOTE — Telephone Encounter (Signed)
per pof to sch pt appt-pt sch already made out

## 2015-11-30 ENCOUNTER — Other Ambulatory Visit (HOSPITAL_BASED_OUTPATIENT_CLINIC_OR_DEPARTMENT_OTHER): Payer: Managed Care, Other (non HMO)

## 2015-11-30 ENCOUNTER — Ambulatory Visit (HOSPITAL_BASED_OUTPATIENT_CLINIC_OR_DEPARTMENT_OTHER): Payer: Managed Care, Other (non HMO)

## 2015-11-30 DIAGNOSIS — C3411 Malignant neoplasm of upper lobe, right bronchus or lung: Secondary | ICD-10-CM

## 2015-11-30 DIAGNOSIS — Z95828 Presence of other vascular implants and grafts: Secondary | ICD-10-CM

## 2015-11-30 LAB — CBC WITH DIFFERENTIAL/PLATELET
BASO%: 0.9 % (ref 0.0–2.0)
BASOS ABS: 0 10*3/uL (ref 0.0–0.1)
EOS ABS: 0.1 10*3/uL (ref 0.0–0.5)
EOS%: 2.4 % (ref 0.0–7.0)
HCT: 37 % (ref 34.8–46.6)
HGB: 12.2 g/dL (ref 11.6–15.9)
LYMPH%: 34.3 % (ref 14.0–49.7)
MCH: 30.2 pg (ref 25.1–34.0)
MCHC: 33 g/dL (ref 31.5–36.0)
MCV: 91.7 fL (ref 79.5–101.0)
MONO#: 0.4 10*3/uL (ref 0.1–0.9)
MONO%: 14.3 % — ABNORMAL HIGH (ref 0.0–14.0)
NEUT%: 48.1 % (ref 38.4–76.8)
NEUTROS ABS: 1.3 10*3/uL — AB (ref 1.5–6.5)
PLATELETS: 210 10*3/uL (ref 145–400)
RBC: 4.03 10*6/uL (ref 3.70–5.45)
RDW: 15.4 % — ABNORMAL HIGH (ref 11.2–14.5)
WBC: 2.6 10*3/uL — ABNORMAL LOW (ref 3.9–10.3)
lymph#: 0.9 10*3/uL (ref 0.9–3.3)

## 2015-11-30 LAB — COMPREHENSIVE METABOLIC PANEL
ALT: 32 U/L (ref 0–55)
ANION GAP: 6 meq/L (ref 3–11)
AST: 23 U/L (ref 5–34)
Albumin: 3.5 g/dL (ref 3.5–5.0)
Alkaline Phosphatase: 49 U/L (ref 40–150)
BUN: 17.5 mg/dL (ref 7.0–26.0)
CALCIUM: 9.3 mg/dL (ref 8.4–10.4)
CHLORIDE: 106 meq/L (ref 98–109)
CO2: 28 mEq/L (ref 22–29)
CREATININE: 0.7 mg/dL (ref 0.6–1.1)
Glucose: 94 mg/dl (ref 70–140)
Potassium: 4.3 mEq/L (ref 3.5–5.1)
Sodium: 140 mEq/L (ref 136–145)
Total Bilirubin: 0.3 mg/dL (ref 0.20–1.20)
Total Protein: 6.1 g/dL — ABNORMAL LOW (ref 6.4–8.3)

## 2015-11-30 MED ORDER — HEPARIN SOD (PORK) LOCK FLUSH 100 UNIT/ML IV SOLN
500.0000 [IU] | Freq: Once | INTRAVENOUS | Status: AC | PRN
  Administered 2015-11-30: 500 [IU] via INTRAVENOUS
  Filled 2015-11-30: qty 5

## 2015-11-30 MED ORDER — SODIUM CHLORIDE 0.9 % IJ SOLN
10.0000 mL | INTRAMUSCULAR | Status: DC | PRN
Start: 2015-11-30 — End: 2015-11-30
  Administered 2015-11-30: 10 mL via INTRAVENOUS
  Filled 2015-11-30: qty 10

## 2015-11-30 NOTE — Patient Instructions (Signed)

## 2015-12-07 ENCOUNTER — Ambulatory Visit (HOSPITAL_BASED_OUTPATIENT_CLINIC_OR_DEPARTMENT_OTHER): Payer: Managed Care, Other (non HMO)

## 2015-12-07 ENCOUNTER — Other Ambulatory Visit (HOSPITAL_BASED_OUTPATIENT_CLINIC_OR_DEPARTMENT_OTHER): Payer: Managed Care, Other (non HMO)

## 2015-12-07 DIAGNOSIS — Z95828 Presence of other vascular implants and grafts: Secondary | ICD-10-CM

## 2015-12-07 DIAGNOSIS — C3411 Malignant neoplasm of upper lobe, right bronchus or lung: Secondary | ICD-10-CM | POA: Diagnosis not present

## 2015-12-07 LAB — CBC WITH DIFFERENTIAL/PLATELET
BASO%: 0.4 % (ref 0.0–2.0)
BASOS ABS: 0 10*3/uL (ref 0.0–0.1)
EOS%: 0.7 % (ref 0.0–7.0)
Eosinophils Absolute: 0 10*3/uL (ref 0.0–0.5)
HEMATOCRIT: 34.6 % — AB (ref 34.8–46.6)
HGB: 11.6 g/dL (ref 11.6–15.9)
LYMPH#: 0.9 10*3/uL (ref 0.9–3.3)
LYMPH%: 31.6 % (ref 14.0–49.7)
MCH: 30.3 pg (ref 25.1–34.0)
MCHC: 33.5 g/dL (ref 31.5–36.0)
MCV: 90.3 fL (ref 79.5–101.0)
MONO#: 0.4 10*3/uL (ref 0.1–0.9)
MONO%: 15.6 % — ABNORMAL HIGH (ref 0.0–14.0)
NEUT#: 1.5 10*3/uL (ref 1.5–6.5)
NEUT%: 51.7 % (ref 38.4–76.8)
Platelets: 171 10*3/uL (ref 145–400)
RBC: 3.83 10*6/uL (ref 3.70–5.45)
RDW: 15.7 % — ABNORMAL HIGH (ref 11.2–14.5)
WBC: 2.8 10*3/uL — ABNORMAL LOW (ref 3.9–10.3)

## 2015-12-07 LAB — COMPREHENSIVE METABOLIC PANEL
ALT: 25 U/L (ref 0–55)
AST: 22 U/L (ref 5–34)
Albumin: 3.6 g/dL (ref 3.5–5.0)
Alkaline Phosphatase: 45 U/L (ref 40–150)
Anion Gap: 6 mEq/L (ref 3–11)
BUN: 19.6 mg/dL (ref 7.0–26.0)
CALCIUM: 9.7 mg/dL (ref 8.4–10.4)
CHLORIDE: 108 meq/L (ref 98–109)
CO2: 27 mEq/L (ref 22–29)
Creatinine: 0.8 mg/dL (ref 0.6–1.1)
EGFR: 85 mL/min/{1.73_m2} — AB (ref >90)
Glucose: 92 mg/dl (ref 70–140)
POTASSIUM: 3.9 meq/L (ref 3.5–5.1)
Sodium: 141 mEq/L (ref 136–145)
Total Bilirubin: <0.3 mg/dL (ref 0.20–1.20)
Total Protein: 6.4 g/dL (ref 6.4–8.3)

## 2015-12-07 MED ORDER — SODIUM CHLORIDE 0.9 % IJ SOLN
10.0000 mL | INTRAMUSCULAR | Status: DC | PRN
  Administered 2015-12-07: 10 mL via INTRAVENOUS
  Filled 2015-12-07: qty 10

## 2015-12-07 MED ORDER — HEPARIN SOD (PORK) LOCK FLUSH 100 UNIT/ML IV SOLN
500.0000 [IU] | Freq: Once | INTRAVENOUS | Status: AC | PRN
  Administered 2015-12-07: 500 [IU] via INTRAVENOUS
  Filled 2015-12-07: qty 5

## 2015-12-07 NOTE — Patient Instructions (Signed)

## 2015-12-11 ENCOUNTER — Other Ambulatory Visit: Payer: Self-pay | Admitting: Medical Oncology

## 2015-12-14 ENCOUNTER — Ambulatory Visit: Payer: Managed Care, Other (non HMO)

## 2015-12-14 ENCOUNTER — Other Ambulatory Visit (HOSPITAL_BASED_OUTPATIENT_CLINIC_OR_DEPARTMENT_OTHER): Payer: Managed Care, Other (non HMO)

## 2015-12-14 ENCOUNTER — Encounter: Payer: Self-pay | Admitting: Internal Medicine

## 2015-12-14 ENCOUNTER — Telehealth: Payer: Self-pay | Admitting: Internal Medicine

## 2015-12-14 ENCOUNTER — Ambulatory Visit (HOSPITAL_BASED_OUTPATIENT_CLINIC_OR_DEPARTMENT_OTHER): Payer: Managed Care, Other (non HMO)

## 2015-12-14 ENCOUNTER — Ambulatory Visit (HOSPITAL_BASED_OUTPATIENT_CLINIC_OR_DEPARTMENT_OTHER): Payer: Managed Care, Other (non HMO) | Admitting: Internal Medicine

## 2015-12-14 ENCOUNTER — Encounter: Payer: Self-pay | Admitting: *Deleted

## 2015-12-14 VITALS — BP 137/86 | HR 77 | Temp 98.1°F | Resp 18 | Ht 63.0 in | Wt 140.2 lb

## 2015-12-14 DIAGNOSIS — C3411 Malignant neoplasm of upper lobe, right bronchus or lung: Secondary | ICD-10-CM

## 2015-12-14 DIAGNOSIS — K209 Esophagitis, unspecified: Secondary | ICD-10-CM | POA: Diagnosis not present

## 2015-12-14 DIAGNOSIS — Z5111 Encounter for antineoplastic chemotherapy: Secondary | ICD-10-CM | POA: Diagnosis not present

## 2015-12-14 DIAGNOSIS — M810 Age-related osteoporosis without current pathological fracture: Secondary | ICD-10-CM | POA: Diagnosis not present

## 2015-12-14 DIAGNOSIS — Z95828 Presence of other vascular implants and grafts: Secondary | ICD-10-CM

## 2015-12-14 LAB — COMPREHENSIVE METABOLIC PANEL
ALBUMIN: 3.8 g/dL (ref 3.5–5.0)
ALK PHOS: 46 U/L (ref 40–150)
ALT: 21 U/L (ref 0–55)
ANION GAP: 8 meq/L (ref 3–11)
AST: 18 U/L (ref 5–34)
BUN: 19.6 mg/dL (ref 7.0–26.0)
CALCIUM: 10.1 mg/dL (ref 8.4–10.4)
CO2: 26 mEq/L (ref 22–29)
Chloride: 106 mEq/L (ref 98–109)
Creatinine: 0.8 mg/dL (ref 0.6–1.1)
EGFR: 84 mL/min/{1.73_m2} — ABNORMAL LOW (ref >90)
Glucose: 110 mg/dl (ref 70–140)
POTASSIUM: 4.1 meq/L (ref 3.5–5.1)
SODIUM: 140 meq/L (ref 136–145)
Total Bilirubin: 0.31 mg/dL (ref 0.20–1.20)
Total Protein: 6.7 g/dL (ref 6.4–8.3)

## 2015-12-14 LAB — CBC WITH DIFFERENTIAL/PLATELET
BASO%: 0.2 % (ref 0.0–2.0)
BASOS ABS: 0 10*3/uL (ref 0.0–0.1)
EOS ABS: 0 10*3/uL (ref 0.0–0.5)
EOS%: 0 % (ref 0.0–7.0)
HCT: 33.4 % — ABNORMAL LOW (ref 34.8–46.6)
HEMOGLOBIN: 11.5 g/dL — AB (ref 11.6–15.9)
LYMPH%: 10.2 % — ABNORMAL LOW (ref 14.0–49.7)
MCH: 31.1 pg (ref 25.1–34.0)
MCHC: 34.4 g/dL (ref 31.5–36.0)
MCV: 90.3 fL (ref 79.5–101.0)
MONO#: 0.5 10*3/uL (ref 0.1–0.9)
MONO%: 8 % (ref 0.0–14.0)
NEUT#: 5 10*3/uL (ref 1.5–6.5)
NEUT%: 81.6 % — ABNORMAL HIGH (ref 38.4–76.8)
PLATELETS: 268 10*3/uL (ref 145–400)
RBC: 3.7 10*6/uL (ref 3.70–5.45)
RDW: 16.3 % — AB (ref 11.2–14.5)
WBC: 6.2 10*3/uL (ref 3.9–10.3)
lymph#: 0.6 10*3/uL — ABNORMAL LOW (ref 0.9–3.3)

## 2015-12-14 MED ORDER — SODIUM CHLORIDE 0.9% FLUSH
10.0000 mL | INTRAVENOUS | Status: DC | PRN
  Administered 2015-12-14: 10 mL
  Filled 2015-12-14: qty 10

## 2015-12-14 MED ORDER — CYANOCOBALAMIN 1000 MCG/ML IJ SOLN
INTRAMUSCULAR | Status: AC
  Filled 2015-12-14: qty 1

## 2015-12-14 MED ORDER — HEPARIN SOD (PORK) LOCK FLUSH 100 UNIT/ML IV SOLN
500.0000 [IU] | Freq: Once | INTRAVENOUS | Status: AC | PRN
  Administered 2015-12-14: 500 [IU]
  Filled 2015-12-14: qty 5

## 2015-12-14 MED ORDER — PALONOSETRON HCL INJECTION 0.25 MG/5ML
INTRAVENOUS | Status: AC
  Filled 2015-12-14: qty 5

## 2015-12-14 MED ORDER — CYANOCOBALAMIN 1000 MCG/ML IJ SOLN
1000.0000 ug | Freq: Once | INTRAMUSCULAR | Status: AC
  Administered 2015-12-14: 1000 ug via INTRAMUSCULAR

## 2015-12-14 MED ORDER — SODIUM CHLORIDE 0.9 % IJ SOLN
10.0000 mL | INTRAMUSCULAR | Status: DC | PRN
  Administered 2015-12-14: 10 mL via INTRAVENOUS
  Filled 2015-12-14: qty 10

## 2015-12-14 MED ORDER — DEXAMETHASONE SODIUM PHOSPHATE 100 MG/10ML IJ SOLN
10.0000 mg | Freq: Once | INTRAMUSCULAR | Status: AC
  Administered 2015-12-14: 10 mg via INTRAVENOUS
  Filled 2015-12-14: qty 1

## 2015-12-14 MED ORDER — SODIUM CHLORIDE 0.9 % IV SOLN
485.0000 mg/m2 | Freq: Once | INTRAVENOUS | Status: AC
  Administered 2015-12-14: 800 mg via INTRAVENOUS
  Filled 2015-12-14: qty 28

## 2015-12-14 MED ORDER — SODIUM CHLORIDE 0.9 % IV SOLN
497.5000 mg | Freq: Once | INTRAVENOUS | Status: AC
  Administered 2015-12-14: 500 mg via INTRAVENOUS
  Filled 2015-12-14: qty 50

## 2015-12-14 MED ORDER — PALONOSETRON HCL INJECTION 0.25 MG/5ML
0.2500 mg | Freq: Once | INTRAVENOUS | Status: AC
  Administered 2015-12-14: 0.25 mg via INTRAVENOUS

## 2015-12-14 MED ORDER — SODIUM CHLORIDE 0.9 % IV SOLN
Freq: Once | INTRAVENOUS | Status: AC
  Administered 2015-12-14: 13:00:00 via INTRAVENOUS

## 2015-12-14 NOTE — Patient Instructions (Signed)
[facility] Discharge Instructions for Patients Receiving Chemotherapy  Today you received the following chemotherapy agents:  Carboplatin, Alimta  To help prevent nausea and vomiting after your treatment, we encourage you to take your nausea medication as prescribed.   If you develop nausea and vomiting that is not controlled by your nausea medication, call the clinic.   BELOW ARE SYMPTOMS THAT SHOULD BE REPORTED IMMEDIATELY:  *FEVER GREATER THAN 100.5 F  *CHILLS WITH OR WITHOUT FEVER  NAUSEA AND VOMITING THAT IS NOT CONTROLLED WITH YOUR NAUSEA MEDICATION  *UNUSUAL SHORTNESS OF BREATH  *UNUSUAL BRUISING OR BLEEDING  TENDERNESS IN MOUTH AND THROAT WITH OR WITHOUT PRESENCE OF ULCERS  *URINARY PROBLEMS  *BOWEL PROBLEMS  UNUSUAL RASH Items with * indicate a potential emergency and should be followed up as soon as possible.  Feel free to call the clinic you have any questions or concerns. The clinic phone number is [phone].  Please show the [place] at check-in to the Emergency Department and triage nurse.

## 2015-12-14 NOTE — Progress Notes (Signed)
Oncology Nurse Navigator Documentation  Oncology Nurse Navigator Flowsheets 12/14/2015  Navigator Location [facility]  Navigator Encounter Type Clinic/MDC  Patient Visit Type MedOnc  Treatment Phase Treatment  Barriers/Navigation Needs Coordination of Care  Interventions Coordination of Care  Acuity Level 2  Acuity Level 2 Other   Spoke with patient and her husband today.  Patient needs help with scheduling. I will follow up with scheduling team.

## 2015-12-14 NOTE — Telephone Encounter (Signed)
Gave and printed appt sched and avs fo rpt for May thru July

## 2015-12-14 NOTE — Progress Notes (Signed)
.      [place] [phone]   [phone]) [phone]  OFFICE PROGRESS NOTE  Rose Figueroa, [place] [address]  DIAGNOSIS: Stage IIIB (T2a, N3, M0) non-small cell lung cancer, adenocarcinoma with negative EGFR mutation and negative ALK gene translocation diagnosed in October 2016.  PRIOR THERAPY: Concurrent chemoradiation with weekly carboplatin for AUC of 2 and paclitaxel 45 MG/M2 status post 6 cycles. Last dose was given 07/13/2015 with partial response.  CURRENT THERAPY: Systemic chemotherapy with carboplatin for AUC of 5 and Alimta 500 MG/M2 every 3 weeks. First dose 11/02/2015. Status post 2 cycles.  INTERVAL HISTORY: Rose Figueroa 59 y.o. female returns to the clinic today for follow-up visit accompanied by her husband. The patient is currently on systemic chemotherapy with carboplatin and Alimta and tolerating it fairly well was no significant adverse effect except for some watery eyes after the treatment. She is feeling much better today. She denied having any significant weight loss or night sweats. She denied having any chest pain, shortness of breath, cough or hemoptysis. She has no significant nausea or vomiting, no fever or chills. She is here today to start cycle #3 of her systemic chemotherapy.  MEDICAL HISTORY: Past Medical History  Diagnosis Date  . Osteoporosis   . H/O cold sores   . Diverticulosis   . Rectal bleeding   . BCC (basal cell carcinoma of skin)   . right upper lobe lung mass 04/15/2015  . Back pain   . Colon polyps   . Complication of anesthesia     pt. states that she is difficult to arouse  . PONV (postoperative nausea and vomiting)   . Dysrhythmia   . Anxiety   . Complication of anesthesia   . History of anemia     ALLERGIES:  has No Known Allergies.  MEDICATIONS:  Current Outpatient Prescriptions  Medication Sig Dispense Refill  . albuterol (PROVENTIL HFA;VENTOLIN HFA) 108 (90 Base)  MCG/ACT inhaler Inhale 1 puff into the lungs every 6 (six) hours as needed for wheezing or shortness of breath.    . benzonatate (TESSALON) 100 MG capsule     . Biotin 5000 MCG CAPS Take 5,000 mcg by mouth daily.    . Calcium Carb-Cholecalciferol (CALCIUM 1000 + D PO) Take 5 tablets by mouth daily. D3 = 400 IU per each.    . cetirizine (ZYRTEC) 10 MG tablet Take 10 mg by mouth daily.    Marland Kitchen dexamethasone (DECADRON) 4 MG tablet 4 mg by mouth twice a day the day before, day of and day after the chemotherapy every 3 weeks 40 tablet 1  . folic acid (FOLVITE) 1 MG tablet Take 1 tablet (1 mg total) by mouth daily. 30 tablet 4  . L-LYSINE PO Take 3,000 mg by mouth daily.     Marland Kitchen lidocaine-prilocaine (EMLA) cream Apply 1 application topically as needed. 30 g 0  . Melatonin 3 MG CAPS Take 3 mg by mouth once.    . Misc Natural Products (TART CHERRY ADVANCED PO) Take 1,200 mg by mouth daily.    . Multiple Vitamins-Minerals (CENTRUM SILVER PO) Take 1 tablet by mouth daily.    . Omega-3 Fatty Acids (RA FISH OIL) 1400 MG CPDR Take 2 capsules by mouth daily.     . prochlorperazine (COMPAZINE) 10 MG tablet Take 10 mg by mouth every 6 (six) hours as needed for nausea or vomiting.    . Turmeric Curcumin 500 MG CAPS Take 1 each  by mouth daily.     . vitamin B-12 (CYANOCOBALAMIN) 1000 MCG tablet Take 1,000 mcg by mouth daily.     No current facility-administered medications for this visit.    SURGICAL HISTORY:  Past Surgical History  Procedure Laterality Date  . Cystectomy      right breast x 2  . Colonoscopy    . Colonoscopy w/ polypectomy    . Video bronchoscopy with endobronchial ultrasound N/A 05/07/2015    Procedure: VIDEO BRONCHOSCOPY WITH ENDOBRONCHIAL ULTRASOUND;  Surgeon: Ivin Poot, MD;  Location: [facility];  Service: Thoracic;  Laterality: N/A;  . Mediastinoscopy N/A 05/07/2015    Procedure: MEDIASTINOSCOPY;  Surgeon: Ivin Poot, MD;  Location: [facility];  Service: Thoracic;  Laterality: N/A;     REVIEW OF SYSTEMS:  A comprehensive review of systems was negative except for: Constitutional: positive for fatigue Eyes: positive for Increased tears   PHYSICAL EXAMINATION: General appearance: alert, cooperative, fatigued and no distress Head: Normocephalic, without obvious abnormality, atraumatic Neck: no adenopathy, no JVD, supple, symmetrical, trachea midline and thyroid not enlarged, symmetric, no tenderness/mass/nodules Lymph nodes: Cervical, supraclavicular, and axillary nodes normal. Resp: clear to auscultation bilaterally Back: symmetric, no curvature. ROM normal. No CVA tenderness. Cardio: regular rate and rhythm, S1, S2 normal, no murmur, click, rub or gallop GI: soft, non-tender; bowel sounds normal; no masses,  no organomegaly Extremities: extremities normal, atraumatic, no cyanosis or edema Neurologic: Alert and oriented X 3, normal strength and tone. Normal symmetric reflexes. Normal coordination and gait  ECOG PERFORMANCE STATUS: 1 - Symptomatic but completely ambulatory  Blood pressure 137/86, pulse 77, temperature 98.1 F (36.7 C), temperature source Oral, resp. rate 18, height 5' 3"  (1.6 m), weight 140 lb 3.2 oz (63.594 kg), SpO2 100 %.  LABORATORY DATA: Lab Results  Component Value Date   WBC 6.2 12/14/2015   HGB 11.5* 12/14/2015   HCT 33.4* 12/14/2015   MCV 90.3 12/14/2015   PLT 268 12/14/2015      Chemistry      Component Value Date/Time   NA 141 12/07/2015 1041   NA 139 07/03/2015 1200   K 3.9 12/07/2015 1041   K 4.3 07/03/2015 1200   CL 107 07/03/2015 1200   CO2 27 12/07/2015 1041   CO2 26 07/03/2015 1200   BUN 19.6 12/07/2015 1041   BUN 15 07/03/2015 1200   CREATININE 0.8 12/07/2015 1041   CREATININE 0.56 07/03/2015 1200      Component Value Date/Time   CALCIUM 9.7 12/07/2015 1041   CALCIUM 9.3 07/03/2015 1200   ALKPHOS 45 12/07/2015 1041   ALKPHOS 35* 04/30/2015 1336   AST 22 12/07/2015 1041   AST 27 04/30/2015 1336   ALT 25  12/07/2015 1041   ALT 24 04/30/2015 1336   BILITOT <0.30 12/07/2015 1041   BILITOT 0.6 04/30/2015 1336       RADIOGRAPHIC STUDIES: No results found.  ASSESSMENT AND PLAN: This is a very pleasant 60 years old white female with stage IIIB non-small cell lung cancer currently undergoing a course of concurrent chemoradiation with weekly carboplatin and paclitaxel is status post 7 cycles and tolerating her treatment fairly well except for the radiation induced esophagitis as well as dry cough. She is currently undergoing systemic chemotherapy with carboplatin and Alimta status post 2 cycles and tolerating the treatment well. I recommended for her to proceed with cycle #3 today as a scheduled. She will come back for follow-up visit in 3 weeks for evaluation before starting cycle #4 after  repeating CT scan of the chest for restaging of her disease. The increased lacrimation is most likely secondary to her systemic chemotherapy in addition to seasonal allergy and the patient was advised to continue taking Zyrtec as needed. She was advised to call immediately if she has any concerning symptoms in the interval. The patient voices understanding of current disease status and treatment options and is in agreement with the current care plan.  All questions were answered. The patient knows to call the clinic with any problems, questions or concerns. We can certainly see the patient much sooner if necessary.  Disclaimer: This note was dictated with voice recognition software. Similar sounding words can inadvertently be transcribed and may not be corrected upon review.

## 2015-12-14 NOTE — Patient Instructions (Signed)

## 2015-12-22 ENCOUNTER — Telehealth: Payer: Self-pay | Admitting: *Deleted

## 2015-12-22 ENCOUNTER — Ambulatory Visit (HOSPITAL_BASED_OUTPATIENT_CLINIC_OR_DEPARTMENT_OTHER): Payer: Managed Care, Other (non HMO)

## 2015-12-22 ENCOUNTER — Other Ambulatory Visit (HOSPITAL_BASED_OUTPATIENT_CLINIC_OR_DEPARTMENT_OTHER): Payer: Managed Care, Other (non HMO)

## 2015-12-22 DIAGNOSIS — C3411 Malignant neoplasm of upper lobe, right bronchus or lung: Secondary | ICD-10-CM | POA: Diagnosis not present

## 2015-12-22 DIAGNOSIS — R918 Other nonspecific abnormal finding of lung field: Secondary | ICD-10-CM

## 2015-12-22 DIAGNOSIS — Z95828 Presence of other vascular implants and grafts: Secondary | ICD-10-CM

## 2015-12-22 DIAGNOSIS — Z5111 Encounter for antineoplastic chemotherapy: Secondary | ICD-10-CM

## 2015-12-22 DIAGNOSIS — C3491 Malignant neoplasm of unspecified part of right bronchus or lung: Secondary | ICD-10-CM

## 2015-12-22 LAB — CBC WITH DIFFERENTIAL/PLATELET
BASO%: 0.4 % (ref 0.0–2.0)
BASOS ABS: 0 10*3/uL (ref 0.0–0.1)
EOS ABS: 0.1 10*3/uL (ref 0.0–0.5)
EOS%: 1.7 % (ref 0.0–7.0)
HCT: 33.6 % — ABNORMAL LOW (ref 34.8–46.6)
HEMOGLOBIN: 11.2 g/dL — AB (ref 11.6–15.9)
LYMPH%: 20.7 % (ref 14.0–49.7)
MCH: 30.9 pg (ref 25.1–34.0)
MCHC: 33.4 g/dL (ref 31.5–36.0)
MCV: 92.6 fL (ref 79.5–101.0)
MONO#: 0.5 10*3/uL (ref 0.1–0.9)
MONO%: 12.1 % (ref 0.0–14.0)
NEUT#: 2.7 10*3/uL (ref 1.5–6.5)
NEUT%: 65.1 % (ref 38.4–76.8)
Platelets: 198 10*3/uL (ref 145–400)
RBC: 3.63 10*6/uL — AB (ref 3.70–5.45)
RDW: 17.1 % — ABNORMAL HIGH (ref 11.2–14.5)
WBC: 4.2 10*3/uL (ref 3.9–10.3)
lymph#: 0.9 10*3/uL (ref 0.9–3.3)

## 2015-12-22 LAB — COMPREHENSIVE METABOLIC PANEL
ALBUMIN: 3.4 g/dL — AB (ref 3.5–5.0)
ALK PHOS: 49 U/L (ref 40–150)
ALT: 38 U/L (ref 0–55)
ANION GAP: 5 meq/L (ref 3–11)
AST: 29 U/L (ref 5–34)
BUN: 17.7 mg/dL (ref 7.0–26.0)
CO2: 28 mEq/L (ref 22–29)
Calcium: 9.1 mg/dL (ref 8.4–10.4)
Chloride: 107 mEq/L (ref 98–109)
Creatinine: 0.8 mg/dL (ref 0.6–1.1)
EGFR: 83 mL/min/{1.73_m2} — AB (ref >90)
GLUCOSE: 101 mg/dL (ref 70–140)
POTASSIUM: 4.3 meq/L (ref 3.5–5.1)
SODIUM: 141 meq/L (ref 136–145)
Total Bilirubin: <0.3 mg/dL (ref 0.20–1.20)
Total Protein: 6 g/dL — ABNORMAL LOW (ref 6.4–8.3)

## 2015-12-22 MED ORDER — ALTEPLASE 2 MG IJ SOLR
2.0000 mg | Freq: Once | INTRAMUSCULAR | Status: DC | PRN
  Filled 2015-12-22: qty 2

## 2015-12-22 MED ORDER — HEPARIN SOD (PORK) LOCK FLUSH 100 UNIT/ML IV SOLN
500.0000 [IU] | Freq: Once | INTRAVENOUS | Status: AC | PRN
  Administered 2015-12-22: 500 [IU] via INTRAVENOUS
  Filled 2015-12-22: qty 5

## 2015-12-22 MED ORDER — SODIUM CHLORIDE 0.9 % IJ SOLN
10.0000 mL | INTRAMUSCULAR | Status: DC | PRN
  Administered 2015-12-22: 10 mL via INTRAVENOUS
  Filled 2015-12-22: qty 10

## 2015-12-22 NOTE — Telephone Encounter (Signed)
Oncology Nurse Navigator Documentation  Oncology Nurse Navigator Flowsheets 12/22/2015  Navigator Location [facility]  Navigator Encounter Type Clinic/MDC  Patient Visit Type MedOnc  Treatment Phase Treatment  Barriers/Navigation Needs Education  Education Other  Acuity Level 1  Time Spent with Patient 15   I spoke with Rose Figueroa today at the cancer center.  She was complaining of some nausea.  I updated Dr. Julien Nordmann.  He asked that I follow up with her to make sure she is taking her anti-nausea and if that is not working to let him know.  I called and left a vm message for Rose Figueroa to call with my name and phone number

## 2015-12-22 NOTE — Patient Instructions (Signed)

## 2015-12-28 ENCOUNTER — Telehealth: Payer: Self-pay | Admitting: Medical Oncology

## 2015-12-28 ENCOUNTER — Other Ambulatory Visit (HOSPITAL_BASED_OUTPATIENT_CLINIC_OR_DEPARTMENT_OTHER): Payer: Managed Care, Other (non HMO)

## 2015-12-28 ENCOUNTER — Ambulatory Visit (HOSPITAL_BASED_OUTPATIENT_CLINIC_OR_DEPARTMENT_OTHER): Payer: Managed Care, Other (non HMO)

## 2015-12-28 DIAGNOSIS — C3411 Malignant neoplasm of upper lobe, right bronchus or lung: Secondary | ICD-10-CM | POA: Diagnosis not present

## 2015-12-28 DIAGNOSIS — Z95828 Presence of other vascular implants and grafts: Secondary | ICD-10-CM

## 2015-12-28 LAB — CBC WITH DIFFERENTIAL/PLATELET
BASO%: 0.4 % (ref 0.0–2.0)
BASOS ABS: 0 10*3/uL (ref 0.0–0.1)
EOS ABS: 0 10*3/uL (ref 0.0–0.5)
EOS%: 1.1 % (ref 0.0–7.0)
HEMATOCRIT: 35.4 % (ref 34.8–46.6)
HEMOGLOBIN: 11.7 g/dL (ref 11.6–15.9)
LYMPH#: 0.8 10*3/uL — AB (ref 0.9–3.3)
LYMPH%: 23.8 % (ref 14.0–49.7)
MCH: 30.9 pg (ref 25.1–34.0)
MCHC: 33 g/dL (ref 31.5–36.0)
MCV: 93.9 fL (ref 79.5–101.0)
MONO#: 0.5 10*3/uL (ref 0.1–0.9)
MONO%: 14.5 % — AB (ref 0.0–14.0)
NEUT%: 60.2 % (ref 38.4–76.8)
NEUTROS ABS: 1.9 10*3/uL (ref 1.5–6.5)
Platelets: 210 10*3/uL (ref 145–400)
RBC: 3.77 10*6/uL (ref 3.70–5.45)
RDW: 17.9 % — AB (ref 11.2–14.5)
WBC: 3.2 10*3/uL — AB (ref 3.9–10.3)

## 2015-12-28 LAB — COMPREHENSIVE METABOLIC PANEL
ALBUMIN: 3.6 g/dL (ref 3.5–5.0)
ALK PHOS: 47 U/L (ref 40–150)
ALT: 44 U/L (ref 0–55)
AST: 28 U/L (ref 5–34)
Anion Gap: 6 mEq/L (ref 3–11)
BUN: 24 mg/dL (ref 7.0–26.0)
CALCIUM: 9.4 mg/dL (ref 8.4–10.4)
CO2: 27 mEq/L (ref 22–29)
Chloride: 110 mEq/L — ABNORMAL HIGH (ref 98–109)
Creatinine: 0.8 mg/dL (ref 0.6–1.1)
EGFR: 78 mL/min/{1.73_m2} — AB (ref >90)
GLUCOSE: 91 mg/dL (ref 70–140)
Potassium: 4.4 mEq/L (ref 3.5–5.1)
SODIUM: 143 meq/L (ref 136–145)
TOTAL PROTEIN: 6.4 g/dL (ref 6.4–8.3)

## 2015-12-28 MED ORDER — HEPARIN SOD (PORK) LOCK FLUSH 100 UNIT/ML IV SOLN
500.0000 [IU] | Freq: Once | INTRAVENOUS | Status: AC | PRN
Start: 2015-12-28 — End: 2015-12-28
  Administered 2015-12-28: 500 [IU] via INTRAVENOUS
  Filled 2015-12-28: qty 5

## 2015-12-28 MED ORDER — SODIUM CHLORIDE 0.9 % IJ SOLN
10.0000 mL | INTRAMUSCULAR | Status: DC | PRN
  Administered 2015-12-28: 10 mL via INTRAVENOUS
  Filled 2015-12-28: qty 10

## 2015-12-28 NOTE — Telephone Encounter (Signed)
Pt requests CT be done at [facility] in [place]-please fax order to [phone]

## 2015-12-29 ENCOUNTER — Other Ambulatory Visit: Payer: Self-pay | Admitting: Medical Oncology

## 2015-12-29 DIAGNOSIS — C3411 Malignant neoplasm of upper lobe, right bronchus or lung: Secondary | ICD-10-CM

## 2015-12-29 NOTE — Progress Notes (Signed)
Demographics faxed to [facility] for Ct scan. Note to Darlena to fax auth number to [facility].

## 2015-12-29 NOTE — Telephone Encounter (Signed)
CT order faxed to [facility] and received.

## 2016-01-01 ENCOUNTER — Ambulatory Visit (HOSPITAL_COMMUNITY): Payer: No Typology Code available for payment source

## 2016-01-04 ENCOUNTER — Encounter: Payer: Self-pay | Admitting: Internal Medicine

## 2016-01-04 ENCOUNTER — Other Ambulatory Visit (HOSPITAL_BASED_OUTPATIENT_CLINIC_OR_DEPARTMENT_OTHER): Payer: Managed Care, Other (non HMO)

## 2016-01-04 ENCOUNTER — Ambulatory Visit (HOSPITAL_BASED_OUTPATIENT_CLINIC_OR_DEPARTMENT_OTHER): Payer: Managed Care, Other (non HMO) | Admitting: Internal Medicine

## 2016-01-04 ENCOUNTER — Ambulatory Visit (HOSPITAL_BASED_OUTPATIENT_CLINIC_OR_DEPARTMENT_OTHER): Payer: Managed Care, Other (non HMO)

## 2016-01-04 ENCOUNTER — Ambulatory Visit: Payer: Managed Care, Other (non HMO)

## 2016-01-04 VITALS — BP 137/93 | HR 82 | Temp 98.0°F | Resp 19 | Wt 141.0 lb

## 2016-01-04 DIAGNOSIS — M810 Age-related osteoporosis without current pathological fracture: Secondary | ICD-10-CM

## 2016-01-04 DIAGNOSIS — Z5111 Encounter for antineoplastic chemotherapy: Secondary | ICD-10-CM | POA: Diagnosis not present

## 2016-01-04 DIAGNOSIS — C3411 Malignant neoplasm of upper lobe, right bronchus or lung: Secondary | ICD-10-CM

## 2016-01-04 DIAGNOSIS — Z95828 Presence of other vascular implants and grafts: Secondary | ICD-10-CM

## 2016-01-04 LAB — CBC WITH DIFFERENTIAL/PLATELET
BASO%: 0.1 % (ref 0.0–2.0)
BASOS ABS: 0 10*3/uL (ref 0.0–0.1)
EOS%: 0 % (ref 0.0–7.0)
Eosinophils Absolute: 0 10*3/uL (ref 0.0–0.5)
HEMATOCRIT: 34 % — AB (ref 34.8–46.6)
HGB: 11.2 g/dL — ABNORMAL LOW (ref 11.6–15.9)
LYMPH#: 0.4 10*3/uL — AB (ref 0.9–3.3)
LYMPH%: 7.8 % — ABNORMAL LOW (ref 14.0–49.7)
MCH: 31.1 pg (ref 25.1–34.0)
MCHC: 32.9 g/dL (ref 31.5–36.0)
MCV: 94.5 fL (ref 79.5–101.0)
MONO#: 0.3 10*3/uL (ref 0.1–0.9)
MONO%: 6.3 % (ref 0.0–14.0)
NEUT#: 4.6 10*3/uL (ref 1.5–6.5)
NEUT%: 85.8 % — AB (ref 38.4–76.8)
PLATELETS: 274 10*3/uL (ref 145–400)
RBC: 3.59 10*6/uL — ABNORMAL LOW (ref 3.70–5.45)
RDW: 18.6 % — AB (ref 11.2–14.5)
WBC: 5.4 10*3/uL (ref 3.9–10.3)

## 2016-01-04 LAB — COMPREHENSIVE METABOLIC PANEL
ALT: 27 U/L (ref 0–55)
ANION GAP: 7 meq/L (ref 3–11)
AST: 20 U/L (ref 5–34)
Albumin: 3.8 g/dL (ref 3.5–5.0)
Alkaline Phosphatase: 51 U/L (ref 40–150)
BUN: 19.8 mg/dL (ref 7.0–26.0)
CALCIUM: 9.5 mg/dL (ref 8.4–10.4)
CHLORIDE: 108 meq/L (ref 98–109)
CO2: 24 mEq/L (ref 22–29)
Creatinine: 0.8 mg/dL (ref 0.6–1.1)
EGFR: 81 mL/min/{1.73_m2} — AB (ref >90)
Glucose: 113 mg/dl (ref 70–140)
POTASSIUM: 4.2 meq/L (ref 3.5–5.1)
Sodium: 139 mEq/L (ref 136–145)
Total Bilirubin: <0.3 mg/dL (ref 0.20–1.20)
Total Protein: 6.7 g/dL (ref 6.4–8.3)

## 2016-01-04 MED ORDER — PALONOSETRON HCL INJECTION 0.25 MG/5ML
INTRAVENOUS | Status: AC
  Filled 2016-01-04: qty 5

## 2016-01-04 MED ORDER — SODIUM CHLORIDE 0.9 % IJ SOLN
10.0000 mL | INTRAMUSCULAR | Status: DC | PRN
  Administered 2016-01-04: 10 mL via INTRAVENOUS
  Filled 2016-01-04: qty 10

## 2016-01-04 MED ORDER — SODIUM CHLORIDE 0.9 % IV SOLN
485.0000 mg/m2 | Freq: Once | INTRAVENOUS | Status: AC
  Administered 2016-01-04: 800 mg via INTRAVENOUS
  Filled 2016-01-04: qty 20

## 2016-01-04 MED ORDER — SODIUM CHLORIDE 0.9 % IV SOLN
497.5000 mg | Freq: Once | INTRAVENOUS | Status: AC
  Administered 2016-01-04: 500 mg via INTRAVENOUS
  Filled 2016-01-04: qty 50

## 2016-01-04 MED ORDER — SODIUM CHLORIDE 0.9 % IV SOLN
Freq: Once | INTRAVENOUS | Status: AC
  Administered 2016-01-04: 14:00:00 via INTRAVENOUS

## 2016-01-04 MED ORDER — HEPARIN SOD (PORK) LOCK FLUSH 100 UNIT/ML IV SOLN
500.0000 [IU] | Freq: Once | INTRAVENOUS | Status: AC | PRN
  Administered 2016-01-04: 500 [IU]
  Filled 2016-01-04: qty 5

## 2016-01-04 MED ORDER — SODIUM CHLORIDE 0.9% FLUSH
10.0000 mL | INTRAVENOUS | Status: DC | PRN
  Administered 2016-01-04: 10 mL
  Filled 2016-01-04: qty 10

## 2016-01-04 MED ORDER — PALONOSETRON HCL INJECTION 0.25 MG/5ML
0.2500 mg | Freq: Once | INTRAVENOUS | Status: AC
  Administered 2016-01-04: 0.25 mg via INTRAVENOUS

## 2016-01-04 MED ORDER — SODIUM CHLORIDE 0.9 % IV SOLN
10.0000 mg | Freq: Once | INTRAVENOUS | Status: AC
  Administered 2016-01-04: 10 mg via INTRAVENOUS
  Filled 2016-01-04: qty 1

## 2016-01-04 NOTE — Progress Notes (Signed)
.      [facility] Telephone:([phone]) [phone]   Fax:(336) [phone]  OFFICE PROGRESS NOTE  Rose Figueroa, [place] [address]  DIAGNOSIS: Stage IIIB (T2a, N3, M0) non-small cell lung cancer, adenocarcinoma with negative EGFR mutation and negative ALK gene translocation diagnosed in October 2016.  PRIOR THERAPY: Concurrent chemoradiation with weekly carboplatin for AUC of 2 and paclitaxel 45 MG/M2 status post 6 cycles. Last dose was given 07/13/2015 with partial response.  CURRENT THERAPY: Systemic chemotherapy with carboplatin for AUC of 5 and Alimta 500 MG/M2 every 3 weeks. First dose 11/02/2015. Status post 3 cycles.  INTERVAL HISTORY: Rose Figueroa 60 y.o. female returns to the clinic today for follow-up visit accompanied by her husband. The patient is currently on systemic chemotherapy with carboplatin and Alimta and tolerating it fairly well with no significant adverse effects. She denied having any significant weight loss or night sweats. She denied having any chest pain, shortness of breath, cough or hemoptysis. She has no significant nausea or vomiting, no fever or chills. She had repeat CT scan of the chest performed recently on 12/31/2015 and she is here for evaluation and discussion of her scan results.  MEDICAL HISTORY: Past Medical History  Diagnosis Date  . Osteoporosis   . H/O cold sores   . Diverticulosis   . Rectal bleeding   . BCC (basal cell carcinoma of skin)   . right upper lobe lung mass 04/15/2015  . Back pain   . Colon polyps   . Complication of anesthesia     pt. states that she is difficult to arouse  . PONV (postoperative nausea and vomiting)   . Dysrhythmia   . Anxiety   . Complication of anesthesia   . History of anemia     ALLERGIES:  has No Known Allergies.  MEDICATIONS:  Current Outpatient Prescriptions  Medication Sig Dispense Refill  . albuterol (PROVENTIL HFA;VENTOLIN HFA) 108 (90 Base) MCG/ACT  inhaler Inhale 1 puff into the lungs every 6 (six) hours as needed for wheezing or shortness of breath.    . benzonatate (TESSALON) 100 MG capsule     . Biotin 5000 MCG CAPS Take 5,000 mcg by mouth daily.    . Calcium Carb-Cholecalciferol (CALCIUM 1000 + D PO) Take 5 tablets by mouth daily. D3 = 400 IU per each.    . cetirizine (ZYRTEC) 10 MG tablet Take 10 mg by mouth daily.    Marland Kitchen dexamethasone (DECADRON) 4 MG tablet 4 mg by mouth twice a day the day before, day of and day after the chemotherapy every 3 weeks 40 tablet 1  . folic acid (FOLVITE) 1 MG tablet Take 1 tablet (1 mg total) by mouth daily. 30 tablet 4  . L-LYSINE PO Take 3,000 mg by mouth daily.     Marland Kitchen lidocaine-prilocaine (EMLA) cream Apply 1 application topically as needed. 30 g 0  . Melatonin 3 MG CAPS Take 3 mg by mouth once.    . Misc Natural Products (TART CHERRY ADVANCED PO) Take 1,200 mg by mouth daily.    . Multiple Vitamins-Minerals (CENTRUM SILVER PO) Take 1 tablet by mouth daily.    . Omega-3 Fatty Acids (RA FISH OIL) 1400 MG CPDR Take 2 capsules by mouth daily.     . prochlorperazine (COMPAZINE) 10 MG tablet Take 10 mg by mouth every 6 (six) hours as needed for nausea or vomiting.    . Turmeric Curcumin 500 MG CAPS Take 1 each by mouth  daily.     . vitamin B-12 (CYANOCOBALAMIN) 1000 MCG tablet Take 1,000 mcg by mouth daily.     No current facility-administered medications for this visit.    SURGICAL HISTORY:  Past Surgical History  Procedure Laterality Date  . Cystectomy      right breast x 2  . Colonoscopy    . Colonoscopy w/ polypectomy    . Video bronchoscopy with endobronchial ultrasound N/A 05/07/2015    Procedure: VIDEO BRONCHOSCOPY WITH ENDOBRONCHIAL ULTRASOUND;  Surgeon: Ivin Poot, MD;  Location: [facility];  Service: Thoracic;  Laterality: N/A;  . Mediastinoscopy N/A 05/07/2015    Procedure: MEDIASTINOSCOPY;  Surgeon: Ivin Poot, MD;  Location: [facility];  Service: Thoracic;  Laterality: N/A;    REVIEW  OF SYSTEMS:  Constitutional: negative Eyes: negative Ears, nose, mouth, throat, and face: negative Respiratory: negative Cardiovascular: negative Gastrointestinal: negative Genitourinary:negative Integument/breast: negative Hematologic/lymphatic: negative Musculoskeletal:negative Neurological: negative Behavioral/Psych: negative Endocrine: negative Allergic/Immunologic: negative   PHYSICAL EXAMINATION: General appearance: alert, cooperative, fatigued and no distress Head: Normocephalic, without obvious abnormality, atraumatic Neck: no adenopathy, no JVD, supple, symmetrical, trachea midline and thyroid not enlarged, symmetric, no tenderness/mass/nodules Lymph nodes: Cervical, supraclavicular, and axillary nodes normal. Resp: clear to auscultation bilaterally Back: symmetric, no curvature. ROM normal. No CVA tenderness. Cardio: regular rate and rhythm, S1, S2 normal, no murmur, click, rub or gallop GI: soft, non-tender; bowel sounds normal; no masses,  no organomegaly Extremities: extremities normal, atraumatic, no cyanosis or edema Neurologic: Alert and oriented X 3, normal strength and tone. Normal symmetric reflexes. Normal coordination and gait  ECOG PERFORMANCE STATUS: 1 - Symptomatic but completely ambulatory  Blood pressure 137/93, pulse 82, temperature 98 F (36.7 C), temperature source Oral, resp. rate 19, weight 141 lb (63.957 kg), SpO2 100 %.  LABORATORY DATA: Lab Results  Component Value Date   WBC 5.4 01/04/2016   HGB 11.2* 01/04/2016   HCT 34.0* 01/04/2016   MCV 94.5 01/04/2016   PLT 274 01/04/2016      Chemistry      Component Value Date/Time   NA 143 12/28/2015 1106   NA 139 07/03/2015 1200   K 4.4 12/28/2015 1106   K 4.3 07/03/2015 1200   CL 107 07/03/2015 1200   CO2 27 12/28/2015 1106   CO2 26 07/03/2015 1200   BUN 24.0 12/28/2015 1106   BUN 15 07/03/2015 1200   CREATININE 0.8 12/28/2015 1106   CREATININE 0.56 07/03/2015 1200      Component  Value Date/Time   CALCIUM 9.4 12/28/2015 1106   CALCIUM 9.3 07/03/2015 1200   ALKPHOS 47 12/28/2015 1106   ALKPHOS 35* 04/30/2015 1336   AST 28 12/28/2015 1106   AST 27 04/30/2015 1336   ALT 44 12/28/2015 1106   ALT 24 04/30/2015 1336   BILITOT <0.30 12/28/2015 1106   BILITOT 0.6 04/30/2015 1336       RADIOGRAPHIC STUDIES:  ASSESSMENT AND PLAN: This is a very pleasant 60 years old white female with stage IIIB non-small cell lung cancer currently undergoing a course of concurrent chemoradiation with weekly carboplatin and paclitaxel is status post 7 cycles and tolerating her treatment fairly well except for the radiation induced esophagitis as well as dry cough. She is currently undergoing systemic chemotherapy with carboplatin and Alimta status post 3 cycles and tolerating the treatment well. The recent CT scan of the chest performed on 12/31/2015 showed changes of radiation therapy in the right upper lobe with retraction of the previously seen right upper lobe  mass. There may be a residual soft tissue mass along the lateral margin of the radiation changes. I discussed the scan results and showed the images to the patient today. I recommended for her to proceed with cycle #4 of her chemotherapy today as a scheduled. She will come back for follow-up visit in 3 weeks for evaluation before starting cycle #5. She was advised to call immediately if she has any concerning symptoms in the interval. The patient voices understanding of current disease status and treatment options and is in agreement with the current care plan.  All questions were answered. The patient knows to call the clinic with any problems, questions or concerns. We can certainly see the patient much sooner if necessary.  Disclaimer: This note was dictated with voice recognition software. Similar sounding words can inadvertently be transcribed and may not be corrected upon review.

## 2016-01-04 NOTE — Patient Instructions (Signed)
No Name Discharge Instructions for Patients Receiving Chemotherapy  Today you received the following chemotherapy agents Alimta/ Carboplatin  To help prevent nausea and vomiting after your treatment, we encourage you to take your nausea medicine   If you develop nausea and vomiting that is not controlled by your nausea medication, call the clinic.   BELOW ARE SYMPTOMS THAT SHOULD BE REPORTED IMMEDIATELY:  *FEVER GREATER THAN 100.5 F  *CHILLS WITH OR WITHOUT FEVER  NAUSEA AND VOMITING THAT IS NOT CONTROLLED WITH YOUR NAUSEA MEDICATION  *UNUSUAL SHORTNESS OF BREATH  *UNUSUAL BRUISING OR BLEEDING  TENDERNESS IN MOUTH AND THROAT WITH OR WITHOUT PRESENCE OF ULCERS  *URINARY PROBLEMS  *BOWEL PROBLEMS  UNUSUAL RASH Items with * indicate a potential emergency and should be followed up as soon as possible.  Feel free to call the clinic you have any questions or concerns. The clinic phone number is [phone].  Please show the [facility] at check-in to the Emergency Department and triage nurse.

## 2016-01-04 NOTE — Patient Instructions (Signed)

## 2016-01-06 ENCOUNTER — Other Ambulatory Visit: Payer: Self-pay | Admitting: Gastroenterology

## 2016-01-11 ENCOUNTER — Ambulatory Visit (HOSPITAL_BASED_OUTPATIENT_CLINIC_OR_DEPARTMENT_OTHER): Payer: Managed Care, Other (non HMO)

## 2016-01-11 ENCOUNTER — Other Ambulatory Visit (HOSPITAL_BASED_OUTPATIENT_CLINIC_OR_DEPARTMENT_OTHER): Payer: Managed Care, Other (non HMO)

## 2016-01-11 VITALS — BP 133/86 | HR 98 | Temp 98.2°F | Resp 16

## 2016-01-11 DIAGNOSIS — Z95828 Presence of other vascular implants and grafts: Secondary | ICD-10-CM

## 2016-01-11 DIAGNOSIS — C3411 Malignant neoplasm of upper lobe, right bronchus or lung: Secondary | ICD-10-CM | POA: Diagnosis not present

## 2016-01-11 LAB — COMPREHENSIVE METABOLIC PANEL
ALBUMIN: 3.5 g/dL (ref 3.5–5.0)
ALK PHOS: 52 U/L (ref 40–150)
ALT: 41 U/L (ref 0–55)
ANION GAP: 6 meq/L (ref 3–11)
AST: 29 U/L (ref 5–34)
BUN: 21.8 mg/dL (ref 7.0–26.0)
CALCIUM: 9.2 mg/dL (ref 8.4–10.4)
CO2: 28 mEq/L (ref 22–29)
Chloride: 108 mEq/L (ref 98–109)
Creatinine: 0.8 mg/dL (ref 0.6–1.1)
EGFR: 81 mL/min/{1.73_m2} — AB (ref >90)
Glucose: 100 mg/dl (ref 70–140)
POTASSIUM: 4.3 meq/L (ref 3.5–5.1)
SODIUM: 142 meq/L (ref 136–145)
Total Bilirubin: 0.35 mg/dL (ref 0.20–1.20)
Total Protein: 6.3 g/dL — ABNORMAL LOW (ref 6.4–8.3)

## 2016-01-11 LAB — CBC WITH DIFFERENTIAL/PLATELET
BASO%: 1.5 % (ref 0.0–2.0)
BASOS ABS: 0 10*3/uL (ref 0.0–0.1)
EOS ABS: 0.1 10*3/uL (ref 0.0–0.5)
EOS%: 3 % (ref 0.0–7.0)
HEMATOCRIT: 32.6 % — AB (ref 34.8–46.6)
HEMOGLOBIN: 10.9 g/dL — AB (ref 11.6–15.9)
LYMPH%: 39.2 % (ref 14.0–49.7)
MCH: 31.3 pg (ref 25.1–34.0)
MCHC: 33.4 g/dL (ref 31.5–36.0)
MCV: 93.7 fL (ref 79.5–101.0)
MONO#: 0.3 10*3/uL (ref 0.1–0.9)
MONO%: 14.6 % — AB (ref 0.0–14.0)
NEUT#: 0.8 10*3/uL — ABNORMAL LOW (ref 1.5–6.5)
NEUT%: 41.7 % (ref 38.4–76.8)
PLATELETS: 179 10*3/uL (ref 145–400)
RBC: 3.48 10*6/uL — ABNORMAL LOW (ref 3.70–5.45)
RDW: 16.5 % — AB (ref 11.2–14.5)
WBC: 2 10*3/uL — ABNORMAL LOW (ref 3.9–10.3)
lymph#: 0.8 10*3/uL — ABNORMAL LOW (ref 0.9–3.3)

## 2016-01-11 MED ORDER — HEPARIN SOD (PORK) LOCK FLUSH 100 UNIT/ML IV SOLN
500.0000 [IU] | Freq: Once | INTRAVENOUS | Status: AC | PRN
  Administered 2016-01-11: 500 [IU] via INTRAVENOUS
  Filled 2016-01-11: qty 5

## 2016-01-11 MED ORDER — SODIUM CHLORIDE 0.9 % IJ SOLN
10.0000 mL | INTRAMUSCULAR | Status: DC | PRN
  Administered 2016-01-11: 10 mL via INTRAVENOUS
  Filled 2016-01-11: qty 10

## 2016-01-11 NOTE — Patient Instructions (Signed)

## 2016-01-14 ENCOUNTER — Encounter (HOSPITAL_COMMUNITY): Payer: Self-pay | Admitting: *Deleted

## 2016-01-18 ENCOUNTER — Other Ambulatory Visit (HOSPITAL_BASED_OUTPATIENT_CLINIC_OR_DEPARTMENT_OTHER): Payer: Managed Care, Other (non HMO)

## 2016-01-18 DIAGNOSIS — C3411 Malignant neoplasm of upper lobe, right bronchus or lung: Secondary | ICD-10-CM

## 2016-01-18 DIAGNOSIS — Z5111 Encounter for antineoplastic chemotherapy: Secondary | ICD-10-CM

## 2016-01-18 DIAGNOSIS — C3491 Malignant neoplasm of unspecified part of right bronchus or lung: Secondary | ICD-10-CM

## 2016-01-18 DIAGNOSIS — R918 Other nonspecific abnormal finding of lung field: Secondary | ICD-10-CM

## 2016-01-18 LAB — COMPREHENSIVE METABOLIC PANEL
ALK PHOS: 48 U/L (ref 40–150)
ALT: 42 U/L (ref 0–55)
AST: 26 U/L (ref 5–34)
Albumin: 3.7 g/dL (ref 3.5–5.0)
Anion Gap: 8 mEq/L (ref 3–11)
BUN: 22.5 mg/dL (ref 7.0–26.0)
CALCIUM: 9.6 mg/dL (ref 8.4–10.4)
CHLORIDE: 108 meq/L (ref 98–109)
CO2: 27 meq/L (ref 22–29)
Creatinine: 1 mg/dL (ref 0.6–1.1)
EGFR: 64 mL/min/{1.73_m2} — ABNORMAL LOW (ref >90)
Glucose: 59 mg/dl — ABNORMAL LOW (ref 70–140)
POTASSIUM: 4 meq/L (ref 3.5–5.1)
SODIUM: 143 meq/L (ref 136–145)
Total Protein: 6.7 g/dL (ref 6.4–8.3)

## 2016-01-18 LAB — CBC WITH DIFFERENTIAL/PLATELET
BASO%: 0.5 % (ref 0.0–2.0)
Basophils Absolute: 0 10*3/uL (ref 0.0–0.1)
EOS%: 1.1 % (ref 0.0–7.0)
Eosinophils Absolute: 0 10*3/uL (ref 0.0–0.5)
HEMATOCRIT: 35.4 % (ref 34.8–46.6)
HGB: 11.5 g/dL — ABNORMAL LOW (ref 11.6–15.9)
LYMPH#: 0.8 10*3/uL — AB (ref 0.9–3.3)
LYMPH%: 25.2 % (ref 14.0–49.7)
MCH: 31.7 pg (ref 25.1–34.0)
MCHC: 32.6 g/dL (ref 31.5–36.0)
MCV: 97.1 fL (ref 79.5–101.0)
MONO#: 0.5 10*3/uL (ref 0.1–0.9)
MONO%: 16 % — AB (ref 0.0–14.0)
NEUT#: 1.8 10*3/uL (ref 1.5–6.5)
NEUT%: 57.2 % (ref 38.4–76.8)
Platelets: 182 10*3/uL (ref 145–400)
RBC: 3.64 10*6/uL — AB (ref 3.70–5.45)
RDW: 18.5 % — ABNORMAL HIGH (ref 11.2–14.5)
WBC: 3.2 10*3/uL — ABNORMAL LOW (ref 3.9–10.3)

## 2016-01-19 ENCOUNTER — Other Ambulatory Visit: Payer: Self-pay | Admitting: Gastroenterology

## 2016-01-20 ENCOUNTER — Encounter (HOSPITAL_COMMUNITY): Payer: Self-pay | Admitting: Certified Registered Nurse Anesthetist

## 2016-01-20 ENCOUNTER — Ambulatory Visit (HOSPITAL_COMMUNITY): Payer: Managed Care, Other (non HMO) | Admitting: Certified Registered Nurse Anesthetist

## 2016-01-20 ENCOUNTER — Ambulatory Visit (HOSPITAL_COMMUNITY)
Admission: RE | Admit: 2016-01-20 | Discharge: 2016-01-20 | Disposition: A | Discharge status: Home / Self Care | Payer: Managed Care, Other (non HMO) | Source: Ambulatory Visit | Attending: Gastroenterology | Admitting: Gastroenterology

## 2016-01-20 ENCOUNTER — Encounter (HOSPITAL_COMMUNITY)
Admission: RE | Disposition: A | Discharge status: Home / Self Care | Payer: Self-pay | Source: Ambulatory Visit | Attending: Gastroenterology

## 2016-01-20 DIAGNOSIS — Z8371 Family history of colonic polyps: Secondary | ICD-10-CM | POA: Diagnosis not present

## 2016-01-20 DIAGNOSIS — Z86010 Personal history of colonic polyps: Secondary | ICD-10-CM | POA: Diagnosis not present

## 2016-01-20 DIAGNOSIS — D123 Benign neoplasm of transverse colon: Secondary | ICD-10-CM | POA: Diagnosis not present

## 2016-01-20 DIAGNOSIS — Z1211 Encounter for screening for malignant neoplasm of colon: Secondary | ICD-10-CM | POA: Insufficient documentation

## 2016-01-20 DIAGNOSIS — D122 Benign neoplasm of ascending colon: Secondary | ICD-10-CM | POA: Diagnosis not present

## 2016-01-20 DIAGNOSIS — K648 Other hemorrhoids: Secondary | ICD-10-CM | POA: Diagnosis not present

## 2016-01-20 DIAGNOSIS — Z79899 Other long term (current) drug therapy: Secondary | ICD-10-CM | POA: Insufficient documentation

## 2016-01-20 DIAGNOSIS — Z87891 Personal history of nicotine dependence: Secondary | ICD-10-CM | POA: Diagnosis not present

## 2016-01-20 HISTORY — PX: COLONOSCOPY WITH PROPOFOL: SHX5780

## 2016-01-20 HISTORY — DX: Malignant neoplasm of unspecified part of unspecified bronchus or lung: C34.90

## 2016-01-20 SURGERY — COLONOSCOPY WITH PROPOFOL
Anesthesia: Monitor Anesthesia Care

## 2016-01-20 MED ORDER — SODIUM CHLORIDE 0.9 % IV SOLN: INTRAVENOUS | Status: DC

## 2016-01-20 MED ORDER — PROPOFOL 500 MG/50ML IV EMUL
INTRAVENOUS | Status: DC | PRN
  Administered 2016-01-20: 100 ug/kg/min via INTRAVENOUS

## 2016-01-20 MED ORDER — LACTATED RINGERS IV SOLN
INTRAVENOUS | Status: DC
  Administered 2016-01-20: 1000 mL via INTRAVENOUS

## 2016-01-20 MED ORDER — PROPOFOL 10 MG/ML IV BOLUS
INTRAVENOUS | Status: AC
  Filled 2016-01-20: qty 40

## 2016-01-20 MED ORDER — PROPOFOL 500 MG/50ML IV EMUL
INTRAVENOUS | Status: DC | PRN
  Administered 2016-01-20 (×2): 30 mg via INTRAVENOUS

## 2016-01-20 MED ORDER — LIDOCAINE HCL (CARDIAC) 20 MG/ML IV SOLN
INTRAVENOUS | Status: DC | PRN
  Administered 2016-01-20: 50 mg via INTRAVENOUS

## 2016-01-20 SURGICAL SUPPLY — 21 items

## 2016-01-20 NOTE — Op Note (Signed)
[facility] Patient Name: Rose Figueroa Procedure Date: 01/20/2016 MRN: [id number] Attending MD: Arta Silence , MD Date of Birth: [birth year] CSN: [id number] Age: 60 Admit Type: Outpatient Procedure:                Colonoscopy Indications:              High risk colon cancer surveillance: Personal                            history of colonic polyps, Last colonoscopy: 2011,                            Family history of colonic polyps in a first-degree                            relative Providers:                Arta Silence, MD, Cleda Daub, RN, Miaisabella Bacorn Dalton, Technician Referring MD:              Medicines:                Propofol per Anesthesia Complications:            No immediate complications. Estimated Blood Loss:     Estimated blood loss was minimal. Procedure:                Pre-Anesthesia Assessment:                           - Prior to the procedure, a History and Physical                            was performed, and patient medications and                            allergies were reviewed. The patient's tolerance of                            previous anesthesia was also reviewed. The risks                            and benefits of the procedure and the sedation                            options and risks were discussed with the patient.                            All questions were answered, and informed consent                            was obtained. Prior Anticoagulants: The patient has                            taken no previous anticoagulant  or antiplatelet                            agents. ASA Grade Assessment: II - A patient with                            mild systemic disease. After reviewing the risks                            and benefits, the patient was deemed in                            satisfactory condition to undergo the procedure.                           After obtaining informed consent, the  colonoscope                            was passed under direct vision. Throughout the                            procedure, the patient's blood pressure, pulse, and                            oxygen saturations were monitored continuously. The                            Colonoscope was introduced through the anus and                            advanced to the the cecum, identified by                            appendiceal orifice and ileocecal valve. The                            ileocecal valve, appendiceal orifice, and rectum                            were photographed. The entire colon was examined.                            The colonoscopy was performed without difficulty.                            The patient tolerated the procedure well. The                            quality of the bowel preparation was good. Findings:      The perianal and digital rectal examinations were normal.      Internal hemorrhoids were found during retroflexion. The hemorrhoids       were moderate.      No additional abnormalities were found on retroflexion.      A 3 mm polyp was found  in the transverse colon. The polyp was sessile.       The polyp was removed with a cold biopsy forceps. Resection and       retrieval were complete.      A 6 mm polyp was found in the ascending colon. The polyp was sessile.       The polyp was removed with a hot snare. Resection and retrieval were       complete.      Colon otherwise normal; no other polyps, masses, vascular ectasias, or       inflammatory changes were seen. Impression:               - Internal hemorrhoids.                           - One 3 mm polyp in the transverse colon, removed                            with a cold biopsy forceps. Resected and retrieved.                           - One 6 mm polyp in the ascending colon, removed                            with a hot snare. Resected and retrieved.                           - The examination was  otherwise normal. Moderate Sedation:      None Recommendation:           - Patient has a contact number available for                            emergencies. The signs and symptoms of potential                            delayed complications were discussed with the                            patient. Return to normal activities tomorrow.                            Written discharge instructions were provided to the                            patient.                           - Discharge patient to home (via wheelchair).                           - Resume previous diet today.                           - Continue present medications.                           -  Await pathology results.                           - Repeat colonoscopy in 5 years for surveillance                            based on pathology results and pending patient's                            state-of-health at that time. Patient needs office                            visit prior to scheduling future colonoscopy                            procedures.                           - Return to GI clinic PRN.                           - Return to referring physician as previously                            scheduled. Procedure Code(s):        --- Professional ---                           [phone], Colonoscopy, flexible; with removal of                            tumor(s), polyp(s), or other lesion(s) by snare                            technique                           45380, 3, Colonoscopy, flexible; with biopsy,                            single or multiple Diagnosis Code(s):        --- Professional ---                           Z86.010, Personal history of colonic polyps                           D12.3, Benign neoplasm of transverse colon (hepatic                            flexure or splenic flexure)                           D12.2, Benign neoplasm of ascending colon                           K64.8, Other  hemorrhoids  Z83.71, Family history of colonic polyps CPT copyright 2016 [employer]. All rights reserved. The codes documented in this report are preliminary and upon coder review may  be revised to meet current compliance requirements. Arta Silence, MD 01/20/2016 9:17:20 AM This report has been signed electronically. Number of Addenda: 0

## 2016-01-20 NOTE — Transfer of Care (Signed)
Immediate Anesthesia Transfer of Care Note  Patient: Rose Figueroa  Procedure(s) Performed: Procedure(s): COLONOSCOPY WITH PROPOFOL (N/A)  Patient Location: PACU  Anesthesia Type:MAC  Level of Consciousness: awake, alert  and oriented  Airway & Oxygen Therapy: Patient Spontanous Breathing and Patient connected to face mask oxygen  Post-op Assessment: Report given to RN and Post -op Vital signs reviewed and stable  Post vital signs: Reviewed and stable  Last Vitals:  Filed Vitals:   01/20/16 0746  BP: 140/87  Pulse: 77  Temp: 37.1 C  Resp: 12    Last Pain: There were no vitals filed for this visit.       Complications: No apparent anesthesia complications

## 2016-01-20 NOTE — Anesthesia Preprocedure Evaluation (Signed)
Anesthesia Evaluation  Patient identified by MRN, date of birth, ID band Patient awake    Reviewed: Allergy & Precautions, NPO status , Patient's Chart, lab work & pertinent test results  History of Anesthesia Complications (+) PONV and history of anesthetic complications  Airway Mallampati: II  TM Distance: >3 FB Neck ROM: Full    Dental no notable dental hx.    Pulmonary former smoker,    Pulmonary exam normal breath sounds clear to auscultation       Cardiovascular Normal cardiovascular exam Rhythm:Regular Rate:Normal     Neuro/Psych Anxiety negative neurological ROS     GI/Hepatic Neg liver ROS, GERD  ,  Endo/Other  negative endocrine ROS  Renal/GU negative Renal ROS  negative genitourinary   Musculoskeletal negative musculoskeletal ROS (+)   Abdominal   Peds negative pediatric ROS (+)  Hematology negative hematology ROS (+)   Anesthesia Other Findings   Reproductive/Obstetrics negative OB ROS                             Anesthesia Physical Anesthesia Plan  ASA: II  Anesthesia Plan: MAC   Post-op Pain Management:    Induction: Intravenous  Airway Management Planned: Natural Airway  Additional Equipment:   Intra-op Plan:   Post-operative Plan:   Informed Consent: I have reviewed the patients History and Physical, chart, labs and discussed the procedure including the risks, benefits and alternatives for the proposed anesthesia with the patient or authorized representative who has indicated his/her understanding and acceptance.   Dental advisory given  Plan Discussed with: CRNA  Anesthesia Plan Comments:         Anesthesia Quick Evaluation

## 2016-01-20 NOTE — Anesthesia Postprocedure Evaluation (Signed)
Anesthesia Post Note  Patient: Rose Figueroa  Procedure(s) Performed: Procedure(s) (LRB): COLONOSCOPY WITH PROPOFOL (N/A)  Patient location during evaluation: PACU Anesthesia Type: MAC Level of consciousness: awake and alert Pain management: pain level controlled Vital Signs Assessment: post-procedure vital signs reviewed and stable Respiratory status: spontaneous breathing, nonlabored ventilation, respiratory function stable and patient connected to nasal cannula oxygen Cardiovascular status: stable and blood pressure returned to baseline Anesthetic complications: no    Last Vitals:  Filed Vitals:   01/20/16 0930 01/20/16 0937  BP: 144/86 144/101  Pulse: 74 70  Temp:    Resp: 19 15    Last Pain: There were no vitals filed for this visit.               Earl Zellmer J

## 2016-01-20 NOTE — Discharge Instructions (Signed)
Colonoscopy, Care After °Refer to this sheet in the next few weeks. These instructions provide you with information on caring for yourself after your procedure. Your health care provider may also give you more specific instructions. Your treatment has been planned according to current medical practices, but problems sometimes occur. Call your health care provider if you have any problems or questions after your procedure. °WHAT TO EXPECT AFTER THE PROCEDURE  °After your procedure, it is typical to have the following: °· A small amount of blood in your stool. °· Moderate amounts of gas and mild abdominal cramping or bloating. °HOME CARE INSTRUCTIONS °· Do not drive, operate machinery, or sign important documents for 24 hours. °· You may shower and resume your regular physical activities, but move at a slower pace for the first 24 hours. °· Take frequent rest periods for the first 24 hours. °· Walk around or put a warm pack on your abdomen to help reduce abdominal cramping and bloating. °· Drink enough fluids to keep your urine clear or pale yellow. °· You may resume your normal diet as instructed by your health care provider. Avoid heavy or fried foods that are hard to digest. °· Avoid drinking alcohol for 24 hours or as instructed by your health care provider. °· Only take over-the-counter or prescription medicines as directed by your health care provider. °· If a tissue sample (biopsy) was taken during your procedure: °¨ Do not take aspirin or blood thinners for 7 days, or as instructed by your health care provider. °¨ Do not drink alcohol for 7 days, or as instructed by your health care provider. °¨ Eat soft foods for the first 24 hours. °SEEK MEDICAL CARE IF: °You have persistent spotting of blood in your stool 2-3 days after the procedure. °SEEK IMMEDIATE MEDICAL CARE IF: °· You have more than a small spotting of blood in your stool. °· You pass large blood clots in your stool. °· Your abdomen is swollen  (distended). °· You have nausea or vomiting. °· You have a fever. °· You have increasing abdominal pain that is not relieved with medicine. °  °This information is not intended to replace advice given to you by your health care provider. Make sure you discuss any questions you have with your health care provider. °  °Document Released: 02/23/2004 Document Revised: 05/01/2013 Document Reviewed: 03/18/2013 °Elsevier Interactive Patient Education ©2016 [employer] ° °

## 2016-01-20 NOTE — H&P (Signed)
Patient interval history reviewed.  Patient examined again.  There has been no change from documented H/P dated 01/19/16 (scanned into chart from our office) except as documented above.  Assessment:  1.  Personal history of colonic polyps. 2.  Family history of colonic polyps. 3.  Lung cancer, managed by Dr. Julien Nordmann, who feels patient's cancer is under adequate control and that we should proceed with any colon cancer screening as otherwise felt routinely indicated.  Plan:  1.  Colonoscopy. 2.  Risks (bleeding, infection, bowel perforation that could require surgery, sedation-related changes in cardiopulmonary systems), benefits (identification and possible treatment of source of symptoms, exclusion of certain causes of symptoms), and alternatives (watchful waiting, radiographic imaging studies, empiric medical treatment) of colonoscopy were explained to patient/family in detail and patient wishes to proceed.

## 2016-01-21 ENCOUNTER — Encounter (HOSPITAL_COMMUNITY): Payer: Self-pay | Admitting: Gastroenterology

## 2016-01-25 ENCOUNTER — Ambulatory Visit: Payer: Managed Care, Other (non HMO)

## 2016-01-25 ENCOUNTER — Other Ambulatory Visit (HOSPITAL_BASED_OUTPATIENT_CLINIC_OR_DEPARTMENT_OTHER): Payer: Managed Care, Other (non HMO)

## 2016-01-25 ENCOUNTER — Ambulatory Visit (HOSPITAL_BASED_OUTPATIENT_CLINIC_OR_DEPARTMENT_OTHER): Payer: Managed Care, Other (non HMO) | Admitting: Internal Medicine

## 2016-01-25 ENCOUNTER — Ambulatory Visit (HOSPITAL_BASED_OUTPATIENT_CLINIC_OR_DEPARTMENT_OTHER): Payer: Managed Care, Other (non HMO)

## 2016-01-25 ENCOUNTER — Encounter: Payer: Self-pay | Admitting: Internal Medicine

## 2016-01-25 VITALS — BP 127/87 | HR 75 | Temp 97.7°F | Resp 18 | Ht 63.0 in | Wt 140.9 lb

## 2016-01-25 DIAGNOSIS — R918 Other nonspecific abnormal finding of lung field: Secondary | ICD-10-CM

## 2016-01-25 DIAGNOSIS — K208 Other esophagitis: Secondary | ICD-10-CM

## 2016-01-25 DIAGNOSIS — C3411 Malignant neoplasm of upper lobe, right bronchus or lung: Secondary | ICD-10-CM

## 2016-01-25 DIAGNOSIS — C3491 Malignant neoplasm of unspecified part of right bronchus or lung: Secondary | ICD-10-CM

## 2016-01-25 DIAGNOSIS — Z5111 Encounter for antineoplastic chemotherapy: Secondary | ICD-10-CM

## 2016-01-25 DIAGNOSIS — Z95828 Presence of other vascular implants and grafts: Secondary | ICD-10-CM

## 2016-01-25 LAB — COMPREHENSIVE METABOLIC PANEL
ALBUMIN: 3.7 g/dL (ref 3.5–5.0)
ALK PHOS: 49 U/L (ref 40–150)
ALT: 33 U/L (ref 0–55)
ANION GAP: 9 meq/L (ref 3–11)
AST: 23 U/L (ref 5–34)
BUN: 16 mg/dL (ref 7.0–26.0)
CALCIUM: 9.8 mg/dL (ref 8.4–10.4)
CO2: 27 mEq/L (ref 22–29)
Chloride: 105 mEq/L (ref 98–109)
Creatinine: 0.8 mg/dL (ref 0.6–1.1)
EGFR: 79 mL/min/{1.73_m2} — AB (ref >90)
GLUCOSE: 108 mg/dL (ref 70–140)
POTASSIUM: 4.1 meq/L (ref 3.5–5.1)
SODIUM: 141 meq/L (ref 136–145)
Total Bilirubin: <0.3 mg/dL (ref 0.20–1.20)
Total Protein: 6.6 g/dL (ref 6.4–8.3)

## 2016-01-25 LAB — CBC WITH DIFFERENTIAL/PLATELET
BASO%: 0.1 % (ref 0.0–2.0)
BASOS ABS: 0 10*3/uL (ref 0.0–0.1)
EOS ABS: 0 10*3/uL (ref 0.0–0.5)
EOS%: 0.2 % (ref 0.0–7.0)
HCT: 32.1 % — ABNORMAL LOW (ref 34.8–46.6)
HEMOGLOBIN: 10.6 g/dL — AB (ref 11.6–15.9)
LYMPH%: 12.6 % — AB (ref 14.0–49.7)
MCH: 32 pg (ref 25.1–34.0)
MCHC: 33 g/dL (ref 31.5–36.0)
MCV: 96.9 fL (ref 79.5–101.0)
MONO#: 0.5 10*3/uL (ref 0.1–0.9)
MONO%: 9.6 % (ref 0.0–14.0)
NEUT#: 3.6 10*3/uL (ref 1.5–6.5)
NEUT%: 77.5 % — ABNORMAL HIGH (ref 38.4–76.8)
Platelets: 270 10*3/uL (ref 145–400)
RBC: 3.31 10*6/uL — ABNORMAL LOW (ref 3.70–5.45)
RDW: 18.9 % — AB (ref 11.2–14.5)
WBC: 4.7 10*3/uL (ref 3.9–10.3)
lymph#: 0.6 10*3/uL — ABNORMAL LOW (ref 0.9–3.3)

## 2016-01-25 MED ORDER — SODIUM CHLORIDE 0.9 % IV SOLN
10.0000 mg | Freq: Once | INTRAVENOUS | Status: AC
  Administered 2016-01-25: 10 mg via INTRAVENOUS
  Filled 2016-01-25: qty 1

## 2016-01-25 MED ORDER — SODIUM CHLORIDE 0.9 % IV SOLN
485.0000 mg/m2 | Freq: Once | INTRAVENOUS | Status: AC
  Administered 2016-01-25: 800 mg via INTRAVENOUS
  Filled 2016-01-25: qty 28

## 2016-01-25 MED ORDER — SODIUM CHLORIDE 0.9 % IV SOLN
500.0000 mg | Freq: Once | INTRAVENOUS | Status: AC
  Administered 2016-01-25: 500 mg via INTRAVENOUS
  Filled 2016-01-25: qty 50

## 2016-01-25 MED ORDER — PALONOSETRON HCL INJECTION 0.25 MG/5ML
INTRAVENOUS | Status: AC
  Filled 2016-01-25: qty 5

## 2016-01-25 MED ORDER — SODIUM CHLORIDE 0.9 % IJ SOLN
10.0000 mL | INTRAMUSCULAR | Status: DC | PRN
  Administered 2016-01-25: 10 mL via INTRAVENOUS
  Filled 2016-01-25: qty 10

## 2016-01-25 MED ORDER — PALONOSETRON HCL INJECTION 0.25 MG/5ML
0.2500 mg | Freq: Once | INTRAVENOUS | Status: AC
Start: 2016-01-25 — End: 2016-01-25
  Administered 2016-01-25: 0.25 mg via INTRAVENOUS

## 2016-01-25 MED ORDER — HEPARIN SOD (PORK) LOCK FLUSH 100 UNIT/ML IV SOLN
500.0000 [IU] | Freq: Once | INTRAVENOUS | Status: AC | PRN
  Administered 2016-01-25: 500 [IU]
  Filled 2016-01-25: qty 5

## 2016-01-25 MED ORDER — CARBOPLATIN CHEMO INTRADERMAL TEST DOSE 100MCG/0.02ML
100.0000 ug | Freq: Once | INTRADERMAL | Status: AC
  Administered 2016-01-25: 100 ug via INTRADERMAL
  Filled 2016-01-25: qty 0.02

## 2016-01-25 MED ORDER — SODIUM CHLORIDE 0.9 % IV SOLN
Freq: Once | INTRAVENOUS | Status: AC
  Administered 2016-01-25: 13:00:00 via INTRAVENOUS

## 2016-01-25 MED ORDER — SODIUM CHLORIDE 0.9% FLUSH
10.0000 mL | INTRAVENOUS | Status: DC | PRN
  Administered 2016-01-25: 10 mL
  Filled 2016-01-25: qty 10

## 2016-01-25 NOTE — Progress Notes (Signed)
.      [place] [phone]   [phone]  OFFICE PROGRESS NOTE  Corine Shelter, PA-C [address] [place] [address]  DIAGNOSIS: Stage IIIB (T2a, N3, M0) non-small cell lung cancer, adenocarcinoma with negative EGFR mutation and negative ALK gene translocation diagnosed in October 2016.  PRIOR THERAPY: Concurrent chemoradiation with weekly carboplatin for AUC of 2 and paclitaxel 45 MG/M2 status post 6 cycles. Last dose was given 07/13/2015 with partial response.  CURRENT THERAPY: Systemic chemotherapy with carboplatin for AUC of 5 and Alimta 500 MG/M2 every 3 weeks. First dose 11/02/2015. Status post 4 cycles.  INTERVAL HISTORY: Rose Figueroa 60 y.o. female returns to the clinic today for follow-up visit accompanied by her husband. The patient is currently on systemic chemotherapy with carboplatin and Alimta and tolerating it fairly well with no significant adverse effects. She status post 4 cycles. She has a little bit more fatigue and cough recently. She was mowing the grass yesterday. She denied having any significant weight loss or night sweats. She denied having any chest pain, shortness of breath, cough or hemoptysis. She has no significant nausea or vomiting, no fever or chills.    MEDICAL HISTORY: Past Medical History  Diagnosis Date  . Osteoporosis   . H/O cold sores   . Diverticulosis   . Rectal bleeding   . right upper lobe lung mass 04/15/2015    malignant tumor found - 10'16-radiation and chemotherapy and remains with chemo at present- Dr. Earlie Server follows.  . Back pain     right back lateral  . Colon polyps   . Complication of anesthesia     pt. states that she is difficult to arouse  . PONV (postoperative nausea and vomiting)   . Anxiety   . History of anemia   . Dysrhythmia     "skip beat in early 20's"  . Complication of anesthesia     "indigestion and burping " after eating.  Marland Kitchen BCC (basal cell carcinoma of  skin)   . Lung cancer ([place])     RUL mass -being tx- radiation and chemo- remains last chemo 01-04-16    ALLERGIES:  has No Known Allergies.  MEDICATIONS:  Current Outpatient Prescriptions  Medication Sig Dispense Refill  . Biotin 5000 MCG CAPS Take 5,000 mcg by mouth daily.    . Calcium Carb-Cholecalciferol (CALCIUM 1000 + D PO) Take 5 tablets by mouth daily. D3 = 400 IU per each.    . cetirizine (ZYRTEC) 10 MG tablet Take 10 mg by mouth daily.     Marland Kitchen dexamethasone (DECADRON) 4 MG tablet 4 mg by mouth twice a day the day before, day of and day after the chemotherapy every 3 weeks 40 tablet 1  . folic acid (FOLVITE) 1 MG tablet Take 1 tablet (1 mg total) by mouth daily. 30 tablet 4  . L-LYSINE PO Take 3,000 mg by mouth daily.     Marland Kitchen lidocaine-prilocaine (EMLA) cream Apply 1 application topically as needed. 30 g 0  . Melatonin 3 MG CAPS Take 3 mg by mouth once.    . Misc Natural Products (TART CHERRY ADVANCED PO) Take 1,200 mg by mouth daily.    Marland Kitchen MOVIPREP 100 g SOLR See admin instructions.  0  . Multiple Vitamins-Minerals (CENTRUM SILVER PO) Take 1 tablet by mouth daily.    . Omega-3 Fatty Acids (RA FISH OIL) 1400 MG CPDR Take 2 capsules by mouth daily.     . Turmeric  Curcumin 500 MG CAPS Take 1 each by mouth daily.     . vitamin B-12 (CYANOCOBALAMIN) 1000 MCG tablet Take 1,000 mcg by mouth daily.     No current facility-administered medications for this visit.    SURGICAL HISTORY:  Past Surgical History  Procedure Laterality Date  . Cystectomy      right breast x 2  . Colonoscopy    . Colonoscopy w/ polypectomy    . Video bronchoscopy with endobronchial ultrasound N/A 05/07/2015    Procedure: VIDEO BRONCHOSCOPY WITH ENDOBRONCHIAL ULTRASOUND;  Surgeon: Ivin Poot, MD;  Location: [facility];  Service: Thoracic;  Laterality: N/A;  . Mediastinoscopy N/A 05/07/2015    Procedure: MEDIASTINOSCOPY;  Surgeon: Ivin Poot, MD;  Location: [facility];  Service: Thoracic;  Laterality: N/A;  .  Tonsillectomy    . Colonoscopy with propofol N/A 01/20/2016    Procedure: COLONOSCOPY WITH PROPOFOL;  Surgeon: Arta Silence, MD;  Location: [facility];  Service: Endoscopy;  Laterality: N/A;    REVIEW OF SYSTEMS:  A comprehensive review of systems was negative except for: Constitutional: positive for fatigue   PHYSICAL EXAMINATION: General appearance: alert, cooperative, fatigued and no distress Head: Normocephalic, without obvious abnormality, atraumatic Neck: no adenopathy, no JVD, supple, symmetrical, trachea midline and thyroid not enlarged, symmetric, no tenderness/mass/nodules Lymph nodes: Cervical, supraclavicular, and axillary nodes normal. Resp: clear to auscultation bilaterally Back: symmetric, no curvature. ROM normal. No CVA tenderness. Cardio: regular rate and rhythm, S1, S2 normal, no murmur, click, rub or gallop GI: soft, non-tender; bowel sounds normal; no masses,  no organomegaly Extremities: extremities normal, atraumatic, no cyanosis or edema Neurologic: Alert and oriented X 3, normal strength and tone. Normal symmetric reflexes. Normal coordination and gait  ECOG PERFORMANCE STATUS: 1 - Symptomatic but completely ambulatory  Blood pressure 127/87, pulse 75, temperature 97.7 F (36.5 C), temperature source Oral, resp. rate 18, height _0  (1.6 m), weight 140 lb 14.4 oz (63.912 kg), SpO2 99 %.  LABORATORY DATA: Lab Results  Component Value Date   WBC 4.7 01/25/2016   HGB 10.6* 01/25/2016   HCT 32.1* 01/25/2016   MCV 96.9 01/25/2016   PLT 270 01/25/2016      Chemistry      Component Value Date/Time   NA 143 01/18/2016 0926   NA 139 07/03/2015 1200   K 4.0 01/18/2016 0926   K 4.3 07/03/2015 1200   CL 107 07/03/2015 1200   CO2 27 01/18/2016 0926   CO2 26 07/03/2015 1200   BUN 22.5 01/18/2016 0926   BUN 15 07/03/2015 1200   CREATININE 1.0 01/18/2016 0926   CREATININE 0.56 07/03/2015 1200      Component Value Date/Time   CALCIUM 9.6 01/18/2016 0926    CALCIUM 9.3 07/03/2015 1200   ALKPHOS 48 01/18/2016 0926   ALKPHOS 35* 04/30/2015 1336   AST 26 01/18/2016 0926   AST 27 04/30/2015 1336   ALT 42 01/18/2016 0926   ALT 24 04/30/2015 1336   BILITOT <0.30 01/18/2016 0926   BILITOT 0.6 04/30/2015 1336       RADIOGRAPHIC STUDIES:  ASSESSMENT AND PLAN: This is a very pleasant 60 years old white female with stage IIIB non-small cell lung cancer currently undergoing a course of concurrent chemoradiation with weekly carboplatin and paclitaxel is status post 7 cycles and tolerating her treatment fairly well except for the radiation induced esophagitis as well as dry cough. She is currently undergoing systemic chemotherapy with carboplatin and Alimta status post 4 cycles and tolerating  the treatment well. I recommended for her to proceed with cycle #5 of her chemotherapy today as a scheduled. She will come back for follow-up visit in 3 weeks for evaluation before starting cycle #6. She was advised to call immediately if she has any concerning symptoms in the interval. The patient voices understanding of current disease status and treatment options and is in agreement with the current care plan.  All questions were answered. The patient knows to call the clinic with any problems, questions or concerns. We can certainly see the patient much sooner if necessary.  Disclaimer: This note was dictated with voice recognition software. Similar sounding words can inadvertently be transcribed and may not be corrected upon review.

## 2016-01-25 NOTE — Patient Instructions (Signed)
[facility] Discharge Instructions for Patients Receiving Chemotherapy  Today you received the following chemotherapy agents: Alimta and Carboplatin.  To help prevent nausea and vomiting after your treatment, we encourage you to take your nausea medication as directed.   If you develop nausea and vomiting that is not controlled by your nausea medication, call the clinic.   BELOW ARE SYMPTOMS THAT SHOULD BE REPORTED IMMEDIATELY:  *FEVER GREATER THAN 100.5 F  *CHILLS WITH OR WITHOUT FEVER  NAUSEA AND VOMITING THAT IS NOT CONTROLLED WITH YOUR NAUSEA MEDICATION  *UNUSUAL SHORTNESS OF BREATH  *UNUSUAL BRUISING OR BLEEDING  TENDERNESS IN MOUTH AND THROAT WITH OR WITHOUT PRESENCE OF ULCERS  *URINARY PROBLEMS  *BOWEL PROBLEMS  UNUSUAL RASH Items with * indicate a potential emergency and should be followed up as soon as possible.  Feel free to call the clinic you have any questions or concerns. The clinic phone number is [phone].  Please show the CHEMO ALERT CARD at check-in to the Emergency Department and triage nurse.   

## 2016-01-25 NOTE — Patient Instructions (Signed)

## 2016-02-01 ENCOUNTER — Other Ambulatory Visit (HOSPITAL_BASED_OUTPATIENT_CLINIC_OR_DEPARTMENT_OTHER): Payer: Managed Care, Other (non HMO)

## 2016-02-01 ENCOUNTER — Ambulatory Visit (HOSPITAL_BASED_OUTPATIENT_CLINIC_OR_DEPARTMENT_OTHER): Payer: Managed Care, Other (non HMO)

## 2016-02-01 DIAGNOSIS — C3411 Malignant neoplasm of upper lobe, right bronchus or lung: Secondary | ICD-10-CM | POA: Diagnosis not present

## 2016-02-01 DIAGNOSIS — C3491 Malignant neoplasm of unspecified part of right bronchus or lung: Secondary | ICD-10-CM

## 2016-02-01 DIAGNOSIS — R918 Other nonspecific abnormal finding of lung field: Secondary | ICD-10-CM

## 2016-02-01 DIAGNOSIS — Z95828 Presence of other vascular implants and grafts: Secondary | ICD-10-CM

## 2016-02-01 DIAGNOSIS — Z5111 Encounter for antineoplastic chemotherapy: Secondary | ICD-10-CM

## 2016-02-01 LAB — CBC WITH DIFFERENTIAL/PLATELET
BASO%: 1.2 % (ref 0.0–2.0)
Basophils Absolute: 0 10*3/uL (ref 0.0–0.1)
EOS%: 3 % (ref 0.0–7.0)
Eosinophils Absolute: 0.1 10*3/uL (ref 0.0–0.5)
HCT: 32 % — ABNORMAL LOW (ref 34.8–46.6)
HGB: 10.7 g/dL — ABNORMAL LOW (ref 11.6–15.9)
LYMPH%: 40.4 % (ref 14.0–49.7)
MCH: 32.4 pg (ref 25.1–34.0)
MCHC: 33.5 g/dL (ref 31.5–36.0)
MCV: 96.9 fL (ref 79.5–101.0)
MONO#: 0.3 10*3/uL (ref 0.1–0.9)
MONO%: 14 % (ref 0.0–14.0)
NEUT%: 41.4 % (ref 38.4–76.8)
NEUTROS ABS: 0.9 10*3/uL — AB (ref 1.5–6.5)
PLATELETS: 190 10*3/uL (ref 145–400)
RBC: 3.3 10*6/uL — AB (ref 3.70–5.45)
RDW: 17 % — ABNORMAL HIGH (ref 11.2–14.5)
WBC: 2.1 10*3/uL — AB (ref 3.9–10.3)
lymph#: 0.8 10*3/uL — ABNORMAL LOW (ref 0.9–3.3)

## 2016-02-01 LAB — COMPREHENSIVE METABOLIC PANEL
ALT: 49 U/L (ref 0–55)
ANION GAP: 7 meq/L (ref 3–11)
AST: 36 U/L — ABNORMAL HIGH (ref 5–34)
Albumin: 3.5 g/dL (ref 3.5–5.0)
Alkaline Phosphatase: 50 U/L (ref 40–150)
BUN: 23 mg/dL (ref 7.0–26.0)
CHLORIDE: 107 meq/L (ref 98–109)
CO2: 28 meq/L (ref 22–29)
CREATININE: 0.9 mg/dL (ref 0.6–1.1)
Calcium: 9.4 mg/dL (ref 8.4–10.4)
EGFR: 72 mL/min/{1.73_m2} — ABNORMAL LOW (ref >90)
GLUCOSE: 101 mg/dL (ref 70–140)
Potassium: 4.6 mEq/L (ref 3.5–5.1)
SODIUM: 143 meq/L (ref 136–145)
TOTAL PROTEIN: 6.3 g/dL — AB (ref 6.4–8.3)
Total Bilirubin: 0.3 mg/dL (ref 0.20–1.20)

## 2016-02-01 MED ORDER — SODIUM CHLORIDE 0.9 % IJ SOLN
10.0000 mL | INTRAMUSCULAR | Status: DC | PRN
  Administered 2016-02-01: 10 mL via INTRAVENOUS
  Filled 2016-02-01: qty 10

## 2016-02-01 MED ORDER — HEPARIN SOD (PORK) LOCK FLUSH 100 UNIT/ML IV SOLN
500.0000 [IU] | Freq: Once | INTRAVENOUS | Status: AC | PRN
  Administered 2016-02-01: 500 [IU] via INTRAVENOUS
  Filled 2016-02-01: qty 5

## 2016-02-01 NOTE — Patient Instructions (Signed)

## 2016-02-08 ENCOUNTER — Other Ambulatory Visit (HOSPITAL_BASED_OUTPATIENT_CLINIC_OR_DEPARTMENT_OTHER): Payer: Managed Care, Other (non HMO)

## 2016-02-08 ENCOUNTER — Ambulatory Visit (HOSPITAL_BASED_OUTPATIENT_CLINIC_OR_DEPARTMENT_OTHER): Payer: Managed Care, Other (non HMO)

## 2016-02-08 DIAGNOSIS — C3411 Malignant neoplasm of upper lobe, right bronchus or lung: Secondary | ICD-10-CM

## 2016-02-08 DIAGNOSIS — Z95828 Presence of other vascular implants and grafts: Secondary | ICD-10-CM

## 2016-02-08 LAB — COMPREHENSIVE METABOLIC PANEL
ALBUMIN: 3.5 g/dL (ref 3.5–5.0)
ALK PHOS: 43 U/L (ref 40–150)
ALT: 58 U/L — ABNORMAL HIGH (ref 0–55)
AST: 37 U/L — AB (ref 5–34)
Anion Gap: 8 mEq/L (ref 3–11)
BUN: 19.6 mg/dL (ref 7.0–26.0)
CO2: 25 mEq/L (ref 22–29)
Calcium: 9.3 mg/dL (ref 8.4–10.4)
Chloride: 110 mEq/L — ABNORMAL HIGH (ref 98–109)
Creatinine: 0.8 mg/dL (ref 0.6–1.1)
EGFR: 79 mL/min/{1.73_m2} — ABNORMAL LOW (ref >90)
GLUCOSE: 106 mg/dL (ref 70–140)
Potassium: 4.1 mEq/L (ref 3.5–5.1)
SODIUM: 143 meq/L (ref 136–145)
TOTAL PROTEIN: 6.2 g/dL — AB (ref 6.4–8.3)

## 2016-02-08 LAB — CBC WITH DIFFERENTIAL/PLATELET
BASO%: 0.4 % (ref 0.0–2.0)
Basophils Absolute: 0 10*3/uL (ref 0.0–0.1)
EOS%: 0.5 % (ref 0.0–7.0)
Eosinophils Absolute: 0 10*3/uL (ref 0.0–0.5)
HCT: 31.6 % — ABNORMAL LOW (ref 34.8–46.6)
HEMOGLOBIN: 10.5 g/dL — AB (ref 11.6–15.9)
LYMPH%: 8.3 % — ABNORMAL LOW (ref 14.0–49.7)
MCH: 32.7 pg (ref 25.1–34.0)
MCHC: 33.2 g/dL (ref 31.5–36.0)
MCV: 98.4 fL (ref 79.5–101.0)
MONO#: 0.4 10*3/uL (ref 0.1–0.9)
MONO%: 5.1 % (ref 0.0–14.0)
NEUT%: 85.7 % — ABNORMAL HIGH (ref 38.4–76.8)
NEUTROS ABS: 7.1 10*3/uL — AB (ref 1.5–6.5)
Platelets: 173 10*3/uL (ref 145–400)
RBC: 3.21 10*6/uL — ABNORMAL LOW (ref 3.70–5.45)
RDW: 17.2 % — AB (ref 11.2–14.5)
WBC: 8.3 10*3/uL (ref 3.9–10.3)
lymph#: 0.7 10*3/uL — ABNORMAL LOW (ref 0.9–3.3)

## 2016-02-08 MED ORDER — SODIUM CHLORIDE 0.9 % IJ SOLN
10.0000 mL | INTRAMUSCULAR | Status: DC | PRN
  Administered 2016-02-08: 10 mL via INTRAVENOUS
  Filled 2016-02-08: qty 10

## 2016-02-08 MED ORDER — HEPARIN SOD (PORK) LOCK FLUSH 100 UNIT/ML IV SOLN
500.0000 [IU] | Freq: Once | INTRAVENOUS | Status: AC | PRN
  Administered 2016-02-08: 500 [IU] via INTRAVENOUS
  Filled 2016-02-08: qty 5

## 2016-02-08 NOTE — Patient Instructions (Signed)

## 2016-02-09 ENCOUNTER — Telehealth: Payer: Self-pay | Admitting: *Deleted

## 2016-02-09 NOTE — Telephone Encounter (Signed)
Pt called c/o pain/pressure in abdomen, Saturday had breaking out of sweats, muscle stiffness, Sunday I was cold, then Monday i was bloated, soft stools with normal color, tried the heating pad. The area is from my stomach to the side of my abdomen. Took a few aspirin last night, which helped some. Denies n/v,  Carboplatin/Alimta 7/3.  It's only on my right side, I don't have any gas, I went for a walk and seemed to feel better then i got home and felt worse.

## 2016-02-09 NOTE — Telephone Encounter (Signed)
Instructed pt tot take Gas relief medication x next few days if not improved to call back.Pt verbalized understanding. No further concerns.

## 2016-02-15 ENCOUNTER — Encounter: Payer: Self-pay | Admitting: *Deleted

## 2016-02-15 ENCOUNTER — Encounter: Payer: Self-pay | Admitting: Internal Medicine

## 2016-02-15 ENCOUNTER — Telehealth: Payer: Self-pay | Admitting: Internal Medicine

## 2016-02-15 ENCOUNTER — Ambulatory Visit (HOSPITAL_BASED_OUTPATIENT_CLINIC_OR_DEPARTMENT_OTHER): Payer: Managed Care, Other (non HMO)

## 2016-02-15 ENCOUNTER — Ambulatory Visit (HOSPITAL_BASED_OUTPATIENT_CLINIC_OR_DEPARTMENT_OTHER): Payer: Managed Care, Other (non HMO) | Admitting: Internal Medicine

## 2016-02-15 ENCOUNTER — Ambulatory Visit: Payer: Managed Care, Other (non HMO)

## 2016-02-15 ENCOUNTER — Other Ambulatory Visit (HOSPITAL_BASED_OUTPATIENT_CLINIC_OR_DEPARTMENT_OTHER): Payer: Managed Care, Other (non HMO)

## 2016-02-15 VITALS — BP 144/79 | HR 87 | Temp 98.3°F | Resp 18 | Ht 63.0 in | Wt 142.5 lb

## 2016-02-15 DIAGNOSIS — R05 Cough: Secondary | ICD-10-CM

## 2016-02-15 DIAGNOSIS — C3411 Malignant neoplasm of upper lobe, right bronchus or lung: Secondary | ICD-10-CM

## 2016-02-15 DIAGNOSIS — K208 Other esophagitis: Secondary | ICD-10-CM

## 2016-02-15 DIAGNOSIS — Z5111 Encounter for antineoplastic chemotherapy: Secondary | ICD-10-CM | POA: Diagnosis not present

## 2016-02-15 DIAGNOSIS — Z95828 Presence of other vascular implants and grafts: Secondary | ICD-10-CM

## 2016-02-15 LAB — CBC WITH DIFFERENTIAL/PLATELET
BASO%: 0 % (ref 0.0–2.0)
BASOS ABS: 0 10*3/uL (ref 0.0–0.1)
EOS%: 0 % (ref 0.0–7.0)
Eosinophils Absolute: 0 10*3/uL (ref 0.0–0.5)
HCT: 28.8 % — ABNORMAL LOW (ref 34.8–46.6)
HEMOGLOBIN: 9.6 g/dL — AB (ref 11.6–15.9)
LYMPH%: 9.3 % — ABNORMAL LOW (ref 14.0–49.7)
MCH: 32.2 pg (ref 25.1–34.0)
MCHC: 33.3 g/dL (ref 31.5–36.0)
MCV: 96.6 fL (ref 79.5–101.0)
MONO#: 0.4 10*3/uL (ref 0.1–0.9)
MONO%: 8.1 % (ref 0.0–14.0)
NEUT#: 4.4 10*3/uL (ref 1.5–6.5)
NEUT%: 82.6 % — AB (ref 38.4–76.8)
Platelets: 256 10*3/uL (ref 145–400)
RBC: 2.98 10*6/uL — ABNORMAL LOW (ref 3.70–5.45)
RDW: 16 % — AB (ref 11.2–14.5)
WBC: 5.3 10*3/uL (ref 3.9–10.3)
lymph#: 0.5 10*3/uL — ABNORMAL LOW (ref 0.9–3.3)

## 2016-02-15 LAB — COMPREHENSIVE METABOLIC PANEL
ALT: 22 U/L (ref 0–55)
ANION GAP: 8 meq/L (ref 3–11)
AST: 19 U/L (ref 5–34)
Albumin: 3.6 g/dL (ref 3.5–5.0)
Alkaline Phosphatase: 45 U/L (ref 40–150)
BUN: 18.1 mg/dL (ref 7.0–26.0)
CHLORIDE: 107 meq/L (ref 98–109)
CO2: 25 meq/L (ref 22–29)
Calcium: 9.5 mg/dL (ref 8.4–10.4)
Creatinine: 0.8 mg/dL (ref 0.6–1.1)
EGFR: 78 mL/min/{1.73_m2} — AB (ref >90)
Glucose: 113 mg/dl (ref 70–140)
Potassium: 4.2 mEq/L (ref 3.5–5.1)
Sodium: 140 mEq/L (ref 136–145)
TOTAL PROTEIN: 6.5 g/dL (ref 6.4–8.3)

## 2016-02-15 MED ORDER — CARBOPLATIN CHEMO INTRADERMAL TEST DOSE 100MCG/0.02ML
100.0000 ug | Freq: Once | INTRADERMAL | Status: AC
  Administered 2016-02-15: 100 ug via INTRADERMAL
  Filled 2016-02-15: qty 0.01

## 2016-02-15 MED ORDER — SODIUM CHLORIDE 0.9 % IV SOLN
497.5000 mg | Freq: Once | INTRAVENOUS | Status: AC
  Administered 2016-02-15: 500 mg via INTRAVENOUS
  Filled 2016-02-15: qty 50

## 2016-02-15 MED ORDER — SODIUM CHLORIDE 0.9 % IJ SOLN
10.0000 mL | INTRAMUSCULAR | Status: DC | PRN
  Administered 2016-02-15: 10 mL via INTRAVENOUS
  Filled 2016-02-15: qty 10

## 2016-02-15 MED ORDER — PALONOSETRON HCL INJECTION 0.25 MG/5ML
INTRAVENOUS | Status: AC
  Filled 2016-02-15: qty 5

## 2016-02-15 MED ORDER — SODIUM CHLORIDE 0.9 % IV SOLN
480.0000 mg/m2 | Freq: Once | INTRAVENOUS | Status: AC
  Administered 2016-02-15: 800 mg via INTRAVENOUS
  Filled 2016-02-15: qty 20

## 2016-02-15 MED ORDER — HEPARIN SOD (PORK) LOCK FLUSH 100 UNIT/ML IV SOLN
500.0000 [IU] | Freq: Once | INTRAVENOUS | Status: AC | PRN
  Administered 2016-02-15: 500 [IU]
  Filled 2016-02-15: qty 5

## 2016-02-15 MED ORDER — PALONOSETRON HCL INJECTION 0.25 MG/5ML
0.2500 mg | Freq: Once | INTRAVENOUS | Status: AC
  Administered 2016-02-15: 0.25 mg via INTRAVENOUS

## 2016-02-15 MED ORDER — CYANOCOBALAMIN 1000 MCG/ML IJ SOLN
1000.0000 ug | Freq: Once | INTRAMUSCULAR | Status: AC
  Administered 2016-02-15: 1000 ug via INTRAMUSCULAR

## 2016-02-15 MED ORDER — SODIUM CHLORIDE 0.9 % IV SOLN
10.0000 mg | Freq: Once | INTRAVENOUS | Status: AC
  Administered 2016-02-15: 10 mg via INTRAVENOUS
  Filled 2016-02-15: qty 1

## 2016-02-15 MED ORDER — SODIUM CHLORIDE 0.9 % IV SOLN
Freq: Once | INTRAVENOUS | Status: AC
  Administered 2016-02-15: 15:00:00 via INTRAVENOUS

## 2016-02-15 MED ORDER — SODIUM CHLORIDE 0.9% FLUSH
10.0000 mL | INTRAVENOUS | Status: DC | PRN
  Administered 2016-02-15: 10 mL
  Filled 2016-02-15: qty 10

## 2016-02-15 MED ORDER — CYANOCOBALAMIN 1000 MCG/ML IJ SOLN
INTRAMUSCULAR | Status: AC
  Filled 2016-02-15: qty 1

## 2016-02-15 NOTE — Patient Instructions (Signed)
[facility] Discharge Instructions for Patients Receiving Chemotherapy  Today you received the following chemotherapy agents: Alimta and Carboplatin.  To help prevent nausea and vomiting after your treatment, we encourage you to take your nausea medication as directed.   If you develop nausea and vomiting that is not controlled by your nausea medication, call the clinic.   BELOW ARE SYMPTOMS THAT SHOULD BE REPORTED IMMEDIATELY:  *FEVER GREATER THAN 100.5 F  *CHILLS WITH OR WITHOUT FEVER  NAUSEA AND VOMITING THAT IS NOT CONTROLLED WITH YOUR NAUSEA MEDICATION  *UNUSUAL SHORTNESS OF BREATH  *UNUSUAL BRUISING OR BLEEDING  TENDERNESS IN MOUTH AND THROAT WITH OR WITHOUT PRESENCE OF ULCERS  *URINARY PROBLEMS  *BOWEL PROBLEMS  UNUSUAL RASH Items with * indicate a potential emergency and should be followed up as soon as possible.  Feel free to call the clinic you have any questions or concerns. The clinic phone number is [phone].  Please show the CHEMO ALERT CARD at check-in to the Emergency Department and triage nurse.   

## 2016-02-15 NOTE — Telephone Encounter (Signed)
Gave pt cal & avs °

## 2016-02-15 NOTE — Patient Instructions (Signed)

## 2016-02-15 NOTE — Progress Notes (Signed)
Oncology Nurse Navigator Documentation  Oncology Nurse Navigator Flowsheets 02/15/2016  Navigator Location [facility]-Med Onc  Navigator Encounter Type Clinic/[facility]C  Patient Visit Type MedOnc  Treatment Phase Final Chemo TX  Barriers/Navigation Needs No barriers at this time  Interventions None required  Acuity Level 1  Acuity Level 1 Minimal follow up required  Time Spent with Patient 15   I followed up with Rose Figueroa today.  She is getting her last chemo treatment.  I was able to meet her sister and mother today.  I offered support for next steps.

## 2016-02-15 NOTE — Progress Notes (Signed)
.      [place] [phone]   [phone]) [phone]  OFFICE PROGRESS NOTE  Corine Shelter, PA-C [address] [place] [address]  DIAGNOSIS: Stage IIIB (T2a, N3, M0) non-small cell lung cancer, adenocarcinoma with negative EGFR mutation and negative ALK gene translocation diagnosed in October 2016.  PRIOR THERAPY: Concurrent chemoradiation with weekly carboplatin for AUC of 2 and paclitaxel 45 MG/M2 status post 6 cycles. Last dose was given 07/13/2015 with partial response.  CURRENT THERAPY: Systemic chemotherapy with carboplatin for AUC of 5 and Alimta 500 MG/M2 every 3 weeks. First dose 11/02/2015. Status post 5 cycles.  INTERVAL HISTORY: Rose Figueroa 60 y.o. female returns to the clinic today for follow-up visit accompanied by her sister and mother. The patient is currently on systemic chemotherapy with carboplatin and Alimta and tolerating it fairly well with no significant adverse effects. She complained recently of some abdominal pain but this improved after treatment with Gas-x. She denied having any significant weight loss or night sweats. She denied having any chest pain, shortness of breath, cough or hemoptysis. She has no significant nausea or vomiting, no fever or chills. She is here today to start cycle #6 of her treatment.   MEDICAL HISTORY: Past Medical History:  Diagnosis Date  . Anxiety   . Back pain    right back lateral  . BCC (basal cell carcinoma of skin)   . Colon polyps   . Complication of anesthesia    pt. states that she is difficult to arouse  . Complication of anesthesia    "indigestion and burping " after eating.  . Diverticulosis   . Dysrhythmia    "skip beat in early 20's"  . H/O cold sores   . History of anemia   . Lung cancer ([place])    RUL mass -being tx- radiation and chemo- remains last chemo 01-04-16  . Osteoporosis   . PONV (postoperative nausea and vomiting)   . Rectal bleeding   . right upper lobe lung  mass 04/15/2015   malignant tumor found - 10'16-radiation and chemotherapy and remains with chemo at present- Dr. Earlie Server follows.    ALLERGIES:  has No Known Allergies.  MEDICATIONS:  Current Outpatient Prescriptions  Medication Sig Dispense Refill  . Biotin 5000 MCG CAPS Take 5,000 mcg by mouth daily.    . Calcium Carb-Cholecalciferol (CALCIUM 1000 + D PO) Take 5 tablets by mouth daily. D3 = 400 IU per each.    . cetirizine (ZYRTEC) 10 MG tablet Take 10 mg by mouth daily.     Marland Kitchen dexamethasone (DECADRON) 4 MG tablet 4 mg by mouth twice a day the day before, day of and day after the chemotherapy every 3 weeks 40 tablet 1  . folic acid (FOLVITE) 1 MG tablet Take 1 tablet (1 mg total) by mouth daily. 30 tablet 4  . L-LYSINE PO Take 3,000 mg by mouth daily.     Marland Kitchen lidocaine-prilocaine (EMLA) cream Apply 1 application topically as needed. 30 g 0  . Melatonin 3 MG CAPS Take 3 mg by mouth once.    . Misc Natural Products (TART CHERRY ADVANCED PO) Take 1,200 mg by mouth daily.    . Multiple Vitamins-Minerals (CENTRUM SILVER PO) Take 1 tablet by mouth daily.    . Omega-3 Fatty Acids (RA FISH OIL) 1400 MG CPDR Take 2 capsules by mouth daily.     . Turmeric Curcumin 500 MG CAPS Take 1 each by mouth daily.     Marland Kitchen  vitamin B-12 (CYANOCOBALAMIN) 1000 MCG tablet Take 1,000 mcg by mouth daily.     No current facility-administered medications for this visit.     SURGICAL HISTORY:  Past Surgical History:  Procedure Laterality Date  . COLONOSCOPY    . COLONOSCOPY W/ POLYPECTOMY    . COLONOSCOPY WITH PROPOFOL N/A 01/20/2016   Procedure: COLONOSCOPY WITH PROPOFOL;  Surgeon: Arta Silence, MD;  Location: [facility];  Service: Endoscopy;  Laterality: N/A;  . CYSTECTOMY     right breast x 2  . MEDIASTINOSCOPY N/A 05/07/2015   Procedure: MEDIASTINOSCOPY;  Surgeon: Ivin Poot, MD;  Location: [facility];  Service: Thoracic;  Laterality: N/A;  . TONSILLECTOMY    . VIDEO BRONCHOSCOPY WITH ENDOBRONCHIAL  ULTRASOUND N/A 05/07/2015   Procedure: VIDEO BRONCHOSCOPY WITH ENDOBRONCHIAL ULTRASOUND;  Surgeon: Ivin Poot, MD;  Location: [facility];  Service: Thoracic;  Laterality: N/A;    REVIEW OF SYSTEMS:  A comprehensive review of systems was negative except for: Constitutional: positive for fatigue   PHYSICAL EXAMINATION: General appearance: alert, cooperative, fatigued and no distress Head: Normocephalic, without obvious abnormality, atraumatic Neck: no adenopathy, no JVD, supple, symmetrical, trachea midline and thyroid not enlarged, symmetric, no tenderness/mass/nodules Lymph nodes: Cervical, supraclavicular, and axillary nodes normal. Resp: clear to auscultation bilaterally Back: symmetric, no curvature. ROM normal. No CVA tenderness. Cardio: regular rate and rhythm, S1, S2 normal, no murmur, click, rub or gallop GI: soft, non-tender; bowel sounds normal; no masses,  no organomegaly Extremities: extremities normal, atraumatic, no cyanosis or edema Neurologic: Alert and oriented X 3, normal strength and tone. Normal symmetric reflexes. Normal coordination and gait  ECOG PERFORMANCE STATUS: 1 - Symptomatic but completely ambulatory  Blood pressure (!) 144/79, pulse 87, temperature 98.3 F (36.8 C), temperature source Oral, resp. rate 18, height '5\' 3"'$  (1.6 m), weight 142 lb 8 oz (64.6 kg), SpO2 100 %.  LABORATORY DATA: Lab Results  Component Value Date   WBC 5.3 02/15/2016   HGB 9.6 (L) 02/15/2016   HCT 28.8 (L) 02/15/2016   MCV 96.6 02/15/2016   PLT 256 02/15/2016      Chemistry      Component Value Date/Time   NA 140 02/15/2016 1120   K 4.2 02/15/2016 1120   CL 107 07/03/2015 1200   CO2 25 02/15/2016 1120   BUN 18.1 02/15/2016 1120   CREATININE 0.8 02/15/2016 1120      Component Value Date/Time   CALCIUM 9.5 02/15/2016 1120   ALKPHOS 45 02/15/2016 1120   AST 19 02/15/2016 1120   ALT 22 02/15/2016 1120   BILITOT <0.30 02/15/2016 1120       RADIOGRAPHIC  STUDIES:  ASSESSMENT AND PLAN: This is a very pleasant 60 years old white female with stage IIIB non-small cell lung cancer currently undergoing a course of concurrent chemoradiation with weekly carboplatin and paclitaxel is status post 7 cycles and tolerating her treatment fairly well except for the radiation induced esophagitis as well as dry cough. She is currently undergoing systemic chemotherapy with carboplatin and Alimta status post 5 cycles and tolerating the treatment well. I recommended for her to proceed with cycle #6 of her chemotherapy today as a scheduled. She will come back for follow-up visit in 3 weeks for evaluation with repeat CT scan of the chest for restaging of her disease. She was advised to call immediately if she has any concerning symptoms in the interval. The patient voices understanding of current disease status and treatment options and is in agreement with the  current care plan.  All questions were answered. The patient knows to call the clinic with any problems, questions or concerns. We can certainly see the patient much sooner if necessary.  Disclaimer: This note was dictated with voice recognition software. Similar sounding words can inadvertently be transcribed and may not be corrected upon review.

## 2016-02-19 ENCOUNTER — Other Ambulatory Visit: Payer: Self-pay | Admitting: Internal Medicine

## 2016-02-29 ENCOUNTER — Encounter: Payer: Self-pay | Admitting: Medical Oncology

## 2016-02-29 ENCOUNTER — Telehealth: Payer: Self-pay

## 2016-02-29 NOTE — Telephone Encounter (Signed)
l cc Rose Figueroa.

## 2016-02-29 NOTE — Telephone Encounter (Signed)
Pt called b/c she received letter from insurance stating that they need more information to make sure the CT is medically necessary. She is having her CT on Friday at an external facility.  She received her barium solution but has no directions on how to take it.

## 2016-03-02 ENCOUNTER — Telehealth: Payer: Self-pay | Admitting: Medical Oncology

## 2016-03-02 ENCOUNTER — Other Ambulatory Visit: Payer: Self-pay | Admitting: Medical Oncology

## 2016-03-02 ENCOUNTER — Other Ambulatory Visit: Payer: Self-pay | Admitting: Pharmacist

## 2016-03-02 NOTE — Telephone Encounter (Signed)
Pt called again about scans, ABD/Pelvis not covered. Information sent to [facility].

## 2016-03-02 NOTE — Telephone Encounter (Signed)
Pt was told by her insurance that the CT chest was auth but not the abd and pelvis. Forwarded call to R. Hardrick.

## 2016-03-03 ENCOUNTER — Telehealth: Payer: Self-pay | Admitting: Medical Oncology

## 2016-03-03 ENCOUNTER — Other Ambulatory Visit (HOSPITAL_BASED_OUTPATIENT_CLINIC_OR_DEPARTMENT_OTHER): Payer: Managed Care, Other (non HMO)

## 2016-03-03 ENCOUNTER — Ambulatory Visit (HOSPITAL_BASED_OUTPATIENT_CLINIC_OR_DEPARTMENT_OTHER): Payer: Managed Care, Other (non HMO)

## 2016-03-03 DIAGNOSIS — C3411 Malignant neoplasm of upper lobe, right bronchus or lung: Secondary | ICD-10-CM

## 2016-03-03 DIAGNOSIS — Z95828 Presence of other vascular implants and grafts: Secondary | ICD-10-CM

## 2016-03-03 LAB — CBC WITH DIFFERENTIAL/PLATELET
BASO%: 0.6 % (ref 0.0–2.0)
Basophils Absolute: 0 10*3/uL (ref 0.0–0.1)
EOS%: 1.3 % (ref 0.0–7.0)
Eosinophils Absolute: 0 10*3/uL (ref 0.0–0.5)
HCT: 29.6 % — ABNORMAL LOW (ref 34.8–46.6)
HGB: 9.9 g/dL — ABNORMAL LOW (ref 11.6–15.9)
LYMPH#: 0.8 10*3/uL — AB (ref 0.9–3.3)
LYMPH%: 31.5 % (ref 14.0–49.7)
MCH: 33.6 pg (ref 25.1–34.0)
MCHC: 33.6 g/dL (ref 31.5–36.0)
MCV: 100.1 fL (ref 79.5–101.0)
MONO#: 0.5 10*3/uL (ref 0.1–0.9)
MONO%: 18.5 % — AB (ref 0.0–14.0)
NEUT#: 1.2 10*3/uL — ABNORMAL LOW (ref 1.5–6.5)
NEUT%: 48.1 % (ref 38.4–76.8)
Platelets: 192 10*3/uL (ref 145–400)
RBC: 2.95 10*6/uL — AB (ref 3.70–5.45)
RDW: 16.4 % — ABNORMAL HIGH (ref 11.2–14.5)
WBC: 2.5 10*3/uL — ABNORMAL LOW (ref 3.9–10.3)

## 2016-03-03 LAB — COMPREHENSIVE METABOLIC PANEL
ALT: 25 U/L (ref 0–55)
AST: 27 U/L (ref 5–34)
Albumin: 3.4 g/dL — ABNORMAL LOW (ref 3.5–5.0)
Alkaline Phosphatase: 49 U/L (ref 40–150)
Anion Gap: 6 mEq/L (ref 3–11)
BUN: 19.8 mg/dL (ref 7.0–26.0)
CHLORIDE: 106 meq/L (ref 98–109)
CO2: 28 meq/L (ref 22–29)
CREATININE: 0.8 mg/dL (ref 0.6–1.1)
Calcium: 9.4 mg/dL (ref 8.4–10.4)
EGFR: 75 mL/min/{1.73_m2} — ABNORMAL LOW (ref >90)
Glucose: 97 mg/dl (ref 70–140)
Potassium: 4.4 mEq/L (ref 3.5–5.1)
Sodium: 139 mEq/L (ref 136–145)
Total Bilirubin: <0.3 mg/dL (ref 0.20–1.20)
Total Protein: 6.5 g/dL (ref 6.4–8.3)

## 2016-03-03 MED ORDER — SODIUM CHLORIDE 0.9 % IJ SOLN
10.0000 mL | INTRAMUSCULAR | Status: DC | PRN
  Administered 2016-03-03: 10 mL via INTRAVENOUS
  Filled 2016-03-03: qty 10

## 2016-03-03 MED ORDER — HEPARIN SOD (PORK) LOCK FLUSH 100 UNIT/ML IV SOLN
500.0000 [IU] | Freq: Once | INTRAVENOUS | Status: AC | PRN
  Administered 2016-03-03: 500 [IU] via INTRAVENOUS
  Filled 2016-03-03: qty 5

## 2016-03-03 NOTE — Telephone Encounter (Signed)
I told her Rose Figueroa was not going to order CT scan abd/pelvis. She said she will cancel CT chest

## 2016-03-03 NOTE — Telephone Encounter (Signed)
I called [employer] and was told  CT pelvis was denied and they offered CT abd. - Case number is [id number].  I called [employer] automated line and was asked for tax id number . I then  called [facility] for tax ID number, then I called [employer] at [phone], and gave them tax id # for [facility]- tax id is [id number]. Auth number obrained for Ct abd is [id number]. I called this to [facility]. I notified pt.

## 2016-03-03 NOTE — Telephone Encounter (Signed)
Pt.notified

## 2016-03-04 ENCOUNTER — Telehealth: Payer: Self-pay | Admitting: Medical Oncology

## 2016-03-04 ENCOUNTER — Encounter: Payer: Self-pay | Admitting: *Deleted

## 2016-03-04 NOTE — Telephone Encounter (Signed)
I left a message on pts phone that her number showed up on my voice mail without a message.

## 2016-03-07 ENCOUNTER — Ambulatory Visit (HOSPITAL_BASED_OUTPATIENT_CLINIC_OR_DEPARTMENT_OTHER): Payer: Managed Care, Other (non HMO) | Admitting: Internal Medicine

## 2016-03-07 ENCOUNTER — Telehealth: Payer: Self-pay | Admitting: *Deleted

## 2016-03-07 ENCOUNTER — Encounter: Payer: Self-pay | Admitting: Internal Medicine

## 2016-03-07 ENCOUNTER — Telehealth: Payer: Self-pay | Admitting: Internal Medicine

## 2016-03-07 VITALS — BP 138/89 | HR 92 | Temp 98.5°F | Resp 18 | Ht 63.0 in | Wt 141.5 lb

## 2016-03-07 DIAGNOSIS — C3411 Malignant neoplasm of upper lobe, right bronchus or lung: Secondary | ICD-10-CM | POA: Diagnosis not present

## 2016-03-07 DIAGNOSIS — R0602 Shortness of breath: Secondary | ICD-10-CM | POA: Diagnosis not present

## 2016-03-07 DIAGNOSIS — Z95828 Presence of other vascular implants and grafts: Secondary | ICD-10-CM

## 2016-03-07 DIAGNOSIS — Z5111 Encounter for antineoplastic chemotherapy: Secondary | ICD-10-CM

## 2016-03-07 NOTE — Progress Notes (Signed)
.      Rose Figueroa Telephone:(336) [phone]   Fax:(336) [phone]  OFFICE PROGRESS NOTE  Corine Shelter, PA-C [address] [place] [address]  DIAGNOSIS: Stage IIIB (T2a, N3, M0) non-small cell lung cancer, adenocarcinoma with negative EGFR mutation and negative ALK gene translocation diagnosed in October 2016.  PRIOR THERAPY:  1) Concurrent chemoradiation with weekly carboplatin for AUC of 2 and paclitaxel 45 MG/M2 status post 6 cycles. Last dose was given 07/13/2015 with partial response. 2) Systemic chemotherapy with carboplatin for AUC of 5 and Alimta 500 MG/M2 every 3 weeks. First dose 11/02/2015. Status post 6 cycles.  CURRENT THERAPY: Observation.  INTERVAL HISTORY: Rose Figueroa 60 y.o. female returns to the clinic today for follow-up visit accompanied by her husband. The patient completed 6 cycles of systemic chemotherapy with carboplatin and Alimta and tolerating it fairly well with no significant adverse effects. She had swelling in the left side of her face and left arm 3 days after her chemotherapy but this resolved a day later. She is feeling much better today. She denied having any significant weight loss or night sweats. She denied having any chest pain or hemoptysis. She continues to have intermittent shortness breath as well as wheezing secondary to radiation induced pneumonitis. She has no significant nausea or vomiting, no fever or chills. She had repeat CT scan of the chest, abdomen and pelvis performed recently and she is here for evaluation and discussion of her scan results.  MEDICAL HISTORY: Past Medical History:  Diagnosis Date  . Anxiety   . Back pain    right back lateral  . BCC (basal cell carcinoma of skin)   . Colon polyps   . Complication of anesthesia    pt. states that she is difficult to arouse  . Complication of anesthesia    "indigestion and burping " after eating.  . Diverticulosis   . Dysrhythmia    "skip beat in early  20's"  . H/O cold sores   . History of anemia   . Lung cancer ([place])    RUL mass -being tx- radiation and chemo- remains last chemo 01-04-16  . Osteoporosis   . PONV (postoperative nausea and vomiting)   . Rectal bleeding   . right upper lobe lung mass 04/15/2015   malignant tumor found - 10'16-radiation and chemotherapy and remains with chemo at present- Dr. Earlie Server follows.    ALLERGIES:  has No Known Allergies.  MEDICATIONS:  Current Outpatient Prescriptions  Medication Sig Dispense Refill  . Biotin 5000 MCG CAPS Take 5,000 mcg by mouth daily.    . Calcium Carb-Cholecalciferol (CALCIUM 1000 + D PO) Take 5 tablets by mouth daily. D3 = 400 IU per each.    . cetirizine (ZYRTEC) 10 MG tablet Take 10 mg by mouth daily.     Marland Kitchen dexamethasone (DECADRON) 4 MG tablet 4 mg by mouth twice a day the day before, day of and day after the chemotherapy every 3 weeks 40 tablet 1  . folic acid (FOLVITE) 1 MG tablet TAKE 1 TABLET (1 MG TOTAL) BY MOUTH DAILY. 30 tablet 4  . L-LYSINE PO Take 3,000 mg by mouth daily.     Marland Kitchen lidocaine-prilocaine (EMLA) cream Apply 1 application topically as needed. 30 g 0  . Melatonin 3 MG CAPS Take 3 mg by mouth once.    . Misc Natural Products (TART CHERRY ADVANCED PO) Take 1,200 mg by mouth daily.    . Multiple Vitamins-Minerals (CENTRUM SILVER  PO) Take 1 tablet by mouth daily.    . Omega-3 Fatty Acids (RA FISH OIL) 1400 MG CPDR Take 2 capsules by mouth daily.     . Turmeric Curcumin 500 MG CAPS Take 1 each by mouth daily.     . vitamin B-12 (CYANOCOBALAMIN) 1000 MCG tablet Take 1,000 mcg by mouth daily.    Marland Kitchen MOVIPREP 100 g SOLR      No current facility-administered medications for this visit.     SURGICAL HISTORY:  Past Surgical History:  Procedure Laterality Date  . COLONOSCOPY    . COLONOSCOPY W/ POLYPECTOMY    . COLONOSCOPY WITH PROPOFOL N/A 01/20/2016   Procedure: COLONOSCOPY WITH PROPOFOL;  Surgeon: Arta Silence, MD;  Location: [facility];  Service:  Endoscopy;  Laterality: N/A;  . CYSTECTOMY     right breast x 2  . MEDIASTINOSCOPY N/A 05/07/2015   Procedure: MEDIASTINOSCOPY;  Surgeon: Ivin Poot, MD;  Location: [place];  Service: Thoracic;  Laterality: N/A;  . TONSILLECTOMY    . VIDEO BRONCHOSCOPY WITH ENDOBRONCHIAL ULTRASOUND N/A 05/07/2015   Procedure: VIDEO BRONCHOSCOPY WITH ENDOBRONCHIAL ULTRASOUND;  Surgeon: Ivin Poot, MD;  Location: [facility];  Service: Thoracic;  Laterality: N/A;    REVIEW OF SYSTEMS:  Constitutional: positive for fatigue Eyes: negative Ears, nose, mouth, throat, and face: negative Respiratory: positive for dyspnea on exertion and wheezing Cardiovascular: negative Gastrointestinal: negative Genitourinary:negative Integument/breast: negative Hematologic/lymphatic: negative Musculoskeletal:negative Neurological: negative Behavioral/Psych: negative Endocrine: negative Allergic/Immunologic: negative   PHYSICAL EXAMINATION: General appearance: alert, cooperative, fatigued and no distress Head: Normocephalic, without obvious abnormality, atraumatic Neck: no adenopathy, no JVD, supple, symmetrical, trachea midline and thyroid not enlarged, symmetric, no tenderness/mass/nodules Lymph nodes: Cervical, supraclavicular, and axillary nodes normal. Resp: clear to auscultation bilaterally Back: symmetric, no curvature. ROM normal. No CVA tenderness. Cardio: regular rate and rhythm, S1, S2 normal, no murmur, click, rub or gallop GI: soft, non-tender; bowel sounds normal; no masses,  no organomegaly Extremities: extremities normal, atraumatic, no cyanosis or edema Neurologic: Alert and oriented X 3, normal strength and tone. Normal symmetric reflexes. Normal coordination and gait  ECOG PERFORMANCE STATUS: 1 - Symptomatic but completely ambulatory  Blood pressure 138/89, pulse 92, temperature 98.5 F (36.9 C), temperature source Oral, resp. rate 18, height 5' 3"  (1.6 m), weight 141 lb 8 oz (64.2 kg), SpO2 99  %.  LABORATORY DATA: Lab Results  Component Value Date   WBC 2.5 (L) 03/03/2016   HGB 9.9 (L) 03/03/2016   HCT 29.6 (L) 03/03/2016   MCV 100.1 03/03/2016   PLT 192 03/03/2016      Chemistry      Component Value Date/Time   NA 139 03/03/2016 1131   K 4.4 03/03/2016 1131   CL 107 07/03/2015 1200   CO2 28 03/03/2016 1131   BUN 19.8 03/03/2016 1131   CREATININE 0.8 03/03/2016 1131      Component Value Date/Time   CALCIUM 9.4 03/03/2016 1131   ALKPHOS 49 03/03/2016 1131   AST 27 03/03/2016 1131   ALT 25 03/03/2016 1131   BILITOT <0.30 03/03/2016 1131       RADIOGRAPHIC STUDIES:  ASSESSMENT AND PLAN: This is a very pleasant 60 years old white female with stage IIIB non-small cell lung cancer currently undergoing a course of concurrent chemoradiation with weekly carboplatin and paclitaxel is status post 7 cycles and tolerating her treatment fairly well except for the radiation induced esophagitis as well as dry cough. The patient completed 6 cycles of systemic chemotherapy was  carboplatin and Alimta and tolerating it fairly well except for fatigue. The recent CT scan of the chest, abdomen and pelvis performed on 03/04/2016 showed radiation changes medially in the right hemithorax with volume loss and paramediastinal fibrosis but no residual right upper lobe mass or adenopathy demonstrated. There was no evidence of metastatic disease. I discussed the scan results with the patient and her husband. I recommended for her to continue on observation with repeat CT scan of the chest in 3 months for restaging of her disease. For the shortness of breath and wheezing the patient will continue with her inhaler for now. If no improvement, I may consider the patient for short course of steroids. She was advised to call immediately if she has any concerning symptoms in the interval. The patient voices understanding of current disease status and treatment options and is in agreement with the  current care plan.  All questions were answered. The patient knows to call the clinic with any problems, questions or concerns. We can certainly see the patient much sooner if necessary.  Disclaimer: This note was dictated with voice recognition software. Similar sounding words can inadvertently be transcribed and may not be corrected upon review.

## 2016-03-07 NOTE — Telephone Encounter (Signed)
GAVE PATIENT AVS REPORT AND APPOINTMENTS FOR September AND November. [facility] WILL CALL RE CT  PATIENT AWARE.

## 2016-03-07 NOTE — Telephone Encounter (Signed)
Per husband request I have printed a copy of last weeks lab results.

## 2016-03-10 ENCOUNTER — Telehealth: Payer: Self-pay | Admitting: Internal Medicine

## 2016-03-10 NOTE — Telephone Encounter (Signed)
PATIENT CALLED TO RESCHEDULE APPT. 03/10/16

## 2016-03-11 ENCOUNTER — Telehealth: Payer: Self-pay | Admitting: Internal Medicine

## 2016-03-11 NOTE — Telephone Encounter (Signed)
Lvm advising appt 9/21 moved to 9/22 @ 9.30am.

## 2016-04-15 ENCOUNTER — Ambulatory Visit (HOSPITAL_BASED_OUTPATIENT_CLINIC_OR_DEPARTMENT_OTHER): Payer: Managed Care, Other (non HMO)

## 2016-04-15 DIAGNOSIS — C3411 Malignant neoplasm of upper lobe, right bronchus or lung: Secondary | ICD-10-CM

## 2016-04-15 DIAGNOSIS — Z95828 Presence of other vascular implants and grafts: Secondary | ICD-10-CM

## 2016-04-15 DIAGNOSIS — Z452 Encounter for adjustment and management of vascular access device: Secondary | ICD-10-CM

## 2016-04-15 MED ORDER — SODIUM CHLORIDE 0.9 % IJ SOLN
10.0000 mL | INTRAMUSCULAR | Status: DC | PRN
  Administered 2016-04-15: 10 mL via INTRAVENOUS
  Filled 2016-04-15: qty 10

## 2016-04-15 MED ORDER — HEPARIN SOD (PORK) LOCK FLUSH 100 UNIT/ML IV SOLN
500.0000 [IU] | Freq: Once | INTRAVENOUS | Status: AC | PRN
  Administered 2016-04-15: 500 [IU] via INTRAVENOUS
  Filled 2016-04-15: qty 5

## 2016-05-30 ENCOUNTER — Ambulatory Visit (HOSPITAL_BASED_OUTPATIENT_CLINIC_OR_DEPARTMENT_OTHER): Payer: Managed Care, Other (non HMO)

## 2016-05-30 ENCOUNTER — Other Ambulatory Visit (HOSPITAL_BASED_OUTPATIENT_CLINIC_OR_DEPARTMENT_OTHER): Payer: Managed Care, Other (non HMO)

## 2016-05-30 ENCOUNTER — Ambulatory Visit (HOSPITAL_COMMUNITY)
Admission: RE | Admit: 2016-05-30 | Discharge: 2016-05-30 | Disposition: A | Discharge status: Home / Self Care | Payer: Managed Care, Other (non HMO) | Source: Ambulatory Visit | Attending: Internal Medicine | Admitting: Internal Medicine

## 2016-05-30 DIAGNOSIS — C3411 Malignant neoplasm of upper lobe, right bronchus or lung: Secondary | ICD-10-CM

## 2016-05-30 DIAGNOSIS — Z95828 Presence of other vascular implants and grafts: Secondary | ICD-10-CM

## 2016-05-30 DIAGNOSIS — Z08 Encounter for follow-up examination after completed treatment for malignant neoplasm: Secondary | ICD-10-CM | POA: Insufficient documentation

## 2016-05-30 DIAGNOSIS — Z5111 Encounter for antineoplastic chemotherapy: Secondary | ICD-10-CM

## 2016-05-30 DIAGNOSIS — Z85118 Personal history of other malignant neoplasm of bronchus and lung: Secondary | ICD-10-CM | POA: Diagnosis not present

## 2016-05-30 LAB — CBC WITH DIFFERENTIAL/PLATELET
BASO%: 1.1 % (ref 0.0–2.0)
BASOS ABS: 0 10*3/uL (ref 0.0–0.1)
EOS ABS: 0.1 10*3/uL (ref 0.0–0.5)
EOS%: 2.7 % (ref 0.0–7.0)
HCT: 40.6 % (ref 34.8–46.6)
HEMOGLOBIN: 13.3 g/dL (ref 11.6–15.9)
LYMPH%: 40.3 % (ref 14.0–49.7)
MCH: 31.3 pg (ref 25.1–34.0)
MCHC: 32.9 g/dL (ref 31.5–36.0)
MCV: 95.2 fL (ref 79.5–101.0)
MONO#: 0.3 10*3/uL (ref 0.1–0.9)
MONO%: 11.2 % (ref 0.0–14.0)
NEUT%: 44.7 % (ref 38.4–76.8)
NEUTROS ABS: 1.3 10*3/uL — AB (ref 1.5–6.5)
PLATELETS: 231 10*3/uL (ref 145–400)
RBC: 4.26 10*6/uL (ref 3.70–5.45)
RDW: 13.4 % (ref 11.2–14.5)
WBC: 2.8 10*3/uL — AB (ref 3.9–10.3)
lymph#: 1.1 10*3/uL (ref 0.9–3.3)

## 2016-05-30 LAB — COMPREHENSIVE METABOLIC PANEL
ALBUMIN: 3.8 g/dL (ref 3.5–5.0)
ALK PHOS: 39 U/L — AB (ref 40–150)
ALT: 16 U/L (ref 0–55)
ANION GAP: 7 meq/L (ref 3–11)
AST: 20 U/L (ref 5–34)
BILIRUBIN TOTAL: 0.32 mg/dL (ref 0.20–1.20)
BUN: 15.3 mg/dL (ref 7.0–26.0)
CO2: 26 mEq/L (ref 22–29)
Calcium: 9.2 mg/dL (ref 8.4–10.4)
Chloride: 107 mEq/L (ref 98–109)
Creatinine: 0.8 mg/dL (ref 0.6–1.1)
EGFR: 80 mL/min/{1.73_m2} — AB (ref >90)
Glucose: 91 mg/dl (ref 70–140)
POTASSIUM: 4.4 meq/L (ref 3.5–5.1)
SODIUM: 140 meq/L (ref 136–145)
TOTAL PROTEIN: 6.7 g/dL (ref 6.4–8.3)

## 2016-05-30 MED ORDER — SODIUM CHLORIDE 0.9 % IJ SOLN
10.0000 mL | INTRAMUSCULAR | Status: DC | PRN
  Administered 2016-05-30: 10 mL via INTRAVENOUS
  Filled 2016-05-30: qty 10

## 2016-05-30 MED ORDER — IOPAMIDOL (ISOVUE-300) INJECTION 61%
75.0000 mL | Freq: Once | INTRAVENOUS | Status: AC | PRN
  Administered 2016-05-30: 75 mL via INTRAVENOUS

## 2016-06-06 ENCOUNTER — Telehealth: Payer: Self-pay | Admitting: Internal Medicine

## 2016-06-06 ENCOUNTER — Encounter: Payer: Self-pay | Admitting: Internal Medicine

## 2016-06-06 ENCOUNTER — Ambulatory Visit (HOSPITAL_BASED_OUTPATIENT_CLINIC_OR_DEPARTMENT_OTHER): Payer: Managed Care, Other (non HMO) | Admitting: Internal Medicine

## 2016-06-06 VITALS — BP 143/102 | HR 81 | Temp 98.0°F | Resp 18 | Ht 63.0 in | Wt 141.0 lb

## 2016-06-06 DIAGNOSIS — C3411 Malignant neoplasm of upper lobe, right bronchus or lung: Secondary | ICD-10-CM

## 2016-06-06 DIAGNOSIS — K208 Other esophagitis: Secondary | ICD-10-CM | POA: Diagnosis not present

## 2016-06-06 DIAGNOSIS — R5383 Other fatigue: Secondary | ICD-10-CM | POA: Diagnosis not present

## 2016-06-06 DIAGNOSIS — Z5111 Encounter for antineoplastic chemotherapy: Secondary | ICD-10-CM

## 2016-06-06 NOTE — Telephone Encounter (Signed)
Patient bypassed Scheduling area. Checked out from [employer]. Follow up appointment and labs was scheduled, per 06/06/16 los. Appointment letter and schedule, mailed to patient.

## 2016-06-06 NOTE — Progress Notes (Signed)
.      [place] Telephone:(336) [phone]   Fax:(336) [phone]  OFFICE PROGRESS NOTE  Corine Shelter, PA-C [address] [place] [address]  DIAGNOSIS: Stage IIIB (T2a, N3, M0) non-small cell lung cancer, adenocarcinoma with negative EGFR mutation and negative ALK gene translocation diagnosed in October 2016.  PRIOR THERAPY:  1) Concurrent chemoradiation with weekly carboplatin for AUC of 2 and paclitaxel 45 MG/M2 status post 6 cycles. Last dose was given 07/13/2015 with partial response. 2) Systemic chemotherapy with carboplatin for AUC of 5 and Alimta 500 MG/M2 every 3 weeks. First dose 11/02/2015. Status post 6 cycles.  CURRENT THERAPY: Observation.  INTERVAL HISTORY: Rose Figueroa 60 y.o. female returns to the clinic today for follow-up visit accompanied by her husband. The patient is currently on observation. She is feeling fine with no specific complaints. She continues to have mild cough. She denied having any significant shortness of breath, chest pain or hemoptysis. She has no significant weight loss or night sweats. She has no nausea or vomiting, no fever or chills. She had repeat CT scan of the chest performed recently and she is here for evaluation and discussion of her scan results.  MEDICAL HISTORY: Past Medical History:  Diagnosis Date  . Anxiety   . Back pain    right back lateral  . BCC (basal cell carcinoma of skin)   . Colon polyps   . Complication of anesthesia    pt. states that she is difficult to arouse  . Complication of anesthesia    "indigestion and burping " after eating.  . Diverticulosis   . Dysrhythmia    "skip beat in early 20's"  . H/O cold sores   . History of anemia   . Lung cancer ([place])    RUL mass -being tx- radiation and chemo- remains last chemo 01-04-16  . Osteoporosis   . PONV (postoperative nausea and vomiting)   . Rectal bleeding   . right upper lobe lung mass 04/15/2015   malignant tumor found -  10'16-radiation and chemotherapy and remains with chemo at present- Dr. Earlie Server follows.    ALLERGIES:  has No Known Allergies.  MEDICATIONS:  Current Outpatient Prescriptions  Medication Sig Dispense Refill  . Biotin 5000 MCG CAPS Take 5,000 mcg by mouth daily.    . Calcium Carb-Cholecalciferol (CALCIUM 1000 + D PO) Take 5 tablets by mouth daily. D3 = 400 IU per each.    . cetirizine (ZYRTEC) 10 MG tablet Take 10 mg by mouth daily.     Marland Kitchen dexamethasone (DECADRON) 4 MG tablet 4 mg by mouth twice a day the day before, day of and day after the chemotherapy every 3 weeks 40 tablet 1  . folic acid (FOLVITE) 1 MG tablet TAKE 1 TABLET (1 MG TOTAL) BY MOUTH DAILY. 30 tablet 4  . L-LYSINE PO Take 3,000 mg by mouth daily.     Marland Kitchen lidocaine-prilocaine (EMLA) cream Apply 1 application topically as needed. 30 g 0  . Melatonin 3 MG CAPS Take 3 mg by mouth once.    . Misc Natural Products (TART CHERRY ADVANCED PO) Take 1,200 mg by mouth daily.    Marland Kitchen MOVIPREP 100 g SOLR     . Multiple Vitamins-Minerals (CENTRUM SILVER PO) Take 1 tablet by mouth daily.    . Omega-3 Fatty Acids (RA FISH OIL) 1400 MG CPDR Take 2 capsules by mouth daily.     . Turmeric Curcumin 500 MG CAPS Take 1 each by  mouth daily.     . vitamin B-12 (CYANOCOBALAMIN) 1000 MCG tablet Take 1,000 mcg by mouth daily.     No current facility-administered medications for this visit.     SURGICAL HISTORY:  Past Surgical History:  Procedure Laterality Date  . COLONOSCOPY    . COLONOSCOPY W/ POLYPECTOMY    . COLONOSCOPY WITH PROPOFOL N/A 01/20/2016   Procedure: COLONOSCOPY WITH PROPOFOL;  Surgeon: Arta Silence, MD;  [facility];  Service: Endoscopy;  Laterality: N/A;  . CYSTECTOMY     right breast x 2  . MEDIASTINOSCOPY N/A 05/07/2015   Procedure: MEDIASTINOSCOPY;  Surgeon: Ivin Poot, MD;  Location: [place] [facility];  Service: Thoracic;  Laterality: N/A;  . TONSILLECTOMY    . VIDEO BRONCHOSCOPY WITH ENDOBRONCHIAL ULTRASOUND N/A  05/07/2015   Procedure: VIDEO BRONCHOSCOPY WITH ENDOBRONCHIAL ULTRASOUND;  Surgeon: Ivin Poot, MD;  [facility];  Service: Thoracic;  Laterality: N/A;    REVIEW OF SYSTEMS:  A comprehensive review of systems was negative except for: Respiratory: positive for cough   PHYSICAL EXAMINATION: General appearance: alert, cooperative, fatigued and no distress Head: Normocephalic, without obvious abnormality, atraumatic Neck: no adenopathy, no JVD, supple, symmetrical, trachea midline and thyroid not enlarged, symmetric, no tenderness/mass/nodules Lymph nodes: Cervical, supraclavicular, and axillary nodes normal. Resp: clear to auscultation bilaterally Back: symmetric, no curvature. ROM normal. No CVA tenderness. Cardio: regular rate and rhythm, S1, S2 normal, no murmur, click, rub or gallop GI: soft, non-tender; bowel sounds normal; no masses,  no organomegaly Extremities: extremities normal, atraumatic, no cyanosis or edema Neurologic: Alert and oriented X 3, normal strength and tone. Normal symmetric reflexes. Normal coordination and gait  ECOG PERFORMANCE STATUS: 1 - Symptomatic but completely ambulatory  Blood pressure (!) 143/102, pulse 81, temperature 98 F (36.7 C), temperature source Oral, resp. rate 18, height _0  (1.6 m), weight 141 lb (64 kg), SpO2 100 %.  LABORATORY DATA: Lab Results  Component Value Date   WBC 2.8 (L) 05/30/2016   HGB 13.3 05/30/2016   HCT 40.6 05/30/2016   MCV 95.2 05/30/2016   PLT 231 05/30/2016      Chemistry      Component Value Date/Time   NA 140 05/30/2016 0813   K 4.4 05/30/2016 0813   CL 107 07/03/2015 1200   CO2 26 05/30/2016 0813   BUN 15.3 05/30/2016 0813   CREATININE 0.8 05/30/2016 0813      Component Value Date/Time   CALCIUM 9.2 05/30/2016 0813   ALKPHOS 39 (L) 05/30/2016 0813   AST 20 05/30/2016 0813   ALT 16 05/30/2016 0813   BILITOT 0.32 05/30/2016 0813       RADIOGRAPHIC STUDIES: Ct Chest W Contrast  Result  Date: 05/30/2016 CLINICAL DATA:  Lung cancer restaging.  Right upper lobe lesion. EXAM: CT CHEST WITH CONTRAST TECHNIQUE: Multidetector CT imaging of the chest was performed during intravenous contrast administration. CONTRAST:  15m ISOVUE-300 IOPAMIDOL (ISOVUE-300) INJECTION 61% COMPARISON:  12/31/2015 FINDINGS: Cardiovascular: The heart size is normal. Trace pericardial effusion is new in the interval. Superior pericardial recess fluid stable since prior. Left Port-A-Cath tip position in the distal SVC. Mediastinum/Nodes: No mediastinal lymphadenopathy. 9 mm short axis left supraclavicular lymph node measured previously is now 7 mm. Stable 10 mm left thyroid nodule. There is no axillary lymphadenopathy. Lungs/Pleura: Post radiation scarring noted in the right hilum, extending into the central right lower lobe posteriorly as before. Stable volume loss right hemi thorax. No suspicious pulmonary nodule or mass in either lung.  No focal airspace consolidation. No pulmonary edema or pleural effusion. Upper Abdomen: Thickening left adrenal gland is unchanged without adrenal nodule or mass. Tiny hypodensity subcapsular right liver too small to characterize but unchanged and likely a cyst. Musculoskeletal: Bone windows reveal no worrisome lytic or sclerotic osseous lesions. IMPRESSION: 1. Stable exam.  No findings to suggest disease progression. 2. Postradiation scarring in the medial right lung with associated volume loss. 3. Trace pericardial fluid, new since prior study. Electronically Signed   By: Misty Stanley M.D.   On: 05/30/2016 11:10    ASSESSMENT AND PLAN: This is a very pleasant 60 years old white female with stage IIIB non-small cell lung cancer currently undergoing a course of concurrent chemoradiation with weekly carboplatin and paclitaxel is status post 7 cycles and tolerating her treatment fairly well except for the radiation induced esophagitis as well as dry cough. The patient completed 6 cycles of  systemic chemotherapy was carboplatin and Alimta and tolerating it fairly well except for fatigue. The patient is currently on observation and the recent CT scan of the chest showed no evidence for disease progression. I discussed the scan results with the patient and her husband. I recommended for her to continue on observation with repeat CT scan of the chest in 4 months for restaging of her disease. She was advised to call immediately if she has any concerning symptoms in the interval. The patient voices understanding of current disease status and treatment options and is in agreement with the current care plan.  All questions were answered. The patient knows to call the clinic with any problems, questions or concerns. We can certainly see the patient much sooner if necessary.  Disclaimer: This note was dictated with voice recognition software. Similar sounding words can inadvertently be transcribed and may not be corrected upon review.

## 2016-06-08 ENCOUNTER — Telehealth: Payer: Self-pay | Admitting: Internal Medicine

## 2016-06-08 NOTE — Telephone Encounter (Signed)
Patient called to have flush appointment scheduled for mid January instead of in first week of December. Per the patient she will not come in December it would be too early for an appointment schedule for January.

## 2016-06-13 ENCOUNTER — Telehealth: Payer: Self-pay | Admitting: Medical Oncology

## 2016-06-13 NOTE — Telephone Encounter (Signed)
Flush appt changed pt notified.

## 2016-07-16 ENCOUNTER — Other Ambulatory Visit: Payer: Self-pay | Admitting: Internal Medicine

## 2016-08-01 ENCOUNTER — Other Ambulatory Visit: Payer: Self-pay | Admitting: Internal Medicine

## 2016-08-01 ENCOUNTER — Telehealth: Payer: Self-pay | Admitting: *Deleted

## 2016-08-01 NOTE — Telephone Encounter (Signed)
Per patient request I have scheduled a flush appt for tomorrow. Patinet aware of appt

## 2016-08-02 ENCOUNTER — Ambulatory Visit (HOSPITAL_BASED_OUTPATIENT_CLINIC_OR_DEPARTMENT_OTHER): Payer: Managed Care, Other (non HMO)

## 2016-08-02 VITALS — BP 139/88 | HR 91 | Temp 98.1°F | Resp 18

## 2016-08-02 DIAGNOSIS — Z452 Encounter for adjustment and management of vascular access device: Secondary | ICD-10-CM

## 2016-08-02 DIAGNOSIS — Z95828 Presence of other vascular implants and grafts: Secondary | ICD-10-CM

## 2016-08-02 DIAGNOSIS — C3411 Malignant neoplasm of upper lobe, right bronchus or lung: Secondary | ICD-10-CM | POA: Diagnosis not present

## 2016-08-02 MED ORDER — SODIUM CHLORIDE 0.9 % IJ SOLN
10.0000 mL | INTRAMUSCULAR | Status: DC | PRN
  Administered 2016-08-02: 10 mL via INTRAVENOUS
  Filled 2016-08-02: qty 10

## 2016-08-02 MED ORDER — HEPARIN SOD (PORK) LOCK FLUSH 100 UNIT/ML IV SOLN
500.0000 [IU] | Freq: Once | INTRAVENOUS | Status: AC | PRN
  Administered 2016-08-02: 500 [IU] via INTRAVENOUS
  Filled 2016-08-02: qty 5

## 2016-08-11 ENCOUNTER — Telehealth: Payer: Self-pay | Admitting: Internal Medicine

## 2016-08-11 NOTE — Telephone Encounter (Signed)
Appointment canceled per patient request

## 2016-09-12 DIAGNOSIS — Z736 Limitation of activities due to disability: Secondary | ICD-10-CM

## 2016-09-20 ENCOUNTER — Telehealth: Payer: Self-pay | Admitting: *Deleted

## 2016-09-20 NOTE — Telephone Encounter (Signed)
"  I spoke with Radiology scheduling.  My scans are not authorized yet.  I ned to get this scheduled before I see Dr. Julien Nordmann 10-03-2016.  I'm going out of town today, returning 09-28-2016.  Would like to have th is done on March 9th.  I can be reached via mobile number [phone]."  Scans still are not authorized.  Will notify diagnostic precert.

## 2016-09-21 ENCOUNTER — Other Ambulatory Visit: Payer: Self-pay | Admitting: Medical Oncology

## 2016-09-21 ENCOUNTER — Telehealth: Payer: Self-pay | Admitting: Medical Oncology

## 2016-09-21 NOTE — Telephone Encounter (Signed)
Ct [facility] to [facility]

## 2016-09-29 ENCOUNTER — Other Ambulatory Visit: Payer: Self-pay | Admitting: *Deleted

## 2016-09-29 DIAGNOSIS — C3411 Malignant neoplasm of upper lobe, right bronchus or lung: Secondary | ICD-10-CM

## 2016-09-30 ENCOUNTER — Ambulatory Visit (HOSPITAL_BASED_OUTPATIENT_CLINIC_OR_DEPARTMENT_OTHER): Payer: Managed Care, Other (non HMO)

## 2016-09-30 ENCOUNTER — Other Ambulatory Visit (HOSPITAL_BASED_OUTPATIENT_CLINIC_OR_DEPARTMENT_OTHER): Payer: Managed Care, Other (non HMO)

## 2016-09-30 ENCOUNTER — Other Ambulatory Visit: Payer: Self-pay | Admitting: *Deleted

## 2016-09-30 DIAGNOSIS — C3411 Malignant neoplasm of upper lobe, right bronchus or lung: Secondary | ICD-10-CM

## 2016-09-30 DIAGNOSIS — Z5111 Encounter for antineoplastic chemotherapy: Secondary | ICD-10-CM

## 2016-09-30 LAB — COMPREHENSIVE METABOLIC PANEL
ALBUMIN: 3.9 g/dL (ref 3.5–5.0)
ALK PHOS: 37 U/L — AB (ref 40–150)
ALT: 19 U/L (ref 0–55)
ANION GAP: 7 meq/L (ref 3–11)
AST: 22 U/L (ref 5–34)
BUN: 16.2 mg/dL (ref 7.0–26.0)
CALCIUM: 9.3 mg/dL (ref 8.4–10.4)
CO2: 26 mEq/L (ref 22–29)
Chloride: 108 mEq/L (ref 98–109)
Creatinine: 0.8 mg/dL (ref 0.6–1.1)
EGFR: 85 mL/min/{1.73_m2} — AB (ref >90)
Glucose: 77 mg/dl (ref 70–140)
POTASSIUM: 4.1 meq/L (ref 3.5–5.1)
Sodium: 140 mEq/L (ref 136–145)
Total Bilirubin: 0.43 mg/dL (ref 0.20–1.20)
Total Protein: 6.6 g/dL (ref 6.4–8.3)

## 2016-09-30 LAB — CBC WITH DIFFERENTIAL/PLATELET
BASO%: 0.8 % (ref 0.0–2.0)
Basophils Absolute: 0 10*3/uL (ref 0.0–0.1)
EOS%: 3.2 % (ref 0.0–7.0)
Eosinophils Absolute: 0.1 10*3/uL (ref 0.0–0.5)
HCT: 38.5 % (ref 34.8–46.6)
HEMOGLOBIN: 12.9 g/dL (ref 11.6–15.9)
LYMPH%: 35.6 % (ref 14.0–49.7)
MCH: 31 pg (ref 25.1–34.0)
MCHC: 33.4 g/dL (ref 31.5–36.0)
MCV: 92.8 fL (ref 79.5–101.0)
MONO#: 0.3 10*3/uL (ref 0.1–0.9)
MONO%: 11 % (ref 0.0–14.0)
NEUT%: 49.4 % (ref 38.4–76.8)
NEUTROS ABS: 1.5 10*3/uL (ref 1.5–6.5)
PLATELETS: 250 10*3/uL (ref 145–400)
RBC: 4.15 10*6/uL (ref 3.70–5.45)
RDW: 14.4 % (ref 11.2–14.5)
WBC: 3 10*3/uL — AB (ref 3.9–10.3)
lymph#: 1.1 10*3/uL (ref 0.9–3.3)

## 2016-09-30 MED ORDER — HEPARIN SOD (PORK) LOCK FLUSH 100 UNIT/ML IV SOLN
500.0000 [IU] | Freq: Once | INTRAVENOUS | Status: AC
  Administered 2016-09-30: 500 [IU]
  Filled 2016-09-30: qty 5

## 2016-09-30 MED ORDER — SODIUM CHLORIDE 0.9 % IJ SOLN
10.0000 mL | Freq: Once | INTRAMUSCULAR | Status: AC
  Administered 2016-09-30: 10 mL
  Filled 2016-09-30: qty 10

## 2016-10-01 IMAGING — CR DG CHEST 2V
2 series · 2 of 2 positions shown · non-contrast
Comparison: 04/03/2015

CLINICAL DATA: Preop chest.

These results will be called to the ordering clinician or
representative by the [HOSPITAL] at the imaging location.
EXAM:
CHEST  2 VIEW

[w chest pa]
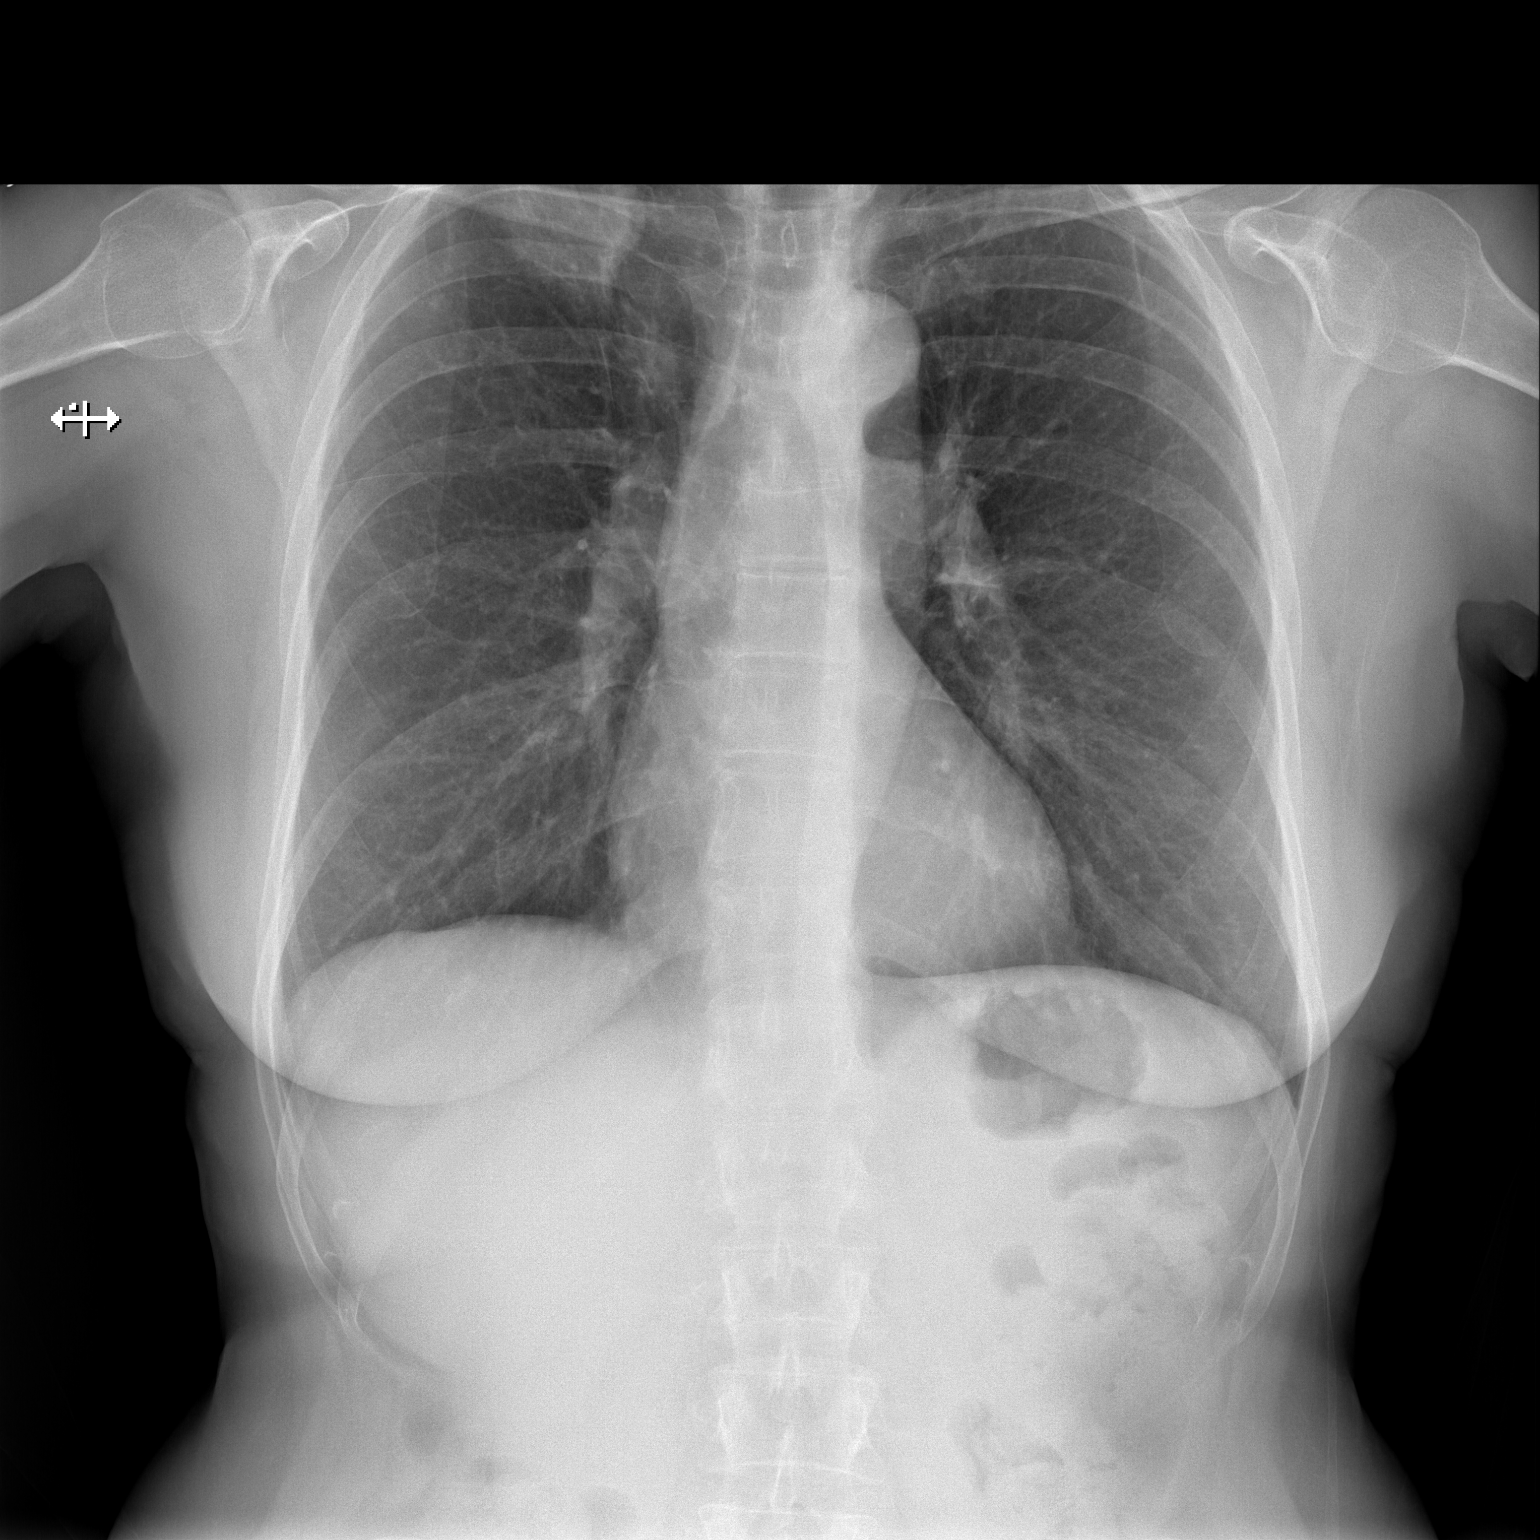

[w chest lat]
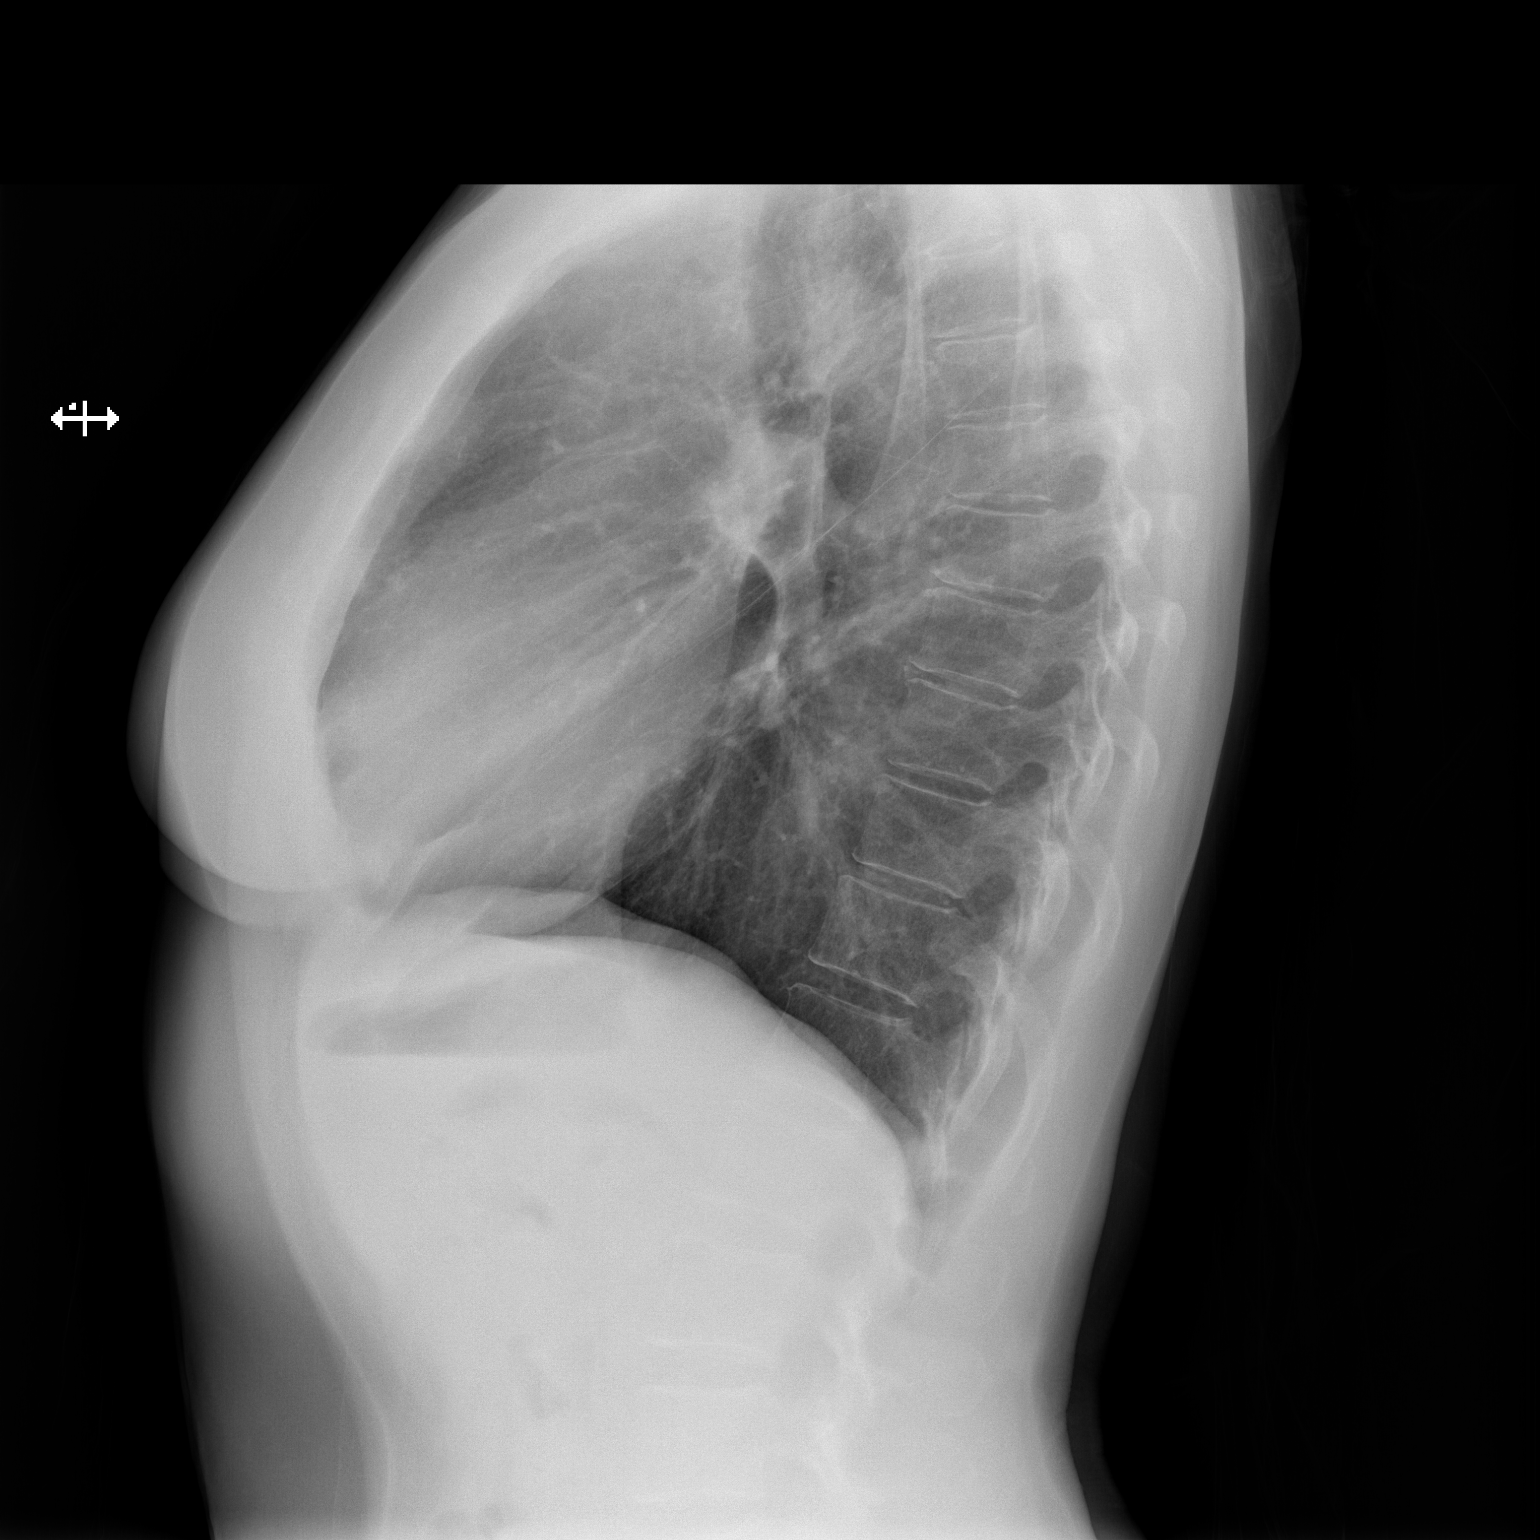

[2 of 2 positions shown; findings below may reference images not displayed]

FINDINGS: Right upper lobe mass without appreciable change. Normal heart size
and stable mediastinal contours. There is no edema, consolidation,
effusion, or pneumothorax. No acute osseous findings.
IMPRESSION: 1. No evidence of acute cardiopulmonary disease.
2. Known right upper lobe mass.

## 2016-10-03 ENCOUNTER — Ambulatory Visit (HOSPITAL_BASED_OUTPATIENT_CLINIC_OR_DEPARTMENT_OTHER): Payer: Managed Care, Other (non HMO) | Admitting: Internal Medicine

## 2016-10-03 ENCOUNTER — Encounter: Payer: Self-pay | Admitting: Internal Medicine

## 2016-10-03 VITALS — BP 127/97 | HR 103 | Temp 97.7°F | Resp 18 | Wt 140.5 lb

## 2016-10-03 DIAGNOSIS — M810 Age-related osteoporosis without current pathological fracture: Secondary | ICD-10-CM | POA: Diagnosis not present

## 2016-10-03 DIAGNOSIS — C3411 Malignant neoplasm of upper lobe, right bronchus or lung: Secondary | ICD-10-CM

## 2016-10-03 NOTE — Progress Notes (Signed)
.      [place] [phone]   [phone]  OFFICE PROGRESS NOTE  Corine Shelter, PA-C [address] [place] [address]  DIAGNOSIS: Stage IIIB (T2a, N3, M0) non-small cell lung cancer, adenocarcinoma with negative EGFR mutation and negative ALK gene translocation diagnosed in October 2016.  PRIOR THERAPY:  1) Concurrent chemoradiation with weekly carboplatin for AUC of 2 and paclitaxel 45 MG/M2 status post 6 cycles. Last dose was given 07/13/2015 with partial response. 2) Systemic chemotherapy with carboplatin for AUC of 5 and Alimta 500 MG/M2 every 3 weeks. First dose 11/02/2015. Status post 6 cycles.  CURRENT THERAPY: Observation.  INTERVAL HISTORY: Lizmarie Witters 61 y.o. female came to the clinic today for follow-up visit accompanied by her husband. The patient is feeling well today with no specific complaints. She has been observation since July 2017 and she is feeling well. She denied having any chest pain, shortness of breath, cough or hemoptysis. She has no fever or chills. She has no nausea, vomiting, diarrhea or constipation. She denied having any significant weight loss or night sweats. She is here today for reevaluation with repeat CT scan of the chest.  MEDICAL HISTORY: Past Medical History:  Diagnosis Date  . Anxiety   . Back pain    right back lateral  . BCC (basal cell carcinoma of skin)   . Colon polyps   . Complication of anesthesia    pt. states that she is difficult to arouse  . Complication of anesthesia    "indigestion and burping " after eating.  . Diverticulosis   . Dysrhythmia    "skip beat in early 20's"  . H/O cold sores   . History of anemia   . Lung cancer ([place])    RUL mass -being tx- radiation and chemo- remains last chemo 01-04-16  . Osteoporosis   . PONV (postoperative nausea and vomiting)   . Rectal bleeding   . right upper lobe lung mass 04/15/2015   malignant tumor found - 10'16-radiation and  chemotherapy and remains with chemo at present- Dr. Earlie Server follows.    ALLERGIES:  has No Known Allergies.  MEDICATIONS:  Current Outpatient Prescriptions  Medication Sig Dispense Refill  . Biotin 5000 MCG CAPS Take 5,000 mcg by mouth daily.    . Calcium Carb-Cholecalciferol (CALCIUM 1000 + D PO) Take 5 tablets by mouth daily. D3 = 400 IU per each.    . cetirizine (ZYRTEC) 10 MG tablet Take 10 mg by mouth daily.     Marland Kitchen L-LYSINE PO Take 3,000 mg by mouth daily.     Marland Kitchen lidocaine-prilocaine (EMLA) cream Apply 1 application topically as needed. 30 g 0  . Misc Natural Products (TART CHERRY ADVANCED PO) Take 1,200 mg by mouth daily.    . Multiple Vitamins-Minerals (CENTRUM SILVER PO) Take 1 tablet by mouth daily.    . Omega-3 Fatty Acids (RA FISH OIL) 1400 MG CPDR Take 2 capsules by mouth daily.     Marland Kitchen selenium 50 MCG TABS tablet Take 200 mcg by mouth daily.    . Turmeric Curcumin 500 MG CAPS Take 1 each by mouth daily.     . vitamin B-12 (CYANOCOBALAMIN) 1000 MCG tablet Take 1,000 mcg by mouth daily.     No current facility-administered medications for this visit.     SURGICAL HISTORY:  Past Surgical History:  Procedure Laterality Date  . COLONOSCOPY    . COLONOSCOPY W/ POLYPECTOMY    . COLONOSCOPY WITH  PROPOFOL N/A 01/20/2016   Procedure: COLONOSCOPY WITH PROPOFOL;  Surgeon: Arta Silence, MD;  Location: [facility];  Service: Endoscopy;  Laterality: N/A;  . CYSTECTOMY     right breast x 2  . MEDIASTINOSCOPY N/A 05/07/2015   Procedure: MEDIASTINOSCOPY;  Surgeon: Ivin Poot, MD;  Location: [facility];  Service: Thoracic;  Laterality: N/A;  . TONSILLECTOMY    . VIDEO BRONCHOSCOPY WITH ENDOBRONCHIAL ULTRASOUND N/A 05/07/2015   Procedure: VIDEO BRONCHOSCOPY WITH ENDOBRONCHIAL ULTRASOUND;  Surgeon: Ivin Poot, MD;  [facility]: [facility] OR;  Service: Thoracic;  Laterality: N/A;    REVIEW OF SYSTEMS:  A comprehensive review of systems was negative.   PHYSICAL EXAMINATION: General  appearance: alert, cooperative and no distress Head: Normocephalic, without obvious abnormality, atraumatic Neck: no adenopathy, no JVD, supple, symmetrical, trachea midline and thyroid not enlarged, symmetric, no tenderness/mass/nodules Lymph nodes: Cervical, supraclavicular, and axillary nodes normal. Resp: clear to auscultation bilaterally Back: symmetric, no curvature. ROM normal. No CVA tenderness. Cardio: regular rate and rhythm, S1, S2 normal, no murmur, click, rub or gallop GI: soft, non-tender; bowel sounds normal; no masses,  no organomegaly Extremities: extremities normal, atraumatic, no cyanosis or edema  ECOG PERFORMANCE STATUS: 0 - Asymptomatic  Blood pressure (!) 127/97, pulse (!) 103, temperature 97.7 F (36.5 C), temperature source Oral, resp. rate 18, weight 140 lb 8 oz (63.7 kg), SpO2 99 %.  LABORATORY DATA: Lab Results  Component Value Date   WBC 3.0 (L) 09/30/2016   HGB 12.9 09/30/2016   HCT 38.5 09/30/2016   MCV 92.8 09/30/2016   PLT 250 09/30/2016      Chemistry      Component Value Date/Time   NA 140 09/30/2016 0941   K 4.1 09/30/2016 0941   CL 107 07/03/2015 1200   CO2 26 09/30/2016 0941   BUN 16.2 09/30/2016 0941   CREATININE 0.8 09/30/2016 0941      Component Value Date/Time   CALCIUM 9.3 09/30/2016 0941   ALKPHOS 37 (L) 09/30/2016 0941   AST 22 09/30/2016 0941   ALT 19 09/30/2016 0941   BILITOT 0.43 09/30/2016 0941       RADIOGRAPHIC STUDIES: CT scan of the chest on 10-08-16 showed:  1. Stable CT appearance of the chest. Extensive radiation changes  but no findings worrisome for recurrent tumor, mediastinal/hilar  adenopathy or pulmonary metastatic disease.  2. Stable small pericardial effusion.  3. No upper abdominal metastatic disease.     Electronically Signed  ByMarijo Sanes M.D.  On: 10-08-2016 16:51   ASSESSMENT AND PLAN:  This is a very pleasant 61 years old white female with a stage IIIB non-small cell  lung cancer, adenocarcinoma with negative EGFR, ALK mutations diagnosed in October 2016. She is status post course of concurrent chemoradiation followed by consolidation chemotherapy with carboplatin and Alimta. She is currently on observation. Her recent CT scan of the chest showed no evidence for disease progression. I discussed the scan results with the patient and her husband. I recommended for her to continue on observation with repeat CT scan of the chest in 6 months for restaging of her disease. She was advised to call immediately if she has any concerning symptoms in the interval. The patient voices understanding of current disease status and treatment options and is in agreement with the current care plan. All questions were answered. The patient knows to call the clinic with any problems, questions or concerns. We can certainly see the patient much sooner if necessary. I spent 10 minutes counseling  the patient face to face. The total time spent in the appointment was 15 minutes.  Disclaimer: This note was dictated with voice recognition software. Similar sounding words can inadvertently be transcribed and may not be corrected upon review.

## 2016-10-12 IMAGING — MR MR HEAD WO/W CM
11 of 14 series · 32 of 48 positions shown · IV contrast (multihance)
Comparison: PET-CT 04/22/2015

CLINICAL DATA: 59-year-old female with recently diagnosed lung
cancer. Headache. Staging. Subsequent encounter.

EXAM:
MRI HEAD WITHOUT AND WITH CONTRAST
TECHNIQUE: Multiplanar, multiecho pulse sequences of the brain and surrounding
structures were obtained without and with intravenous contrast.
CONTRAST:  15mL MULTIHANCE GADOBENATE DIMEGLUMINE 529 MG/ML IV SOLN

[Series 3: DWI · axial · 3.6mm · 0.94mm/px · z∈[-63,+88]mm · 6 of 86 slices shown (1 of 4)]
[im 1/86]
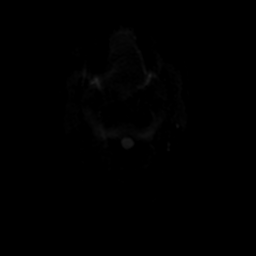
[im 18/86]
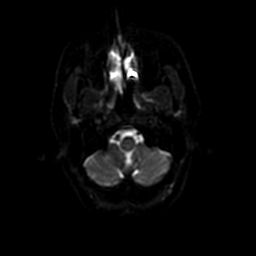
[im 35/86]
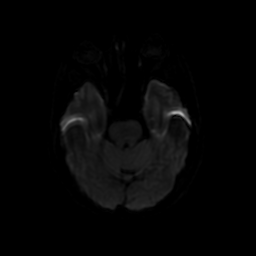
[im 52/86]
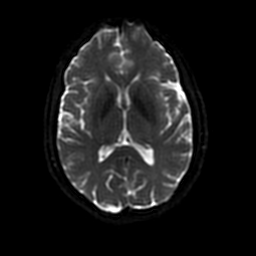
[im 69/86]
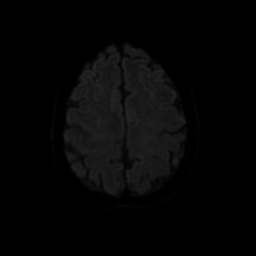
[im 86/86]
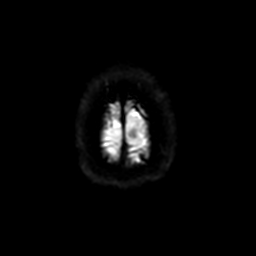

[Series 4: FLAIR · sagittal · 5.0mm · 0.47mm/px · 2 of 23 slices shown (1 of 3)]
[im 1/23]
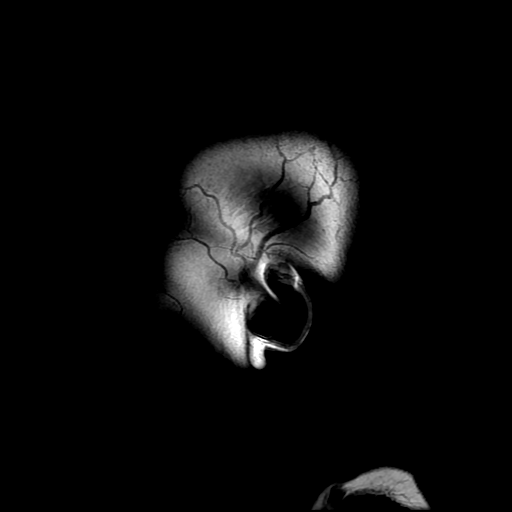
[im 23/23]
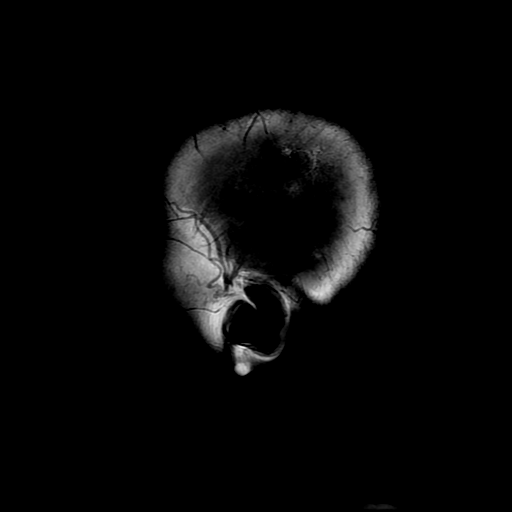

[Series 5: T2 · axial · 5.0mm · 0.47mm/px · z∈[-51,+87]mm · 2 of 24 slices shown]
[im 1/24]
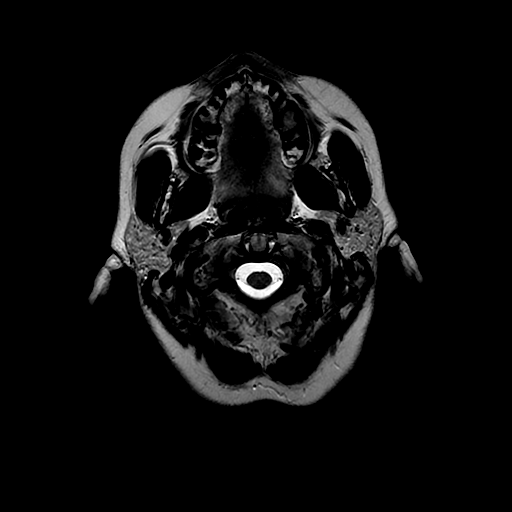
[im 24/24]
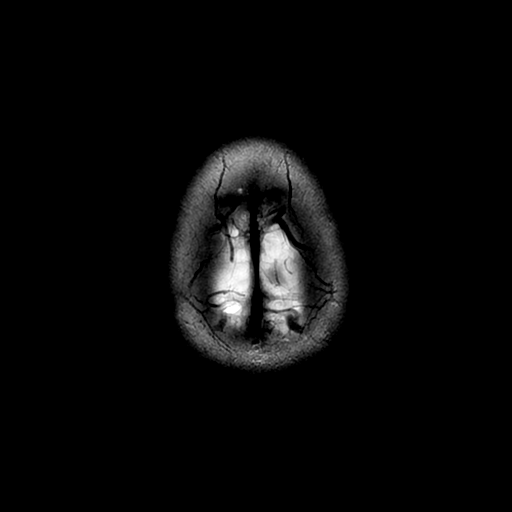

[Series 6: (person_name) · axial · 3.0mm · 0.47mm/px · z∈[-53,-33]mm · 2 of 96 slices shown]
[im 1/96]
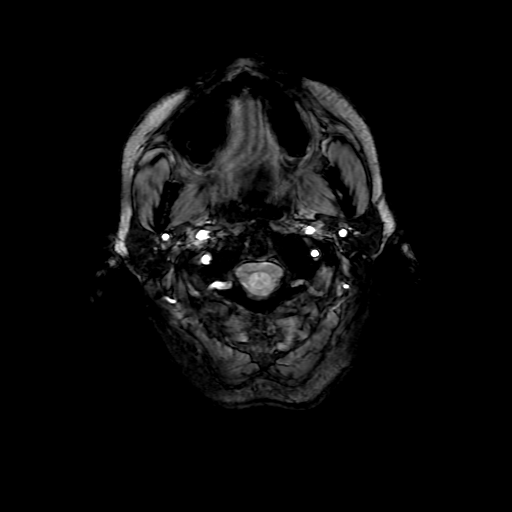
[im 14/96]
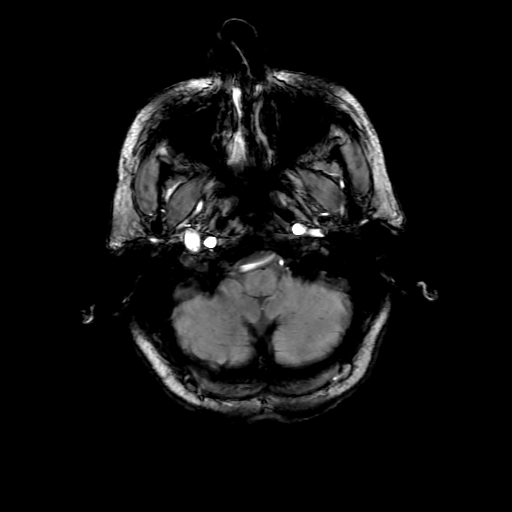

[Series 8: FLAIR · axial · 5.0mm · 0.47mm/px · z∈[-51,+87]mm · 2 of 24 slices shown (2 of 3)]
[im 1/24]
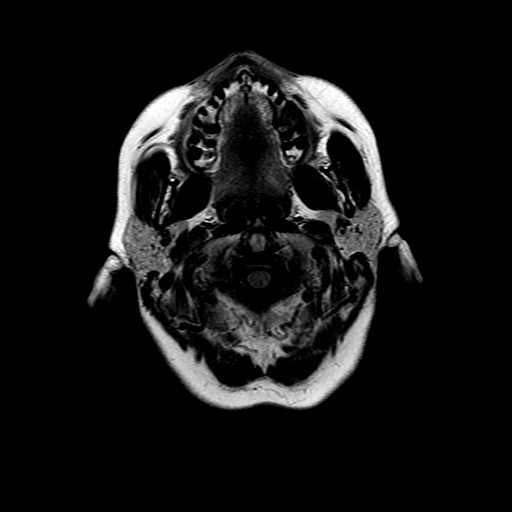
[im 24/24]
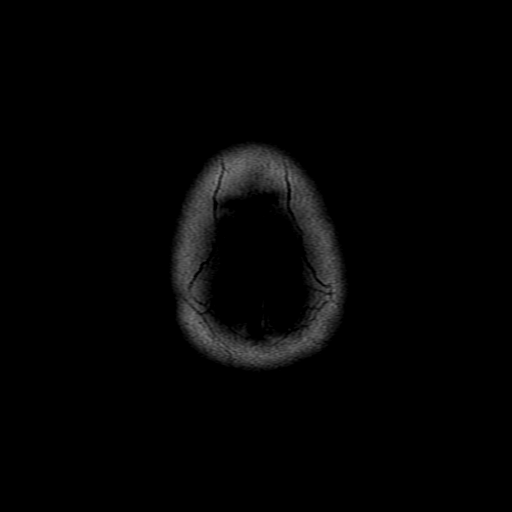

[Series 9: DWI · coronal · 5.0mm · 0.94mm/px · 6 of 72 slices shown (2 of 4)]
[im 1/72]
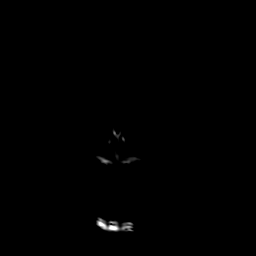
[im 15/72]
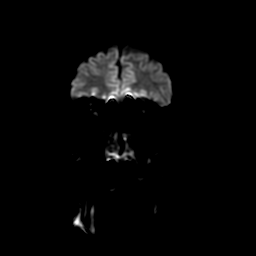
[im 29/72]
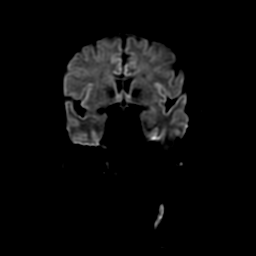
[im 43/72]
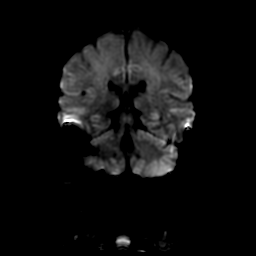
[im 57/72]
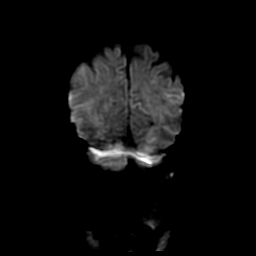
[im 72/72]
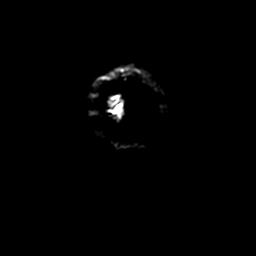

[Series 10: T2 post-contrast · coronal · 5.0mm · 0.39mm/px · 2 of 30 slices shown]
[im 1/30]
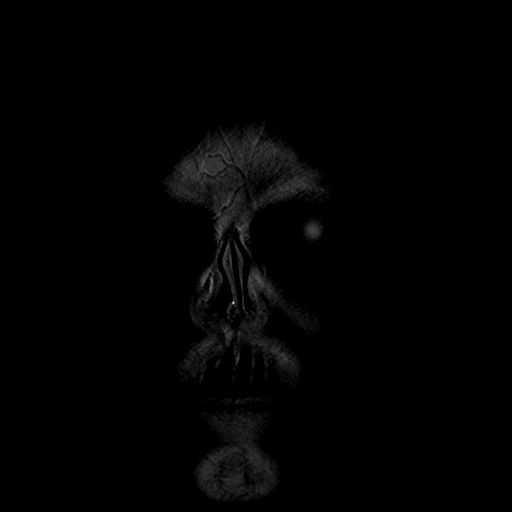
[im 30/30]
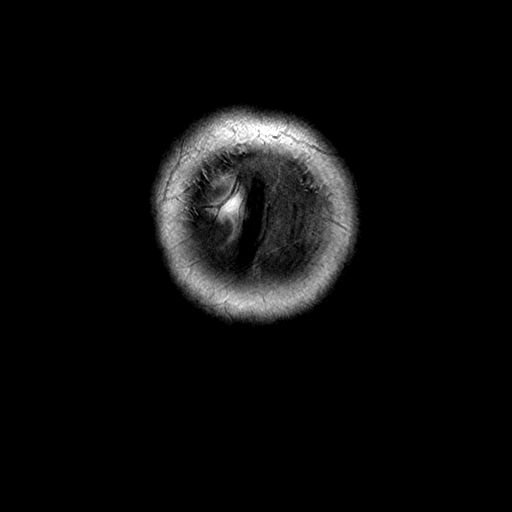

[Series 12: T1 · coronal · 5.0mm · 0.39mm/px · 2 of 30 slices shown]
[im 1/30]
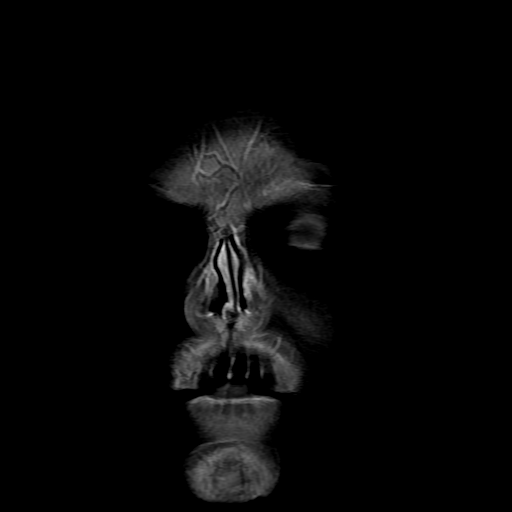
[im 30/30]
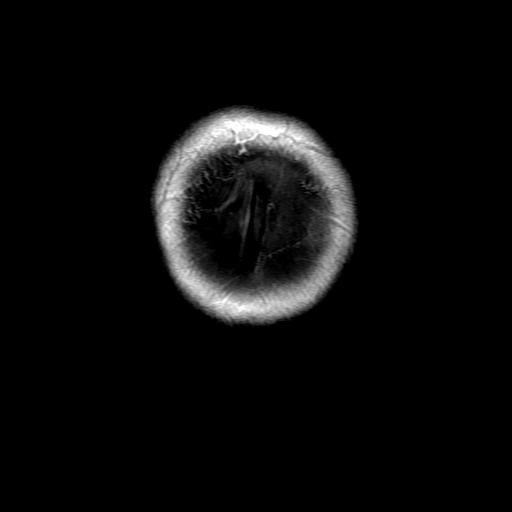

[Series 13: FLAIR · sagittal · 5.0mm · 0.47mm/px · 2 of 23 slices shown (3 of 3)]
[im 1/23]
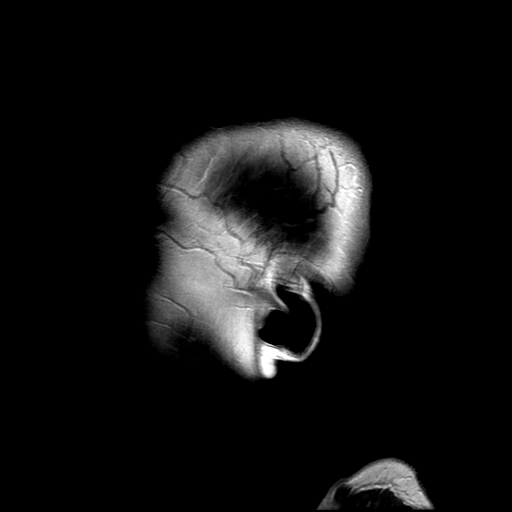
[im 23/23]
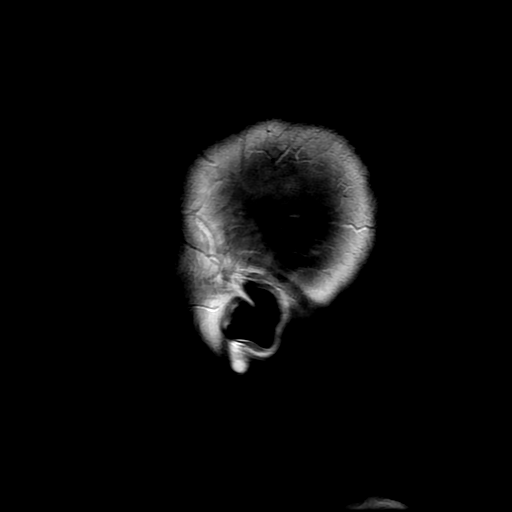

[Series 300: DWI · axial · 3.6mm · 0.94mm/px · z∈[-63,+88]mm · 3 of 43 slices shown (3 of 4)]
[im 1/43]
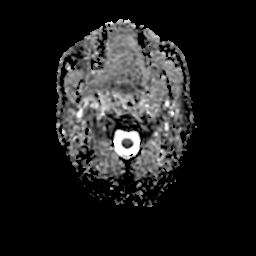
[im 22/43]
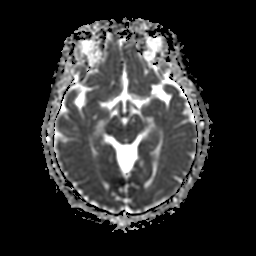
[im 43/43]
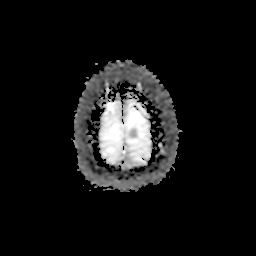

[Series 900: DWI · coronal · 5.0mm · 0.94mm/px · 3 of 35 slices shown (4 of 4)]
[im 1/35]
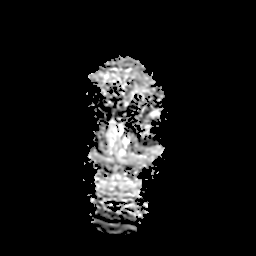
[im 18/35]
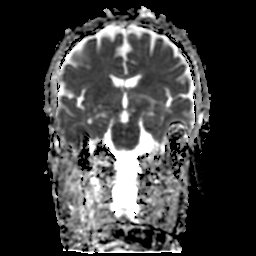
[im 35/35]
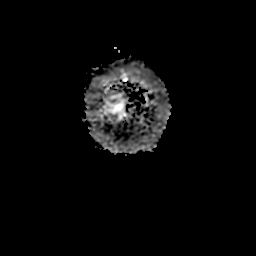

[32 of 48 positions shown; findings below may reference images not displayed]

FINDINGS: No abnormal enhancement identified. No midline shift, mass effect,
or evidence of intracranial mass lesion. No dural thickening
identified. Negative visualized cervical spinal cord.

Visualized bone marrow signal is within normal limits.

Major intracranial vascular flow voids are within normal limits. No
restricted diffusion to suggest acute infarction.
Noventriculomegaly, extra-axial collection or acute intracranial
hemorrhage. Cervicomedullary junction and pituitary are within
normal limits. Gray and white matter signal is within normal limits
for age throughout the brain.

Visible internal auditory structures appear normal. Mastoids are
clear. Trace paranasal sinus mucosal thickening. Negative orbit and
scalp soft tissues.
IMPRESSION: Negative brain MRI. No acute or metastatic intracranial abnormality.

## 2016-10-17 ENCOUNTER — Other Ambulatory Visit: Payer: Self-pay | Admitting: Family Medicine

## 2016-10-17 DIAGNOSIS — Z1231 Encounter for screening mammogram for malignant neoplasm of breast: Secondary | ICD-10-CM

## 2016-11-28 ENCOUNTER — Ambulatory Visit (HOSPITAL_BASED_OUTPATIENT_CLINIC_OR_DEPARTMENT_OTHER): Payer: Managed Care, Other (non HMO)

## 2016-11-28 DIAGNOSIS — Z452 Encounter for adjustment and management of vascular access device: Secondary | ICD-10-CM | POA: Diagnosis not present

## 2016-11-28 DIAGNOSIS — C3411 Malignant neoplasm of upper lobe, right bronchus or lung: Secondary | ICD-10-CM | POA: Diagnosis not present

## 2016-11-28 DIAGNOSIS — Z95828 Presence of other vascular implants and grafts: Secondary | ICD-10-CM

## 2016-11-28 MED ORDER — HEPARIN SOD (PORK) LOCK FLUSH 100 UNIT/ML IV SOLN
500.0000 [IU] | Freq: Once | INTRAVENOUS | Status: AC | PRN
Start: 2016-11-28 — End: 2016-11-28
  Administered 2016-11-28: 500 [IU] via INTRAVENOUS
  Filled 2016-11-28: qty 5

## 2016-11-28 MED ORDER — SODIUM CHLORIDE 0.9 % IJ SOLN
10.0000 mL | INTRAMUSCULAR | Status: DC | PRN
  Administered 2016-11-28: 10 mL via INTRAVENOUS
  Filled 2016-11-28: qty 10

## 2016-11-28 NOTE — Patient Instructions (Signed)

## 2016-11-30 ENCOUNTER — Ambulatory Visit
Admission: RE | Admit: 2016-11-30 | Discharge: 2016-11-30 | Disposition: A | Discharge status: Home / Self Care | Payer: Managed Care, Other (non HMO) | Source: Ambulatory Visit | Attending: Family Medicine | Admitting: Family Medicine

## 2016-11-30 DIAGNOSIS — Z1231 Encounter for screening mammogram for malignant neoplasm of breast: Secondary | ICD-10-CM

## 2017-01-23 ENCOUNTER — Ambulatory Visit (HOSPITAL_BASED_OUTPATIENT_CLINIC_OR_DEPARTMENT_OTHER): Payer: Managed Care, Other (non HMO)

## 2017-01-23 DIAGNOSIS — C3411 Malignant neoplasm of upper lobe, right bronchus or lung: Secondary | ICD-10-CM | POA: Diagnosis not present

## 2017-01-23 DIAGNOSIS — Z452 Encounter for adjustment and management of vascular access device: Secondary | ICD-10-CM | POA: Diagnosis not present

## 2017-01-23 DIAGNOSIS — Z95828 Presence of other vascular implants and grafts: Secondary | ICD-10-CM

## 2017-01-23 MED ORDER — HEPARIN SOD (PORK) LOCK FLUSH 100 UNIT/ML IV SOLN
500.0000 [IU] | Freq: Once | INTRAVENOUS | Status: AC | PRN
  Administered 2017-01-23: 500 [IU] via INTRAVENOUS
  Filled 2017-01-23: qty 5

## 2017-01-23 MED ORDER — SODIUM CHLORIDE 0.9 % IJ SOLN
10.0000 mL | INTRAMUSCULAR | Status: DC | PRN
  Administered 2017-01-23: 10 mL via INTRAVENOUS
  Filled 2017-01-23: qty 10

## 2017-01-23 NOTE — Patient Instructions (Signed)

## 2017-01-27 ENCOUNTER — Telehealth: Payer: Self-pay | Admitting: Internal Medicine

## 2017-01-27 ENCOUNTER — Telehealth: Payer: Self-pay | Admitting: Emergency Medicine

## 2017-01-27 NOTE — Telephone Encounter (Signed)
Patient requesting MD visit be moved up one week; patient also needs scans scheduled prior to MD visit. Will have scheduling change office visit.

## 2017-01-27 NOTE — Telephone Encounter (Signed)
R/s per patient request in sch message from [place] - patient is aware of appt date and time.

## 2017-03-20 ENCOUNTER — Ambulatory Visit (HOSPITAL_COMMUNITY)
Admission: RE | Admit: 2017-03-20 | Discharge: 2017-03-20 | Disposition: A | Discharge status: Home / Self Care | Payer: Managed Care, Other (non HMO) | Source: Ambulatory Visit | Attending: Internal Medicine | Admitting: Internal Medicine

## 2017-03-20 ENCOUNTER — Other Ambulatory Visit: Payer: Managed Care, Other (non HMO)

## 2017-03-20 ENCOUNTER — Other Ambulatory Visit: Payer: Self-pay | Admitting: Medical Oncology

## 2017-03-20 ENCOUNTER — Other Ambulatory Visit (HOSPITAL_BASED_OUTPATIENT_CLINIC_OR_DEPARTMENT_OTHER): Payer: Managed Care, Other (non HMO)

## 2017-03-20 ENCOUNTER — Ambulatory Visit (HOSPITAL_BASED_OUTPATIENT_CLINIC_OR_DEPARTMENT_OTHER): Payer: Managed Care, Other (non HMO)

## 2017-03-20 DIAGNOSIS — C3411 Malignant neoplasm of upper lobe, right bronchus or lung: Secondary | ICD-10-CM

## 2017-03-20 DIAGNOSIS — I2510 Atherosclerotic heart disease of native coronary artery without angina pectoris: Secondary | ICD-10-CM | POA: Insufficient documentation

## 2017-03-20 DIAGNOSIS — R918 Other nonspecific abnormal finding of lung field: Secondary | ICD-10-CM | POA: Insufficient documentation

## 2017-03-20 DIAGNOSIS — Z95828 Presence of other vascular implants and grafts: Secondary | ICD-10-CM

## 2017-03-20 DIAGNOSIS — Z9889 Other specified postprocedural states: Secondary | ICD-10-CM | POA: Diagnosis not present

## 2017-03-20 DIAGNOSIS — I700 Atherosclerosis of aorta: Secondary | ICD-10-CM | POA: Insufficient documentation

## 2017-03-20 LAB — COMPREHENSIVE METABOLIC PANEL
ALBUMIN: 3.8 g/dL (ref 3.5–5.0)
ALK PHOS: 37 U/L — AB (ref 40–150)
ALT: 22 U/L (ref 0–55)
ANION GAP: 5 meq/L (ref 3–11)
AST: 25 U/L (ref 5–34)
BUN: 15.4 mg/dL (ref 7.0–26.0)
CALCIUM: 9.8 mg/dL (ref 8.4–10.4)
CHLORIDE: 107 meq/L (ref 98–109)
CO2: 27 mEq/L (ref 22–29)
Creatinine: 0.7 mg/dL (ref 0.6–1.1)
EGFR: 89 mL/min/{1.73_m2} — AB (ref >90)
Glucose: 88 mg/dl (ref 70–140)
POTASSIUM: 4.3 meq/L (ref 3.5–5.1)
Sodium: 139 mEq/L (ref 136–145)
Total Bilirubin: 0.42 mg/dL (ref 0.20–1.20)
Total Protein: 6.6 g/dL (ref 6.4–8.3)

## 2017-03-20 LAB — CBC WITH DIFFERENTIAL/PLATELET
BASO%: 0.5 % (ref 0.0–2.0)
BASOS ABS: 0 10*3/uL (ref 0.0–0.1)
EOS ABS: 0.1 10*3/uL (ref 0.0–0.5)
EOS%: 1.9 % (ref 0.0–7.0)
HEMATOCRIT: 40.1 % (ref 34.8–46.6)
HEMOGLOBIN: 13.3 g/dL (ref 11.6–15.9)
LYMPH#: 1.6 10*3/uL (ref 0.9–3.3)
LYMPH%: 41.9 % (ref 14.0–49.7)
MCH: 31.1 pg (ref 25.1–34.0)
MCHC: 33.2 g/dL (ref 31.5–36.0)
MCV: 93.9 fL (ref 79.5–101.0)
MONO#: 0.3 10*3/uL (ref 0.1–0.9)
MONO%: 9.2 % (ref 0.0–14.0)
NEUT#: 1.7 10*3/uL (ref 1.5–6.5)
NEUT%: 46.5 % (ref 38.4–76.8)
PLATELETS: 238 10*3/uL (ref 145–400)
RBC: 4.27 10*6/uL (ref 3.70–5.45)
RDW: 13.8 % (ref 11.2–14.5)
WBC: 3.7 10*3/uL — ABNORMAL LOW (ref 3.9–10.3)

## 2017-03-20 MED ORDER — HEPARIN SOD (PORK) LOCK FLUSH 100 UNIT/ML IV SOLN: 500.0000 [IU] | Freq: Once | INTRAVENOUS | Status: DC

## 2017-03-20 MED ORDER — SODIUM CHLORIDE 0.9 % IJ SOLN
10.0000 mL | INTRAMUSCULAR | Status: DC | PRN
Start: 2017-03-20 — End: 2017-03-20
  Administered 2017-03-20: 10 mL via INTRAVENOUS
  Filled 2017-03-20: qty 10

## 2017-03-20 MED ORDER — IOPAMIDOL (ISOVUE-300) INJECTION 61%
INTRAVENOUS | Status: AC
  Filled 2017-03-20: qty 75

## 2017-03-20 MED ORDER — HEPARIN SOD (PORK) LOCK FLUSH 100 UNIT/ML IV SOLN
INTRAVENOUS | Status: AC
  Administered 2017-03-20: 500 [IU]
  Filled 2017-03-20: qty 5

## 2017-03-20 MED ORDER — IOPAMIDOL (ISOVUE-300) INJECTION 61%
75.0000 mL | Freq: Once | INTRAVENOUS | Status: AC | PRN
  Administered 2017-03-20: 75 mL via INTRAVENOUS

## 2017-03-29 ENCOUNTER — Encounter: Payer: Self-pay | Admitting: Internal Medicine

## 2017-03-29 ENCOUNTER — Ambulatory Visit (HOSPITAL_BASED_OUTPATIENT_CLINIC_OR_DEPARTMENT_OTHER): Payer: Managed Care, Other (non HMO) | Admitting: Internal Medicine

## 2017-03-29 VITALS — BP 116/82 | HR 100 | Resp 18 | Wt 139.8 lb

## 2017-03-29 DIAGNOSIS — C3411 Malignant neoplasm of upper lobe, right bronchus or lung: Secondary | ICD-10-CM

## 2017-03-29 DIAGNOSIS — C3490 Malignant neoplasm of unspecified part of unspecified bronchus or lung: Secondary | ICD-10-CM

## 2017-03-29 NOTE — Progress Notes (Signed)
.      [place] [phone]   Fax:([phone]  OFFICE PROGRESS NOTE  [name], DO [address] [place] [address]  DIAGNOSIS: Stage IIIB (T2a, N3, M0) non-small cell lung cancer, adenocarcinoma with negative EGFR mutation and negative ALK gene translocation diagnosed in October 2016.  PRIOR THERAPY:  1) Concurrent chemoradiation with weekly carboplatin for AUC of 2 and paclitaxel 45 MG/M2 status post 6 cycles. Last dose was given 07/13/2015 with partial response. 2) Systemic chemotherapy with carboplatin for AUC of 5 and Alimta 500 MG/M2 every 3 weeks. First dose 11/02/2015. Status post 6 cycles.  CURRENT THERAPY: Observation.  INTERVAL HISTORY: Rose Figueroa 61 y.o. female returns to the clinic today for six-month follow-up visit accompanied by her husband. The patient is feeling very well today with no specific complaints except for postnasal drainage and mild congestion. She denied having any chest pain, shortness of breath, cough or hemoptysis. She denied having any fever or chills. She has no nausea, vomiting, diarrhea or constipation. The patient had repeat CT scan of the chest performed recently and she is here for evaluation and discussion of her scan results.   MEDICAL HISTORY: Past Medical History:  Diagnosis Date  . Anxiety   . Back pain    right back lateral  . BCC (basal cell carcinoma of skin)   . Colon polyps   . Complication of anesthesia    pt. states that she is difficult to arouse  . Complication of anesthesia    "indigestion and burping " after eating.  . Diverticulosis   . Dysrhythmia    "skip beat in early 20's"  . H/O cold sores   . History of anemia   . Lung cancer ([place])    RUL mass -being tx- radiation and chemo- remains last chemo 01-04-16  . Osteoporosis   . PONV (postoperative nausea and vomiting)   . Rectal bleeding   . right upper lobe lung mass 04/15/2015   malignant tumor found - 10'16-radiation and  chemotherapy and remains with chemo at present- Dr. Earlie Server follows.    ALLERGIES:  has No Known Allergies.  MEDICATIONS:  Current Outpatient Prescriptions  Medication Sig Dispense Refill  . Biotin 5000 MCG CAPS Take 5,000 mcg by mouth daily.    . Calcium Carb-Cholecalciferol (CALCIUM 1000 + D PO) Take 4 tablets by mouth daily. D3 = " 25 mcg"    . cetirizine (ZYRTEC) 10 MG tablet Take 10 mg by mouth daily.     Marland Kitchen L-LYSINE PO Take 3,000 mg by mouth daily.     Marland Kitchen lidocaine-prilocaine (EMLA) cream Apply 1 application topically as needed. 30 g 0  . Misc Natural Products (TART CHERRY ADVANCED PO) Take 1,200 mg by mouth daily.    . Multiple Vitamins-Minerals (CENTRUM SILVER PO) Take 1 tablet by mouth daily.    . Omega-3 Fatty Acids (RA FISH OIL) 1400 MG CPDR Take 2 capsules by mouth daily.     Marland Kitchen OVER THE COUNTER MEDICATION Take 1,000 mg by mouth daily. "spirolina'    . Turmeric Curcumin 500 MG CAPS Take 1 each by mouth daily.     . vitamin B-12 (CYANOCOBALAMIN) 1000 MCG tablet Take 1,000 mcg by mouth daily.     No current facility-administered medications for this visit.     SURGICAL HISTORY:  Past Surgical History:  Procedure Laterality Date  . COLONOSCOPY    . COLONOSCOPY W/ POLYPECTOMY    . COLONOSCOPY WITH  PROPOFOL N/A 01/20/2016   Procedure: COLONOSCOPY WITH PROPOFOL;  Surgeon: Arta Silence, MD;  Location: [facility];  Service: Endoscopy;  Laterality: N/A;  . CYSTECTOMY     right breast x 2  . MEDIASTINOSCOPY N/A 05/07/2015   Procedure: MEDIASTINOSCOPY;  Surgeon: Ivin Poot, MD;  Location: [facility];  Service: Thoracic;  Laterality: N/A;  . TONSILLECTOMY    . VIDEO BRONCHOSCOPY WITH ENDOBRONCHIAL ULTRASOUND N/A 05/07/2015   Procedure: VIDEO BRONCHOSCOPY WITH ENDOBRONCHIAL ULTRASOUND;  Surgeon: Ivin Poot, MD;  Location: [facility];  Service: Thoracic;  Laterality: N/A;    REVIEW OF SYSTEMS:  A comprehensive review of systems was negative except for: Ears, nose, mouth, throat,  and face: positive for nasal congestion   PHYSICAL EXAMINATION: General appearance: alert, cooperative and no distress Head: Normocephalic, without obvious abnormality, atraumatic Neck: no adenopathy, no JVD, supple, symmetrical, trachea midline and thyroid not enlarged, symmetric, no tenderness/mass/nodules Lymph nodes: Cervical, supraclavicular, and axillary nodes normal. Resp: clear to auscultation bilaterally Back: symmetric, no curvature. ROM normal. No CVA tenderness. Cardio: regular rate and rhythm, S1, S2 normal, no murmur, click, rub or gallop GI: soft, non-tender; bowel sounds normal; no masses,  no organomegaly Extremities: extremities normal, atraumatic, no cyanosis or edema  ECOG PERFORMANCE STATUS: 0 - Asymptomatic  Blood pressure 116/82, pulse 100, resp. rate 18, weight 139 lb 12.8 oz (63.4 kg), SpO2 98 %.  LABORATORY DATA: Lab Results  Component Value Date   WBC 3.7 (L) 03/20/2017   HGB 13.3 03/20/2017   HCT 40.1 03/20/2017   MCV 93.9 03/20/2017   PLT 238 03/20/2017      Chemistry      Component Value Date/Time   NA 139 03/20/2017 1038   K 4.3 03/20/2017 1038   CL 107 07/03/2015 1200   CO2 27 03/20/2017 1038   BUN 15.4 03/20/2017 1038   CREATININE 0.7 03/20/2017 1038      Component Value Date/Time   CALCIUM 9.8 03/20/2017 1038   ALKPHOS 37 (L) 03/20/2017 1038   AST 25 03/20/2017 1038   ALT 22 03/20/2017 1038   BILITOT 0.42 03/20/2017 1038       RADIOGRAPHIC STUDIES: Ct Chest W Contrast  Result Date: 03/20/2017 CLINICAL DATA:  Right lung cancer, chemotherapy and radiation therapy complete. EXAM: CT CHEST WITH CONTRAST TECHNIQUE: Multidetector CT imaging of the chest was performed during intravenous contrast administration. CONTRAST:  39m ISOVUE-300 IOPAMIDOL (ISOVUE-300) INJECTION 61% COMPARISON:  09/29/2016. FINDINGS: Cardiovascular: Left IJ Port-A-Cath terminates at the SVC RA junction. Atherosclerotic calcification of the arterial vasculature,  including coronary arteries. Heart size normal. Small amount of pericardial fluid is unchanged. Mediastinum/Nodes: No pathologically enlarged mediastinal, hilar or axillary lymph nodes. Esophagus is grossly unremarkable. Lungs/Pleura: Post treatment collapse of the right upper lobe with additional subtotal volume loss in the right middle lobe and. Left lung is clear. No pleural fluid. Airway is otherwise unremarkable. Upper Abdomen: Subcentimeter low-attenuation lesions in the liver are too small to characterize. Visualized portions of the liver, gallbladder, adrenal glands, kidneys, spleen, pancreas, stomach and bowel are otherwise grossly unremarkable. Musculoskeletal: No worrisome lytic or sclerotic lesions. Degenerative changes are seen in the spine. IMPRESSION: 1. Post treatment volume loss in the right hemithorax. No evidence of metastatic disease. 2. Aortic atherosclerosis (ICD10-170.0). Coronary artery calcification. 3. Small amount of pericardial fluid is unchanged. Electronically Signed   By: MLorin PicketM.D.   On: 03/20/2017 15:16   ASSESSMENT AND PLAN:  This is a very pleasant 61years old white female  with a stage IIIB non-small cell lung cancer, adenocarcinoma with negative EGFR, ALK mutations diagnosed in October 2016. She is status post course of concurrent chemoradiation followed by consolidation chemotherapy with carboplatin and Alimta. The patient has been on observation for more than one year. She is feeling fine with no specific complaints. Her recent CT scan of the chest showed no evidence for disease progression. I discussed the scan results with the patient and her husband and recommended for her to continue on observation with repeat CT scan of the chest in 6 months. She was advised to call immediately if she has any concerning symptoms in the interval. The patient voices understanding of current disease status and treatment options and is in agreement with the current care  plan. All questions were answered. The patient knows to call the clinic with any problems, questions or concerns. We can certainly see the patient much sooner if necessary. I spent 10 minutes counseling the patient face to face. The total time spent in the appointment was 15 minutes.  Disclaimer: This note was dictated with voice recognition software. Similar sounding words can inadvertently be transcribed and may not be corrected upon review.

## 2017-04-03 ENCOUNTER — Other Ambulatory Visit: Payer: Managed Care, Other (non HMO)

## 2017-04-05 ENCOUNTER — Ambulatory Visit: Payer: Managed Care, Other (non HMO) | Admitting: Internal Medicine

## 2017-04-05 ENCOUNTER — Telehealth: Payer: Self-pay | Admitting: Internal Medicine

## 2017-04-05 NOTE — Telephone Encounter (Signed)
Scheduled appt per 9/5 los - sent reminder letter in the mail. - [facility] to contact patient with ct schedule.

## 2017-05-08 ENCOUNTER — Ambulatory Visit (HOSPITAL_BASED_OUTPATIENT_CLINIC_OR_DEPARTMENT_OTHER): Payer: Managed Care, Other (non HMO)

## 2017-05-08 DIAGNOSIS — C3411 Malignant neoplasm of upper lobe, right bronchus or lung: Secondary | ICD-10-CM | POA: Diagnosis not present

## 2017-05-08 DIAGNOSIS — Z452 Encounter for adjustment and management of vascular access device: Secondary | ICD-10-CM

## 2017-05-08 DIAGNOSIS — Z95828 Presence of other vascular implants and grafts: Secondary | ICD-10-CM

## 2017-05-08 MED ORDER — SODIUM CHLORIDE 0.9 % IJ SOLN
10.0000 mL | INTRAMUSCULAR | Status: DC | PRN
  Administered 2017-05-08: 10 mL via INTRAVENOUS
  Filled 2017-05-08: qty 10

## 2017-05-08 MED ORDER — HEPARIN SOD (PORK) LOCK FLUSH 100 UNIT/ML IV SOLN
500.0000 [IU] | Freq: Once | INTRAVENOUS | Status: AC | PRN
  Administered 2017-05-08: 500 [IU] via INTRAVENOUS
  Filled 2017-05-08: qty 5

## 2017-06-26 ENCOUNTER — Ambulatory Visit (HOSPITAL_BASED_OUTPATIENT_CLINIC_OR_DEPARTMENT_OTHER): Payer: Managed Care, Other (non HMO)

## 2017-06-26 DIAGNOSIS — Z452 Encounter for adjustment and management of vascular access device: Secondary | ICD-10-CM

## 2017-06-26 DIAGNOSIS — Z95828 Presence of other vascular implants and grafts: Secondary | ICD-10-CM

## 2017-06-26 DIAGNOSIS — C3411 Malignant neoplasm of upper lobe, right bronchus or lung: Secondary | ICD-10-CM | POA: Diagnosis not present

## 2017-06-26 MED ORDER — SODIUM CHLORIDE 0.9 % IJ SOLN
10.0000 mL | INTRAMUSCULAR | Status: DC | PRN
  Administered 2017-06-26: 10 mL via INTRAVENOUS
  Filled 2017-06-26: qty 10

## 2017-06-26 MED ORDER — HEPARIN SOD (PORK) LOCK FLUSH 100 UNIT/ML IV SOLN
500.0000 [IU] | Freq: Once | INTRAVENOUS | Status: AC | PRN
  Administered 2017-06-26: 500 [IU] via INTRAVENOUS
  Filled 2017-06-26: qty 5

## 2017-07-12 ENCOUNTER — Other Ambulatory Visit: Payer: Self-pay | Admitting: Medical Oncology

## 2017-07-12 DIAGNOSIS — Z95828 Presence of other vascular implants and grafts: Secondary | ICD-10-CM

## 2017-07-12 MED ORDER — LIDOCAINE-PRILOCAINE 2.5-2.5 % EX CREA: 1.0000 "application " | TOPICAL_CREAM | CUTANEOUS | 0 refills | Status: DC | PRN

## 2017-08-02 ENCOUNTER — Ambulatory Visit: Payer: Managed Care, Other (non HMO) | Admitting: Podiatry

## 2017-08-02 ENCOUNTER — Encounter: Payer: Self-pay | Admitting: Podiatry

## 2017-08-02 DIAGNOSIS — L600 Ingrowing nail: Secondary | ICD-10-CM | POA: Diagnosis not present

## 2017-08-02 DIAGNOSIS — L309 Dermatitis, unspecified: Secondary | ICD-10-CM | POA: Diagnosis not present

## 2017-08-02 NOTE — Patient Instructions (Signed)

## 2017-08-04 NOTE — Progress Notes (Signed)
Subjective:   Patient ID: Rose Figueroa, female   DOB: 62 y.o.   MRN: [id number]   HPI Patient presents stating she's had chronic ingrown toenails of both big toes and it's making it difficult for her to wear shoe gear or to be comfortable. She tries to soak and trimming them but that no longer works and also is had some irritation of her heels. Patient does not smoke and likes to be active   Review of Systems  All other systems reviewed and are negative.       Objective:  Physical Exam  Constitutional: She appears well-developed and well-nourished.  Cardiovascular: Intact distal pulses.  Pulmonary/Chest: Effort normal.  Musculoskeletal: Normal range of motion.  Neurological: She is alert.  Skin: Skin is warm.  Nursing note and vitals reviewed.   Neurovascular status intact muscle strength adequate range of motion within normal limits with ingrown toenail deformity medial border of the hallux bilateral with distal redness and irritation when palpated. Patient is noted to have good digital perfusion is well oriented 3 with slight irritation of her skin also noted     Assessment:  Chronic ingrown toenail deformity hallux bilateral along with mild dermatitis condition     Plan:  H&P condition reviewed and recommended correction of the ingrown toenails. I allowed patient to read consent form and reviewed risk associated with ingrown toenail correction and patient wants procedures and I infiltrated each hallux 60 Milligan's I can Marcaine mixture remove the medial borders exposed matrix and applied phenol 3 applications 30 seconds followed by alcohol lavaged sterile dressing. Gave instructions on soaks and reappoint

## 2017-08-07 ENCOUNTER — Ambulatory Visit: Payer: Self-pay | Admitting: Podiatry

## 2017-08-14 ENCOUNTER — Inpatient Hospital Stay: Payer: Managed Care, Other (non HMO)

## 2017-08-14 ENCOUNTER — Inpatient Hospital Stay (HOSPITAL_BASED_OUTPATIENT_CLINIC_OR_DEPARTMENT_OTHER): Payer: Managed Care, Other (non HMO) | Admitting: Medical

## 2017-08-14 ENCOUNTER — Telehealth: Payer: Self-pay | Admitting: *Deleted

## 2017-08-14 ENCOUNTER — Inpatient Hospital Stay: Payer: Managed Care, Other (non HMO) | Attending: Internal Medicine

## 2017-08-14 VITALS — BP 141/101 | HR 77 | Temp 98.2°F | Resp 18 | Ht 63.0 in | Wt 146.8 lb

## 2017-08-14 DIAGNOSIS — M25551 Pain in right hip: Secondary | ICD-10-CM | POA: Insufficient documentation

## 2017-08-14 DIAGNOSIS — R221 Localized swelling, mass and lump, neck: Secondary | ICD-10-CM | POA: Diagnosis not present

## 2017-08-14 DIAGNOSIS — R590 Localized enlarged lymph nodes: Secondary | ICD-10-CM | POA: Diagnosis not present

## 2017-08-14 DIAGNOSIS — C3411 Malignant neoplasm of upper lobe, right bronchus or lung: Secondary | ICD-10-CM

## 2017-08-14 DIAGNOSIS — Z95828 Presence of other vascular implants and grafts: Secondary | ICD-10-CM

## 2017-08-14 LAB — CBC WITH DIFFERENTIAL (CANCER CENTER ONLY)
Basophils Absolute: 0 10*3/uL (ref 0.0–0.1)
Basophils Relative: 0 %
EOS PCT: 2 %
Eosinophils Absolute: 0.1 10*3/uL (ref 0.0–0.5)
HEMATOCRIT: 40.3 % (ref 34.8–46.6)
Hemoglobin: 13.5 g/dL (ref 11.6–15.9)
LYMPHS ABS: 1.6 10*3/uL (ref 0.9–3.3)
LYMPHS PCT: 32 %
MCH: 31.6 pg (ref 25.1–34.0)
MCHC: 33.5 g/dL (ref 31.5–36.0)
MCV: 94.4 fL (ref 79.5–101.0)
MONO ABS: 0.4 10*3/uL (ref 0.1–0.9)
Monocytes Relative: 9 %
NEUTROS ABS: 2.8 10*3/uL (ref 1.5–6.5)
Neutrophils Relative %: 57 %
PLATELETS: 253 10*3/uL (ref 145–400)
RBC: 4.27 MIL/uL (ref 3.70–5.45)
RDW: 13.4 % (ref 11.2–16.1)
WBC Count: 4.9 10*3/uL (ref 3.9–10.3)

## 2017-08-14 LAB — CMP (CANCER CENTER ONLY)
ALBUMIN: 3.9 g/dL (ref 3.5–5.0)
ALT: 20 U/L (ref 0–55)
ANION GAP: 8 (ref 3–11)
AST: 21 U/L (ref 5–34)
Alkaline Phosphatase: 39 U/L — ABNORMAL LOW (ref 40–150)
BILIRUBIN TOTAL: 0.4 mg/dL (ref 0.2–1.2)
BUN: 16 mg/dL (ref 7–26)
CO2: 26 mmol/L (ref 22–29)
Calcium: 9.6 mg/dL (ref 8.4–10.4)
Chloride: 107 mmol/L (ref 98–109)
Creatinine: 0.77 mg/dL (ref 0.60–1.10)
GFR, Est AFR Am: >60 mL/min (ref >60)
GFR, Estimated: >60 mL/min (ref >60)
GLUCOSE: 93 mg/dL (ref 70–140)
POTASSIUM: 4.4 mmol/L (ref 3.3–4.7)
SODIUM: 141 mmol/L (ref 136–145)
Total Protein: 6.6 g/dL (ref 6.4–8.3)

## 2017-08-14 MED ORDER — SODIUM CHLORIDE 0.9 % IJ SOLN
10.0000 mL | INTRAMUSCULAR | Status: DC | PRN
  Administered 2017-08-14: 10 mL via INTRAVENOUS
  Filled 2017-08-14: qty 10

## 2017-08-14 NOTE — Telephone Encounter (Signed)
Pt in waiting room states she needs to be seen because of a lump in her throat.  Message to scheduling for pt to have Lab and [facility]  Visit.

## 2017-08-14 NOTE — Progress Notes (Signed)
[facility] Progress Note   Rose Figueroa [id number] 09-10-1955 62 y.o.  Rose Figueroa is managed by Dr. Fanny Bien. Mohamed  Actively treated with chemotherapy: no  Current Therapy: observation  Assessment: Plan:    Cancer of upper lobe of right lung ([place]) - Plan: CT Abdomen Pelvis W Wo Contrast, CT SOFT TISSUE NECK W WO CONTRAST, CT CHEST W WO CONTRAST  Lymphadenopathy, supraclavicular - Plan: CT Abdomen Pelvis W Wo Contrast, CT SOFT TISSUE NECK W WO CONTRAST, CT CHEST W WO CONTRAST  Right hip pain - Plan: CT Abdomen Pelvis W Wo Contrast, CT CHEST W WO CONTRAST  Localized swelling, mass or lump of neck - Plan: CT Abdomen Pelvis W Wo Contrast, CT SOFT TISSUE NECK W WO CONTRAST, CT CHEST W WO CONTRAST   Cancer of the right upper lobe with new left supraclavicular lymphadenopathy: The patient is referred for restaging CT scans of the chest, abdomen, pelvis, and neck.  She has been scheduled to return to see Dr. Julien Nordmann in 2 weeks.  Right hip pain: Patient was instructed to take Motrin with food several times daily.  We will await her restaging scans with consideration given for a bone scan pending her CT scan results.  Please see After Visit Summary for patient specific instructions.  Future Appointments  Date Time Provider [place]  09/26/2017  9:00 AM [facility] 4 [facility] None  09/28/2017  9:45 AM Curt Bears, MD [facility] None    Orders Placed This Encounter  Procedures  . CT Abdomen Pelvis W Wo Contrast  . CT SOFT TISSUE NECK W WO CONTRAST  . CT CHEST W WO CONTRAST       Subjective:   Patient ID:  Rose Figueroa is a 62 y.o. (DOB 01-Feb-1956) female.  Chief Complaint:  Chief Complaint  Patient presents with  . Mass    left clavicular area    HPI Rose Figueroa has a history of a stage IIIB (T2a, N3, M0) non-small cell lung cancer, adenocarcinoma with negative EGFR mutation and negative ALK gene translocation which was  diagnosed in October 2016. She was treated with concurrent chemoradiation with weekly carboplatin for AUC and paclitaxel for 6 cycles. She was last treated on 07/13/2015 and achieved a partial response. She was next treated withy carboplatin and Alimta which was dosed every 3 weeks and was first given on 11/02/2015. She is status post 6 cycles and is currently managed with observation. She was last seen by Dr. Julien Nordmann on 03/29/2017. Her most recent CT scan of the chest from 03/20/2017 showed post treatment volume loss in the right hemithorax with no evidence of metastatic disease. There was a small amount of pericardial fluid which was unchanged. She presents to the office today with a report of a newly identified left supraclavicular lymph node.  She also reports having an increase in a nonproductive cough over the past several months.  Her cough is generally worse in the morning.  She also has rhinorrhea.  Her cough is nonproductive.  She continues to take Zyrtec.  She also reports having pain in her right lateral hip with radiation inferiorly along her posterior right thigh.  She states that her back tightens up at times.  She has had no changes in activity and denies trauma.  Her energy level is slightly lower.  She was previously scheduled to have restaging scans in March anf follow-up with Dr. Julien Nordmann after scans she denies fevers, chills, anorexia, or weight changes.  Medications: I have  reviewed the patient's current medications.  Allergies: No Known Allergies  Past Medical History:  Diagnosis Date  . Anxiety   . Back pain    right back lateral  . BCC (basal cell carcinoma of skin)   . Colon polyps   . Complication of anesthesia    pt. states that she is difficult to arouse  . Complication of anesthesia    "indigestion and burping " after eating.  . Diverticulosis   . Dysrhythmia    "skip beat in early 20's"  . H/O cold sores   . History of anemia   . Lung cancer ([facility])    RUL mass  -being tx- radiation and chemo- remains last chemo 01-04-16  . Osteoporosis   . PONV (postoperative nausea and vomiting)   . Rectal bleeding   . right upper lobe lung mass 04/15/2015   malignant tumor found - 10'16-radiation and chemotherapy and remains with chemo at present- Dr. Mohammed follows.    Past Surgical History:  Procedure Laterality Date  . COLONOSCOPY    . COLONOSCOPY W/ POLYPECTOMY    . COLONOSCOPY WITH PROPOFOL N/A 01/20/2016   Procedure: COLONOSCOPY WITH PROPOFOL;  Surgeon: William Outlaw, MD;  Location: [facility];  Service: Endoscopy;  Laterality: N/A;  . CYSTECTOMY     right breast x 2  . MEDIASTINOSCOPY N/A 05/07/2015   Procedure: MEDIASTINOSCOPY;  Surgeon: Peter Van Trigt, MD;  Location: [facility];  Service: Thoracic;  Laterality: N/A;  . TONSILLECTOMY    . VIDEO BRONCHOSCOPY WITH ENDOBRONCHIAL ULTRASOUND N/A 05/07/2015   Procedure: VIDEO BRONCHOSCOPY WITH ENDOBRONCHIAL ULTRASOUND;  Surgeon: Peter Van Trigt, MD;  Location: [facility];  Service: Thoracic;  Laterality: N/A;    No family history on file.  Social History   Socioeconomic History  . Marital status: Married    Spouse name: Not on file  . Number of children: Not on file  . Years of education: Not on file  . Highest education level: Not on file  Social Needs  . Financial resource strain: Not on file  . Food insecurity - worry: Not on file  . Food insecurity - inability: Not on file  . Transportation needs - medical: Not on file  . Transportation needs - non-medical: Not on file  Occupational History  . Occupation: retired  Tobacco Use  . Smoking status: Former Smoker    Packs/day: 1.00    Years: 5.00    Pack years: 5.00    Types: Cigarettes    Last attempt to quit: 07/26/1975    Years since quitting: 42.0  . Smokeless tobacco: Never Used  Substance and Sexual Activity  . Alcohol use: Yes    Alcohol/week: 0.0 oz    Comment: occ-. none now.  . Drug use: No  . Sexual activity: Not on file  Other  Topics Concern  . Not on file  Social History Narrative  . Not on file    Past Medical History, Surgical history, Social history, and Family history were reviewed and updated as appropriate.   Please see review of systems for further details on the patient's review from today.   Review of Systems:  Review of Systems  Constitutional: Positive for fatigue. Negative for activity change, appetite change, chills, diaphoresis, fever and unexpected weight change.  HENT: Positive for rhinorrhea. Negative for congestion, sinus pressure and sinus pain.   Respiratory: Positive for cough. Negative for choking, chest tightness and shortness of breath.   Cardiovascular: Negative for chest pain and palpitations.    Musculoskeletal: Positive for back pain and myalgias.  Hematological: Positive for adenopathy (Left supraclavicular).    Objective:   Physical Exam:  BP (!) 141/101 (BP Location: Left Arm, Patient Position: Sitting)   Pulse 77   Temp 98.2 F (36.8 C) (Oral)   Resp 18   Ht 5' 3" (1.6 m)   Wt 146 lb 12.8 oz (66.6 kg)   SpO2 99%   BMI 26.00 kg/m  ECOG: 0  Physical Exam  Constitutional: No distress.  HENT:  Head: Normocephalic and atraumatic.  Eyes: Right eye exhibits no discharge. Left eye exhibits no discharge. No scleral icterus.  Neck: Normal range of motion. Neck supple.  Cardiovascular: Normal rate, regular rhythm and normal heart sounds. Exam reveals no gallop and no friction rub.  No murmur heard. Pulmonary/Chest: Effort normal and breath sounds normal. No respiratory distress. She has no wheezes. She has no rales.  Musculoskeletal: Normal range of motion. She exhibits no tenderness or deformity.       Arms: Lymphadenopathy:    She has cervical adenopathy (1 cm hard left supraclavicular lymph node.).  Neurological: She is alert. Coordination normal.  Skin: Skin is warm and dry. She is not diaphoretic.  Psychiatric: She has a normal mood and affect. Her behavior is  normal. Judgment and thought content normal.    Lab Review:     Component Value Date/Time   NA 141 08/14/2017 1005   NA 139 03/20/2017 1038   K 4.4 08/14/2017 1005   K 4.3 03/20/2017 1038   CL 107 08/14/2017 1005   CO2 26 08/14/2017 1005   CO2 27 03/20/2017 1038   GLUCOSE 93 08/14/2017 1005   GLUCOSE 88 03/20/2017 1038   BUN 16 08/14/2017 1005   BUN 15.4 03/20/2017 1038   CREATININE 0.7 03/20/2017 1038   CALCIUM 9.6 08/14/2017 1005   CALCIUM 9.8 03/20/2017 1038   PROT 6.6 08/14/2017 1005   PROT 6.6 03/20/2017 1038   ALBUMIN 3.9 08/14/2017 1005   ALBUMIN 3.8 03/20/2017 1038   AST 21 08/14/2017 1005   AST 25 03/20/2017 1038   ALT 20 08/14/2017 1005   ALT 22 03/20/2017 1038   ALKPHOS 39 (L) 08/14/2017 1005   ALKPHOS 37 (L) 03/20/2017 1038   BILITOT 0.4 08/14/2017 1005   BILITOT 0.42 03/20/2017 1038   GFRNONAA >60 08/14/2017 1005   GFRAA >60 08/14/2017 1005       Component Value Date/Time   WBC 4.9 08/14/2017 1005   WBC 3.7 (L) 03/20/2017 1037   WBC 2.7 (L) 07/03/2015 1200   RBC 4.27 08/14/2017 1005   HGB 13.3 03/20/2017 1037   HCT 40.3 08/14/2017 1005   HCT 40.1 03/20/2017 1037   PLT 253 08/14/2017 1005   PLT 238 03/20/2017 1037   MCV 94.4 08/14/2017 1005   MCV 93.9 03/20/2017 1037   MCH 31.6 08/14/2017 1005   MCHC 33.5 08/14/2017 1005   RDW 13.4 08/14/2017 1005   RDW 13.8 03/20/2017 1037   LYMPHSABS 1.6 08/14/2017 1005   LYMPHSABS 1.6 03/20/2017 1037   MONOABS 0.4 08/14/2017 1005   MONOABS 0.3 03/20/2017 1037   EOSABS 0.1 08/14/2017 1005   EOSABS 0.1 03/20/2017 1037   BASOSABS 0.0 08/14/2017 1005   BASOSABS 0.0 03/20/2017 1037   -------------------------------  Imaging from last 24 hours (if applicable):  Radiology interpretation: No results found.      This case was discussed with Dr. Julien Nordmann. He expressed agreement with my management of this patient.

## 2017-08-14 NOTE — Patient Instructions (Signed)

## 2017-08-15 ENCOUNTER — Telehealth: Payer: Self-pay | Admitting: Medical

## 2017-08-15 NOTE — Telephone Encounter (Signed)
Called patient regarding follow up with Dr Julien Nordmann

## 2017-08-24 ENCOUNTER — Other Ambulatory Visit: Payer: Self-pay | Admitting: *Deleted

## 2017-08-24 DIAGNOSIS — R221 Localized swelling, mass and lump, neck: Secondary | ICD-10-CM

## 2017-08-25 ENCOUNTER — Ambulatory Visit (HOSPITAL_COMMUNITY)
Admission: RE | Admit: 2017-08-25 | Discharge: 2017-08-25 | Disposition: A | Discharge status: Home / Self Care | Payer: Managed Care, Other (non HMO) | Source: Ambulatory Visit | Attending: Medical | Admitting: Medical

## 2017-08-25 ENCOUNTER — Ambulatory Visit (HOSPITAL_COMMUNITY): Admission: RE | Admit: 2017-08-25 | Payer: Managed Care, Other (non HMO) | Source: Ambulatory Visit

## 2017-08-25 ENCOUNTER — Encounter (HOSPITAL_COMMUNITY): Payer: Self-pay

## 2017-08-25 DIAGNOSIS — R221 Localized swelling, mass and lump, neck: Secondary | ICD-10-CM | POA: Diagnosis present

## 2017-08-25 DIAGNOSIS — R590 Localized enlarged lymph nodes: Secondary | ICD-10-CM | POA: Diagnosis not present

## 2017-08-25 DIAGNOSIS — C3490 Malignant neoplasm of unspecified part of unspecified bronchus or lung: Secondary | ICD-10-CM | POA: Diagnosis not present

## 2017-08-25 MED ORDER — IOPAMIDOL (ISOVUE-300) INJECTION 61%
30.0000 mL | Freq: Once | INTRAVENOUS | Status: AC | PRN
  Administered 2017-08-25: 30 mL via ORAL

## 2017-08-25 MED ORDER — IOPAMIDOL (ISOVUE-300) INJECTION 61%
INTRAVENOUS | Status: AC
  Filled 2017-08-25: qty 100

## 2017-08-25 MED ORDER — IOPAMIDOL (ISOVUE-300) INJECTION 61%
INTRAVENOUS | Status: AC
  Filled 2017-08-25: qty 30

## 2017-08-25 MED ORDER — IOPAMIDOL (ISOVUE-300) INJECTION 61%
100.0000 mL | Freq: Once | INTRAVENOUS | Status: AC | PRN
  Administered 2017-08-25: 100 mL via INTRAVENOUS

## 2017-08-29 ENCOUNTER — Encounter: Payer: Self-pay | Admitting: *Deleted

## 2017-08-29 ENCOUNTER — Inpatient Hospital Stay: Payer: Managed Care, Other (non HMO)

## 2017-08-29 ENCOUNTER — Inpatient Hospital Stay: Payer: Managed Care, Other (non HMO) | Attending: Internal Medicine | Admitting: Internal Medicine

## 2017-08-29 ENCOUNTER — Encounter: Payer: Self-pay | Admitting: Internal Medicine

## 2017-08-29 ENCOUNTER — Telehealth: Payer: Self-pay | Admitting: Internal Medicine

## 2017-08-29 VITALS — BP 136/83 | HR 90 | Temp 97.8°F | Resp 18 | Ht 63.0 in | Wt 146.4 lb

## 2017-08-29 DIAGNOSIS — C3411 Malignant neoplasm of upper lobe, right bronchus or lung: Secondary | ICD-10-CM

## 2017-08-29 DIAGNOSIS — C259 Malignant neoplasm of pancreas, unspecified: Secondary | ICD-10-CM | POA: Diagnosis not present

## 2017-08-29 DIAGNOSIS — R221 Localized swelling, mass and lump, neck: Secondary | ICD-10-CM

## 2017-08-29 DIAGNOSIS — C3490 Malignant neoplasm of unspecified part of unspecified bronchus or lung: Secondary | ICD-10-CM | POA: Insufficient documentation

## 2017-08-29 DIAGNOSIS — M545 Low back pain: Secondary | ICD-10-CM

## 2017-08-29 LAB — CBC WITH DIFFERENTIAL/PLATELET
Basophils Absolute: 0 10*3/uL (ref 0.0–0.1)
Basophils Relative: 1 %
Eosinophils Absolute: 0.1 10*3/uL (ref 0.0–0.5)
Eosinophils Relative: 3 %
HEMATOCRIT: 40.3 % (ref 34.8–46.6)
HEMOGLOBIN: 13.5 g/dL (ref 11.6–15.9)
LYMPHS ABS: 1.2 10*3/uL (ref 0.9–3.3)
LYMPHS PCT: 42 %
MCH: 31.4 pg (ref 25.1–34.0)
MCHC: 33.4 g/dL (ref 31.5–36.0)
MCV: 94.1 fL (ref 79.5–101.0)
Monocytes Absolute: 0.3 10*3/uL (ref 0.1–0.9)
Monocytes Relative: 12 %
NEUTROS ABS: 1.2 10*3/uL — AB (ref 1.5–6.5)
NEUTROS PCT: 42 %
Platelets: 227 10*3/uL (ref 145–400)
RBC: 4.29 MIL/uL (ref 3.70–5.45)
RDW: 13.6 % (ref 11.2–14.5)
WBC: 3 10*3/uL — AB (ref 3.9–10.3)

## 2017-08-29 LAB — COMPREHENSIVE METABOLIC PANEL
ALT: 16 U/L (ref 0–55)
AST: 23 U/L (ref 5–34)
Albumin: 4 g/dL (ref 3.5–5.0)
Alkaline Phosphatase: 34 U/L — ABNORMAL LOW (ref 40–150)
Anion gap: 7 (ref 3–11)
BUN: 14 mg/dL (ref 7–26)
CHLORIDE: 105 mmol/L (ref 98–109)
CO2: 28 mmol/L (ref 22–29)
Calcium: 9.2 mg/dL (ref 8.4–10.4)
Creatinine, Ser: 0.81 mg/dL (ref 0.60–1.10)
Glucose, Bld: 81 mg/dL (ref 70–140)
POTASSIUM: 4.2 mmol/L (ref 3.5–5.1)
SODIUM: 140 mmol/L (ref 136–145)
Total Bilirubin: 0.4 mg/dL (ref 0.2–1.2)
Total Protein: 6.5 g/dL (ref 6.4–8.3)

## 2017-08-29 NOTE — Progress Notes (Signed)
.      [facility] [phone]   [phone]  OFFICE PROGRESS NOTE  [name], DO [address] [place] [address]  DIAGNOSIS: Stage IIIB (T2a, N3, M0) non-small cell lung cancer, adenocarcinoma with negative EGFR mutation and negative ALK gene translocation diagnosed in October 2016.  PRIOR THERAPY:  1) Concurrent chemoradiation with weekly carboplatin for AUC of 2 and paclitaxel 45 MG/M2 status post 6 cycles. Last dose was given 07/13/2015 with partial response. 2) Systemic chemotherapy with carboplatin for AUC of 5 and Alimta 500 MG/M2 every 3 weeks. First dose 11/02/2015. Status post 6 cycles.  CURRENT THERAPY: Observation.  INTERVAL HISTORY: Rose Figueroa 62 y.o. female returns to the clinic today for follow-up visit accompanied by her husband.  The patient has been complaining of low back pain for a few weeks.  She also noticed a swelling in the left neck area.  She denied having any current chest pain, shortness of breath, cough or hemoptysis.  She denied having any weight loss or night sweats.  She has no nausea, vomiting, diarrhea or constipation.  She had repeat CT scan of the neck, chest, abdomen and pelvis performed recently and she is here for evaluation and discussion of her scan results.  MEDICAL HISTORY: Past Medical History:  Diagnosis Date  . Anxiety   . Back pain    right back lateral  . BCC (basal cell carcinoma of skin)   . Colon polyps   . Complication of anesthesia    pt. states that she is difficult to arouse  . Complication of anesthesia    "indigestion and burping " after eating.  . Diverticulosis   . Dysrhythmia    "skip beat in early 20's"  . H/O cold sores   . History of anemia   . Lung cancer ([place])    RUL mass -being tx- radiation and chemo- remains last chemo 01-04-16  . Osteoporosis   . PONV (postoperative nausea and vomiting)   . Rectal bleeding   . right upper lobe lung mass 04/15/2015   malignant  tumor found - 10'16-radiation and chemotherapy and remains with chemo at present- Dr. Earlie Server follows.    ALLERGIES:  has No Known Allergies.  MEDICATIONS:  Current Outpatient Medications  Medication Sig Dispense Refill  . Biotin 5000 MCG CAPS Take 5,000 mcg by mouth daily.    . Calcium Carb-Cholecalciferol (CALCIUM 1000 + D PO) Take 4 tablets by mouth daily. D3 = " 25 mcg"    . cetirizine (ZYRTEC) 10 MG tablet Take 10 mg by mouth daily.     Marland Kitchen L-LYSINE PO Take 3,000 mg by mouth daily.     Marland Kitchen lidocaine-prilocaine (EMLA) cream Apply 1 application topically as needed. 30 g 0  . Misc Natural Products (TART CHERRY ADVANCED PO) Take 1,200 mg by mouth daily.    . Multiple Vitamins-Minerals (CENTRUM SILVER PO) Take 1 tablet by mouth daily.    . Omega-3 Fatty Acids (RA FISH OIL) 1400 MG CPDR Take 2 capsules by mouth daily.     Marland Kitchen OVER THE COUNTER MEDICATION Take 1,000 mg by mouth daily. "spirolina'    . Turmeric Curcumin 500 MG CAPS Take 1 each by mouth daily.     . vitamin B-12 (CYANOCOBALAMIN) 1000 MCG tablet Take 1,000 mcg by mouth daily.     No current facility-administered medications for this visit.     SURGICAL HISTORY:  Past Surgical History:  Procedure Laterality Date  .  COLONOSCOPY    . COLONOSCOPY W/ POLYPECTOMY    . COLONOSCOPY WITH PROPOFOL N/A 01/20/2016   Procedure: COLONOSCOPY WITH PROPOFOL;  Surgeon: Arta Silence, MD;  Location: [facility];  Service: Endoscopy;  Laterality: N/A;  . CYSTECTOMY     right breast x 2  . MEDIASTINOSCOPY N/A 05/07/2015   Procedure: MEDIASTINOSCOPY;  Surgeon: Ivin Poot, MD;  Location: [place];  Service: Thoracic;  Laterality: N/A;  . TONSILLECTOMY    . VIDEO BRONCHOSCOPY WITH ENDOBRONCHIAL ULTRASOUND N/A 05/07/2015   Procedure: VIDEO BRONCHOSCOPY WITH ENDOBRONCHIAL ULTRASOUND;  Surgeon: Ivin Poot, MD;  Location: [facility];  Service: Thoracic;  Laterality: N/A;    REVIEW OF SYSTEMS:  Constitutional: negative Eyes: negative Ears, nose,  mouth, throat, and face: negative Respiratory: negative Cardiovascular: negative Gastrointestinal: negative Genitourinary:negative Integument/breast: negative Hematologic/lymphatic: negative Musculoskeletal:positive for back pain Neurological: negative Behavioral/Psych: negative Endocrine: negative Allergic/Immunologic: negative   PHYSICAL EXAMINATION: General appearance: alert, cooperative and no distress Head: Normocephalic, without obvious abnormality, atraumatic Neck: no adenopathy, no JVD, supple, symmetrical, trachea midline and thyroid not enlarged, symmetric, no tenderness/mass/nodules Lymph nodes: Cervical, supraclavicular, and axillary nodes normal. Resp: clear to auscultation bilaterally Back: symmetric, no curvature. ROM normal. No CVA tenderness. Cardio: regular rate and rhythm, S1, S2 normal, no murmur, click, rub or gallop GI: soft, non-tender; bowel sounds normal; no masses,  no organomegaly Extremities: extremities normal, atraumatic, no cyanosis or edema Neurologic: Alert and oriented X 3, normal strength and tone. Normal symmetric reflexes. Normal coordination and gait  ECOG PERFORMANCE STATUS: 0 - Asymptomatic  Blood pressure 136/83, pulse 90, temperature 97.8 F (36.6 C), temperature source Oral, resp. rate 18, height 5' 3" (1.6 m), weight 146 lb 6.4 oz (66.4 kg), SpO2 100 %.  LABORATORY DATA: Lab Results  Component Value Date   WBC 4.9 08/14/2017   HGB 13.3 03/20/2017   HCT 40.3 08/14/2017   MCV 94.4 08/14/2017   PLT 253 08/14/2017      Chemistry      Component Value Date/Time   NA 141 08/14/2017 1005   NA 139 03/20/2017 1038   K 4.4 08/14/2017 1005   K 4.3 03/20/2017 1038   CL 107 08/14/2017 1005   CO2 26 08/14/2017 1005   CO2 27 03/20/2017 1038   BUN 16 08/14/2017 1005   BUN 15.4 03/20/2017 1038   CREATININE 0.7 03/20/2017 1038      Component Value Date/Time   CALCIUM 9.6 08/14/2017 1005   CALCIUM 9.8 03/20/2017 1038   ALKPHOS 39 (L)  08/14/2017 1005   ALKPHOS 37 (L) 03/20/2017 1038   AST 21 08/14/2017 1005   AST 25 03/20/2017 1038   ALT 20 08/14/2017 1005   ALT 22 03/20/2017 1038   BILITOT 0.4 08/14/2017 1005   BILITOT 0.42 03/20/2017 1038       RADIOGRAPHIC STUDIES: Ct Soft Tissue Neck W Contrast  Result Date: 08/25/2017 CLINICAL DATA:  Localized swelling. Restaging lung cancer. New left supraclavicular lymph node. EXAM: CT NECK WITH CONTRAST TECHNIQUE: Multidetector CT imaging of the neck was performed using the standard protocol following the bolus administration of intravenous contrast. CONTRAST:  172m ISOVUE-300 IV COMPARISON:  Chest CT 03/20/2017 FINDINGS: Pharynx and larynx: Normal. No mass or swelling. Salivary glands: No inflammation, mass, or stone. Thyroid: Normal. Lymph nodes: There is a left supraclavicular lymph node with mild rounding that measures up to 10 x 9 mm. This node was likely not seen on multiple prior chest CTs. Enlarged node was not seen in this location  on the 2016 PET-CT. Slightly more inferiorly is also an additional rounded node measuring 5 mm diameter. No contralateral or more cranial adenopathy. Vascular: Mild atherosclerotic calcification. Limited intracranial: Negative Visualized orbits: Negative Mastoids and visualized paranasal sinuses: Negative Skeleton: No acute or aggressive finding Upper chest: Reported separately IMPRESSION: 1. The palpable concern reflects a rounded 1 cm left supraclavicular node. Although no specific malignant features and only mild enlargement, this is reportedly hard on exam and is new from PET-CT in 2016. Biopsy or short-term follow-up recommended. 2. Chest CT reported separately. Electronically Signed   By: Monte Fantasia M.D.   On: 08/25/2017 14:32   Ct Chest W Contrast  Result Date: 08/25/2017 CLINICAL DATA:  Non-small cell lung cancer initial diagnosis 2016. Chemotherapy radiation therapy. EXAM: CT CHEST, ABDOMEN, AND PELVIS WITH CONTRAST TECHNIQUE:  Multidetector CT imaging of the chest, abdomen and pelvis was performed following the standard protocol during bolus administration of intravenous contrast. CONTRAST:  140m ISOVUE-300 IOPAMIDOL (ISOVUE-300) INJECTION 61%, 368mISOVUE-300 IOPAMIDOL (ISOVUE-300) INJECTION 61% COMPARISON:  None. FINDINGS: CT CHEST FINDINGS Cardiovascular: Port in the LEFT chest wall with tip in the distal SVC. No significant vascular findings. Normal heart size. No pericardial effusion. Mediastinum/Nodes: Indication no axillary adenopathy. Supraclavicular adenopathy. No mediastinal adenopathy. Hilar adenopathy. Lungs/Pleura: Perihilar consolidation posterior to the RIGHT hilum not changed comparison exam. No new pulmonary nodularity. Postsurgical change in the RIGHT hemithorax. Musculoskeletal: No aggressive osseous lesion. CT ABDOMEN AND PELVIS FINDINGS Hepatobiliary: No focal hepatic lesion. No biliary ductal dilatation. Gallbladder is normal. Common bile duct is normal. Pancreas: Pancreas is normal. No ductal dilatation. No pancreatic inflammation. Spleen: Normal spleen Adrenals/urinary tract: Adrenal glands and kidneys are normal. The ureters and bladder normal. Stomach/Bowel: Stomach, small bowel, appendix, and cecum are normal. The colon and rectosigmoid colon are normal. Vascular/Lymphatic: Abdominal aorta is normal caliber. There is no retroperitoneal or periportal lymphadenopathy. No pelvic lymphadenopathy. Reproductive: Post hysterectomy scratch normal uterus no adnexal abnormality Other: No peritoneal nodularity. Musculoskeletal: No aggressive osseous lesion. IMPRESSION: Chest Impression: . 1. No evidence of lung cancer recurrence in thorax. 2. Stable post therapy change in the LEFT lung hilum. 3. See neck CT for description of LEFT supraclavicular lymph node. Abdomen / Pelvis Impression: No metastatic disease in the abdomen or pelvis. Electronically Signed   By: StSuzy Bouchard.D.   On: 08/25/2017 16:06   Ct Abdomen  Pelvis W Contrast  Result Date: 08/25/2017 CLINICAL DATA:  Non-small cell lung cancer initial diagnosis 2016. Chemotherapy radiation therapy. EXAM: CT CHEST, ABDOMEN, AND PELVIS WITH CONTRAST TECHNIQUE: Multidetector CT imaging of the chest, abdomen and pelvis was performed following the standard protocol during bolus administration of intravenous contrast. CONTRAST:  10043mSOVUE-300 IOPAMIDOL (ISOVUE-300) INJECTION 61%, 61m18mOVUE-300 IOPAMIDOL (ISOVUE-300) INJECTION 61% COMPARISON:  None. FINDINGS: CT CHEST FINDINGS Cardiovascular: Port in the LEFT chest wall with tip in the distal SVC. No significant vascular findings. Normal heart size. No pericardial effusion. Mediastinum/Nodes: Indication no axillary adenopathy. Supraclavicular adenopathy. No mediastinal adenopathy. Hilar adenopathy. Lungs/Pleura: Perihilar consolidation posterior to the RIGHT hilum not changed comparison exam. No new pulmonary nodularity. Postsurgical change in the RIGHT hemithorax. Musculoskeletal: No aggressive osseous lesion. CT ABDOMEN AND PELVIS FINDINGS Hepatobiliary: No focal hepatic lesion. No biliary ductal dilatation. Gallbladder is normal. Common bile duct is normal. Pancreas: Pancreas is normal. No ductal dilatation. No pancreatic inflammation. Spleen: Normal spleen Adrenals/urinary tract: Adrenal glands and kidneys are normal. The ureters and bladder normal. Stomach/Bowel: Stomach, small bowel, appendix, and cecum are normal. The colon  and rectosigmoid colon are normal. Vascular/Lymphatic: Abdominal aorta is normal caliber. There is no retroperitoneal or periportal lymphadenopathy. No pelvic lymphadenopathy. Reproductive: Post hysterectomy scratch normal uterus no adnexal abnormality Other: No peritoneal nodularity. Musculoskeletal: No aggressive osseous lesion. IMPRESSION: Chest Impression: . 1. No evidence of lung cancer recurrence in thorax. 2. Stable post therapy change in the LEFT lung hilum. 3. See neck CT for  description of LEFT supraclavicular lymph node. Abdomen / Pelvis Impression: No metastatic disease in the abdomen or pelvis. Electronically Signed   By: Suzy Bouchard M.D.   On: 08/25/2017 16:06   ASSESSMENT AND PLAN:  This is a very pleasant 62 years old white female with a stage IIIB non-small cell lung cancer, adenocarcinoma with negative EGFR, ALK mutations diagnosed in October 2016. She is status post course of concurrent chemoradiation followed by consolidation chemotherapy with carboplatin and Alimta. The patient has been in observation for close to 2 years. Recent imaging studies including CT scan of the neck showed enlarged left supraclavicular lymph node suspicious for disease recurrence. I personally and independently reviewed the scan images and discussed the results and showed the images to the patient and her husband. I recommended for the patient to have ultrasound-guided core biopsy of the enlarged left supraclavicular lymph node to rule out recurrent malignancy. I will see her back for follow-up visit in 1 week for reevaluation and more detailed discussion of her treatment options based on the biopsy results. She was advised to call immediately if she has any concerning symptoms in the interval. The patient voices understanding of current disease status and treatment options and is in agreement with the current care plan. All questions were answered. The patient knows to call the clinic with any problems, questions or concerns. We can certainly see the patient much sooner if necessary. I spent 15 minutes counseling the patient face to face. The total time spent in the appointment was 25 minutes.  Disclaimer: This note was dictated with voice recognition software. Similar sounding words can inadvertently be transcribed and may not be corrected upon review.

## 2017-08-29 NOTE — Telephone Encounter (Signed)
Scheduled appt per 2/5 los - Gave patient AVS and calender per los. Central radiology to contact patient with [place].

## 2017-09-01 ENCOUNTER — Other Ambulatory Visit: Payer: Self-pay | Admitting: Radiology

## 2017-09-04 ENCOUNTER — Other Ambulatory Visit: Payer: Self-pay | Admitting: Student

## 2017-09-04 ENCOUNTER — Other Ambulatory Visit: Payer: Self-pay | Admitting: General Surgery

## 2017-09-04 ENCOUNTER — Telehealth: Payer: Self-pay | Admitting: Internal Medicine

## 2017-09-04 NOTE — Telephone Encounter (Signed)
Returned patients call regarding upcoming February appointment updates. Patient r/s because biopsy was same day as office visit.

## 2017-09-05 ENCOUNTER — Ambulatory Visit: Payer: Medicare Other | Admitting: Oncology

## 2017-09-05 ENCOUNTER — Encounter (HOSPITAL_COMMUNITY): Payer: Self-pay

## 2017-09-05 ENCOUNTER — Ambulatory Visit (HOSPITAL_COMMUNITY)
Admission: RE | Admit: 2017-09-05 | Discharge: 2017-09-05 | Disposition: A | Discharge status: Home / Self Care | Payer: Managed Care, Other (non HMO) | Source: Ambulatory Visit | Attending: Internal Medicine | Admitting: Internal Medicine

## 2017-09-05 DIAGNOSIS — R590 Localized enlarged lymph nodes: Secondary | ICD-10-CM | POA: Insufficient documentation

## 2017-09-05 DIAGNOSIS — C7989 Secondary malignant neoplasm of other specified sites: Secondary | ICD-10-CM | POA: Insufficient documentation

## 2017-09-05 DIAGNOSIS — Z86010 Personal history of colonic polyps: Secondary | ICD-10-CM | POA: Diagnosis not present

## 2017-09-05 DIAGNOSIS — Z87891 Personal history of nicotine dependence: Secondary | ICD-10-CM | POA: Diagnosis not present

## 2017-09-05 DIAGNOSIS — Z85828 Personal history of other malignant neoplasm of skin: Secondary | ICD-10-CM | POA: Insufficient documentation

## 2017-09-05 DIAGNOSIS — Z923 Personal history of irradiation: Secondary | ICD-10-CM | POA: Insufficient documentation

## 2017-09-05 DIAGNOSIS — C3411 Malignant neoplasm of upper lobe, right bronchus or lung: Secondary | ICD-10-CM | POA: Diagnosis present

## 2017-09-05 DIAGNOSIS — C770 Secondary and unspecified malignant neoplasm of lymph nodes of head, face and neck: Secondary | ICD-10-CM | POA: Diagnosis not present

## 2017-09-05 DIAGNOSIS — F419 Anxiety disorder, unspecified: Secondary | ICD-10-CM | POA: Insufficient documentation

## 2017-09-05 LAB — CBC
HCT: 41.2 % (ref 36.0–46.0)
HEMOGLOBIN: 13.8 g/dL (ref 12.0–15.0)
MCH: 31.6 pg (ref 26.0–34.0)
MCHC: 33.5 g/dL (ref 30.0–36.0)
MCV: 94.3 fL (ref 78.0–100.0)
Platelets: 239 10*3/uL (ref 150–400)
RBC: 4.37 MIL/uL (ref 3.87–5.11)
RDW: 13.2 % (ref 11.5–15.5)
WBC: 3.9 10*3/uL — ABNORMAL LOW (ref 4.0–10.5)

## 2017-09-05 LAB — PROTIME-INR
INR: 0.98
PROTHROMBIN TIME: 12.9 s (ref 11.4–15.2)

## 2017-09-05 MED ORDER — FENTANYL CITRATE (PF) 100 MCG/2ML IJ SOLN
INTRAMUSCULAR | Status: AC
  Filled 2017-09-05: qty 4

## 2017-09-05 MED ORDER — LIDOCAINE HCL (PF) 1 % IJ SOLN
INTRAMUSCULAR | Status: AC
  Filled 2017-09-05: qty 30

## 2017-09-05 MED ORDER — MIDAZOLAM HCL 2 MG/2ML IJ SOLN
INTRAMUSCULAR | Status: AC
  Filled 2017-09-05: qty 4

## 2017-09-05 MED ORDER — SODIUM CHLORIDE 0.9 % IV SOLN: INTRAVENOUS | Status: DC

## 2017-09-05 NOTE — Discharge Instructions (Signed)
Needle Biopsy, Care After These instructions give you information about caring for yourself after your procedure. Your doctor may also give you more specific instructions. Call your doctor if you have any problems or questions after your procedure. Follow these instructions at home:  Rest as told by your doctor.  Take medicines only as told by your doctor.  There are many different ways to close and cover the biopsy site, including stitches (sutures), skin glue, and adhesive strips. Follow instructions from your doctor about: ? How to take care of your biopsy site. ? When and how you should change your bandage (dressing). ? When you should remove your dressing. ? Removing whatever was used to close your biopsy site.  Check your biopsy site every day for signs of infection. Watch for: ? Redness, swelling, or pain. ? Fluid, blood, or pus. Contact a doctor if:  You have a fever.  You have redness, swelling, or pain at the biopsy site, and it lasts longer than a few days.  You have fluid, blood, or pus coming from the biopsy site.  You feel sick to your stomach (nauseous).  You throw up (vomit). Get help right away if:  You are short of breath.  You have trouble breathing.  Your chest hurts.  You feel dizzy or you pass out (faint).  You have bleeding that does not stop with pressure or a bandage.  You cough up blood.  Your belly (abdomen) hurts. This information is not intended to replace advice given to you by your health care provider. Make sure you discuss any questions you have with your health care provider. Document Released: 06/23/2008 Document Revised: 12/17/2015 Document Reviewed: 07/07/2014 Elsevier Interactive Patient Education  [employer].

## 2017-09-05 NOTE — H&P (Signed)
Chief Complaint: Patient was seen in consultation today for left supraclavicular lymph node biopsy at the request of [facility]  Referring Physician(s): Mohamed,Mohamed  Supervising Physician: Markus Daft  Patient Status: [facility] - Out-pt  History of Present Illness: Rose Figueroa is a 62 y.o. female   Hx Non small cell lung cancer - 2016 Follows with Dr Julien Nordmann Seen on routine CT was SCLN  CT ST neck 08/25/17: IMPRESSION: 1. The palpable concern reflects a rounded 1 cm left supraclavicular node. Although no specific malignant features and only mild enlargement, this is reportedly hard on exam and is new from PET-CT in 2016. Biopsy or short-term follow-up recommended  Now scheduled for LAN biopsy    Past Medical History:  Diagnosis Date  . Anxiety   . Back pain    right back lateral  . BCC (basal cell carcinoma of skin)   . Colon polyps   . Complication of anesthesia    pt. states that she is difficult to arouse  . Complication of anesthesia    "indigestion and burping " after eating.  . Diverticulosis   . Dysrhythmia    "skip beat in early 20's"  . H/O cold sores   . History of anemia   . Lung cancer ([place])    RUL mass -being tx- radiation and chemo- remains last chemo 01-04-16  . Osteoporosis   . PONV (postoperative nausea and vomiting)   . Rectal bleeding   . right upper lobe lung mass 04/15/2015   malignant tumor found - 10'16-radiation and chemotherapy and remains with chemo at present- Dr. Earlie Server follows.    Past Surgical History:  Procedure Laterality Date  . COLONOSCOPY    . COLONOSCOPY W/ POLYPECTOMY    . COLONOSCOPY WITH PROPOFOL N/A 01/20/2016   Procedure: COLONOSCOPY WITH PROPOFOL;  Surgeon: Arta Silence, MD;  Location: [facility];  Service: Endoscopy;  Laterality: N/A;  . CYSTECTOMY     right breast x 2  . MEDIASTINOSCOPY N/A 05/07/2015   Procedure: MEDIASTINOSCOPY;  Surgeon: Ivin Poot, MD;  Location: [place];  Service: Thoracic;   Laterality: N/A;  . TONSILLECTOMY    . VIDEO BRONCHOSCOPY WITH ENDOBRONCHIAL ULTRASOUND N/A 05/07/2015   Procedure: VIDEO BRONCHOSCOPY WITH ENDOBRONCHIAL ULTRASOUND;  Surgeon: Ivin Poot, MD;  Location: [facility] OR;  Service: Thoracic;  Laterality: N/A;    Allergies: Patient has no known allergies.  Medications: Prior to Admission medications   Medication Sig Start Date End Date Taking? Authorizing Provider  Biotin 5000 MCG CAPS Take 5,000 mcg by mouth daily.   Yes [provider]  Calcium Carb-Cholecalciferol (CALCIUM 1000 + D PO) Take 2 tablets by mouth 2 (two) times daily. D3 = " 25 mcg"   Yes [provider]  cetirizine (ZYRTEC) 10 MG tablet Take 10 mg by mouth daily.    Yes [provider]  L-LYSINE PO Take 3,000 mg by mouth daily.    Yes [provider]  lidocaine-prilocaine (EMLA) cream Apply 1 application topically as needed. 07/12/17  Yes Curt Bears, MD  Misc Natural Products (TART CHERRY ADVANCED PO) Take 1,000 mg by mouth daily.    Yes [provider]  Multiple Vitamins-Minerals (CENTRUM SILVER PO) Take 1 tablet by mouth daily.   Yes [provider]  Omega-3 Fatty Acids (RA FISH OIL) 1400 MG CPDR Take 1 capsule by mouth 2 (two) times daily.    Yes [provider]  OVER THE COUNTER MEDICATION Take 1,000 mg by mouth daily. "spirolina'  Yes [provider]  psyllium (METAMUCIL) 58.6 % powder Take 1 packet by mouth 3 (three) times daily.   Yes [provider]  Turmeric Curcumin 500 MG CAPS Take 1 each by mouth daily.    Yes [provider]  vitamin B-12 (CYANOCOBALAMIN) 1000 MCG tablet Take 1,000 mcg by mouth daily.   Yes [provider]     History reviewed. No pertinent family history.  Social History   Socioeconomic History  . Marital status: Married    Spouse name: None  . Number of children: None  . Years of education: None  . Highest education level: None  Social  Needs  . Financial resource strain: None  . Food insecurity - worry: None  . Food insecurity - inability: None  . Transportation needs - medical: None  . Transportation needs - non-medical: None  Occupational History  . Occupation: retired  Tobacco Use  . Smoking status: Former Smoker    Packs/day: 1.00    Years: 5.00    Pack years: 5.00    Types: Cigarettes    Last attempt to quit: 07/26/1975    Years since quitting: 42.1  . Smokeless tobacco: Never Used  Substance and Sexual Activity  . Alcohol use: Yes    Alcohol/week: 0.0 oz    Comment: occ-. none now.  . Drug use: No  . Sexual activity: None  Other Topics Concern  . None  Social History Narrative  . None    Review of Systems: A 12 point ROS discussed and pertinent positives are indicated in the HPI above.  All other systems are negative.  Review of Systems  Constitutional: Negative for activity change, fatigue and fever.  HENT: Negative for sore throat and trouble swallowing.   Respiratory: Negative for cough and shortness of breath.   Neurological: Negative for weakness.  Psychiatric/Behavioral: Negative for behavioral problems and confusion.    Vital Signs: BP (!) 126/95   Pulse 75   Temp 98.4 F (36.9 C) (Oral)   Ht 5\' 3"  (1.6 m)   Wt 145 lb (65.8 kg)   SpO2 100%   BMI 25.69 kg/m   Physical Exam  Constitutional: She is oriented to person, place, and time.  Cardiovascular: Normal rate and regular rhythm.  Pulmonary/Chest: Effort normal.  Abdominal: Soft. Bowel sounds are normal.  Musculoskeletal: Normal range of motion.  Neurological: She is alert and oriented to person, place, and time.  Skin: Skin is warm.  Psychiatric: She has a normal mood and affect. Her behavior is normal. Judgment and thought content normal.  Nursing note and vitals reviewed.   Imaging: Ct Soft Tissue Neck W Contrast  Result Date: 08/25/2017 CLINICAL DATA:  Localized swelling. Restaging lung cancer. New left supraclavicular  lymph node. EXAM: CT NECK WITH CONTRAST TECHNIQUE: Multidetector CT imaging of the neck was performed using the standard protocol following the bolus administration of intravenous contrast. CONTRAST:  152mL ISOVUE-300 IV COMPARISON:  Chest CT 03/20/2017 FINDINGS: Pharynx and larynx: Normal. No mass or swelling. Salivary glands: No inflammation, mass, or stone. Thyroid: Normal. Lymph nodes: There is a left supraclavicular lymph node with mild rounding that measures up to 10 x 9 mm. This node was likely not seen on multiple prior chest CTs. Enlarged node was not seen in this location on the 2016 PET-CT. Slightly more inferiorly is also an additional rounded node measuring 5 mm diameter. No contralateral or more cranial adenopathy. Vascular: Mild atherosclerotic calcification. Limited intracranial: Negative Visualized orbits: Negative Mastoids and  visualized paranasal sinuses: Negative Skeleton: No acute or aggressive finding Upper chest: Reported separately IMPRESSION: 1. The palpable concern reflects a rounded 1 cm left supraclavicular node. Although no specific malignant features and only mild enlargement, this is reportedly hard on exam and is new from PET-CT in 2016. Biopsy or short-term follow-up recommended. 2. Chest CT reported separately. Electronically Signed   By: Monte Fantasia M.D.   On: 08/25/2017 14:32   Ct Chest W Contrast  Result Date: 08/25/2017 CLINICAL DATA:  Non-small cell lung cancer initial diagnosis 2016. Chemotherapy radiation therapy. EXAM: CT CHEST, ABDOMEN, AND PELVIS WITH CONTRAST TECHNIQUE: Multidetector CT imaging of the chest, abdomen and pelvis was performed following the standard protocol during bolus administration of intravenous contrast. CONTRAST:  145mL ISOVUE-300 IOPAMIDOL (ISOVUE-300) INJECTION 61%, 61mL ISOVUE-300 IOPAMIDOL (ISOVUE-300) INJECTION 61% COMPARISON:  None. FINDINGS: CT CHEST FINDINGS Cardiovascular: Port in the LEFT chest wall with tip in the distal SVC. No  significant vascular findings. Normal heart size. No pericardial effusion. Mediastinum/Nodes: Indication no axillary adenopathy. Supraclavicular adenopathy. No mediastinal adenopathy. Hilar adenopathy. Lungs/Pleura: Perihilar consolidation posterior to the RIGHT hilum not changed comparison exam. No new pulmonary nodularity. Postsurgical change in the RIGHT hemithorax. Musculoskeletal: No aggressive osseous lesion. CT ABDOMEN AND PELVIS FINDINGS Hepatobiliary: No focal hepatic lesion. No biliary ductal dilatation. Gallbladder is normal. Common bile duct is normal. Pancreas: Pancreas is normal. No ductal dilatation. No pancreatic inflammation. Spleen: Normal spleen Adrenals/urinary tract: Adrenal glands and kidneys are normal. The ureters and bladder normal. Stomach/Bowel: Stomach, small bowel, appendix, and cecum are normal. The colon and rectosigmoid colon are normal. Vascular/Lymphatic: Abdominal aorta is normal caliber. There is no retroperitoneal or periportal lymphadenopathy. No pelvic lymphadenopathy. Reproductive: Post hysterectomy scratch normal uterus no adnexal abnormality Other: No peritoneal nodularity. Musculoskeletal: No aggressive osseous lesion. IMPRESSION: Chest Impression: . 1. No evidence of lung cancer recurrence in thorax. 2. Stable post therapy change in the LEFT lung hilum. 3. See neck CT for description of LEFT supraclavicular lymph node. Abdomen / Pelvis Impression: No metastatic disease in the abdomen or pelvis. Electronically Signed   By: Suzy Bouchard M.D.   On: 08/25/2017 16:06   Ct Abdomen Pelvis W Contrast  Result Date: 08/25/2017 CLINICAL DATA:  Non-small cell lung cancer initial diagnosis 2016. Chemotherapy radiation therapy. EXAM: CT CHEST, ABDOMEN, AND PELVIS WITH CONTRAST TECHNIQUE: Multidetector CT imaging of the chest, abdomen and pelvis was performed following the standard protocol during bolus administration of intravenous contrast. CONTRAST:  131mL ISOVUE-300 IOPAMIDOL  (ISOVUE-300) INJECTION 61%, 23mL ISOVUE-300 IOPAMIDOL (ISOVUE-300) INJECTION 61% COMPARISON:  None. FINDINGS: CT CHEST FINDINGS Cardiovascular: Port in the LEFT chest wall with tip in the distal SVC. No significant vascular findings. Normal heart size. No pericardial effusion. Mediastinum/Nodes: Indication no axillary adenopathy. Supraclavicular adenopathy. No mediastinal adenopathy. Hilar adenopathy. Lungs/Pleura: Perihilar consolidation posterior to the RIGHT hilum not changed comparison exam. No new pulmonary nodularity. Postsurgical change in the RIGHT hemithorax. Musculoskeletal: No aggressive osseous lesion. CT ABDOMEN AND PELVIS FINDINGS Hepatobiliary: No focal hepatic lesion. No biliary ductal dilatation. Gallbladder is normal. Common bile duct is normal. Pancreas: Pancreas is normal. No ductal dilatation. No pancreatic inflammation. Spleen: Normal spleen Adrenals/urinary tract: Adrenal glands and kidneys are normal. The ureters and bladder normal. Stomach/Bowel: Stomach, small bowel, appendix, and cecum are normal. The colon and rectosigmoid colon are normal. Vascular/Lymphatic: Abdominal aorta is normal caliber. There is no retroperitoneal or periportal lymphadenopathy. No pelvic lymphadenopathy. Reproductive: Post hysterectomy scratch normal uterus no adnexal abnormality Other: No peritoneal nodularity. Musculoskeletal:  No aggressive osseous lesion. IMPRESSION: Chest Impression: . 1. No evidence of lung cancer recurrence in thorax. 2. Stable post therapy change in the LEFT lung hilum. 3. See neck CT for description of LEFT supraclavicular lymph node. Abdomen / Pelvis Impression: No metastatic disease in the abdomen or pelvis. Electronically Signed   By: Suzy Bouchard M.D.   On: 08/25/2017 16:06    Labs:  CBC: Recent Labs    09/30/16 0941 03/20/17 1037 08/14/17 1005 08/29/17 0938  WBC 3.0* 3.7* 4.9 3.0*  HGB 12.9 13.3  --  13.5  HCT 38.5 40.1 40.3 40.3  PLT 250 238 253 227     COAGS: No results for input(s): INR, APTT in the last 8760 hours.  BMP: Recent Labs    09/30/16 0941 03/20/17 1038 08/14/17 1005 08/29/17 0938  NA 140 139 141 140  K 4.1 4.3 4.4 4.2  CL  --   --  107 105  CO2 26 27 26 28   GLUCOSE 77 88 93 81  BUN 16.2 15.4 16 14   CALCIUM 9.3 9.8 9.6 9.2  CREATININE 0.8 0.7 0.77 0.81  GFRNONAA  --   --  >60 >60  GFRAA  --   --  >60 >60    LIVER FUNCTION TESTS: Recent Labs    09/30/16 0941 03/20/17 1038 08/14/17 1005 08/29/17 0938  BILITOT 0.43 0.42 0.4 0.4  AST 22 25 21 23   ALT 19 22 20 16   ALKPHOS 37* 37* 39* 34*  PROT 6.6 6.6 6.6 6.5  ALBUMIN 3.9 3.8 3.9 4.0    TUMOR MARKERS: No results for input(s): AFPTM, CEA, CA199, CHROMGRNA in the last 8760 hours.  Assessment and Plan:  Hx NSCLC New L supraclavicular lymphadenopathy Scheduled for biopsy Risks and benefits discussed with the patient including, but not limited to bleeding, infection, damage to adjacent structures or low yield requiring additional tests.  All of the patient's questions were answered, patient is agreeable to proceed. Consent signed and in chart.   Thank you for this interesting consult.  I greatly enjoyed meeting Kortnee Bas and look forward to participating in their care.  A copy of this report was sent to the requesting provider on this date.  Electronically Signed: Lavonia Drafts, PA-C 09/05/2017, 12:05 PM   I spent a total of  30 Minutes   in face to face in clinical consultation, greater than 50% of which was counseling/coordinating care for L SCLN bx

## 2017-09-05 NOTE — Procedures (Signed)
US guided left supraclavicular lymph node biopsy.  5 cores obtained.  Minimal blood loss and no immediate complication.

## 2017-09-05 NOTE — Sedation Documentation (Signed)
Patient is resting comfortably. 

## 2017-09-12 ENCOUNTER — Inpatient Hospital Stay (HOSPITAL_BASED_OUTPATIENT_CLINIC_OR_DEPARTMENT_OTHER): Payer: Managed Care, Other (non HMO) | Admitting: Oncology

## 2017-09-12 ENCOUNTER — Encounter: Payer: Self-pay | Admitting: Oncology

## 2017-09-12 VITALS — BP 139/100 | HR 88 | Temp 98.4°F | Resp 18 | Ht 63.0 in | Wt 143.3 lb

## 2017-09-12 DIAGNOSIS — C3411 Malignant neoplasm of upper lobe, right bronchus or lung: Secondary | ICD-10-CM | POA: Diagnosis not present

## 2017-09-12 DIAGNOSIS — C770 Secondary and unspecified malignant neoplasm of lymph nodes of head, face and neck: Secondary | ICD-10-CM | POA: Diagnosis not present

## 2017-09-12 DIAGNOSIS — C3490 Malignant neoplasm of unspecified part of unspecified bronchus or lung: Secondary | ICD-10-CM | POA: Diagnosis not present

## 2017-09-12 DIAGNOSIS — M545 Low back pain: Secondary | ICD-10-CM | POA: Diagnosis not present

## 2017-09-12 DIAGNOSIS — R221 Localized swelling, mass and lump, neck: Secondary | ICD-10-CM | POA: Diagnosis not present

## 2017-09-12 DIAGNOSIS — C259 Malignant neoplasm of pancreas, unspecified: Secondary | ICD-10-CM | POA: Diagnosis not present

## 2017-09-12 NOTE — Assessment & Plan Note (Signed)
This is a very pleasant 62 year old white female with a stage IIIB non-small cell lung cancer, adenocarcinoma with negative EGFR, ALK mutations diagnosed in October 2016. She is status post course of concurrent chemoradiation followed by consolidation chemotherapy with carboplatin and Alimta. The patient has been in observation for close to 2 years. Recent imaging studies including CT scan of the neck showed enlarged left supraclavicular lymph node suspicious for disease recurrence. She underwent an ultrasound-guided core biopsy of the enlarged left supraclavicular lymph node and is here to discuss the results.  The patient was seen with Dr. Julien Nordmann.  Biopsy results were discussed with the patient and her husband which confirmed recurrent disease.  Prior CT scans did not show evidence of disease in her chest or abdomen.  We discussed a referral to radiation oncology for consideration of radiation to the left supraclavicular region.  We will also send her recent biopsy for PDL 1 and molecular testing. We also discussed proceeding with a PET scan to evaluate for metastatic disease and an MRI of the brain to evaluate for brain metastases.  The patient reports that she does not have health insurance at this time and wishes to hold off on ordering the PET scan and the MRI of the brain.  She was given information for financial counselor to discuss her options.  She will let us know when she wants [place] to order these tests.  The patient will have a follow-up visit in approximately 6 weeks to discuss further treatment options.  She was advised to call immediately if she has any concerning symptoms in the interval. The patient voices understanding of current disease status and treatment options and is in agreement with the current care plan. All questions were answered. The patient knows to call the clinic with any problems, questions or concerns. We can certainly see the patient much sooner if necessary.

## 2017-09-12 NOTE — Progress Notes (Signed)
[facility] OFFICE PROGRESS NOTE  Rose Bayley, DO [place] [address]  DIAGNOSIS: Stage IIIB (T2a, N3, M0) non-small cell lung cancer, adenocarcinoma with negative EGFR mutation and negative ALK gene translocation diagnosed in October 2016.  PRIOR THERAPY: 1) Concurrent chemoradiation with weekly carboplatin for AUC of 2 and paclitaxel 45 MG/M2 status post 6 cycles. Last dose was given 07/13/2015 with partial response. 2) Systemic chemotherapy with carboplatin for AUC of 5 and Alimta 500 MG/M2 every 3 weeks. First dose 11/02/2015. Status post 6 cycles.  CURRENT THERAPY: Observation.  INTERVAL HISTORY: Rose Figueroa 62 y.o. female returns for routine follow-up visit accompanied by her husband.  The patient is feeling fine today and has no specific complaints.  She denies fevers and chills.  Denies chest pain, shortness of breath, cough, hemoptysis.  Denies nausea, vomiting, cons patient, diarrhea.  She denies having any recent weight loss or night sweats.  The patient still notices the swelling in the left neck area.  She thinks this may have enlarged slightly.  She had a recent biopsy of the left supraclavicular node and is here to discuss the results.  MEDICAL HISTORY: Past Medical History:  Diagnosis Date  . Anxiety   . Back pain    right back lateral  . BCC (basal cell carcinoma of skin)   . Colon polyps   . Complication of anesthesia    pt. states that she is difficult to arouse  . Complication of anesthesia    "indigestion and burping " after eating.  . Diverticulosis   . Dysrhythmia    "skip beat in early 20's"  . H/O cold sores   . History of anemia   . Lung cancer ([place])    RUL mass -being tx- radiation and chemo- remains last chemo 01-04-16  . Osteoporosis   . PONV (postoperative nausea and vomiting)   . Rectal bleeding   . right upper lobe lung mass 04/15/2015   malignant tumor found - 10'16-radiation and chemotherapy and remains with  chemo at present- Dr. Earlie Server follows.    ALLERGIES:  has No Known Allergies.  MEDICATIONS:  Current Outpatient Medications  Medication Sig Dispense Refill  . Biotin 5000 MCG CAPS Take 5,000 mcg by mouth daily.    . Calcium Carb-Cholecalciferol (CALCIUM 1000 + D PO) Take 2 tablets by mouth 2 (two) times daily. D3 = " 25 mcg"    . cetirizine (ZYRTEC) 10 MG tablet Take 10 mg by mouth daily.     Marland Kitchen L-LYSINE PO Take 3,000 mg by mouth daily.     Marland Kitchen lidocaine-prilocaine (EMLA) cream Apply 1 application topically as needed. 30 g 0  . Misc Natural Products (TART CHERRY ADVANCED PO) Take 1,000 mg by mouth daily.     . Multiple Vitamins-Minerals (CENTRUM SILVER PO) Take 1 tablet by mouth daily.    . Omega-3 Fatty Acids (RA FISH OIL) 1400 MG CPDR Take 1 capsule by mouth 2 (two) times daily.     Marland Kitchen OVER THE COUNTER MEDICATION Take 1,000 mg by mouth daily. "spirolina'    . psyllium (METAMUCIL) 58.6 % powder Take 1 packet by mouth 3 (three) times daily.    . Turmeric Curcumin 500 MG CAPS Take 1 each by mouth daily.     . vitamin B-12 (CYANOCOBALAMIN) 1000 MCG tablet Take 1,000 mcg by mouth daily.     No current facility-administered medications for this visit.     SURGICAL HISTORY:  Past Surgical  History:  Procedure Laterality Date  . COLONOSCOPY    . COLONOSCOPY W/ POLYPECTOMY    . COLONOSCOPY WITH PROPOFOL N/A 01/20/2016   Procedure: COLONOSCOPY WITH PROPOFOL;  Surgeon: Arta Silence, MD;  Location: [facility];  Service: Endoscopy;  Laterality: N/A;  . CYSTECTOMY     right breast x 2  . MEDIASTINOSCOPY N/A 05/07/2015   Procedure: MEDIASTINOSCOPY;  Surgeon: Ivin Poot, MD;  Location: [facility];  Service: Thoracic;  Laterality: N/A;  . TONSILLECTOMY    . VIDEO BRONCHOSCOPY WITH ENDOBRONCHIAL ULTRASOUND N/A 05/07/2015   Procedure: VIDEO BRONCHOSCOPY WITH ENDOBRONCHIAL ULTRASOUND;  Surgeon: Ivin Poot, MD;  Location: [facility] OR;  Service: Thoracic;  Laterality: N/A;    REVIEW OF SYSTEMS:    Review of Systems  Constitutional: Negative for appetite change, chills, fatigue, fever and unexpected weight change.  HENT:   Negative for mouth sores, nosebleeds, sore throat and trouble swallowing.  Notices swelling to the left supraclavicular node. Eyes: Negative for eye problems and icterus.  Respiratory: Negative for cough, hemoptysis, shortness of breath and wheezing.   Cardiovascular: Negative for chest pain and leg swelling.  Gastrointestinal: Negative for abdominal pain, constipation, diarrhea, nausea and vomiting.  Genitourinary: Negative for bladder incontinence, difficulty urinating, dysuria, frequency and hematuria.   Musculoskeletal: Negative for back pain, gait problem, neck pain and neck stiffness.  Skin: Negative for itching and rash.  Neurological: Negative for dizziness, extremity weakness, gait problem, headaches, light-headedness and seizures.  Hematological: Negative for adenopathy. Does not bruise/bleed easily.  Psychiatric/Behavioral: Negative for confusion, depression and sleep disturbance. The patient is not nervous/anxious.     PHYSICAL EXAMINATION:  Blood pressure (!) 139/100, pulse 88, temperature 98.4 F (36.9 C), temperature source Oral, resp. rate 18, height 5' 3"  (1.6 m), weight 143 lb 4.8 oz (65 kg), SpO2 100 %.  ECOG PERFORMANCE STATUS: 1 - Symptomatic but completely ambulatory  Physical Exam  Constitutional: Oriented to person, place, and time and well-developed, well-nourished, and in no distress. No distress.  HENT:  Head: Normocephalic and atraumatic.  Mouth/Throat: Oropharynx is clear and moist. No oropharyngeal exudate.  Eyes: Conjunctivae are normal. Right eye exhibits no discharge. Left eye exhibits no discharge. No scleral icterus.  Neck: Normal range of motion. Neck supple. Fullness to the left supraclavicular area. Cardiovascular: Normal rate, regular rhythm, normal heart sounds and intact distal pulses.   Pulmonary/Chest: Effort normal  and breath sounds normal. No respiratory distress. No wheezes. No rales.  Abdominal: Soft. Bowel sounds are normal. Exhibits no distension and no mass. There is no tenderness.  Musculoskeletal: Normal range of motion. Exhibits no edema.  Lymphadenopathy:    No cervical adenopathy.  Neurological: Alert and oriented to person, place, and time. Exhibits normal muscle tone. Gait normal. Coordination normal.  Skin: Skin is warm and dry. No rash noted. Not diaphoretic. No erythema. No pallor.  Psychiatric: Mood, memory and judgment normal.  Vitals reviewed.  LABORATORY DATA: Lab Results  Component Value Date   WBC 3.9 (L) 09/05/2017   HGB 13.8 09/05/2017   HCT 41.2 09/05/2017   MCV 94.3 09/05/2017   PLT 239 09/05/2017      Chemistry      Component Value Date/Time   NA 140 08/29/2017 0938   NA 139 03/20/2017 1038   K 4.2 08/29/2017 0938   K 4.3 03/20/2017 1038   CL 105 08/29/2017 0938   CO2 28 08/29/2017 0938   CO2 27 03/20/2017 1038   BUN 14 08/29/2017 0938   BUN 15.4  03/20/2017 1038   CREATININE 0.81 08/29/2017 0938   CREATININE 0.77 08/14/2017 1005   CREATININE 0.7 03/20/2017 1038      Component Value Date/Time   CALCIUM 9.2 08/29/2017 0938   CALCIUM 9.8 03/20/2017 1038   ALKPHOS 34 (L) 08/29/2017 0938   ALKPHOS 37 (L) 03/20/2017 1038   AST 23 08/29/2017 0938   AST 21 08/14/2017 1005   AST 25 03/20/2017 1038   ALT 16 08/29/2017 0938   ALT 20 08/14/2017 1005   ALT 22 03/20/2017 1038   BILITOT 0.4 08/29/2017 0938   BILITOT 0.4 08/14/2017 1005   BILITOT 0.42 03/20/2017 1038       RADIOGRAPHIC STUDIES:  Ct Soft Tissue Neck W Contrast  Result Date: 08/25/2017 CLINICAL DATA:  Localized swelling. Restaging lung cancer. New left supraclavicular lymph node. EXAM: CT NECK WITH CONTRAST TECHNIQUE: Multidetector CT imaging of the neck was performed using the standard protocol following the bolus administration of intravenous contrast. CONTRAST:  132m ISOVUE-300 IV  COMPARISON:  Chest CT 03/20/2017 FINDINGS: Pharynx and larynx: Normal. No mass or swelling. Salivary glands: No inflammation, mass, or stone. Thyroid: Normal. Lymph nodes: There is a left supraclavicular lymph node with mild rounding that measures up to 10 x 9 mm. This node was likely not seen on multiple prior chest CTs. Enlarged node was not seen in this location on the 2016 PET-CT. Slightly more inferiorly is also an additional rounded node measuring 5 mm diameter. No contralateral or more cranial adenopathy. Vascular: Mild atherosclerotic calcification. Limited intracranial: Negative Visualized orbits: Negative Mastoids and visualized paranasal sinuses: Negative Skeleton: No acute or aggressive finding Upper chest: Reported separately IMPRESSION: 1. The palpable concern reflects a rounded 1 cm left supraclavicular node. Although no specific malignant features and only mild enlargement, this is reportedly hard on exam and is new from PET-CT in 2016. Biopsy or short-term follow-up recommended. 2. Chest CT reported separately. Electronically Signed   By: JMonte FantasiaM.D.   On: 08/25/2017 14:32   Ct Chest W Contrast  Result Date: 08/25/2017 CLINICAL DATA:  Non-small cell lung cancer initial diagnosis 2016. Chemotherapy radiation therapy. EXAM: CT CHEST, ABDOMEN, AND PELVIS WITH CONTRAST TECHNIQUE: Multidetector CT imaging of the chest, abdomen and pelvis was performed following the standard protocol during bolus administration of intravenous contrast. CONTRAST:  104mISOVUE-300 IOPAMIDOL (ISOVUE-300) INJECTION 61%, 3057mSOVUE-300 IOPAMIDOL (ISOVUE-300) INJECTION 61% COMPARISON:  None. FINDINGS: CT CHEST FINDINGS Cardiovascular: Port in the LEFT chest wall with tip in the distal SVC. No significant vascular findings. Normal heart size. No pericardial effusion. Mediastinum/Nodes: Indication no axillary adenopathy. Supraclavicular adenopathy. No mediastinal adenopathy. Hilar adenopathy. Lungs/Pleura: Perihilar  consolidation posterior to the RIGHT hilum not changed comparison exam. No new pulmonary nodularity. Postsurgical change in the RIGHT hemithorax. Musculoskeletal: No aggressive osseous lesion. CT ABDOMEN AND PELVIS FINDINGS Hepatobiliary: No focal hepatic lesion. No biliary ductal dilatation. Gallbladder is normal. Common bile duct is normal. Pancreas: Pancreas is normal. No ductal dilatation. No pancreatic inflammation. Spleen: Normal spleen Adrenals/urinary tract: Adrenal glands and kidneys are normal. The ureters and bladder normal. Stomach/Bowel: Stomach, small bowel, appendix, and cecum are normal. The colon and rectosigmoid colon are normal. Vascular/Lymphatic: Abdominal aorta is normal caliber. There is no retroperitoneal or periportal lymphadenopathy. No pelvic lymphadenopathy. Reproductive: Post hysterectomy scratch normal uterus no adnexal abnormality Other: No peritoneal nodularity. Musculoskeletal: No aggressive osseous lesion. IMPRESSION: Chest Impression: . 1. No evidence of lung cancer recurrence in thorax. 2. Stable post therapy change in the LEFT lung hilum. 3.  See neck CT for description of LEFT supraclavicular lymph node. Abdomen / Pelvis Impression: No metastatic disease in the abdomen or pelvis. Electronically Signed   By: Suzy Bouchard M.D.   On: 08/25/2017 16:06   Ct Abdomen Pelvis W Contrast  Result Date: 08/25/2017 CLINICAL DATA:  Non-small cell lung cancer initial diagnosis 2016. Chemotherapy radiation therapy. EXAM: CT CHEST, ABDOMEN, AND PELVIS WITH CONTRAST TECHNIQUE: Multidetector CT imaging of the chest, abdomen and pelvis was performed following the standard protocol during bolus administration of intravenous contrast. CONTRAST:  149m ISOVUE-300 IOPAMIDOL (ISOVUE-300) INJECTION 61%, 359mISOVUE-300 IOPAMIDOL (ISOVUE-300) INJECTION 61% COMPARISON:  None. FINDINGS: CT CHEST FINDINGS Cardiovascular: Port in the LEFT chest wall with tip in the distal SVC. No significant vascular  findings. Normal heart size. No pericardial effusion. Mediastinum/Nodes: Indication no axillary adenopathy. Supraclavicular adenopathy. No mediastinal adenopathy. Hilar adenopathy. Lungs/Pleura: Perihilar consolidation posterior to the RIGHT hilum not changed comparison exam. No new pulmonary nodularity. Postsurgical change in the RIGHT hemithorax. Musculoskeletal: No aggressive osseous lesion. CT ABDOMEN AND PELVIS FINDINGS Hepatobiliary: No focal hepatic lesion. No biliary ductal dilatation. Gallbladder is normal. Common bile duct is normal. Pancreas: Pancreas is normal. No ductal dilatation. No pancreatic inflammation. Spleen: Normal spleen Adrenals/urinary tract: Adrenal glands and kidneys are normal. The ureters and bladder normal. Stomach/Bowel: Stomach, small bowel, appendix, and cecum are normal. The colon and rectosigmoid colon are normal. Vascular/Lymphatic: Abdominal aorta is normal caliber. There is no retroperitoneal or periportal lymphadenopathy. No pelvic lymphadenopathy. Reproductive: Post hysterectomy scratch normal uterus no adnexal abnormality Other: No peritoneal nodularity. Musculoskeletal: No aggressive osseous lesion. IMPRESSION: Chest Impression: . 1. No evidence of lung cancer recurrence in thorax. 2. Stable post therapy change in the LEFT lung hilum. 3. See neck CT for description of LEFT supraclavicular lymph node. Abdomen / Pelvis Impression: No metastatic disease in the abdomen or pelvis. Electronically Signed   By: StSuzy Bouchard.D.   On: 08/25/2017 16:06   [place] Biopsy (lymph Nodes)  Result Date: 09/05/2017 INDICATION: [age]ear old with history of non-small cell lung cancer. Patient has a new palpable nodule in the left supraclavicular region. Tissue diagnosis is needed. EXAM: ULTRASOUND-GUIDED CORE BIOPSIES OF LEFT SUPRACLAVICULAR LYMPH NODE MEDICATIONS: None. ANESTHESIA/SEDATION: None FLUOROSCOPY TIME:  None COMPLICATIONS: None immediate. PROCEDURE: Informed written consent  was obtained from the patient after a thorough discussion of the procedural risks, benefits and alternatives. All questions were addressed. A timeout was performed prior to the initiation of the procedure. Left supraclavicular area was evaluated with ultrasound. The palpable nodule was identified. Left side of the neck was prepped with chlorhexidine and sterile field was created. Skin and soft tissues were anesthetized with 1% lidocaine. Using ultrasound guidance, a total of 5 core biopsies were obtained with an 18 gauge device. Specimens placed in saline. Bandage placed over the puncture site. FINDINGS: Round nodule, presumed to be a lymph node. Small echogenic foci within this nodule. No significant bleeding or hematoma formation following the core biopsies. IMPRESSION: Ultrasound-guided core biopsies of left supraclavicular lymph node/nodule. Electronically Signed   By: AdMarkus Daft.D.   On: 09/05/2017 16:17   PATHOLOGY:  Diagnosis Lymph node, needle/core biopsy, left supraclavicular - METASTATIC ADENOCARCINOMA CONSISTENT WITH LUNG PRIMARY. Microscopic Comment The metastatic carcinoma is positive with thyroid transcription factor-1 (TTF-1) and negative with cytokeratin 5/6. (JDP:ah 09/07/17)  ASSESSMENT/PLAN:  [facility] [age][facility] white female with a stage IIIB non-small cell lung cancer, adenocarcinoma with  negative EGFR, ALK mutations diagnosed in October 2016. She is status post course of concurrent chemoradiation followed by consolidation chemotherapy with carboplatin and Alimta. The patient has been in observation for close to 2 years. Recent imaging studies including CT scan of the neck showed enlarged left supraclavicular lymph node suspicious for disease recurrence. She underwent an ultrasound-guided core biopsy of the enlarged left supraclavicular lymph node and is here to discuss the results.  The patient was seen with Dr.  Julien Nordmann.  Biopsy results were discussed with the patient and her husband which confirmed recurrent disease.  Prior CT scans did not show evidence of disease in her chest or abdomen.  We discussed a referral to radiation oncology for consideration of radiation to the left supraclavicular region.  We will also send her recent biopsy for PDL 1 and molecular testing. We also discussed proceeding with a PET scan to evaluate for metastatic disease and an MRI of the brain to evaluate for brain metastases.  The patient reports that she does not have health insurance at this time and wishes to hold off on ordering the PET scan and the MRI of the brain.  She was given information for financial counselor to discuss her options.  She will let us know when she wants [place] to order these tests.  The patient will have a follow-up visit in approximately 6 weeks to discuss further treatment options.  She was advised to call immediately if she has any concerning symptoms in the interval. The patient voices understanding of current disease status and treatment options and is in agreement with the current care plan. All questions were answered. The patient knows to call the clinic with any problems, questions or concerns. We can certainly see the patient much sooner if necessary.  Orders Placed This Encounter  Procedures  . CBC with Differential (Cancer Center Only)    Standing Status:   Future    Standing Expiration Date:   09/12/2018  . CMP ([facility] only)    Standing Status:   Future    Standing Expiration Date:   09/12/2018  . Ambulatory referral to Radiation Oncology    Referral Priority:   Routine    Referral Type:   Consultation    Referral Reason:   Specialty Services Required    Referred to Provider:   Tyler Pita, MD    Requested Specialty:   Radiation Oncology    Number of Visits Requested:   Index, DNP, AGPCNP-BC, AOCNP 09/12/17  ADDENDUM: Hematology/Oncology Attending: I  had a face-to-face encounter with the patient.  I recommended her care plan.  This is a very pleasant 62 years old white female with history of a stage IIIb non-small cell lung cancer, adenocarcinoma status post a course of concurrent chemoradiation followed by consolidation chemotherapy and has been in observation for the last 2 years.  She was found on recent CT scan of the chest to have left supraclavicular lymph node.  The patient underwent ultrasound-guided core biopsy of the lymph node and the final pathology was consistent with metastatic adenocarcinoma. I had a lengthy discussion with the patient and her husband about her condition and treatment options. I recommended for the patient to see Dr. Tammi Klippel for consideration of radiotherapy to the solitary nodules.  I also discussed with the patient consideration of a PET scan but she would like to hold on this option because of lack of insurance at this point. I would also send the tissue block  to [facility] for PDL 1 expression as well as molecular studies. I will arrange for the patient to come back for follow-up visit in 6 weeks for reevaluation and discussion of her molecular studies and treatment options if needed at that time. She was advised to call immediately if she has any concerning symptoms in the interval.  Disclaimer: This note was dictated with voice recognition software. Similar sounding words can inadvertently be transcribed and may be missed upon review. Eilleen Kempf, MD 09/13/17

## 2017-09-13 ENCOUNTER — Telehealth: Payer: Self-pay | Admitting: *Deleted

## 2017-09-13 ENCOUNTER — Telehealth: Payer: Self-pay | Admitting: Oncology

## 2017-09-13 ENCOUNTER — Encounter: Payer: Self-pay | Admitting: *Deleted

## 2017-09-13 NOTE — Telephone Encounter (Signed)
Called to schedule appts per 2/19 los - per patient , did not want to schedule anything yet until insurance was figured out. Patient will call back when ready

## 2017-09-13 NOTE — Telephone Encounter (Signed)
I called Rose Figueroa to check on her. She recently had tissue DX and her lung cancer had come back.  I spoke with patient.  She was tearful. I listened to her and she explained her feelings.  She has advance cancer and her husband has lost his job.  I spoke with her about supportive services to help navigate this process for her.  I will set up MRI brain, PET and follow up with Dr. Julien Nordmann.  She is still processing the information about her cancer.  I also followed up with Cindy FA in [place]. She states patient has gone to [facility] office for help regarding the need for more insurance.

## 2017-09-13 NOTE — Progress Notes (Signed)
Oncology Nurse Navigator Documentation  Oncology Nurse Navigator Flowsheets 09/13/2017  Navigator Location [facility]  Navigator Encounter Type Other/I requested tissue to be sent for [facility] and PDL 1 per Dr. Julien Nordmann.  Tissue obtained on 09/05/17.    Treatment Phase Follow-up  Barriers/Navigation Needs Coordination of Care  Interventions Coordination of Care  Acuity Level 1  Time Spent with Patient 15

## 2017-09-14 ENCOUNTER — Encounter: Payer: Self-pay | Admitting: *Deleted

## 2017-09-14 ENCOUNTER — Telehealth: Payer: Self-pay | Admitting: *Deleted

## 2017-09-14 DIAGNOSIS — H0011 Chalazion right upper eyelid: Secondary | ICD-10-CM | POA: Diagnosis not present

## 2017-09-14 DIAGNOSIS — C3411 Malignant neoplasm of upper lobe, right bronchus or lung: Secondary | ICD-10-CM

## 2017-09-14 NOTE — Telephone Encounter (Signed)
Oncology Nurse Navigator Documentation  Oncology Nurse Navigator Flowsheets 09/14/2017  Navigator Location [facility]  Navigator Encounter Type Telephone;Lobby/patient was at [facility] for a class.  She asked to see me. I updated her on appts for scans, follow up with Dr. Julien Nordmann, and pre-procedure instructions. She verbalized understanding of appt time and place.   Telephone Outgoing Call  Treatment Phase Follow-up  Barriers/Navigation Needs Coordination of Care;Education  Education Other  Interventions Coordination of Care;Education  Coordination of Care Appts  Education Method Verbal;Written  Acuity Level 2  Time Spent with Patient 30

## 2017-09-14 NOTE — Telephone Encounter (Signed)
Oncology Nurse Navigator Documentation  Oncology Nurse Navigator Flowsheets 09/14/2017  Navigator Location [facility]  Navigator Encounter Type Telephone/I called central scheduling to get appts for patient's PET and MRI Rose Figueroa. I was given appt time and dates.  I called Rose Figueroa to update her on her schedule. I was unable to reach her but did leave a vm message for her to call me.   Telephone Outgoing Call  Treatment Phase Follow-up  Barriers/Navigation Needs Coordination of Care;Education  Education Other  Interventions Coordination of Care;Education  Coordination of Care Appts  Acuity Level 2  Acuity Level 2 Assistance expediting appointments  Time Spent with Patient 30

## 2017-09-19 ENCOUNTER — Telehealth: Payer: Self-pay | Admitting: *Deleted

## 2017-09-19 ENCOUNTER — Encounter: Payer: Self-pay | Admitting: Radiation Oncology

## 2017-09-19 NOTE — Telephone Encounter (Signed)
Oncology Nurse Navigator Documentation  Oncology Nurse Navigator Flowsheets 09/19/2017  Navigator Location [facility]  Navigator Encounter Type Telephone/I called Rose Figueroa today. I spoke with her. I asked her about her insurance. She states she does not have [employer], just Medicare at this time.  I stated her scans have been denied and cancelled.  She was disappoint to hear they were cancelled due to authorization was done through [place].  I will updated Radiology team of the correction. I told patient she will get a call with scans. She was thankful for the help.   Telephone Outgoing Call  Treatment Phase Follow-up  Barriers/Navigation Needs Coordination of Care;Education  Education Other  Interventions Coordination of Care;Education  Coordination of Care Other  Education Method Verbal  Acuity Level 2  Time Spent with Patient 30

## 2017-09-20 ENCOUNTER — Encounter (HOSPITAL_COMMUNITY): Payer: Self-pay

## 2017-09-20 ENCOUNTER — Ambulatory Visit (HOSPITAL_COMMUNITY): Admission: RE | Admit: 2017-09-20 | Payer: Medicare Other | Source: Ambulatory Visit

## 2017-09-20 ENCOUNTER — Other Ambulatory Visit (HOSPITAL_COMMUNITY): Payer: Medicare Other

## 2017-09-21 NOTE — Progress Notes (Addendum)
Thoracic Location of Tumor / Histology: Cancer of upper lobe of right lung, enlarged left supraclavicular lymph node,Reconsult  Patient presented months ago with symptoms of: She still notices the swelling in the left neck area.  She thinks this may have enlarged slightly.  Back pain left side and drippy nose.  Biopsies of  (if applicable) revealed:          Diagnosis  09-05-17      Lymph node, needle/core biopsy, left supraclavicular     - METASTATIC ADENOCARCINOMA CONSISTENT WITH LUNG PRIMARY.        Tobacco/Marijuana/Snuff/ETOH use: Former smoker 1 PD 15 years no drug or alcohol usage  Past/Anticipated interventions by cardiothoracic surgery, if any:   Diagnosis 1013-16     1. Lymph node, biopsy, pretracheal     - THERE IS NO EVIDENCE OF CARCINOMA IN 1 OF 1 LYMPH NODE (0/1).     2. Lymph node, biopsy, 4R     - BENIGN FIBROADIPOSE TISSUE.     - LYMPH NODAL TISSUE IS NOT IDENTIFIED.     - THERE IS NO EVIDENCE OF MALIGNANCY.  Diagnosis  05-07-15 WANG NEEDLE, FINE NEEDLE ASPIRATION (D) LYMPH NODE 7 #2, (SPECIMEN 4 OF 4, COLLECTED ON 05/07/15) MALIGNANT CELLS PRESENT CONSISTENT WITH NON SMALL CELL CARCINOMA. SEE COMMENT. COMMENT: THE FEATURES FAVOR ADENOCARCINOMA. THERE IS INSUFFICIENT TUMOR PRESENT FOR FURTHER STUDIES.  Diagnosis 05-07-15 FINE NEEDLE ASPIRATION: ENDOSCOPIC EBUS (C) LYMPH NODE 7, (SPECIMEN 3 OF 4, COLLECTED ON 05/07/15): MALIGNANT CELLS PRESENT, CONSISTENT WITH NON SMALL CELL CARCINOMA. SEE COMMENT. COMMENT: THE FEATURES FAVOR ADENOCARCINOMA. THERE IS INSUFFICIENT TUMOR PRESENT FOR FURTHER STUDIES.   Diagnosis 05-07-15 FINE NEEDLE ASPIRATION: ENDOSCOPIC EBUS (B) RIGHT UPPER LOBE BRONCHIAL BRUSHINGS, (SPECIMEN 2 OF 4, COLLECTED ON 05/07/15): BENIGN REACTIVE/REPARATIVE CHANGES.  Diagnosis 05-07-15 BRONCHIAL WASHING A, RIGHT , (SPECIMEN 1 OF 4 COLLECTED 05/07/2015) BENIGN REACTIVE/REPARATIVE CHANGES.    Past/Anticipated interventions by medical oncology,  if any:  09-22-17 MRI Brain 09-22-17 PET  09-12-17 1) Concurrent chemoradiation with weekly carboplatin for AUC of 2 and paclitaxel 45 MG/M2 status post 6 cycles. Last dose was given 07/13/2015 with partial response. 2) Systemic chemotherapy with carboplatin for AUC of 5 and Alimta 500 MG/M2 every 3 weeks. First dose 11/02/2015. Status post 6 cycles.   Signs/Symptoms  Weight changes, if any: No  Respiratory complaints, if any: SOB with exertion or cold weather, non productive coughing, wheezing in the morning cough clears the wheezing  Hemoptysis, if any:No   Pain issues, if any: No   SAFETY ISSUES:  Prior radiation? Yes 06-01-15-07-16-15 Right lung involving mediastinal adenopathy   Pacemaker/ICD? : No  Possible current pregnancy? : No  Is the patient on methotrexate? : No Wt Readings from Last 3 Encounters:  09/12/17 143 lb 4.8 oz (65 kg)  09/05/17 145 lb (65.8 kg)  08/29/17 146 lb 6.4 oz (66.4 kg)  There were no vitals taken for this visit.Right eye being treated for blocked tear duct using antibacterial cream. Current Complaints / other details:  \ BP (!) 135/92 (BP Location: Right Arm, Patient Position: Sitting, Cuff Size: Normal)   Pulse (!) 105   Temp 97.7 F (36.5 C) (Oral)   Resp 18   Ht 5\' 3"  (1.6 m)   Wt 143 lb 3.2 oz (65 kg)   SpO2 100%   BMI 25.37 kg/m

## 2017-09-22 ENCOUNTER — Ambulatory Visit (HOSPITAL_COMMUNITY)
Admission: RE | Admit: 2017-09-22 | Discharge: 2017-09-22 | Disposition: A | Discharge status: Home / Self Care | Payer: Managed Care, Other (non HMO) | Source: Ambulatory Visit | Attending: Oncology | Admitting: Oncology

## 2017-09-22 ENCOUNTER — Encounter (HOSPITAL_COMMUNITY)
Admission: RE | Admit: 2017-09-22 | Discharge: 2017-09-22 | Disposition: A | Discharge status: Home / Self Care | Payer: Managed Care, Other (non HMO) | Source: Ambulatory Visit | Attending: Oncology | Admitting: Oncology

## 2017-09-22 DIAGNOSIS — C3411 Malignant neoplasm of upper lobe, right bronchus or lung: Secondary | ICD-10-CM

## 2017-09-22 DIAGNOSIS — C7951 Secondary malignant neoplasm of bone: Secondary | ICD-10-CM | POA: Insufficient documentation

## 2017-09-22 LAB — GLUCOSE, CAPILLARY: GLUCOSE-CAPILLARY: 92 mg/dL (ref 65–99)

## 2017-09-22 MED ORDER — GADOBENATE DIMEGLUMINE 529 MG/ML IV SOLN
15.0000 mL | Freq: Once | INTRAVENOUS | Status: AC | PRN
  Administered 2017-09-22: 14 mL via INTRAVENOUS

## 2017-09-22 MED ORDER — HEPARIN SODIUM (PORCINE) 1000 UNIT/ML IJ SOLN
Freq: Once | INTRAMUSCULAR | Status: DC
Start: 2017-09-22 — End: 2017-09-22

## 2017-09-22 MED ORDER — FLUDEOXYGLUCOSE F - 18 (FDG) INJECTION
7.3000 | Freq: Once | INTRAVENOUS | Status: AC | PRN
  Administered 2017-09-22: 7.3 via INTRAVENOUS

## 2017-09-22 MED ORDER — HEPARIN SOD (PORK) LOCK FLUSH 100 UNIT/ML IV SOLN
INTRAVENOUS | Status: AC
  Administered 2017-09-22: 18:00:00
  Filled 2017-09-22: qty 5

## 2017-09-25 ENCOUNTER — Encounter: Payer: Self-pay | Admitting: *Deleted

## 2017-09-25 ENCOUNTER — Other Ambulatory Visit: Payer: Self-pay

## 2017-09-25 ENCOUNTER — Telehealth: Payer: Self-pay

## 2017-09-25 ENCOUNTER — Ambulatory Visit
Admission: RE | Admit: 2017-09-25 | Discharge: 2017-09-25 | Disposition: A | Discharge status: Home / Self Care | Payer: Managed Care, Other (non HMO) | Source: Ambulatory Visit | Attending: Radiation Oncology | Admitting: Radiation Oncology

## 2017-09-25 ENCOUNTER — Encounter: Payer: Self-pay | Admitting: Radiation Oncology

## 2017-09-25 VITALS — BP 135/92 | HR 105 | Temp 97.7°F | Resp 18 | Ht 63.0 in | Wt 143.2 lb

## 2017-09-25 DIAGNOSIS — Z87891 Personal history of nicotine dependence: Secondary | ICD-10-CM | POA: Insufficient documentation

## 2017-09-25 DIAGNOSIS — Z79899 Other long term (current) drug therapy: Secondary | ICD-10-CM | POA: Insufficient documentation

## 2017-09-25 DIAGNOSIS — Z599 Problem related to housing and economic circumstances, unspecified: Secondary | ICD-10-CM

## 2017-09-25 DIAGNOSIS — Z9221 Personal history of antineoplastic chemotherapy: Secondary | ICD-10-CM | POA: Insufficient documentation

## 2017-09-25 DIAGNOSIS — C3411 Malignant neoplasm of upper lobe, right bronchus or lung: Secondary | ICD-10-CM | POA: Diagnosis not present

## 2017-09-25 DIAGNOSIS — Z598 Other problems related to housing and economic circumstances: Secondary | ICD-10-CM

## 2017-09-25 NOTE — Telephone Encounter (Signed)
Changed patient flush date per 3/4 patient requested it due to she had one recently done. Canceled appointment on the 5th and rescheduled it for the 4/16

## 2017-09-25 NOTE — Progress Notes (Signed)
[facility] Psychosocial Distress Screening Clinical Social Work  Clinical Social Work was referred by radiation oncology and distress screening protocol.  The patient scored a 6 on the Psychosocial Distress Thermometer which indicates moderate distress. Clinical Social Worker contacted patient at home to assess for distress and other psychosocial needs.  Patient expressed major concern for her insurance coverage.  Patient is receiving SSD and was enrolled in Medicare Part A, while also having insurance coverage from her husbands employer.  Patient stated her husband recently lost his job and therefore lost insurance coverage.  Patient has been to the social security office to enroll in part B, and was initially told Part B coverage would begin at the end of February.  Since then she has received a letter and spoken to representatives at the social security office who have provided different information.  Stating patient will not have Part B coverage until April.  This has created increase stress and anxiety as patient is "worried" about proceeding with treatment and appointments.  CSW encouraged patient to continue with scheduled appointments, and [facility] staff will assist in the insurance concerns.  Patient and patients husband have been to the social security office today and provided additional information.  Patient stated the [place] office plans to review patients records and contact her by Wednesday with updated information.  CSW offered additional support and encouraged patient to continue communicating with the [facility] office.  CSW will also research any additional information that could benefit patient.  CSW provided contact information and encouraged patient to call with questions or concerns.      ONCBCN DISTRESS SCREENING 09/25/2017  Screening Type Change in Status  Distress experienced in past week (1-10) 6  Practical problem type Insurance  Emotional problem type Nervousness/Anxiety  Information Concerns Type  Lack of info about treatment  Physical Problem type Pain;Swollen arms/legs  Physician notified of physical symptoms Yes    Johnnye Lana, MSW, LCSW, OSW-C Clinical Social Worker [place] [phone]

## 2017-09-25 NOTE — Progress Notes (Signed)
Radiation Oncology         [phone] ________________________________  Name: Rose Figueroa MRN: [id number]  Date: 09/25/2017  DOB: 22-Jan-1956  Follow-Up Visit Note  CC: [name], DO  Curt Bears, MD  Diagnosis:  62 y.o.  woman with recurrent metastatic Stage IIIB,T2aN3M0,NSCLC, adenocarcinoma of the right upper lobe with a total of 3 oligometastatic deposits  Interval Since Last Radiation:  2 years, 3 months  06/01/2015-07/16/2015:The primary tumor and involved mediastinal adenopathy were treated to 66 Gy in 33 fractions of 2 Gy.  Narrative:    Rose Figueroa is a pleasant 62 y.o woman with Stage IIIB,T2aN3M0,NSCLC, adenocarcinoma of the right upper lobe originally diagnosed in October 2016. She completed concurrent chemoRT between November and December 2016. She had a partial response and continued on systemic therapy with carboplatin and Alimta and completed 6 additional cycles of this. She has since remained in surveillance the last two years, but developed neck swelling on the left and was seen in symptom management clinic earlier in February. She underwent CT scan on 08/25/2017 revealing a 1 cm mass in the left supraclavicular region.  Her previously treated disease in the right lung appeared to be stable consistent with post treatment.  She underwent a ultrasound-guided biopsy of this site in the left neck on 09/05/2017 and final pathology is consistent with metastatic adenocarcinoma of lung primary.  PET scan on 09/22/2017 an MRI of the brain were performed.  Her MRI was negative for disease in the brain, but her PET scan revealed hypermetabolic activity at the left supraclavicular region, left scapula, and T2. Interestingly there is activity within the cord about the level of T12.  She comes today to discuss the role of palliative radiotherapy.  On review of systems, the patient reports that she is doing well overall. She has had some low back discomfort in the lower back and reports  this has been going on since October 2018. She reports some radiculopathy noted. She denies any chest pain, shortness of breath, cough, fevers, chills, night sweats, unintended weight changes. She denies any bowel or bladder disturbances, and denies abdominal pain, nausea or vomiting. She does note swelling and fullness in the lower left neck. She denies any other new musculoskeletal or joint aches or pains, new skin lesions or concerns. A complete review of systems is obtained and is otherwise negative.  Past Medical History:  Past Medical History:  Diagnosis Date  . Anxiety   . Back pain    right back lateral  . BCC (basal cell carcinoma of skin)   . Colon polyps   . Complication of anesthesia    pt. states that she is difficult to arouse  . Complication of anesthesia    "indigestion and burping " after eating.  . Diverticulosis   . Dysrhythmia    "skip beat in early 20's"  . H/O cold sores   . History of anemia   . Lung cancer ([place])    RUL mass -being tx- radiation and chemo- remains last chemo 01-04-16  . Osteoporosis   . PONV (postoperative nausea and vomiting)   . Rectal bleeding   . right upper lobe lung mass 04/15/2015   malignant tumor found - 10'16-radiation and chemotherapy and remains with chemo at present- Dr. Earlie Server follows.    Past Surgical History: Past Surgical History:  Procedure Laterality Date  . COLONOSCOPY    . COLONOSCOPY W/ POLYPECTOMY    . COLONOSCOPY WITH PROPOFOL N/A 01/20/2016   Procedure:  COLONOSCOPY WITH PROPOFOL;  Surgeon: Arta Silence, MD;  Location: [facility];  Service: Endoscopy;  Laterality: N/A;  . CYSTECTOMY     right breast x 2  . MEDIASTINOSCOPY N/A 05/07/2015   Procedure: MEDIASTINOSCOPY;  Surgeon: Ivin Poot, MD;  Location: [place];  Service: Thoracic;  Laterality: N/A;  . TONSILLECTOMY    . VIDEO BRONCHOSCOPY WITH ENDOBRONCHIAL ULTRASOUND N/A 05/07/2015   Procedure: VIDEO BRONCHOSCOPY WITH ENDOBRONCHIAL ULTRASOUND;  Surgeon:  Ivin Poot, MD;  Location: [facility] OR;  Service: Thoracic;  Laterality: N/A;    Social History:  Social History   Socioeconomic History  . Marital status: Married    Spouse name: Not on file  . Number of children: Not on file  . Years of education: Not on file  . Highest education level: Not on file  Social Needs  . Financial resource strain: Not on file  . Food insecurity - worry: Not on file  . Food insecurity - inability: Not on file  . Transportation needs - medical: Not on file  . Transportation needs - non-medical: Not on file  Occupational History  . Occupation: retired  Tobacco Use  . Smoking status: Former Smoker    Packs/day: 1.00    Years: 5.00    Pack years: 5.00    Types: Cigarettes    Last attempt to quit: 07/26/1975    Years since quitting: 42.1  . Smokeless tobacco: Never Used  Substance and Sexual Activity  . Alcohol use: Yes    Alcohol/week: 0.0 oz    Comment: occ-. none now.  . Drug use: No  . Sexual activity: Not on file  Other Topics Concern  . Not on file  Social History Narrative  . Not on file    Family History:No family history on file.   ALLERGIES:  has No Known Allergies.  Meds: Current Outpatient Medications  Medication Sig Dispense Refill  . Biotin 5000 MCG CAPS Take 5,000 mcg by mouth daily.    . Calcium Carb-Cholecalciferol (CALCIUM 1000 + D PO) Take 2 tablets by mouth 2 (two) times daily. D3 = " 25 mcg"    . cetirizine (ZYRTEC) 10 MG tablet Take 10 mg by mouth daily.     Marland Kitchen L-LYSINE PO Take 3,000 mg by mouth daily.     Marland Kitchen lidocaine-prilocaine (EMLA) cream Apply 1 application topically as needed. 30 g 0  . Misc Natural Products (TART CHERRY ADVANCED PO) Take 1,000 mg by mouth daily.     . Multiple Vitamins-Minerals (CENTRUM SILVER PO) Take 1 tablet by mouth daily.    . Omega-3 Fatty Acids (RA FISH OIL) 1400 MG CPDR Take 1 capsule by mouth 2 (two) times daily.     Marland Kitchen OVER THE COUNTER MEDICATION Take 1,000 mg by mouth daily. "spirolina'     . psyllium (METAMUCIL) 58.6 % powder Take 1 packet by mouth 3 (three) times daily.    . Turmeric Curcumin 500 MG CAPS Take 1 each by mouth daily.     . vitamin B-12 (CYANOCOBALAMIN) 1000 MCG tablet Take 1,000 mcg by mouth daily.     No current facility-administered medications for this encounter.     Physical Findings:  height is 5\' 3"  (1.6 m) and weight is 143 lb 3.2 oz (65 kg). Her oral temperature is 97.7 F (36.5 C). Her blood pressure is 135/92 (abnormal) and her pulse is 105 (abnormal). Her respiration is 18 and oxygen saturation is 100%.   In general this is a well-appearing Caucasian  female in no acute distress. She is alert and oriented 4 and appropriate throughout the examination. Cardiopulmonary assessment is negative for acute distress and she exhibits normal effort. There is fullness along the left neck and supraclavicular region as well as some discomfort with palpation over the mid low back.    Lab Findings: Lab Results  Component Value Date   WBC 3.9 (L) 09/05/2017   HGB 13.8 09/05/2017   HGB 13.3 03/20/2017   HCT 41.2 09/05/2017   HCT 40.1 03/20/2017   PLT 239 09/05/2017   PLT 253 08/14/2017   PLT 238 03/20/2017    Lab Results  Component Value Date   NA 140 08/29/2017   NA 139 03/20/2017   K 4.2 08/29/2017   K 4.3 03/20/2017   CHLORIDE 107 03/20/2017   CO2 28 08/29/2017   CO2 27 03/20/2017   GLUCOSE 81 08/29/2017   GLUCOSE 88 03/20/2017   BUN 14 08/29/2017   BUN 15.4 03/20/2017   CREATININE 0.81 08/29/2017   CREATININE 0.77 08/14/2017   CREATININE 0.7 03/20/2017   BILITOT 0.4 08/29/2017   BILITOT 0.4 08/14/2017   BILITOT 0.42 03/20/2017   ALKPHOS 34 (L) 08/29/2017   ALKPHOS 37 (L) 03/20/2017   AST 23 08/29/2017   AST 21 08/14/2017   AST 25 03/20/2017   ALT 16 08/29/2017   ALT 20 08/14/2017   ALT 22 03/20/2017   PROT 6.5 08/29/2017   PROT 6.6 03/20/2017   ALBUMIN 4.0 08/29/2017   ALBUMIN 3.8 03/20/2017   CALCIUM 9.2 08/29/2017   CALCIUM  9.8 03/20/2017   ANIONGAP 7 08/29/2017    Radiographic Findings: Mr Jeri Cos VO Contrast  Result Date: 09/22/2017 CLINICAL DATA:  62 year old female with metastatic lung cancer. Staging. EXAM: MRI HEAD WITHOUT AND WITH CONTRAST TECHNIQUE: Multiplanar, multiecho pulse sequences of the brain and surrounding structures were obtained without and with intravenous contrast. CONTRAST:  39mL MULTIHANCE GADOBENATE DIMEGLUMINE 529 MG/ML IV SOLN COMPARISON:  PET-CT today reported separately. Brain MRI 05/18/2015. FINDINGS: Brain: No abnormal enhancement identified. No midline shift, mass effect, or evidence of intracranial mass lesion. No dural thickening. Cerebral volume remains normal. No restricted diffusion to suggest acute infarction. No ventriculomegaly, extra-axial collection or acute intracranial hemorrhage. Cervicomedullary junction and pituitary are within normal limits. Pearline Cables and white matter signal is within normal limits for age throughout the brain. No chronic cerebral blood products. Vascular: Major intracranial vascular flow voids are stable since 2016. Chronic vertebrobasilar tortuosity re-demonstrated. Skull and upper cervical spine: Negative visible cervical spine and spinal cord. Calvarium bone marrow signal is stable and within normal limits. Sinuses/Orbits: Stable and negative. Other: Visible internal auditory structures appear normal. Mastoid air cells remain clear. Scalp and face soft tissues appear negative. IMPRESSION: No metastatic disease or acute intracranial abnormality. Stable since 2016 and normal for age MRI appearance of the brain. Electronically Signed   By: Genevie Ann M.D.   On: 09/22/2017 21:01   Nm Pet Image Restag (ps) Skull Base To Thigh  Result Date: 09/22/2017 CLINICAL DATA:  Subsequent treatment strategy for non-small cell lung cancer. Metastatic disease. EXAM: NUCLEAR MEDICINE PET SKULL BASE TO THIGH TECHNIQUE: 7.3 mCi F-18 FDG was injected intravenously. Full-ring PET imaging  was performed from the skull base to thigh after the radiotracer. CT data was obtained and used for attenuation correction and anatomic localization. Fasting blood glucose: 92 mg/dl Mediastinal blood pool activity: SUV max 2.8 COMPARISON:  CT 08/25/2017, 03/04/2016 FINDINGS: NECK: Solitary hypermetabolic LEFT level 5 lymph node measures 7  mm (image 36, series 4) with SUV max equal 5.9. Incidental CT findings: none CHEST: Lung consolidation superior to the RIGHT hilum adjacent to the mediastinal pleural surface has low metabolic activity SUV max equal 3.5 most consistent treated carcinoma. No hypermetabolic mediastinal lymph nodes. No suspicious nodules. Incidental CT findings: none ABDOMEN/PELVIS: No abnormal hypermetabolic activity within the liver, pancreas, adrenal glands, or spleen. No hypermetabolic lymph nodes in the abdomen or pelvis. Incidental CT findings: Uterus normal SKELETON: Hypermetabolic lesion within the T2 vertebral body. There is subtle sclerosis at this level on the CT (image 10, series 2). Lesion is intense with SUV max equal 8.5. Second hypermetabolic lesions present within the superior aspect of the LEFT scapula localizes to the acromion process with SUV max equals 6.7. Incidental CT findings: Multiple healed posterior RIGHT rib fractures IMPRESSION: 1. Hypermetabolic LEFT level V / supraclavicular lymph node corresponds to the enlarged lymph node on comparison neck CT 2. Two discrete foci of skeletal metastasis. One in the T2 vertebral body and one in the LEFT scapula. 3. Masslike consolidation superior to the RIGHT hilum with mild metabolic activity is favored post treatment benign consolidation. Electronically Signed   By: Suzy Bouchard M.D.   On: 09/22/2017 17:01   [place] Core Biopsy (lymph Nodes)  Result Date: 09/05/2017 INDICATION: 62 year old with history of non-small cell lung cancer. Patient has a new palpable nodule in the left supraclavicular region. Tissue diagnosis is needed.  EXAM: ULTRASOUND-GUIDED CORE BIOPSIES OF LEFT SUPRACLAVICULAR LYMPH NODE MEDICATIONS: None. ANESTHESIA/SEDATION: None FLUOROSCOPY TIME:  None COMPLICATIONS: None immediate. PROCEDURE: Informed written consent was obtained from the patient after a thorough discussion of the procedural risks, benefits and alternatives. All questions were addressed. A timeout was performed prior to the initiation of the procedure. Left supraclavicular area was evaluated with ultrasound. The palpable nodule was identified. Left side of the neck was prepped with chlorhexidine and sterile field was created. Skin and soft tissues were anesthetized with 1% lidocaine. Using ultrasound guidance, a total of 5 core biopsies were obtained with an 18 gauge device. Specimens placed in saline. Bandage placed over the puncture site. FINDINGS: Round nodule, presumed to be a lymph node. Small echogenic foci within this nodule. No significant bleeding or hematoma formation following the core biopsies. IMPRESSION: Ultrasound-guided core biopsies of left supraclavicular lymph node/nodule. Electronically Signed   By: Markus Daft M.D.   On: 09/05/2017 16:17    Impression/Plan: 1. 62 y.o.  woman with recurrent metastatic Stage IIIB,T2aN3M0, NSCLC, adenocarcinoma of the right upper lobe. We reviewed the patient's symptoms and imaging findings and reviews the rationale for palliative radiotherapy to the left supraclavicular site, the left scapula, and the T2 site. We discussed the risks, benefits, short, and long term effects of radiotherapy, and the patient is interested in proceeding. We discussed the delivery and logistics of radiotherapy and anticipates a course of 5 fractions with SBRT style approach. She is having concerns about financial obligations and would like to hold off on treatment planning in the immediate future, but is interested in proceeding with treatment. We will coordinate simulation once we find out how she'd like to  proceed. 2. Financial concerns. She will meet with social work here at the cancer center as well as with social services since she is on disability.     Carola Rhine, PAC    Tyler Pita, MD  [facility] Direct Dial: [phone]  Fax: [phone] [email or web address]  Skype  LinkedIn

## 2017-09-26 ENCOUNTER — Other Ambulatory Visit: Payer: Managed Care, Other (non HMO)

## 2017-09-27 ENCOUNTER — Telehealth: Payer: Self-pay | Admitting: Radiation Oncology

## 2017-09-27 ENCOUNTER — Encounter: Payer: Self-pay | Admitting: Radiation Oncology

## 2017-09-27 NOTE — Telephone Encounter (Addendum)
Received voicemail message from patient requesting a return call about need for MRI of lower spine. Per Shona Simpson, PA-C the activity noted in her lower spine is benign thus an MRI is NOT needed. Also, per Bryson Ha T2 needs to be treatment with radiation but nothing lower. Unable to reach patient via phone. Left voicemail message with my contact information requesting a return call.

## 2017-09-27 NOTE — Progress Notes (Signed)
The patient had questionable uptake in the T12 area of her spinal cord on PET and we reviewed this with Dr. Leonia Reeves and he said that with higher resolution PET imaging, this is a known phenomena, and this is felt to be benign uptake. The patient will be counseled on this as we discussed this in her consultation.

## 2017-09-28 ENCOUNTER — Ambulatory Visit: Payer: Managed Care, Other (non HMO) | Admitting: Internal Medicine

## 2017-09-28 NOTE — Telephone Encounter (Signed)
She's schedule fro 1pm simulation on 3/13

## 2017-09-28 NOTE — Telephone Encounter (Signed)
Unable to connect with patient yesterday I reached out again today. I was able to reach her this morning. I explained that per Shona Simpson, PA-C the activity noted in her lower spine is benign thus an MRI is NOT needed. Also, per Bryson Ha T2 needs to be treatment with radiation but nothing lower. Patient verbalized understanding. Patient reference her intentions to be present next Wednesday for her simulation. Patient understands to call this nurse with future needs or questions relevant to radiation therapy.

## 2017-10-02 ENCOUNTER — Encounter (HOSPITAL_COMMUNITY): Payer: Self-pay | Admitting: Internal Medicine

## 2017-10-04 ENCOUNTER — Ambulatory Visit: Payer: Managed Care, Other (non HMO) | Admitting: Radiation Oncology

## 2017-10-04 ENCOUNTER — Ambulatory Visit
Admission: RE | Admit: 2017-10-04 | Discharge: 2017-10-04 | Disposition: A | Discharge status: Home / Self Care | Payer: Medicare Other | Source: Ambulatory Visit | Attending: Radiation Oncology | Admitting: Radiation Oncology

## 2017-10-04 DIAGNOSIS — C3411 Malignant neoplasm of upper lobe, right bronchus or lung: Secondary | ICD-10-CM | POA: Insufficient documentation

## 2017-10-04 DIAGNOSIS — C770 Secondary and unspecified malignant neoplasm of lymph nodes of head, face and neck: Secondary | ICD-10-CM | POA: Diagnosis not present

## 2017-10-04 DIAGNOSIS — C7951 Secondary malignant neoplasm of bone: Secondary | ICD-10-CM | POA: Diagnosis not present

## 2017-10-04 DIAGNOSIS — Z510 Encounter for antineoplastic radiation therapy: Secondary | ICD-10-CM | POA: Insufficient documentation

## 2017-10-04 HISTORY — DX: Secondary malignant neoplasm of bone: C79.51

## 2017-10-04 HISTORY — DX: Secondary and unspecified malignant neoplasm of lymph nodes of head, face and neck: C77.0

## 2017-10-04 NOTE — Progress Notes (Signed)
  Radiation Oncology         [phone]) [phone] ________________________________  Name: Rose Figueroa MRN: [id number]  Date: 10/04/2017  DOB: 02-Mar-1956  STEREOTACTIC BODY RADIOTHERAPY SIMULATION AND TREATMENT PLANNING NOTE    ICD-10-CM   1. Bone metastasis ([place]) C79.51   2. Metastasis to supraclavicular lymph node ([facility]) C77.0     DIAGNOSIS: 62 year old woman with 3 oligo metastatic deposits from non-small cell carcinoma of the lung involving T2, the scapula, and supraclavicular lymph node, all on the left.  NARRATIVE:  The patient was brought to the [facility].  Identity was confirmed.  All relevant records and images related to the planned course of therapy were reviewed.  The patient freely provided informed written consent to proceed with treatment after reviewing the details related to the planned course of therapy. The consent form was witnessed and verified by the simulation staff.  Then, the patient was set-up in a stable reproducible  supine position for radiation therapy.  A BodyFix immobilization pillow was fabricated for reproducible positioning.  Surface markings were placed.  The CT images were loaded into the planning software.  The gross target volumes (GTV) and planning target volumes (PTV) were delinieated, and avoidance structures were contoured.  Treatment planning then occurred.  The radiation prescription was entered and confirmed.  A total of two complex treatment devices were fabricated in the form of the BodyFix immobilization pillow and a neck accuform cushion.  I have requested : 3D Simulation  I have requested a DVH of the following structures: targets and all normal structures near the target including left lung, right lung, spinal cord, and brachial plexus in addition to the targets as noted on the radiation plan to maintain doses in adherence with established limits  PLAN:  The patient will receive 50 Gy in 5  fractions.  ________________________________  Sheral Apley Tammi Klippel, M.D.

## 2017-10-05 ENCOUNTER — Encounter (HOSPITAL_COMMUNITY): Payer: Self-pay | Admitting: Internal Medicine

## 2017-10-05 ENCOUNTER — Encounter: Payer: Self-pay | Admitting: *Deleted

## 2017-10-05 NOTE — Progress Notes (Signed)
Oncology Nurse Navigator Documentation  Oncology Nurse Navigator Flowsheets 10/05/2017  Navigator Location [facility]  Navigator Encounter Type Treatment;Other/[facility] follow up with me and state they have a new financial application to complete.  I updated Ms. Santore yesterday and we completed form together. I faxed to [employer] [facility] and received verification fax was received.   Treatment Phase Treatment  Barriers/Navigation Needs Financial  Interventions Other  Acuity Level 2  Time Spent with Patient 30

## 2017-10-06 ENCOUNTER — Telehealth: Payer: Self-pay | Admitting: *Deleted

## 2017-10-06 NOTE — Telephone Encounter (Signed)
Oncology Nurse Navigator Documentation  Oncology Nurse Navigator Flowsheets 10/06/2017  Navigator Location [facility]  Navigator Encounter Type Telephone/I received a fax from [employer].  Rose Figueroa testing is 100% covered by them.  She will pay nothing out of pocket.  I called Rose Figueroa with an update, she was thankful.  Her case number for this is [id number].  Will send this document from [employer] to be scanned into patient's chart.  Telephone Outgoing Call  Treatment Phase Treatment  Barriers/Navigation Needs Financial;Education  Education Other  Education Method Verbal  Acuity Level 2  Time Spent with Patient 30

## 2017-10-09 ENCOUNTER — Telehealth: Payer: Self-pay | Admitting: Medical Oncology

## 2017-10-09 NOTE — Telephone Encounter (Signed)
LVM with  [employer] and dx codes.

## 2017-10-16 ENCOUNTER — Ambulatory Visit: Payer: Medicare Other

## 2017-10-17 ENCOUNTER — Telehealth: Payer: Self-pay | Admitting: Radiation Oncology

## 2017-10-17 NOTE — Telephone Encounter (Signed)
Received voicemail message from patient requesting return call. Phoned patient back to inquire further. Patient reports she received a called from the treatment therapist that her therapy has been pushed back because insurance denied to cover her treatment plan. Patient very anxious about delaying her treatment. Explained this RN would request one of the financial advisors, Ailene Ravel or [place], to call her directly and calm her concerns. In addition, patient questions why the number of treatments has changed from 5 as originally discussed with Dr. Tammi Klippel to 10. She understands this RN will discuss this with Dr. Tammi Klippel and phone her back to explain. Patient expressed appreciation for the return call.

## 2017-10-18 ENCOUNTER — Ambulatory Visit: Payer: Medicare Other

## 2017-10-18 DIAGNOSIS — C7951 Secondary malignant neoplasm of bone: Secondary | ICD-10-CM | POA: Diagnosis not present

## 2017-10-18 NOTE — Telephone Encounter (Signed)
Opened in error

## 2017-10-19 ENCOUNTER — Ambulatory Visit
Admission: RE | Admit: 2017-10-19 | Discharge: 2017-10-19 | Disposition: A | Discharge status: Home / Self Care | Payer: Medicare Other | Source: Ambulatory Visit | Attending: Radiation Oncology | Admitting: Radiation Oncology

## 2017-10-19 ENCOUNTER — Ambulatory Visit: Payer: Medicare Other

## 2017-10-19 DIAGNOSIS — C7951 Secondary malignant neoplasm of bone: Secondary | ICD-10-CM | POA: Diagnosis not present

## 2017-10-20 ENCOUNTER — Ambulatory Visit
Admission: RE | Admit: 2017-10-20 | Discharge: 2017-10-20 | Disposition: A | Discharge status: Home / Self Care | Payer: Medicare Other | Source: Ambulatory Visit | Attending: Radiation Oncology | Admitting: Radiation Oncology

## 2017-10-20 ENCOUNTER — Ambulatory Visit: Payer: Medicare Other

## 2017-10-20 DIAGNOSIS — C7951 Secondary malignant neoplasm of bone: Secondary | ICD-10-CM | POA: Diagnosis not present

## 2017-10-23 ENCOUNTER — Ambulatory Visit
Admission: RE | Admit: 2017-10-23 | Discharge: 2017-10-23 | Disposition: A | Discharge status: Home / Self Care | Payer: Medicare Other | Source: Ambulatory Visit | Attending: Radiation Oncology | Admitting: Radiation Oncology

## 2017-10-23 ENCOUNTER — Ambulatory Visit: Payer: Medicare Other

## 2017-10-23 DIAGNOSIS — C7951 Secondary malignant neoplasm of bone: Secondary | ICD-10-CM | POA: Diagnosis not present

## 2017-10-23 DIAGNOSIS — C3411 Malignant neoplasm of upper lobe, right bronchus or lung: Secondary | ICD-10-CM | POA: Insufficient documentation

## 2017-10-23 DIAGNOSIS — Z510 Encounter for antineoplastic radiation therapy: Secondary | ICD-10-CM | POA: Diagnosis not present

## 2017-10-23 DIAGNOSIS — C770 Secondary and unspecified malignant neoplasm of lymph nodes of head, face and neck: Secondary | ICD-10-CM | POA: Diagnosis not present

## 2017-10-24 ENCOUNTER — Ambulatory Visit: Payer: Medicare Other

## 2017-10-24 ENCOUNTER — Ambulatory Visit
Admission: RE | Admit: 2017-10-24 | Discharge: 2017-10-24 | Disposition: A | Discharge status: Home / Self Care | Payer: Medicare Other | Source: Ambulatory Visit | Attending: Radiation Oncology | Admitting: Radiation Oncology

## 2017-10-24 DIAGNOSIS — C770 Secondary and unspecified malignant neoplasm of lymph nodes of head, face and neck: Secondary | ICD-10-CM | POA: Diagnosis not present

## 2017-10-24 DIAGNOSIS — C3411 Malignant neoplasm of upper lobe, right bronchus or lung: Secondary | ICD-10-CM | POA: Diagnosis not present

## 2017-10-24 DIAGNOSIS — Z510 Encounter for antineoplastic radiation therapy: Secondary | ICD-10-CM | POA: Diagnosis not present

## 2017-10-24 DIAGNOSIS — C7951 Secondary malignant neoplasm of bone: Secondary | ICD-10-CM | POA: Diagnosis not present

## 2017-10-25 ENCOUNTER — Ambulatory Visit: Payer: Medicare Other

## 2017-10-25 ENCOUNTER — Ambulatory Visit
Admission: RE | Admit: 2017-10-25 | Discharge: 2017-10-25 | Disposition: A | Discharge status: Home / Self Care | Payer: Medicare Other | Source: Ambulatory Visit | Attending: Radiation Oncology | Admitting: Radiation Oncology

## 2017-10-25 DIAGNOSIS — C770 Secondary and unspecified malignant neoplasm of lymph nodes of head, face and neck: Secondary | ICD-10-CM | POA: Diagnosis not present

## 2017-10-25 DIAGNOSIS — C3411 Malignant neoplasm of upper lobe, right bronchus or lung: Secondary | ICD-10-CM | POA: Diagnosis not present

## 2017-10-25 DIAGNOSIS — Z510 Encounter for antineoplastic radiation therapy: Secondary | ICD-10-CM | POA: Diagnosis not present

## 2017-10-25 DIAGNOSIS — C7951 Secondary malignant neoplasm of bone: Secondary | ICD-10-CM | POA: Diagnosis not present

## 2017-10-26 ENCOUNTER — Ambulatory Visit: Payer: Medicare Other

## 2017-10-26 ENCOUNTER — Ambulatory Visit
Admission: RE | Admit: 2017-10-26 | Discharge: 2017-10-26 | Disposition: A | Discharge status: Home / Self Care | Payer: Medicare Other | Source: Ambulatory Visit | Attending: Radiation Oncology | Admitting: Radiation Oncology

## 2017-10-26 DIAGNOSIS — C3411 Malignant neoplasm of upper lobe, right bronchus or lung: Secondary | ICD-10-CM | POA: Diagnosis not present

## 2017-10-26 DIAGNOSIS — C770 Secondary and unspecified malignant neoplasm of lymph nodes of head, face and neck: Secondary | ICD-10-CM | POA: Diagnosis not present

## 2017-10-26 DIAGNOSIS — Z510 Encounter for antineoplastic radiation therapy: Secondary | ICD-10-CM | POA: Diagnosis not present

## 2017-10-26 DIAGNOSIS — C7951 Secondary malignant neoplasm of bone: Secondary | ICD-10-CM | POA: Diagnosis not present

## 2017-10-27 ENCOUNTER — Ambulatory Visit: Payer: Medicare Other

## 2017-10-27 ENCOUNTER — Ambulatory Visit
Admission: RE | Admit: 2017-10-27 | Discharge: 2017-10-27 | Disposition: A | Discharge status: Home / Self Care | Payer: Medicare Other | Source: Ambulatory Visit | Attending: Radiation Oncology | Admitting: Radiation Oncology

## 2017-10-27 DIAGNOSIS — C7951 Secondary malignant neoplasm of bone: Secondary | ICD-10-CM | POA: Diagnosis not present

## 2017-10-27 DIAGNOSIS — C770 Secondary and unspecified malignant neoplasm of lymph nodes of head, face and neck: Secondary | ICD-10-CM | POA: Diagnosis not present

## 2017-10-27 DIAGNOSIS — C3411 Malignant neoplasm of upper lobe, right bronchus or lung: Secondary | ICD-10-CM | POA: Diagnosis not present

## 2017-10-27 DIAGNOSIS — Z510 Encounter for antineoplastic radiation therapy: Secondary | ICD-10-CM | POA: Diagnosis not present

## 2017-10-30 ENCOUNTER — Other Ambulatory Visit: Payer: Self-pay | Admitting: Family Medicine

## 2017-10-30 ENCOUNTER — Ambulatory Visit
Admission: RE | Admit: 2017-10-30 | Discharge: 2017-10-30 | Disposition: A | Discharge status: Home / Self Care | Payer: Medicare Other | Source: Ambulatory Visit | Attending: Radiation Oncology | Admitting: Radiation Oncology

## 2017-10-30 DIAGNOSIS — C3411 Malignant neoplasm of upper lobe, right bronchus or lung: Secondary | ICD-10-CM | POA: Diagnosis not present

## 2017-10-30 DIAGNOSIS — Z510 Encounter for antineoplastic radiation therapy: Secondary | ICD-10-CM | POA: Diagnosis not present

## 2017-10-30 DIAGNOSIS — Z1231 Encounter for screening mammogram for malignant neoplasm of breast: Secondary | ICD-10-CM

## 2017-10-30 DIAGNOSIS — C770 Secondary and unspecified malignant neoplasm of lymph nodes of head, face and neck: Secondary | ICD-10-CM | POA: Diagnosis not present

## 2017-10-30 DIAGNOSIS — C7951 Secondary malignant neoplasm of bone: Secondary | ICD-10-CM | POA: Diagnosis not present

## 2017-10-31 ENCOUNTER — Ambulatory Visit: Payer: Medicare Other

## 2017-10-31 ENCOUNTER — Other Ambulatory Visit: Payer: Self-pay | Admitting: Family Medicine

## 2017-10-31 ENCOUNTER — Telehealth: Payer: Self-pay | Admitting: Internal Medicine

## 2017-10-31 ENCOUNTER — Encounter: Payer: Self-pay | Admitting: Internal Medicine

## 2017-10-31 ENCOUNTER — Inpatient Hospital Stay: Payer: Medicare Other | Attending: Internal Medicine | Admitting: Internal Medicine

## 2017-10-31 ENCOUNTER — Ambulatory Visit
Admission: RE | Admit: 2017-10-31 | Discharge: 2017-10-31 | Disposition: A | Discharge status: Home / Self Care | Payer: Medicare Other | Source: Ambulatory Visit | Attending: Radiation Oncology | Admitting: Radiation Oncology

## 2017-10-31 ENCOUNTER — Inpatient Hospital Stay: Payer: Medicare Other

## 2017-10-31 DIAGNOSIS — C3411 Malignant neoplasm of upper lobe, right bronchus or lung: Secondary | ICD-10-CM | POA: Diagnosis not present

## 2017-10-31 DIAGNOSIS — C3490 Malignant neoplasm of unspecified part of unspecified bronchus or lung: Secondary | ICD-10-CM

## 2017-10-31 DIAGNOSIS — C770 Secondary and unspecified malignant neoplasm of lymph nodes of head, face and neck: Secondary | ICD-10-CM | POA: Diagnosis not present

## 2017-10-31 DIAGNOSIS — Z923 Personal history of irradiation: Secondary | ICD-10-CM | POA: Diagnosis not present

## 2017-10-31 DIAGNOSIS — C7951 Secondary malignant neoplasm of bone: Secondary | ICD-10-CM | POA: Diagnosis not present

## 2017-10-31 DIAGNOSIS — Z9221 Personal history of antineoplastic chemotherapy: Secondary | ICD-10-CM | POA: Insufficient documentation

## 2017-10-31 DIAGNOSIS — M818 Other osteoporosis without current pathological fracture: Secondary | ICD-10-CM | POA: Diagnosis not present

## 2017-10-31 DIAGNOSIS — Z510 Encounter for antineoplastic radiation therapy: Secondary | ICD-10-CM | POA: Diagnosis not present

## 2017-10-31 DIAGNOSIS — M810 Age-related osteoporosis without current pathological fracture: Secondary | ICD-10-CM

## 2017-10-31 DIAGNOSIS — Z1231 Encounter for screening mammogram for malignant neoplasm of breast: Secondary | ICD-10-CM

## 2017-10-31 LAB — CMP (CANCER CENTER ONLY)
ALBUMIN: 3.9 g/dL (ref 3.5–5.0)
ALT: 17 U/L (ref 0–55)
ANION GAP: 6 (ref 3–11)
AST: 21 U/L (ref 5–34)
Alkaline Phosphatase: 33 U/L — ABNORMAL LOW (ref 40–150)
BILIRUBIN TOTAL: 0.3 mg/dL (ref 0.2–1.2)
BUN: 14 mg/dL (ref 7–26)
CHLORIDE: 108 mmol/L (ref 98–109)
CO2: 26 mmol/L (ref 22–29)
Calcium: 9.6 mg/dL (ref 8.4–10.4)
Creatinine: 0.78 mg/dL (ref 0.60–1.10)
GFR, Est AFR Am: >60 mL/min (ref >60)
GFR, Estimated: >60 mL/min (ref >60)
GLUCOSE: 92 mg/dL (ref 70–140)
POTASSIUM: 4.1 mmol/L (ref 3.5–5.1)
SODIUM: 140 mmol/L (ref 136–145)
TOTAL PROTEIN: 6.6 g/dL (ref 6.4–8.3)

## 2017-10-31 LAB — CBC WITH DIFFERENTIAL (CANCER CENTER ONLY)
BASOS ABS: 0 10*3/uL (ref 0.0–0.1)
BASOS PCT: 1 %
EOS ABS: 0.2 10*3/uL (ref 0.0–0.5)
Eosinophils Relative: 5 %
HEMATOCRIT: 40.6 % (ref 34.8–46.6)
Hemoglobin: 13.5 g/dL (ref 11.6–15.9)
Lymphocytes Relative: 38 %
Lymphs Abs: 1.1 10*3/uL (ref 0.9–3.3)
MCH: 31.4 pg (ref 25.1–34.0)
MCHC: 33.3 g/dL (ref 31.5–36.0)
MCV: 94.4 fL (ref 79.5–101.0)
MONO ABS: 0.4 10*3/uL (ref 0.1–0.9)
Monocytes Relative: 13 %
NEUTROS ABS: 1.2 10*3/uL — AB (ref 1.5–6.5)
NEUTROS PCT: 43 %
Platelet Count: 213 10*3/uL (ref 145–400)
RBC: 4.3 MIL/uL (ref 3.70–5.45)
RDW: 13.6 % (ref 11.2–14.5)
WBC Count: 2.8 10*3/uL — ABNORMAL LOW (ref 3.9–10.3)

## 2017-10-31 NOTE — Telephone Encounter (Signed)
Scheduled appt per 4/9 los - sent reminder letter in the mail.

## 2017-10-31 NOTE — Progress Notes (Signed)
.      [facility] [phone]   [phone]) [phone]  OFFICE PROGRESS NOTE  [name], DO [address] [place] [address]  DIAGNOSIS: Stage IIIB (T2a, N3, M0) non-small cell lung cancer, adenocarcinoma with negative EGFR mutation and negative ALK gene translocation diagnosed in October 2016.  Biomarker Findings Microsatellite status - MS-Stable Tumor Mutational Burden - TMB-Low (5 Muts/Mb) Genomic Findings For a complete list of the genes assayed, please refer to the Appendix. ROS1 SDC4-ROS1 fusion RBM10 N577f*14 TP53 R273C 7 Disease relevant genes with no reportable alterations: EGFR, KRAS, ALK, BRAF, MET, RET, ERBB2  PRIOR THERAPY:  1) Concurrent chemoradiation with weekly carboplatin for AUC of 2 and paclitaxel 45 MG/M2 status post 6 cycles. Last dose was given 07/13/2015 with partial response. 2) Systemic chemotherapy with carboplatin for AUC of 5 and Alimta 500 MG/M2 every 3 weeks. First dose 11/02/2015. Status post 6 cycles. 3) palliative stereotactic radiotherapy to the left supraclavicular lymph node under the care of Dr. MTammi Klippel  Loss of fraction of radiotherapy scheduled for November 01, 2017  CURRENT THERAPY: Observation.  INTERVAL HISTORY: BTEREA NEUBAUER62y.o. female returns to the clinic for follow-up visit accompanied by her husband.  The patient is feeling fine today with no specific complaints except for mild esophagitis from radiation treatment.  She is expected to complete this course of radiotherapy tomorrow.  She denied having any chest pain, shortness breath, cough or hemoptysis.  She has no nausea, vomiting, diarrhea or constipation.  Her molecular studies performed by [employer] showed the presence of ROS1 fusion.  The patient is here today for evaluation and discussion of her condition and treatment plan.  MEDICAL HISTORY: Past Medical History:  Diagnosis Date  . Anxiety   . Back pain    right back lateral  . BCC  (basal cell carcinoma of skin)   . Colon polyps   . Complication of anesthesia    pt. states that she is difficult to arouse  . Complication of anesthesia    "indigestion and burping " after eating.  . Diverticulosis   . Dysrhythmia    "skip beat in early 20's"  . H/O cold sores   . History of anemia   . Lung cancer ([facility]    RUL mass -being tx- radiation and chemo- remains last chemo 01-04-16  . Osteoporosis   . PONV (postoperative nausea and vomiting)   . Rectal bleeding   . right upper lobe lung mass 04/15/2015   malignant tumor found - 10'16-radiation and chemotherapy and remains with chemo at present- Dr. MEarlie Serverfollows.    ALLERGIES:  has No Known Allergies.  MEDICATIONS:  Current Outpatient Medications  Medication Sig Dispense Refill  . Biotin 5000 MCG CAPS Take 5,000 mcg by mouth daily.    . Calcium Carb-Cholecalciferol (CALCIUM 1000 + D PO) Take 2 tablets by mouth 2 (two) times daily. D3 = " 25 mcg"    . cetirizine (ZYRTEC) 10 MG tablet Take 10 mg by mouth daily.     .Marland KitchenL-LYSINE PO Take 3,000 mg by mouth daily.     .Marland Kitchenlidocaine-prilocaine (EMLA) cream Apply 1 application topically as needed. 30 g 0  . Misc Natural Products (TART CHERRY ADVANCED PO) Take 1,000 mg by mouth daily.     . Multiple Vitamins-Minerals (CENTRUM SILVER PO) Take 1 tablet by mouth daily.    . Omega-3 Fatty Acids (RA FISH OIL) 1400 MG CPDR Take 1 capsule  by mouth 2 (two) times daily.     Marland Kitchen OVER THE COUNTER MEDICATION Take 1,000 mg by mouth daily. "spirolina'    . psyllium (METAMUCIL) 58.6 % powder Take 1 packet by mouth 3 (three) times daily.    . Turmeric Curcumin 500 MG CAPS Take 1 each by mouth daily.     . vitamin B-12 (CYANOCOBALAMIN) 1000 MCG tablet Take 1,000 mcg by mouth daily.     No current facility-administered medications for this visit.     SURGICAL HISTORY:  Past Surgical History:  Procedure Laterality Date  . COLONOSCOPY    . COLONOSCOPY W/ POLYPECTOMY    . COLONOSCOPY WITH  PROPOFOL N/A 01/20/2016   Procedure: COLONOSCOPY WITH PROPOFOL;  Surgeon: Arta Silence, MD;  Location: [facility];  Service: Endoscopy;  Laterality: N/A;  . CYSTECTOMY     right breast x 2  . MEDIASTINOSCOPY N/A 05/07/2015   Procedure: MEDIASTINOSCOPY;  Surgeon: Ivin Poot, MD;  Location: [facility];  Service: Thoracic;  Laterality: N/A;  . TONSILLECTOMY    . VIDEO BRONCHOSCOPY WITH ENDOBRONCHIAL ULTRASOUND N/A 05/07/2015   Procedure: VIDEO BRONCHOSCOPY WITH ENDOBRONCHIAL ULTRASOUND;  Surgeon: Ivin Poot, MD;  Location: [facility];  Service: Thoracic;  Laterality: N/A;    REVIEW OF SYSTEMS:  A comprehensive review of systems was negative except for: Gastrointestinal: positive for odynophagia   PHYSICAL EXAMINATION: General appearance: alert, cooperative and no distress Head: Normocephalic, without obvious abnormality, atraumatic Neck: no adenopathy, no JVD, supple, symmetrical, trachea midline and thyroid not enlarged, symmetric, no tenderness/mass/nodules Lymph nodes: Cervical, supraclavicular, and axillary nodes normal. Resp: clear to auscultation bilaterally Back: symmetric, no curvature. ROM normal. No CVA tenderness. Cardio: regular rate and rhythm, S1, S2 normal, no murmur, click, rub or gallop GI: soft, non-tender; bowel sounds normal; no masses,  no organomegaly Extremities: extremities normal, atraumatic, no cyanosis or edema  ECOG PERFORMANCE STATUS: 0 - Asymptomatic  Blood pressure (!) 135/100, pulse 95, temperature 98.2 F (36.8 C), temperature source Oral, resp. rate 18, height _0  (1.6 m), weight 141 lb 14.4 oz (64.4 kg), SpO2 100 %.  LABORATORY DATA: Lab Results  Component Value Date   WBC 2.8 (L) 10/31/2017   HGB 13.8 09/05/2017   HCT 40.6 10/31/2017   MCV 94.4 10/31/2017   PLT 213 10/31/2017      Chemistry      Component Value Date/Time   NA 140 08/29/2017 0938   NA 139 03/20/2017 1038   K 4.2 08/29/2017 0938   K 4.3 03/20/2017 1038   CL 105  08/29/2017 0938   CO2 28 08/29/2017 0938   CO2 27 03/20/2017 1038   BUN 14 08/29/2017 0938   BUN 15.4 03/20/2017 1038   CREATININE 0.81 08/29/2017 0938   CREATININE 0.77 08/14/2017 1005   CREATININE 0.7 03/20/2017 1038      Component Value Date/Time   CALCIUM 9.2 08/29/2017 0938   CALCIUM 9.8 03/20/2017 1038   ALKPHOS 34 (L) 08/29/2017 0938   ALKPHOS 37 (L) 03/20/2017 1038   AST 23 08/29/2017 0938   AST 21 08/14/2017 1005   AST 25 03/20/2017 1038   ALT 16 08/29/2017 0938   ALT 20 08/14/2017 1005   ALT 22 03/20/2017 1038   BILITOT 0.4 08/29/2017 0938   BILITOT 0.4 08/14/2017 1005   BILITOT 0.42 03/20/2017 1038       RADIOGRAPHIC STUDIES: No results found. ASSESSMENT AND PLAN:  This is a very pleasant 62 years old white female with a stage IIIB non-small  cell lung cancer, adenocarcinoma with negative EGFR, ALK mutations diagnosed in October 2016.  Molecular studies showed positive ROS 1 She is status post course of concurrent chemoradiation followed by consolidation chemotherapy with carboplatin and Alimta. The patient has been in observation for close to 3 years. She is currently undergoing palliative radiotherapy to the left supraclavicular lymph nodes. I recommended for the patient to continue on observation for now.  We will repeat CT scan of the chest in 3 months for restaging of her disease and if there is any concerning findings for disease recurrence or progression, she may be consider for treatment with Xalkori as a target therapy for ROS1 She was advised to call immediately if she has any concerning symptoms in the interval. The patient voices understanding of current disease status and treatment options and is in agreement with the current care plan. All questions were answered. The patient knows to call the clinic with any problems, questions or concerns. We can certainly see the patient much sooner if necessary. I spent 10 minutes counseling the patient face to face.  The total time spent in the appointment was 15 minutes.  Disclaimer: This note was dictated with voice recognition software. Similar sounding words can inadvertently be transcribed and may not be corrected upon review.

## 2017-11-01 ENCOUNTER — Encounter: Payer: Self-pay | Admitting: Radiation Oncology

## 2017-11-01 ENCOUNTER — Ambulatory Visit
Admission: RE | Admit: 2017-11-01 | Discharge: 2017-11-01 | Disposition: A | Discharge status: Home / Self Care | Payer: Medicare Other | Source: Ambulatory Visit | Attending: Radiation Oncology | Admitting: Radiation Oncology

## 2017-11-01 ENCOUNTER — Telehealth: Payer: Self-pay | Admitting: Medical Oncology

## 2017-11-01 DIAGNOSIS — C770 Secondary and unspecified malignant neoplasm of lymph nodes of head, face and neck: Secondary | ICD-10-CM | POA: Diagnosis not present

## 2017-11-01 DIAGNOSIS — Z510 Encounter for antineoplastic radiation therapy: Secondary | ICD-10-CM | POA: Diagnosis not present

## 2017-11-01 DIAGNOSIS — C7951 Secondary malignant neoplasm of bone: Secondary | ICD-10-CM | POA: Diagnosis not present

## 2017-11-01 DIAGNOSIS — C3411 Malignant neoplasm of upper lobe, right bronchus or lung: Secondary | ICD-10-CM | POA: Diagnosis not present

## 2017-11-01 NOTE — Telephone Encounter (Signed)
Faxed labs to Dr Marin Comment.

## 2017-11-02 ENCOUNTER — Ambulatory Visit: Payer: Medicare Other

## 2017-11-02 DIAGNOSIS — M5440 Lumbago with sciatica, unspecified side: Secondary | ICD-10-CM | POA: Diagnosis not present

## 2017-11-02 DIAGNOSIS — Z0000 Encounter for general adult medical examination without abnormal findings: Secondary | ICD-10-CM | POA: Diagnosis not present

## 2017-11-02 DIAGNOSIS — G8929 Other chronic pain: Secondary | ICD-10-CM | POA: Diagnosis not present

## 2017-11-02 NOTE — Progress Notes (Signed)
  Radiation Oncology         [phone]) [phone] ________________________________  Name: Rose Figueroa MRN: [id number]  Date: 11/01/2017  DOB: 1956/04/04  End of Treatment Note  Diagnosis:   62 y.o. female with 3 oligo metastatic deposits from non-small cell carcinoma of the lung involving T2, the scapula, and supraclavicular lymph node, all on the left     Indication for treatment:  Palliative       Radiation treatment dates:   10/19/2017 - 11/01/2017  Site/dose:    1. Left Chest / 30 Gy in 10 fractions 2. Thoracic Spine / 30 Gy in 10 fractions  Beams/energy:    1. 3D / 6X Photon 2. IMRT / 6X Photon  Narrative: The patient tolerated radiation treatment relatively well and reports improvement in her thoracic spine pain since the start of radiation. She experienced mild fatigue and radiation-related skin changes. She had faint hyperpigmentation without desquamation to her left upper chest managed with Radiaplex as directed. She also reported mild difficulty with swallowing but denied pain, and reported that her dry cough and shortness of breath improved. She declined the need for Carafate which was offered to her.  Plan: The patient has completed radiation treatment. The patient will return to radiation oncology clinic for routine followup in one month. I advised her to call or return sooner if she has any questions or concerns related to her recovery or treatment. ________________________________  Sheral Apley. Tammi Klippel, M.D.  This document serves as a record of services personally performed by Tyler Pita, MD. It was created on his behalf by Rae Lips, a trained medical scribe. The creation of this record is based on the scribe's personal observations and the provider's statements to them. This document has been checked and approved by the attending provider.

## 2017-11-07 ENCOUNTER — Telehealth: Payer: Self-pay | Admitting: Internal Medicine

## 2017-11-07 ENCOUNTER — Inpatient Hospital Stay: Payer: Medicare Other

## 2017-11-07 DIAGNOSIS — Z923 Personal history of irradiation: Secondary | ICD-10-CM | POA: Diagnosis not present

## 2017-11-07 DIAGNOSIS — C3411 Malignant neoplasm of upper lobe, right bronchus or lung: Secondary | ICD-10-CM | POA: Diagnosis not present

## 2017-11-07 DIAGNOSIS — Z95828 Presence of other vascular implants and grafts: Secondary | ICD-10-CM

## 2017-11-07 DIAGNOSIS — M818 Other osteoporosis without current pathological fracture: Secondary | ICD-10-CM | POA: Diagnosis not present

## 2017-11-07 DIAGNOSIS — Z9221 Personal history of antineoplastic chemotherapy: Secondary | ICD-10-CM | POA: Diagnosis not present

## 2017-11-07 DIAGNOSIS — C770 Secondary and unspecified malignant neoplasm of lymph nodes of head, face and neck: Secondary | ICD-10-CM | POA: Diagnosis not present

## 2017-11-07 MED ORDER — SODIUM CHLORIDE 0.9 % IJ SOLN
10.0000 mL | INTRAMUSCULAR | Status: DC | PRN
  Administered 2017-11-07: 10 mL via INTRAVENOUS
  Filled 2017-11-07: qty 10

## 2017-11-07 MED ORDER — HEPARIN SOD (PORK) LOCK FLUSH 100 UNIT/ML IV SOLN
500.0000 [IU] | Freq: Once | INTRAVENOUS | Status: AC | PRN
  Administered 2017-11-07: 500 [IU] via INTRAVENOUS
  Filled 2017-11-07: qty 5

## 2017-11-07 NOTE — Telephone Encounter (Signed)
Patient came by scheduling to set up flush appointments for every 7 weeks

## 2017-12-01 ENCOUNTER — Ambulatory Visit: Payer: Managed Care, Other (non HMO)

## 2017-12-01 ENCOUNTER — Ambulatory Visit: Admission: RE | Admit: 2017-12-01 | Payer: Managed Care, Other (non HMO) | Source: Ambulatory Visit | Admitting: Urology

## 2017-12-01 ENCOUNTER — Other Ambulatory Visit: Payer: Managed Care, Other (non HMO)

## 2017-12-04 ENCOUNTER — Other Ambulatory Visit: Payer: Managed Care, Other (non HMO)

## 2017-12-04 ENCOUNTER — Ambulatory Visit: Payer: Managed Care, Other (non HMO)

## 2017-12-08 ENCOUNTER — Other Ambulatory Visit: Payer: Self-pay

## 2017-12-08 ENCOUNTER — Encounter: Payer: Self-pay | Admitting: Urology

## 2017-12-08 ENCOUNTER — Ambulatory Visit
Admission: RE | Admit: 2017-12-08 | Discharge: 2017-12-08 | Disposition: A | Discharge status: Home / Self Care | Payer: Medicare Other | Source: Ambulatory Visit | Attending: Urology | Admitting: Urology

## 2017-12-08 VITALS — BP 108/79 | HR 84 | Temp 98.1°F | Resp 18 | Wt 141.1 lb

## 2017-12-08 DIAGNOSIS — C771 Secondary and unspecified malignant neoplasm of intrathoracic lymph nodes: Secondary | ICD-10-CM | POA: Insufficient documentation

## 2017-12-08 DIAGNOSIS — C7951 Secondary malignant neoplasm of bone: Secondary | ICD-10-CM | POA: Diagnosis not present

## 2017-12-08 DIAGNOSIS — C770 Secondary and unspecified malignant neoplasm of lymph nodes of head, face and neck: Secondary | ICD-10-CM

## 2017-12-08 DIAGNOSIS — Z923 Personal history of irradiation: Secondary | ICD-10-CM | POA: Insufficient documentation

## 2017-12-08 DIAGNOSIS — Z79899 Other long term (current) drug therapy: Secondary | ICD-10-CM | POA: Insufficient documentation

## 2017-12-08 DIAGNOSIS — C3412 Malignant neoplasm of upper lobe, left bronchus or lung: Secondary | ICD-10-CM | POA: Insufficient documentation

## 2017-12-08 NOTE — Progress Notes (Addendum)
Patient states that she has pain in her back that radiates down to her right leg. States that she has mild fatigue. States that she still has skin discoloration  In the where she had radiation. Vitals:   12/08/17 1432  BP: 108/79  Pulse: 84  Resp: 18  Temp: 98.1 F (36.7 C)  TempSrc: Oral  SpO2: 99%  Weight: 141 lb 2 oz (64 kg)   Wt Readings from Last 3 Encounters:  12/08/17 141 lb 2 oz (64 kg)  10/31/17 141 lb 14.4 oz (64.4 kg)  09/25/17 143 lb 3.2 oz (65 kg)

## 2017-12-08 NOTE — Progress Notes (Signed)
Radiation Oncology         [phone] ________________________________  Name: Rose Figueroa MRN: [id number]  Date: 12/08/2017  DOB: 11/21/1955  Post Treatment Note  CC: [name], DO  Curt Bears, MD  Diagnosis:   62 y.o. female with 3 oligo metastatic deposits from non-small cell carcinoma of the lung involving T2, the scapula, and supraclavicular lymph node, all on the left     Interval Since Last Radiation:  5 weeks  10/19/2017 - 11/01/2017:    1. Left Chest / 30 Gy in 10 fractions 2. Thoracic Spine / 30 Gy in 10 fractions  06/01/2015-07/16/2015:The primary RUL tumor and involved mediastinal adenopathy were treated to 66 Gy in 33 fractions of 2 Gy.  Narrative:  The patient returns today for routine follow-up.  She tolerated radiation treatment relatively well and reports improvement in her thoracic spine pain since the start of radiation. She experienced mild fatigue and radiation-related skin changes. She had faint hyperpigmentation without desquamation to her left upper chest managed with Radiaplex as directed. She also reported mild difficulty with swallowing but denied pain, and reported that her dry cough and shortness of breath improved. She declined the need for Carafate which was offered to her.  On review of systems, the patient states that she is doing well overall.  She has noticed a significant decrease in size of the left supraclavicular nodule.  She continues to have intermittent pain in her upper back but reports that this is tolerable.  She has had complete resolution of the dysphasia that she experienced during her treatment and reports no issues regarding her skin at this point.  Her energy level is gradually improving.  She denies any abdominal pain, nausea, vomiting or diarrhea.  She has not had recent cough, hemoptysis, chest pain, increased shortness of breath or dyspnea.  She reports a healthy appetite and is maintaining her weight.  ALLERGIES:  has No  Known Allergies.  Meds: Current Outpatient Medications  Medication Sig Dispense Refill  . Biotin 5000 MCG CAPS Take 5,000 mcg by mouth daily.    . Calcium Carb-Cholecalciferol (CALCIUM 1000 + D PO) Take 2 tablets by mouth 2 (two) times daily. D3 = " 25 mcg"    . L-LYSINE PO Take 3,000 mg by mouth daily.     Marland Kitchen lidocaine-prilocaine (EMLA) cream Apply 1 application topically as needed. 30 g 0  . Omega-3 Fatty Acids (RA FISH OIL) 1400 MG CPDR Take 1 capsule by mouth 2 (two) times daily.     . psyllium (METAMUCIL) 58.6 % powder Take 1 packet by mouth 3 (three) times daily.    . Turmeric Curcumin 500 MG CAPS Take 1 each by mouth daily.     . vitamin B-12 (CYANOCOBALAMIN) 1000 MCG tablet Take 1,000 mcg by mouth daily.    . cetirizine (ZYRTEC) 10 MG tablet Take 10 mg by mouth daily.     . Misc Natural Products (TART CHERRY ADVANCED PO) Take 1,000 mg by mouth daily.     . Multiple Vitamins-Minerals (CENTRUM SILVER PO) Take 1 tablet by mouth daily.    Marland Kitchen OVER THE COUNTER MEDICATION Take 1,000 mg by mouth daily. "spirolina'     No current facility-administered medications for this encounter.     Physical Findings:  weight is 141 lb 2 oz (64 kg). Her oral temperature is 98.1 F (36.7 C). Her blood pressure is 108/79 and her pulse is 84. Her respiration is 18 and oxygen saturation is 99%.  Pain Assessment Pain Score: 5  Pain Loc: Back/10 In general this is a well appearing Caucasian female in no acute distress.  She's alert and oriented x4 and appropriate throughout the examination. Cardiopulmonary assessment is negative for acute distress and she exhibits normal effort.  There is mild residual hyperpigmentation in the treatment field without desquamation.  Lab Findings: Lab Results  Component Value Date   WBC 2.8 (L) 10/31/2017   HGB 13.5 10/31/2017   HCT 40.6 10/31/2017   MCV 94.4 10/31/2017   PLT 213 10/31/2017     Radiographic Findings: No results found.  Impression/Plan: 1. 62 y.o.  female with 3 oligo metastatic deposits from non-small cell carcinoma of the lung involving T2, the scapula, and supraclavicular lymph node, all on the left. She appears to have recovered well from the effects of radiotherapy.  She continues in observation under the care and direction of Dr. Mohamed regarding her systemic disease.  The plan is to proceed with follow-up systemic imaging in July prior to her scheduled follow-up with Dr. Mohammed. If there is any concerning findings for disease recurrence or progression, she may be considered for treatment with Xalkori as a target therapy for ROS1.  We discussed that while we are happy to continue to participate in her care if clinically indicated, at this point, we will plan to see her back on an as-needed basis.  She is in agreement with and is comfortable with this plan.  She knows to call at anytime with any questions or concerns related to her previous radiotherapy.    Ashlyn W. Bruning, PA-C 

## 2017-12-12 ENCOUNTER — Ambulatory Visit
Admission: RE | Admit: 2017-12-12 | Discharge: 2017-12-12 | Disposition: A | Discharge status: Home / Self Care | Payer: Managed Care, Other (non HMO) | Source: Ambulatory Visit | Attending: Family Medicine | Admitting: Family Medicine

## 2017-12-12 DIAGNOSIS — M810 Age-related osteoporosis without current pathological fracture: Secondary | ICD-10-CM

## 2017-12-12 DIAGNOSIS — Z780 Asymptomatic menopausal state: Secondary | ICD-10-CM | POA: Diagnosis not present

## 2017-12-12 DIAGNOSIS — M8589 Other specified disorders of bone density and structure, multiple sites: Secondary | ICD-10-CM | POA: Diagnosis not present

## 2017-12-12 DIAGNOSIS — Z1231 Encounter for screening mammogram for malignant neoplasm of breast: Secondary | ICD-10-CM

## 2017-12-22 DIAGNOSIS — Z136 Encounter for screening for cardiovascular disorders: Secondary | ICD-10-CM | POA: Diagnosis not present

## 2017-12-22 DIAGNOSIS — M810 Age-related osteoporosis without current pathological fracture: Secondary | ICD-10-CM | POA: Diagnosis not present

## 2017-12-22 DIAGNOSIS — Z13220 Encounter for screening for lipoid disorders: Secondary | ICD-10-CM | POA: Diagnosis not present

## 2017-12-22 DIAGNOSIS — Z5181 Encounter for therapeutic drug level monitoring: Secondary | ICD-10-CM | POA: Diagnosis not present

## 2017-12-22 DIAGNOSIS — E559 Vitamin D deficiency, unspecified: Secondary | ICD-10-CM | POA: Diagnosis not present

## 2017-12-26 ENCOUNTER — Inpatient Hospital Stay: Payer: Medicare Other | Attending: Internal Medicine

## 2017-12-26 VITALS — BP 130/80 | HR 85 | Temp 97.8°F | Resp 20

## 2017-12-26 DIAGNOSIS — Z452 Encounter for adjustment and management of vascular access device: Secondary | ICD-10-CM | POA: Insufficient documentation

## 2017-12-26 DIAGNOSIS — C3411 Malignant neoplasm of upper lobe, right bronchus or lung: Secondary | ICD-10-CM | POA: Diagnosis not present

## 2017-12-26 DIAGNOSIS — Z95828 Presence of other vascular implants and grafts: Secondary | ICD-10-CM

## 2017-12-26 MED ORDER — SODIUM CHLORIDE 0.9 % IJ SOLN
10.0000 mL | INTRAMUSCULAR | Status: DC | PRN
  Administered 2017-12-26: 10 mL via INTRAVENOUS
  Filled 2017-12-26: qty 10

## 2017-12-26 MED ORDER — HEPARIN SOD (PORK) LOCK FLUSH 100 UNIT/ML IV SOLN
500.0000 [IU] | Freq: Once | INTRAVENOUS | Status: AC | PRN
  Administered 2017-12-26: 500 [IU] via INTRAVENOUS
  Filled 2017-12-26: qty 5

## 2017-12-26 NOTE — Patient Instructions (Signed)
Implanted Port Home Guide An implanted port is a type of central line that is placed under the skin. Central lines are used to provide IV access when treatment or nutrition needs to be given through a person's veins. Implanted ports are used for long-term IV access. An implanted port may be placed because:  You need IV medicine that would be irritating to the small veins in your hands or arms.  You need long-term IV medicines, such as antibiotics.  You need IV nutrition for a long period.  You need frequent blood draws for lab tests.  You need dialysis.  Implanted ports are usually placed in the chest area, but they can also be placed in the upper arm, the abdomen, or the leg. An implanted port has two main parts:  Reservoir. The reservoir is round and will appear as a small, raised area under your skin. The reservoir is the part where a needle is inserted to give medicines or draw blood.  Catheter. The catheter is a thin, flexible tube that extends from the reservoir. The catheter is placed into a large vein. Medicine that is inserted into the reservoir goes into the catheter and then into the vein.  How will I care for my incision site? Do not get the incision site wet. Bathe or shower as directed by your health care provider. How is my port accessed? Special steps must be taken to access the port:  Before the port is accessed, a numbing cream can be placed on the skin. This helps numb the skin over the port site.  Your health care provider uses a sterile technique to access the port. ? Your health care provider must put on a mask and sterile gloves. ? The skin over your port is cleaned carefully with an antiseptic and allowed to dry. ? The port is gently pinched between sterile gloves, and a needle is inserted into the port.  Only "non-coring" port needles should be used to access the port. Once the port is accessed, a blood return should be checked. This helps ensure that the port  is in the vein and is not clogged.  If your port needs to remain accessed for a constant infusion, a clear (transparent) bandage will be placed over the needle site. The bandage and needle will need to be changed every week, or as directed by your health care provider.  Keep the bandage covering the needle clean and dry. Do not get it wet. Follow your health care provider's instructions on how to take a shower or bath while the port is accessed.  If your port does not need to stay accessed, no bandage is needed over the port.  What is flushing? Flushing helps keep the port from getting clogged. Follow your health care provider's instructions on how and when to flush the port. Ports are usually flushed with saline solution or a medicine called heparin. The need for flushing will depend on how the port is used.  If the port is used for intermittent medicines or blood draws, the port will need to be flushed: ? After medicines have been given. ? After blood has been drawn. ? As part of routine maintenance.  If a constant infusion is running, the port may not need to be flushed.  How long will my port stay implanted? The port can stay in for as long as your health care provider thinks it is needed. When it is time for the port to come out, surgery will be   done to remove it. The procedure is similar to the one performed when the port was put in. When should I seek immediate medical care? When you have an implanted port, you should seek immediate medical care if:  You notice a bad smell coming from the incision site.  You have swelling, redness, or drainage at the incision site.  You have more swelling or pain at the port site or the surrounding area.  You have a fever that is not controlled with medicine.  This information is not intended to replace advice given to you by your health care provider. Make sure you discuss any questions you have with your health care provider. Document  Released: 07/11/2005 Document Revised: 12/17/2015 Document Reviewed: 03/18/2013 Elsevier Interactive Patient Education  2017 [employer]  

## 2018-01-01 DIAGNOSIS — J069 Acute upper respiratory infection, unspecified: Secondary | ICD-10-CM | POA: Diagnosis not present

## 2018-01-01 DIAGNOSIS — C3490 Malignant neoplasm of unspecified part of unspecified bronchus or lung: Secondary | ICD-10-CM | POA: Diagnosis not present

## 2018-01-01 DIAGNOSIS — M810 Age-related osteoporosis without current pathological fracture: Secondary | ICD-10-CM | POA: Diagnosis not present

## 2018-01-05 ENCOUNTER — Other Ambulatory Visit: Payer: Managed Care, Other (non HMO)

## 2018-01-05 ENCOUNTER — Ambulatory Visit: Payer: Managed Care, Other (non HMO)

## 2018-01-26 ENCOUNTER — Ambulatory Visit (HOSPITAL_COMMUNITY)
Admission: RE | Admit: 2018-01-26 | Discharge: 2018-01-26 | Disposition: A | Discharge status: Home / Self Care | Payer: Medicare Other | Source: Ambulatory Visit | Attending: Internal Medicine | Admitting: Internal Medicine

## 2018-01-26 ENCOUNTER — Inpatient Hospital Stay: Payer: Medicare Other | Attending: Internal Medicine

## 2018-01-26 DIAGNOSIS — C3490 Malignant neoplasm of unspecified part of unspecified bronchus or lung: Secondary | ICD-10-CM

## 2018-01-26 DIAGNOSIS — C770 Secondary and unspecified malignant neoplasm of lymph nodes of head, face and neck: Secondary | ICD-10-CM | POA: Diagnosis not present

## 2018-01-26 DIAGNOSIS — C3411 Malignant neoplasm of upper lobe, right bronchus or lung: Secondary | ICD-10-CM | POA: Diagnosis not present

## 2018-01-26 DIAGNOSIS — Z95828 Presence of other vascular implants and grafts: Secondary | ICD-10-CM

## 2018-01-26 DIAGNOSIS — M899 Disorder of bone, unspecified: Secondary | ICD-10-CM | POA: Insufficient documentation

## 2018-01-26 DIAGNOSIS — Z923 Personal history of irradiation: Secondary | ICD-10-CM | POA: Insufficient documentation

## 2018-01-26 DIAGNOSIS — C3491 Malignant neoplasm of unspecified part of right bronchus or lung: Secondary | ICD-10-CM | POA: Diagnosis not present

## 2018-01-26 DIAGNOSIS — J984 Other disorders of lung: Secondary | ICD-10-CM | POA: Insufficient documentation

## 2018-01-26 LAB — COMPREHENSIVE METABOLIC PANEL
ALK PHOS: 32 U/L — AB (ref 38–126)
ALT: 18 U/L (ref 0–44)
ANION GAP: 8 (ref 5–15)
AST: 22 U/L (ref 15–41)
Albumin: 4 g/dL (ref 3.5–5.0)
BUN: 20 mg/dL (ref 8–23)
CALCIUM: 9.5 mg/dL (ref 8.9–10.3)
CO2: 28 mmol/L (ref 22–32)
CREATININE: 0.68 mg/dL (ref 0.44–1.00)
Chloride: 104 mmol/L (ref 98–111)
GFR calc Af Amer: >60 mL/min (ref >60)
GFR calc non Af Amer: >60 mL/min (ref >60)
Glucose, Bld: 87 mg/dL (ref 70–99)
Potassium: 4.1 mmol/L (ref 3.5–5.1)
SODIUM: 140 mmol/L (ref 135–145)
TOTAL PROTEIN: 6.5 g/dL (ref 6.5–8.1)
Total Bilirubin: 0.8 mg/dL (ref 0.3–1.2)

## 2018-01-26 LAB — CBC WITH DIFFERENTIAL (CANCER CENTER ONLY)
BASOS PCT: 1 %
Basophils Absolute: 0 10*3/uL (ref 0.0–0.1)
Eosinophils Absolute: 0.1 10*3/uL (ref 0.0–0.5)
Eosinophils Relative: 4 %
HEMATOCRIT: 39.4 % (ref 34.8–46.6)
Hemoglobin: 13.3 g/dL (ref 11.6–15.9)
Lymphocytes Relative: 38 %
Lymphs Abs: 1 10*3/uL (ref 0.9–3.3)
MCH: 31.8 pg (ref 25.1–34.0)
MCHC: 33.7 g/dL (ref 31.5–36.0)
MCV: 94.3 fL (ref 79.5–101.0)
MONO ABS: 0.3 10*3/uL (ref 0.1–0.9)
MONOS PCT: 11 %
NEUTROS ABS: 1.3 10*3/uL — AB (ref 1.5–6.5)
Neutrophils Relative %: 46 %
Platelet Count: 214 10*3/uL (ref 145–400)
RBC: 4.18 MIL/uL (ref 3.70–5.45)
RDW: 13.9 % (ref 11.2–14.5)
WBC Count: 2.7 10*3/uL — ABNORMAL LOW (ref 3.9–10.3)

## 2018-01-26 MED ORDER — SODIUM CHLORIDE 0.9 % IJ SOLN
10.0000 mL | INTRAMUSCULAR | Status: DC | PRN
  Administered 2018-01-26: 10 mL via INTRAVENOUS
  Filled 2018-01-26: qty 10

## 2018-01-26 MED ORDER — HEPARIN SOD (PORK) LOCK FLUSH 100 UNIT/ML IV SOLN
INTRAVENOUS | Status: AC
  Filled 2018-01-26: qty 5

## 2018-01-26 MED ORDER — HEPARIN SOD (PORK) LOCK FLUSH 100 UNIT/ML IV SOLN
500.0000 [IU] | Freq: Once | INTRAVENOUS | Status: AC
  Administered 2018-01-26: 500 [IU] via INTRAVENOUS

## 2018-01-26 MED ORDER — IOHEXOL 300 MG/ML  SOLN
75.0000 mL | Freq: Once | INTRAMUSCULAR | Status: AC | PRN
  Administered 2018-01-26: 75 mL via INTRAVENOUS

## 2018-01-30 ENCOUNTER — Telehealth: Payer: Self-pay | Admitting: Internal Medicine

## 2018-01-30 ENCOUNTER — Inpatient Hospital Stay (HOSPITAL_BASED_OUTPATIENT_CLINIC_OR_DEPARTMENT_OTHER): Payer: Medicare Other | Admitting: Internal Medicine

## 2018-01-30 ENCOUNTER — Encounter: Payer: Self-pay | Admitting: Internal Medicine

## 2018-01-30 VITALS — BP 137/92 | HR 80 | Temp 98.2°F | Resp 18 | Ht 63.0 in | Wt 140.0 lb

## 2018-01-30 DIAGNOSIS — C770 Secondary and unspecified malignant neoplasm of lymph nodes of head, face and neck: Secondary | ICD-10-CM | POA: Diagnosis not present

## 2018-01-30 DIAGNOSIS — C3490 Malignant neoplasm of unspecified part of unspecified bronchus or lung: Secondary | ICD-10-CM

## 2018-01-30 DIAGNOSIS — Z923 Personal history of irradiation: Secondary | ICD-10-CM | POA: Diagnosis not present

## 2018-01-30 DIAGNOSIS — C3411 Malignant neoplasm of upper lobe, right bronchus or lung: Secondary | ICD-10-CM

## 2018-01-30 NOTE — Progress Notes (Signed)
.      [place] [phone]   Fax:(336) [phone]  OFFICE PROGRESS NOTE  [name], DO [address] [place] [address]  DIAGNOSIS: Stage IIIB (T2a, N3, M0) non-small cell lung cancer, adenocarcinoma with negative EGFR mutation and negative ALK gene translocation diagnosed in October 2016.  Biomarker Findings Microsatellite status - MS-Stable Tumor Mutational Burden - TMB-Low (5 Muts/Mb) Genomic Findings For a complete list of the genes assayed, please refer to the Appendix. ROS1 SDC4-ROS1 fusion RBM10 N584f*14 TP53 R273C 7 Disease relevant genes with no reportable alterations: EGFR, KRAS, ALK, BRAF, MET, RET, ERBB2  PRIOR THERAPY:  1) Concurrent chemoradiation with weekly carboplatin for AUC of 2 and paclitaxel 45 MG/M2 status post 6 cycles. Last dose was given 07/13/2015 with partial response. 2) Systemic chemotherapy with carboplatin for AUC of 5 and Alimta 500 MG/M2 every 3 weeks. First dose 11/02/2015. Status post 6 cycles. 3) palliative stereotactic radiotherapy to the left supraclavicular lymph node under the care of Dr. MTammi Klippel  Loss of fraction of radiotherapy scheduled for November 01, 2017  CURRENT THERAPY: Observation.  INTERVAL HISTORY: BDIOR DOMINIK89y.o. female returns to the clinic today for follow-up visit.  The patient is feeling fine with no concerning complaints.  She denied having any chest pain, shortness of breath, cough or hemoptysis.  She has no weight loss or night sweats.  She has no nausea, vomiting, diarrhea or constipation.  She has been in observation for the last few months and she is feeling fine.  She had a repeat CT scan of the chest performed recently and she is here for evaluation and discussion of her risk her results.  MEDICAL HISTORY: Past Medical History:  Diagnosis Date  . Anxiety   . Back pain    right back lateral  . BCC (basal cell carcinoma of skin)   . Colon polyps   . Complication of  anesthesia    pt. states that she is difficult to arouse  . Complication of anesthesia    "indigestion and burping " after eating.  . Diverticulosis   . Dysrhythmia    "skip beat in early 20's"  . H/O cold sores   . History of anemia   . Lung cancer ([facility]    RUL mass -being tx- radiation and chemo- remains last chemo 01-04-16  . Osteoporosis   . PONV (postoperative nausea and vomiting)   . Rectal bleeding   . right upper lobe lung mass 04/15/2015   malignant tumor found - 10'16-radiation and chemotherapy and remains with chemo at present- Dr. MEarlie Serverfollows.    ALLERGIES:  has No Known Allergies.  MEDICATIONS:  Current Outpatient Medications  Medication Sig Dispense Refill  . Biotin 5000 MCG CAPS Take 5,000 mcg by mouth daily.    . Calcium Carb-Cholecalciferol (CALCIUM 1000 + D PO) Take 2 tablets by mouth 2 (two) times daily. D3 = " 25 mcg"    . cetirizine (ZYRTEC) 10 MG tablet Take 10 mg by mouth daily.     .Marland KitchenL-LYSINE PO Take 3,000 mg by mouth daily.     .Marland Kitchenlidocaine-prilocaine (EMLA) cream Apply 1 application topically as needed. 30 g 0  . Misc Natural Products (TART CHERRY ADVANCED PO) Take 1,000 mg by mouth daily.     . Multiple Vitamins-Minerals (CENTRUM SILVER PO) Take 1 tablet by mouth daily.    . Omega-3 Fatty Acids (RA FISH OIL) 1400 MG CPDR Take 1 capsule by  mouth 2 (two) times daily.     Marland Kitchen OVER THE COUNTER MEDICATION Take 1,000 mg by mouth daily. "spirolina'    . psyllium (METAMUCIL) 58.6 % powder Take 1 packet by mouth 3 (three) times daily.    . Turmeric Curcumin 500 MG CAPS Take 1 each by mouth daily.     . vitamin B-12 (CYANOCOBALAMIN) 1000 MCG tablet Take 1,000 mcg by mouth daily.     No current facility-administered medications for this visit.    Facility-Administered Medications Ordered in Other Visits  Medication Dose Route Frequency Provider Last Rate Last Dose  . sodium chloride 0.9 % injection 10 mL  10 mL Intravenous PRN Curt Bears, MD   10 mL at  01/26/18 1040    SURGICAL HISTORY:  Past Surgical History:  Procedure Laterality Date  . COLONOSCOPY    . COLONOSCOPY W/ POLYPECTOMY    . COLONOSCOPY WITH PROPOFOL N/A 01/20/2016   Procedure: COLONOSCOPY WITH PROPOFOL;  Surgeon: Arta Silence, MD;  Location: [facility];  Service: Endoscopy;  Laterality: N/A;  . CYSTECTOMY     right breast x 2  . MEDIASTINOSCOPY N/A 05/07/2015   Procedure: MEDIASTINOSCOPY;  Surgeon: Ivin Poot, MD;  Location: [facility];  Service: Thoracic;  Laterality: N/A;  . TONSILLECTOMY    . VIDEO BRONCHOSCOPY WITH ENDOBRONCHIAL ULTRASOUND N/A 05/07/2015   Procedure: VIDEO BRONCHOSCOPY WITH ENDOBRONCHIAL ULTRASOUND;  Surgeon: Ivin Poot, MD;  Location: [facility] OR;  Service: Thoracic;  Laterality: N/A;    REVIEW OF SYSTEMS:  A comprehensive review of systems was negative.   PHYSICAL EXAMINATION: General appearance: alert, cooperative and no distress Head: Normocephalic, without obvious abnormality, atraumatic Neck: no adenopathy, no JVD, supple, symmetrical, trachea midline and thyroid not enlarged, symmetric, no tenderness/mass/nodules Lymph nodes: Cervical, supraclavicular, and axillary nodes normal. Resp: clear to auscultation bilaterally Back: symmetric, no curvature. ROM normal. No CVA tenderness. Cardio: regular rate and rhythm, S1, S2 normal, no murmur, click, rub or gallop GI: soft, non-tender; bowel sounds normal; no masses,  no organomegaly Extremities: extremities normal, atraumatic, no cyanosis or edema  ECOG PERFORMANCE STATUS: 0 - Asymptomatic  Blood pressure (!) 137/92, pulse 80, temperature 98.2 F (36.8 C), temperature source Oral, resp. rate 18, height 5' 3"  (1.6 m), weight 140 lb (63.5 kg), SpO2 100 %.  LABORATORY DATA: Lab Results  Component Value Date   WBC 2.7 (L) 01/26/2018   HGB 13.3 01/26/2018   HCT 39.4 01/26/2018   MCV 94.3 01/26/2018   PLT 214 01/26/2018      Chemistry      Component Value Date/Time   NA 140 01/26/2018  1011   NA 139 03/20/2017 1038   K 4.1 01/26/2018 1011   K 4.3 03/20/2017 1038   CL 104 01/26/2018 1011   CO2 28 01/26/2018 1011   CO2 27 03/20/2017 1038   BUN 20 01/26/2018 1011   BUN 15.4 03/20/2017 1038   CREATININE 0.68 01/26/2018 1011   CREATININE 0.78 10/31/2017 0844   CREATININE 0.7 03/20/2017 1038      Component Value Date/Time   CALCIUM 9.5 01/26/2018 1011   CALCIUM 9.8 03/20/2017 1038   ALKPHOS 32 (L) 01/26/2018 1011   ALKPHOS 37 (L) 03/20/2017 1038   AST 22 01/26/2018 1011   AST 21 10/31/2017 0844   AST 25 03/20/2017 1038   ALT 18 01/26/2018 1011   ALT 17 10/31/2017 0844   ALT 22 03/20/2017 1038   BILITOT 0.8 01/26/2018 1011   BILITOT 0.3 10/31/2017 0844   BILITOT  0.42 03/20/2017 1038       RADIOGRAPHIC STUDIES: Ct Chest W Contrast  Result Date: 01/26/2018 CLINICAL DATA:  Right-sided lung cancer. EXAM: CT CHEST WITH CONTRAST TECHNIQUE: Multidetector CT imaging of the chest was performed during intravenous contrast administration. CONTRAST:  43m OMNIPAQUE IOHEXOL 300 MG/ML  SOLN COMPARISON:  PET-CT 09/22/2017.  Chest CT 08/25/2017. FINDINGS: Cardiovascular: The heart size is normal. No substantial pericardial effusion. No thoracic aortic aneurysm. Left Port-A-Cath tip is positioned at the SVC/RA junction. Mediastinum/Nodes: No mediastinal lymphadenopathy. No left hilar lymphadenopathy. Abnormal soft tissue attenuation in the right hilum is similar to prior. There is no axillary lymphadenopathy. Lungs/Pleura: The central tracheobronchial airways are patent. Radiation scarring in the medial right lung is similar to prior. Architectural distortion in the upper right lung is stable. No suspicious new pulmonary nodule or mass. No pleural effusion. Upper Abdomen: Tiny hypoattenuating lesions in the periphery of the liver are unchanged. No adrenal nodule or mass. Musculoskeletal: Interval progression of sclerotic lesion in the left T2 vertebral body (5:25). Sclerosis in the left  scapula the coracoid is similar to prior. Nonacute posterior right rib fractures evident. IMPRESSION: 1. Stable exam without new or progressive interval findings since 09/22/2017. 2. Sclerotic lesions in the T2 vertebral body left scapula are seen to be hypermetabolic on previous PET-CT. 3. Post radiation scarring in the medial right lung is similar to prior. Electronically Signed   By: EMisty StanleyM.D.   On: 01/26/2018 14:30   ASSESSMENT AND PLAN:  This is a very pleasant 62years old white female with a stage IIIB non-small cell lung cancer, adenocarcinoma with negative EGFR, ALK mutations diagnosed in October 2016.  Molecular studies showed positive ROS 1 She is status post course of concurrent chemoradiation followed by consolidation chemotherapy with carboplatin and Alimta. The patient has been in observation for close to 3 years. She underwent palliative radiotherapy to the left supraclavicular lymph nodes with improvement of her disease. She had repeat CT scan of the chest performed recently.  I personally and independently reviewed the scan and discussed the results with the patient today.  Her scan showed no concerning findings for disease progression. I recommended for the patient to continue on observation with repeat CT scan of the chest in 3 months. The patient was advised to call immediately if she has any concerning symptoms in the interval. The patient voices understanding of current disease status and treatment options and is in agreement with the current care plan. All questions were answered. The patient knows to call the clinic with any problems, questions or concerns. We can certainly see the patient much sooner if necessary. I spent 10 minutes counseling the patient face to face. The total time spent in the appointment was 15 minutes.  Disclaimer: This note was dictated with voice recognition software. Similar sounding words can inadvertently be transcribed and may not be  corrected upon review.

## 2018-01-30 NOTE — Telephone Encounter (Signed)
Scheduled appt per 7/9 los - sent reminder letter in the mail with appt date and time f/u in 3 months.

## 2018-02-06 ENCOUNTER — Telehealth: Payer: Self-pay | Admitting: Internal Medicine

## 2018-02-06 NOTE — Telephone Encounter (Signed)
Tried to reach regarding voicemail °

## 2018-02-09 ENCOUNTER — Telehealth: Payer: Self-pay | Admitting: Radiation Oncology

## 2018-02-09 NOTE — Telephone Encounter (Signed)
Received voicemail message from patient requesting information about itemized billing statement. Sent inbasket message to [employer] requesting she reach out. Phoned patient. Explained I received her message and to be on the look out for a call from [place]. Patient verbalized understanding and expressed appreciation for the call.

## 2018-03-09 ENCOUNTER — Inpatient Hospital Stay: Payer: Medicare Other | Attending: Internal Medicine

## 2018-03-09 ENCOUNTER — Telehealth: Payer: Self-pay | Admitting: Internal Medicine

## 2018-03-09 DIAGNOSIS — Z95828 Presence of other vascular implants and grafts: Secondary | ICD-10-CM

## 2018-03-09 DIAGNOSIS — Z452 Encounter for adjustment and management of vascular access device: Secondary | ICD-10-CM | POA: Insufficient documentation

## 2018-03-09 DIAGNOSIS — C3411 Malignant neoplasm of upper lobe, right bronchus or lung: Secondary | ICD-10-CM | POA: Diagnosis not present

## 2018-03-09 MED ORDER — SODIUM CHLORIDE 0.9 % IJ SOLN
10.0000 mL | INTRAMUSCULAR | Status: DC | PRN
  Administered 2018-03-09: 10 mL via INTRAVENOUS
  Filled 2018-03-09: qty 10

## 2018-03-09 MED ORDER — HEPARIN SOD (PORK) LOCK FLUSH 100 UNIT/ML IV SOLN
500.0000 [IU] | Freq: Once | INTRAVENOUS | Status: AC | PRN
  Administered 2018-03-09: 500 [IU] via INTRAVENOUS
  Filled 2018-03-09: qty 5

## 2018-03-09 NOTE — Patient Instructions (Signed)
Implanted Port Home Guide An implanted port is a type of central line that is placed under the skin. Central lines are used to provide IV access when treatment or nutrition needs to be given through a person's veins. Implanted ports are used for long-term IV access. An implanted port may be placed because:  You need IV medicine that would be irritating to the small veins in your hands or arms.  You need long-term IV medicines, such as antibiotics.  You need IV nutrition for a long period.  You need frequent blood draws for lab tests.  You need dialysis.  Implanted ports are usually placed in the chest area, but they can also be placed in the upper arm, the abdomen, or the leg. An implanted port has two main parts:  Reservoir. The reservoir is round and will appear as a small, raised area under your skin. The reservoir is the part where a needle is inserted to give medicines or draw blood.  Catheter. The catheter is a thin, flexible tube that extends from the reservoir. The catheter is placed into a large vein. Medicine that is inserted into the reservoir goes into the catheter and then into the vein.  How will I care for my incision site? Do not get the incision site wet. Bathe or shower as directed by your health care provider. How is my port accessed? Special steps must be taken to access the port:  Before the port is accessed, a numbing cream can be placed on the skin. This helps numb the skin over the port site.  Your health care provider uses a sterile technique to access the port. ? Your health care provider must put on a mask and sterile gloves. ? The skin over your port is cleaned carefully with an antiseptic and allowed to dry. ? The port is gently pinched between sterile gloves, and a needle is inserted into the port.  Only "non-coring" port needles should be used to access the port. Once the port is accessed, a blood return should be checked. This helps ensure that the port  is in the vein and is not clogged.  If your port needs to remain accessed for a constant infusion, a clear (transparent) bandage will be placed over the needle site. The bandage and needle will need to be changed every week, or as directed by your health care provider.  Keep the bandage covering the needle clean and dry. Do not get it wet. Follow your health care provider's instructions on how to take a shower or bath while the port is accessed.  If your port does not need to stay accessed, no bandage is needed over the port.  What is flushing? Flushing helps keep the port from getting clogged. Follow your health care provider's instructions on how and when to flush the port. Ports are usually flushed with saline solution or a medicine called heparin. The need for flushing will depend on how the port is used.  If the port is used for intermittent medicines or blood draws, the port will need to be flushed: ? After medicines have been given. ? After blood has been drawn. ? As part of routine maintenance.  If a constant infusion is running, the port may not need to be flushed.  How long will my port stay implanted? The port can stay in for as long as your health care provider thinks it is needed. When it is time for the port to come out, surgery will be   done to remove it. The procedure is similar to the one performed when the port was put in. When should I seek immediate medical care? When you have an implanted port, you should seek immediate medical care if:  You notice a bad smell coming from the incision site.  You have swelling, redness, or drainage at the incision site.  You have more swelling or pain at the port site or the surrounding area.  You have a fever that is not controlled with medicine.  This information is not intended to replace advice given to you by your health care provider. Make sure you discuss any questions you have with your health care provider. Document  Released: 07/11/2005 Document Revised: 12/17/2015 Document Reviewed: 03/18/2013 Elsevier Interactive Patient Education  2017 [employer]  

## 2018-03-09 NOTE — Telephone Encounter (Signed)
Patient requested to cancel and r/s upcoming port flush appts.

## 2018-04-24 ENCOUNTER — Telehealth: Payer: Self-pay | Admitting: Internal Medicine

## 2018-04-24 ENCOUNTER — Telehealth: Payer: Self-pay | Admitting: Medical Oncology

## 2018-04-24 NOTE — Telephone Encounter (Signed)
Needs earlier appt on the 9th,. . Schedule message sent .

## 2018-04-24 NOTE — Telephone Encounter (Signed)
Tried to call regarding voicemail, patient want to change dates.

## 2018-04-25 ENCOUNTER — Telehealth: Payer: Self-pay | Admitting: Internal Medicine

## 2018-04-25 NOTE — Telephone Encounter (Signed)
R/s appt per 10/1 sch message - pt is aware of change - r/s next available with MM and when patient will be back in town .

## 2018-04-30 ENCOUNTER — Inpatient Hospital Stay: Payer: Medicare HMO | Attending: Internal Medicine

## 2018-04-30 ENCOUNTER — Other Ambulatory Visit: Payer: Managed Care, Other (non HMO)

## 2018-04-30 ENCOUNTER — Ambulatory Visit (HOSPITAL_COMMUNITY)
Admission: RE | Admit: 2018-04-30 | Discharge: 2018-04-30 | Disposition: A | Discharge status: Home / Self Care | Payer: Medicare HMO | Source: Ambulatory Visit | Attending: Internal Medicine | Admitting: Internal Medicine

## 2018-04-30 ENCOUNTER — Inpatient Hospital Stay: Payer: Medicare HMO

## 2018-04-30 DIAGNOSIS — R918 Other nonspecific abnormal finding of lung field: Secondary | ICD-10-CM | POA: Diagnosis not present

## 2018-04-30 DIAGNOSIS — C3411 Malignant neoplasm of upper lobe, right bronchus or lung: Secondary | ICD-10-CM

## 2018-04-30 DIAGNOSIS — Z9221 Personal history of antineoplastic chemotherapy: Secondary | ICD-10-CM | POA: Diagnosis not present

## 2018-04-30 DIAGNOSIS — C3490 Malignant neoplasm of unspecified part of unspecified bronchus or lung: Secondary | ICD-10-CM | POA: Diagnosis present

## 2018-04-30 DIAGNOSIS — M899 Disorder of bone, unspecified: Secondary | ICD-10-CM | POA: Insufficient documentation

## 2018-04-30 DIAGNOSIS — I700 Atherosclerosis of aorta: Secondary | ICD-10-CM | POA: Insufficient documentation

## 2018-04-30 DIAGNOSIS — Z95828 Presence of other vascular implants and grafts: Secondary | ICD-10-CM

## 2018-04-30 DIAGNOSIS — Z85118 Personal history of other malignant neoplasm of bronchus and lung: Secondary | ICD-10-CM | POA: Diagnosis present

## 2018-04-30 DIAGNOSIS — Z923 Personal history of irradiation: Secondary | ICD-10-CM | POA: Diagnosis not present

## 2018-04-30 DIAGNOSIS — C3491 Malignant neoplasm of unspecified part of right bronchus or lung: Secondary | ICD-10-CM | POA: Diagnosis not present

## 2018-04-30 LAB — CBC WITH DIFFERENTIAL (CANCER CENTER ONLY)
BASOS ABS: 0 10*3/uL (ref 0.0–0.1)
BASOS PCT: 1 %
EOS PCT: 2 %
Eosinophils Absolute: 0.1 10*3/uL (ref 0.0–0.5)
HEMATOCRIT: 39.3 % (ref 34.8–46.6)
Hemoglobin: 13.1 g/dL (ref 11.6–15.9)
LYMPHS PCT: 26 %
Lymphs Abs: 1.1 10*3/uL (ref 0.9–3.3)
MCH: 31.5 pg (ref 25.1–34.0)
MCHC: 33.3 g/dL (ref 31.5–36.0)
MCV: 94.5 fL (ref 79.5–101.0)
Monocytes Absolute: 0.4 10*3/uL (ref 0.1–0.9)
Monocytes Relative: 10 %
NEUTROS ABS: 2.6 10*3/uL (ref 1.5–6.5)
Neutrophils Relative %: 61 %
PLATELETS: 215 10*3/uL (ref 145–400)
RBC: 4.15 MIL/uL (ref 3.70–5.45)
RDW: 13.7 % (ref 11.2–14.5)
WBC: 4.3 10*3/uL (ref 3.9–10.3)

## 2018-04-30 LAB — CMP (CANCER CENTER ONLY)
ALK PHOS: 35 U/L — AB (ref 38–126)
ALT: 16 U/L (ref 0–44)
ANION GAP: 8 (ref 5–15)
AST: 20 U/L (ref 15–41)
Albumin: 4.1 g/dL (ref 3.5–5.0)
BUN: 20 mg/dL (ref 8–23)
CALCIUM: 9.6 mg/dL (ref 8.9–10.3)
CO2: 27 mmol/L (ref 22–32)
Chloride: 105 mmol/L (ref 98–111)
Creatinine: 0.73 mg/dL (ref 0.44–1.00)
GFR, Est AFR Am: >60 mL/min (ref >60)
Glucose, Bld: 88 mg/dL (ref 70–99)
POTASSIUM: 4.3 mmol/L (ref 3.5–5.1)
Sodium: 140 mmol/L (ref 135–145)
Total Bilirubin: 0.3 mg/dL (ref 0.3–1.2)
Total Protein: 6.7 g/dL (ref 6.5–8.1)

## 2018-04-30 MED ORDER — SODIUM CHLORIDE 0.9 % IJ SOLN
INTRAMUSCULAR | Status: AC
  Filled 2018-04-30: qty 50

## 2018-04-30 MED ORDER — IOHEXOL 300 MG/ML  SOLN
75.0000 mL | Freq: Once | INTRAMUSCULAR | Status: AC | PRN
  Administered 2018-04-30: 75 mL via INTRAVENOUS

## 2018-04-30 MED ORDER — SODIUM CHLORIDE 0.9 % IJ SOLN
10.0000 mL | INTRAMUSCULAR | Status: DC | PRN
  Administered 2018-04-30: 10 mL via INTRAVENOUS
  Filled 2018-04-30: qty 10

## 2018-04-30 MED ORDER — HEPARIN SOD (PORK) LOCK FLUSH 100 UNIT/ML IV SOLN
500.0000 [IU] | Freq: Once | INTRAVENOUS | Status: AC
  Administered 2018-04-30: 500 [IU] via INTRAVENOUS

## 2018-04-30 MED ORDER — HEPARIN SOD (PORK) LOCK FLUSH 100 UNIT/ML IV SOLN
INTRAVENOUS | Status: AC
  Filled 2018-04-30: qty 5

## 2018-05-02 ENCOUNTER — Ambulatory Visit: Payer: Managed Care, Other (non HMO) | Admitting: Internal Medicine

## 2018-05-04 ENCOUNTER — Telehealth: Payer: Self-pay | Admitting: Medical Oncology

## 2018-05-04 NOTE — Telephone Encounter (Signed)
Her scan is ok.  Thank you

## 2018-05-04 NOTE — Telephone Encounter (Signed)
{  t traveling and wants to look at labs and ct report on mychart.  Her appt is 10/29. She is anxious to see results and "no matter which way the scan reads is fine".

## 2018-05-04 NOTE — Telephone Encounter (Signed)
Pt.notified

## 2018-05-22 ENCOUNTER — Inpatient Hospital Stay (HOSPITAL_BASED_OUTPATIENT_CLINIC_OR_DEPARTMENT_OTHER): Payer: Medicare HMO | Admitting: Internal Medicine

## 2018-05-22 ENCOUNTER — Encounter: Payer: Self-pay | Admitting: *Deleted

## 2018-05-22 ENCOUNTER — Encounter: Payer: Self-pay | Admitting: Internal Medicine

## 2018-05-22 VITALS — BP 139/94 | HR 88 | Temp 97.6°F | Resp 20 | Ht 63.0 in | Wt 139.5 lb

## 2018-05-22 DIAGNOSIS — C3490 Malignant neoplasm of unspecified part of unspecified bronchus or lung: Secondary | ICD-10-CM

## 2018-05-22 DIAGNOSIS — C770 Secondary and unspecified malignant neoplasm of lymph nodes of head, face and neck: Secondary | ICD-10-CM

## 2018-05-22 DIAGNOSIS — C7951 Secondary malignant neoplasm of bone: Secondary | ICD-10-CM

## 2018-05-22 DIAGNOSIS — Z85118 Personal history of other malignant neoplasm of bronchus and lung: Secondary | ICD-10-CM

## 2018-05-22 DIAGNOSIS — Z9221 Personal history of antineoplastic chemotherapy: Secondary | ICD-10-CM

## 2018-05-22 DIAGNOSIS — Z923 Personal history of irradiation: Secondary | ICD-10-CM

## 2018-05-22 DIAGNOSIS — C3411 Malignant neoplasm of upper lobe, right bronchus or lung: Secondary | ICD-10-CM

## 2018-05-22 NOTE — Progress Notes (Signed)
Oncology Nurse Navigator Documentation  Oncology Nurse Navigator Flowsheets 05/22/2018  Navigator Location [facility]  Navigator Encounter Type Clinic/MDC/I spoke with patient at clinic today.  According to Dr. Julien Nordmann, patient's recent scan is stable.  I did update her on the upcoming lung cancer events and gave her information.  No other barriers identified at this time.   Treatment Phase Follow-up  Barriers/Navigation Needs Education  Education Other  Interventions Education  Education Method Verbal;Written  Acuity Level 1  Time Spent with Patient 15

## 2018-05-22 NOTE — Progress Notes (Signed)
.      [place] [phone]   Fax:(336) [phone]  OFFICE PROGRESS NOTE  Patient, No Pcp Per No address on file  DIAGNOSIS: Stage IIIB (T2a, N3, M0) non-small cell lung cancer, adenocarcinoma with negative EGFR mutation and negative ALK gene translocation diagnosed in October 2016.  Biomarker Findings Microsatellite status - MS-Stable Tumor Mutational Burden - TMB-Low (5 Muts/Mb) Genomic Findings For a complete list of the genes assayed, please refer to the Appendix. ROS1 SDC4-ROS1 fusion RBM10 N543f*14 TP53 R273C 7 Disease relevant genes with no reportable alterations: EGFR, KRAS, ALK, BRAF, MET, RET, ERBB2  PRIOR THERAPY:  1) Concurrent chemoradiation with weekly carboplatin for AUC of 2 and paclitaxel 45 MG/M2 status post 6 cycles. Last dose was given 07/13/2015 with partial response. 2) Systemic chemotherapy with carboplatin for AUC of 5 and Alimta 500 MG/M2 every 3 weeks. First dose 11/02/2015. Status post 6 cycles. 3) palliative stereotactic radiotherapy to the left supraclavicular lymph node under the care of Dr. MTammi Klippel  Loss of fraction of radiotherapy scheduled for November 01, 2017  CURRENT THERAPY: Observation.  INTERVAL HISTORY: Rose LIESER89y.o. female returns to the clinic today for follow-up visit.  The patient is feeling fine today with no concerning complaints.  She denied having any chest pain, shortness of breath, cough or hemoptysis.  She denied having any fever or chills.  She has no nausea, vomiting, diarrhea or constipation.  She denied having any recent weight loss or night sweats.  She has no headache or visual changes.  The patient had repeat CT scan of the chest performed recently and she is here for evaluation and discussion of her risk her results.   MEDICAL HISTORY: Past Medical History:  Diagnosis Date  . Anxiety   . Back pain    right back lateral  . BCC (basal cell carcinoma of skin)   . Colon polyps   .  Complication of anesthesia    pt. states that she is difficult to arouse  . Complication of anesthesia    "indigestion and burping " after eating.  . Diverticulosis   . Dysrhythmia    "skip beat in early 20's"  . H/O cold sores   . History of anemia   . Lung [place] ([place]    RUL mass -being tx- radiation and chemo- remains last chemo 01-04-16  . Osteoporosis   . PONV (postoperative nausea and vomiting)   . Rectal bleeding   . right upper lobe lung mass 04/15/2015   malignant tumor found - 10'16-radiation and chemotherapy and remains with chemo at present- Dr. MEarlie Serverfollows.    ALLERGIES:  has No Known Allergies.  MEDICATIONS:  Current Outpatient Medications  Medication Sig Dispense Refill  . Biotin 5000 MCG CAPS Take 5,000 mcg by mouth daily.    . Calcium Carb-Cholecalciferol (CALCIUM 1000 + D PO) Take 2 tablets by mouth 2 (two) times daily. D3 = " 25 mcg"    . cetirizine (ZYRTEC) 10 MG tablet Take 10 mg by mouth daily.     .Marland KitchenL-LYSINE PO Take 3,000 mg by mouth daily.     .Marland Kitchenlidocaine-prilocaine (EMLA) cream Apply 1 application topically as needed. 30 g 0  . Misc Natural Products (TART CHERRY ADVANCED PO) Take 1,000 mg by mouth daily.     . Multiple Vitamins-Minerals (CENTRUM SILVER PO) Take 1 tablet by mouth daily.    . Omega-3 Fatty Acids (RA FISH OIL) 1400 MG CPDR Take 1 capsule by mouth  2 (two) times daily.     Marland Kitchen OVER THE COUNTER MEDICATION Take 1,000 mg by mouth daily. "spirolina'    . psyllium (METAMUCIL) 58.6 % powder Take 1 packet by mouth 3 (three) times daily.    . Turmeric Curcumin 500 MG CAPS Take 1 each by mouth daily.     . vitamin B-12 (CYANOCOBALAMIN) 1000 MCG tablet Take 1,000 mcg by mouth daily.     No current facility-administered medications for this visit.    Facility-Administered Medications Ordered in Other Visits  Medication Dose Route Frequency Provider Last Rate Last Dose  . sodium chloride 0.9 % injection 10 mL  10 mL Intravenous PRN Curt Bears,  MD   10 mL at 01/26/18 1040    SURGICAL HISTORY:  Past Surgical History:  Procedure Laterality Date  . COLONOSCOPY    . COLONOSCOPY W/ POLYPECTOMY    . COLONOSCOPY WITH PROPOFOL N/A 01/20/2016   Procedure: COLONOSCOPY WITH PROPOFOL;  Surgeon: Arta Silence, MD;  Location: [facility];  Service: Endoscopy;  Laterality: N/A;  . CYSTECTOMY     right breast x 2  . MEDIASTINOSCOPY N/A 05/07/2015   Procedure: MEDIASTINOSCOPY;  Surgeon: Ivin Poot, MD;  Location: [facility];  Service: Thoracic;  Laterality: N/A;  . TONSILLECTOMY    . VIDEO BRONCHOSCOPY WITH ENDOBRONCHIAL ULTRASOUND N/A 05/07/2015   Procedure: VIDEO BRONCHOSCOPY WITH ENDOBRONCHIAL ULTRASOUND;  Surgeon: Ivin Poot, MD;  Location: [facility] OR;  Service: Thoracic;  Laterality: N/A;    REVIEW OF SYSTEMS:  A comprehensive review of systems was negative.   PHYSICAL EXAMINATION: General appearance: alert, cooperative and no distress Head: Normocephalic, without obvious abnormality, atraumatic Neck: no adenopathy, no JVD, supple, symmetrical, trachea midline and thyroid not enlarged, symmetric, no tenderness/mass/nodules Lymph nodes: Cervical, supraclavicular, and axillary nodes normal. Resp: clear to auscultation bilaterally Back: symmetric, no curvature. ROM normal. No CVA tenderness. Cardio: regular rate and rhythm, S1, S2 normal, no murmur, click, rub or gallop GI: soft, non-tender; bowel sounds normal; no masses,  no organomegaly Extremities: extremities normal, atraumatic, no cyanosis or edema  ECOG PERFORMANCE STATUS: 0 - Asymptomatic  Blood pressure (!) 139/94, pulse 88, temperature 97.6 F (36.4 C), temperature source Oral, resp. rate 20, height 5' 3"  (1.6 m), weight 139 lb 8 oz (63.3 kg), SpO2 100 %.  LABORATORY DATA: Lab Results  Component Value Date   WBC 4.3 04/30/2018   HGB 13.1 04/30/2018   HCT 39.3 04/30/2018   MCV 94.5 04/30/2018   PLT 215 04/30/2018      Chemistry      Component Value Date/Time   NA  140 04/30/2018 1200   NA 139 03/20/2017 1038   K 4.3 04/30/2018 1200   K 4.3 03/20/2017 1038   CL 105 04/30/2018 1200   CO2 27 04/30/2018 1200   CO2 27 03/20/2017 1038   BUN 20 04/30/2018 1200   BUN 15.4 03/20/2017 1038   CREATININE 0.73 04/30/2018 1200   CREATININE 0.7 03/20/2017 1038      Component Value Date/Time   CALCIUM 9.6 04/30/2018 1200   CALCIUM 9.8 03/20/2017 1038   ALKPHOS 35 (L) 04/30/2018 1200   ALKPHOS 37 (L) 03/20/2017 1038   AST 20 04/30/2018 1200   AST 25 03/20/2017 1038   ALT 16 04/30/2018 1200   ALT 22 03/20/2017 1038   BILITOT 0.3 04/30/2018 1200   BILITOT 0.42 03/20/2017 1038       RADIOGRAPHIC STUDIES: Ct Chest W Contrast  Result Date: 05/01/2018 CLINICAL DATA:  Right-sided lung  cancer diagnosed in 2016 with lymph node metastasis earlier this year. Non-small-cell. Status post radiation therapy. EXAM: CT CHEST WITH CONTRAST TECHNIQUE: Multidetector CT imaging of the chest was performed during intravenous contrast administration. CONTRAST:  65m OMNIPAQUE IOHEXOL 300 MG/ML  SOLN COMPARISON:  01/26/2018 FINDINGS: Cardiovascular: A left-sided Port-A-Cath which terminates at the high right atrium. Aortic atherosclerosis. Normal heart size, without pericardial effusion. No central pulmonary embolism, on this non-dedicated study. Mediastinum/Nodes: Small low left jugular/supraclavicular nodes are unchanged and not pathologic by size criteria. No mediastinal or hilar adenopathy. Lungs/Pleura: No pleural fluid. Similar appearance of consolidation and volume loss involving the medial right upper lobe, presumably radiation induced. There is also similar mild volume loss and consolidation involving the superior most aspect of the right lower lobe. Mild centrilobular emphysema. Upper Abdomen: Tiny well-circumscribed low-density liver lesions are likely cysts. Normal imaged portions of the spleen, stomach, pancreas, gallbladder, adrenal glands, kidneys. Musculoskeletal: Mild  osteopenia. Sclerosis involving the left side of the T2 vertebral body is similar. There is also incompletely imaged sclerosis involving the left acromion. Posterior right rib defects could be posttraumatic or postsurgical. IMPRESSION: 1. No significant change compared to 01/26/2018. 2. Similar sclerotic lesions within T2 and the left scapula. 3. Similar radiation induced consolidation and architectural distortion within the right lung. 4.  Aortic Atherosclerosis (ICD10-I70.0). Electronically Signed   By: KAbigail MiyamotoM.D.   On: 05/01/2018 09:14   ASSESSMENT AND PLAN:  This is a very pleasant 63years old white female with a stage IIIB non-small cell lung cancer, adenocarcinoma with negative EGFR, ALK mutations diagnosed in October 2016.  Molecular studies showed positive ROS 1 She is status post course of concurrent chemoradiation followed by consolidation chemotherapy with carboplatin and Alimta. The patient has been in observation for close to 3 years. She underwent palliative radiotherapy to the left supraclavicular lymph nodes with improvement of her disease. She is currently on observation again and she is feeling fine. Repeat CT scan of the chest showed no concerning findings for disease progression. I discussed the scan results with the patient and recommended for her to continue on observation with repeat CT scan of the chest in 6 months. She was advised to call immediately if she has any concerning symptoms in the interval. The patient voices understanding of current disease status and treatment options and is in agreement with the current care plan. All questions were answered. The patient knows to call the clinic with any problems, questions or concerns. We can certainly see the patient much sooner if necessary. I spent 10 minutes counseling the patient face to face. The total time spent in the appointment was 15 minutes.  Disclaimer: This note was dictated with voice recognition software.  Similar sounding words can inadvertently be transcribed and may not be corrected upon review.

## 2018-05-22 NOTE — Addendum Note (Signed)
Addended by: Ardeen Garland on: 05/22/2018 04:21 PM   Modules accepted: Orders

## 2018-05-24 ENCOUNTER — Telehealth: Payer: Self-pay | Admitting: Internal Medicine

## 2018-05-24 NOTE — Telephone Encounter (Signed)
Scheduled appt per 10/29 los - sent reminder letter in the mail with appt date and time

## 2018-05-31 DIAGNOSIS — R69 Illness, unspecified: Secondary | ICD-10-CM | POA: Diagnosis not present

## 2018-06-04 DIAGNOSIS — Z23 Encounter for immunization: Secondary | ICD-10-CM | POA: Diagnosis not present

## 2018-06-20 DIAGNOSIS — Z23 Encounter for immunization: Secondary | ICD-10-CM | POA: Diagnosis not present

## 2018-06-25 ENCOUNTER — Inpatient Hospital Stay: Payer: Medicare HMO | Attending: Internal Medicine

## 2018-06-25 ENCOUNTER — Telehealth: Payer: Self-pay

## 2018-06-25 DIAGNOSIS — Z95828 Presence of other vascular implants and grafts: Secondary | ICD-10-CM

## 2018-06-25 DIAGNOSIS — Z452 Encounter for adjustment and management of vascular access device: Secondary | ICD-10-CM | POA: Diagnosis present

## 2018-06-25 DIAGNOSIS — Z85118 Personal history of other malignant neoplasm of bronchus and lung: Secondary | ICD-10-CM | POA: Diagnosis not present

## 2018-06-25 DIAGNOSIS — C3411 Malignant neoplasm of upper lobe, right bronchus or lung: Secondary | ICD-10-CM

## 2018-06-25 MED ORDER — HEPARIN SOD (PORK) LOCK FLUSH 100 UNIT/ML IV SOLN
500.0000 [IU] | Freq: Once | INTRAVENOUS | Status: AC | PRN
  Administered 2018-06-25: 500 [IU] via INTRAVENOUS
  Filled 2018-06-25: qty 5

## 2018-06-25 MED ORDER — SODIUM CHLORIDE 0.9% FLUSH
10.0000 mL | Freq: Once | INTRAVENOUS | Status: AC
  Administered 2018-06-25: 10 mL via INTRAVENOUS
  Filled 2018-06-25: qty 10

## 2018-06-25 NOTE — Telephone Encounter (Signed)
Per patient request for additional flush appointment. Due every 8 week . Printed calender of these appointment. Per 12/2 walk in

## 2018-07-02 DIAGNOSIS — Z79899 Other long term (current) drug therapy: Secondary | ICD-10-CM | POA: Diagnosis not present

## 2018-07-02 DIAGNOSIS — M810 Age-related osteoporosis without current pathological fracture: Secondary | ICD-10-CM | POA: Diagnosis not present

## 2018-07-03 DIAGNOSIS — H40013 Open angle with borderline findings, low risk, bilateral: Secondary | ICD-10-CM | POA: Diagnosis not present

## 2018-07-03 DIAGNOSIS — H524 Presbyopia: Secondary | ICD-10-CM | POA: Diagnosis not present

## 2018-07-06 DIAGNOSIS — M810 Age-related osteoporosis without current pathological fracture: Secondary | ICD-10-CM | POA: Diagnosis not present

## 2018-07-16 ENCOUNTER — Telehealth: Payer: Self-pay | Admitting: *Deleted

## 2018-07-16 NOTE — Telephone Encounter (Signed)
Oncology Nurse Navigator Documentation  Oncology Nurse Navigator Flowsheets 07/16/2018  Navigator Location [facility]  Navigator Encounter Type Telephone/I received information from Dr. Julien Nordmann that patient needs assistance to help get foundation one test covered with insurance.  I faxed requested information and mailed it to [facility].  I called Ms. Wik to update but was unable to reach her.  I did leave vm message with the above information and my name to call if needed.   Telephone Outgoing Call  Treatment Phase Follow-up  Barriers/Navigation Needs Education;Coordination of Care  Education Other  Interventions Coordination of Care;Education  Coordination of Care Other  Education Method Verbal  Acuity Level 2  Time Spent with Patient 64

## 2018-07-24 DIAGNOSIS — R69 Illness, unspecified: Secondary | ICD-10-CM | POA: Diagnosis not present

## 2018-08-20 ENCOUNTER — Inpatient Hospital Stay: Payer: Medicare HMO | Attending: Internal Medicine

## 2018-08-20 DIAGNOSIS — Z452 Encounter for adjustment and management of vascular access device: Secondary | ICD-10-CM | POA: Diagnosis not present

## 2018-08-20 DIAGNOSIS — C770 Secondary and unspecified malignant neoplasm of lymph nodes of head, face and neck: Secondary | ICD-10-CM | POA: Diagnosis not present

## 2018-08-20 DIAGNOSIS — C3411 Malignant neoplasm of upper lobe, right bronchus or lung: Secondary | ICD-10-CM

## 2018-08-20 DIAGNOSIS — Z95828 Presence of other vascular implants and grafts: Secondary | ICD-10-CM | POA: Insufficient documentation

## 2018-08-20 MED ORDER — HEPARIN SOD (PORK) LOCK FLUSH 100 UNIT/ML IV SOLN
500.0000 [IU] | Freq: Once | INTRAVENOUS | Status: AC
  Administered 2018-08-20: 500 [IU]
  Filled 2018-08-20: qty 5

## 2018-08-20 MED ORDER — SODIUM CHLORIDE 0.9% FLUSH
10.0000 mL | Freq: Once | INTRAVENOUS | Status: AC
  Administered 2018-08-20: 10 mL
  Filled 2018-08-20: qty 10

## 2018-10-15 ENCOUNTER — Inpatient Hospital Stay: Payer: Medicare HMO | Attending: Internal Medicine

## 2018-10-15 ENCOUNTER — Other Ambulatory Visit: Payer: Self-pay

## 2018-10-15 DIAGNOSIS — Z923 Personal history of irradiation: Secondary | ICD-10-CM | POA: Insufficient documentation

## 2018-10-15 DIAGNOSIS — Z9221 Personal history of antineoplastic chemotherapy: Secondary | ICD-10-CM | POA: Diagnosis not present

## 2018-10-15 DIAGNOSIS — Z85118 Personal history of other malignant neoplasm of bronchus and lung: Secondary | ICD-10-CM | POA: Insufficient documentation

## 2018-10-15 DIAGNOSIS — Z452 Encounter for adjustment and management of vascular access device: Secondary | ICD-10-CM | POA: Diagnosis not present

## 2018-10-15 DIAGNOSIS — Z95828 Presence of other vascular implants and grafts: Secondary | ICD-10-CM

## 2018-10-15 MED ORDER — SODIUM CHLORIDE 0.9% FLUSH
10.0000 mL | INTRAVENOUS | Status: DC | PRN
  Administered 2018-10-15: 10 mL via INTRAVENOUS
  Filled 2018-10-15: qty 10

## 2018-10-15 MED ORDER — HEPARIN SOD (PORK) LOCK FLUSH 100 UNIT/ML IV SOLN
500.0000 [IU] | Freq: Once | INTRAVENOUS | Status: AC
  Administered 2018-10-15: 500 [IU] via INTRAVENOUS
  Filled 2018-10-15: qty 5

## 2018-11-06 ENCOUNTER — Telehealth: Payer: Self-pay | Admitting: Internal Medicine

## 2018-11-06 NOTE — Telephone Encounter (Signed)
TRIED to reach

## 2018-11-16 ENCOUNTER — Ambulatory Visit (HOSPITAL_COMMUNITY): Admission: RE | Admit: 2018-11-16 | Payer: Medicare HMO | Source: Ambulatory Visit

## 2018-11-16 ENCOUNTER — Inpatient Hospital Stay: Payer: Medicare HMO

## 2018-11-16 ENCOUNTER — Other Ambulatory Visit: Payer: Self-pay

## 2018-11-16 ENCOUNTER — Ambulatory Visit (HOSPITAL_COMMUNITY)
Admission: RE | Admit: 2018-11-16 | Discharge: 2018-11-16 | Disposition: A | Discharge status: Home / Self Care | Payer: Medicare HMO | Source: Ambulatory Visit | Attending: Internal Medicine | Admitting: Internal Medicine

## 2018-11-16 ENCOUNTER — Inpatient Hospital Stay: Payer: Medicare HMO | Attending: Internal Medicine

## 2018-11-16 DIAGNOSIS — Z95828 Presence of other vascular implants and grafts: Secondary | ICD-10-CM

## 2018-11-16 DIAGNOSIS — C3490 Malignant neoplasm of unspecified part of unspecified bronchus or lung: Secondary | ICD-10-CM | POA: Insufficient documentation

## 2018-11-16 DIAGNOSIS — Z923 Personal history of irradiation: Secondary | ICD-10-CM | POA: Diagnosis not present

## 2018-11-16 DIAGNOSIS — C801 Malignant (primary) neoplasm, unspecified: Secondary | ICD-10-CM | POA: Diagnosis not present

## 2018-11-16 DIAGNOSIS — C7951 Secondary malignant neoplasm of bone: Secondary | ICD-10-CM | POA: Insufficient documentation

## 2018-11-16 DIAGNOSIS — C3411 Malignant neoplasm of upper lobe, right bronchus or lung: Secondary | ICD-10-CM | POA: Insufficient documentation

## 2018-11-16 DIAGNOSIS — C7800 Secondary malignant neoplasm of unspecified lung: Secondary | ICD-10-CM | POA: Diagnosis not present

## 2018-11-16 DIAGNOSIS — C770 Secondary and unspecified malignant neoplasm of lymph nodes of head, face and neck: Secondary | ICD-10-CM | POA: Diagnosis not present

## 2018-11-16 DIAGNOSIS — Z9221 Personal history of antineoplastic chemotherapy: Secondary | ICD-10-CM | POA: Diagnosis not present

## 2018-11-16 LAB — CMP (CANCER CENTER ONLY)
ALT: 20 U/L (ref 0–44)
AST: 24 U/L (ref 15–41)
Albumin: 3.9 g/dL (ref 3.5–5.0)
Alkaline Phosphatase: 39 U/L (ref 38–126)
Anion gap: 11 (ref 5–15)
BUN: 17 mg/dL (ref 8–23)
CO2: 25 mmol/L (ref 22–32)
Calcium: 9.6 mg/dL (ref 8.9–10.3)
Chloride: 105 mmol/L (ref 98–111)
Creatinine: 0.76 mg/dL (ref 0.44–1.00)
GFR, Est AFR Am: >60 mL/min (ref >60)
GFR, Estimated: >60 mL/min (ref >60)
Glucose, Bld: 81 mg/dL (ref 70–99)
Potassium: 4.2 mmol/L (ref 3.5–5.1)
Sodium: 141 mmol/L (ref 135–145)
Total Bilirubin: 0.3 mg/dL (ref 0.3–1.2)
Total Protein: 6.6 g/dL (ref 6.5–8.1)

## 2018-11-16 LAB — CBC WITH DIFFERENTIAL (CANCER CENTER ONLY)
Abs Immature Granulocytes: 0 10*3/uL (ref 0.00–0.07)
Basophils Absolute: 0 10*3/uL (ref 0.0–0.1)
Basophils Relative: 1 %
Eosinophils Absolute: 0.1 10*3/uL (ref 0.0–0.5)
Eosinophils Relative: 2 %
HCT: 39.5 % (ref 36.0–46.0)
Hemoglobin: 13.4 g/dL (ref 12.0–15.0)
Immature Granulocytes: 0 %
Lymphocytes Relative: 40 %
Lymphs Abs: 1.7 10*3/uL (ref 0.7–4.0)
MCH: 31.5 pg (ref 26.0–34.0)
MCHC: 33.9 g/dL (ref 30.0–36.0)
MCV: 92.7 fL (ref 80.0–100.0)
Monocytes Absolute: 0.5 10*3/uL (ref 0.1–1.0)
Monocytes Relative: 11 %
Neutro Abs: 1.9 10*3/uL (ref 1.7–7.7)
Neutrophils Relative %: 46 %
Platelet Count: 244 10*3/uL (ref 150–400)
RBC: 4.26 MIL/uL (ref 3.87–5.11)
RDW: 13.2 % (ref 11.5–15.5)
WBC Count: 4.1 10*3/uL (ref 4.0–10.5)
nRBC: 0 % (ref 0.0–0.2)

## 2018-11-16 MED ORDER — HEPARIN SOD (PORK) LOCK FLUSH 100 UNIT/ML IV SOLN
500.0000 [IU] | Freq: Once | INTRAVENOUS | Status: AC
  Administered 2018-11-16: 15:00:00 500 [IU]

## 2018-11-16 MED ORDER — SODIUM CHLORIDE 0.9% FLUSH
10.0000 mL | Freq: Once | INTRAVENOUS | Status: AC
  Administered 2018-11-16: 10 mL
  Filled 2018-11-16: qty 10

## 2018-11-16 MED ORDER — IOHEXOL 300 MG/ML  SOLN
75.0000 mL | Freq: Once | INTRAMUSCULAR | Status: AC | PRN
  Administered 2018-11-16: 15:00:00 75 mL via INTRAVENOUS

## 2018-11-19 ENCOUNTER — Inpatient Hospital Stay (HOSPITAL_BASED_OUTPATIENT_CLINIC_OR_DEPARTMENT_OTHER): Payer: Medicare HMO | Admitting: Internal Medicine

## 2018-11-19 ENCOUNTER — Encounter: Payer: Self-pay | Admitting: Internal Medicine

## 2018-11-19 DIAGNOSIS — C3490 Malignant neoplasm of unspecified part of unspecified bronchus or lung: Secondary | ICD-10-CM

## 2018-11-19 DIAGNOSIS — C7951 Secondary malignant neoplasm of bone: Secondary | ICD-10-CM

## 2018-11-19 DIAGNOSIS — C3411 Malignant neoplasm of upper lobe, right bronchus or lung: Secondary | ICD-10-CM | POA: Diagnosis not present

## 2018-11-19 DIAGNOSIS — C770 Secondary and unspecified malignant neoplasm of lymph nodes of head, face and neck: Secondary | ICD-10-CM | POA: Diagnosis not present

## 2018-11-19 NOTE — Progress Notes (Signed)
[place] [phone]   [phone]) [phone]  PROGRESS NOTE FOR TELEMEDICINE VISITS  Patient, No Pcp Per No address on file  I connected with@ on 11/19/18 at  3:15 PM EDT by telephone visit and verified that I am speaking with the correct person using two identifiers.   I discussed the limitations, risks, security and privacy concerns of performing an evaluation and management service by telemedicine and the availability of in-person appointments. I also discussed with the patient that there may be a patient responsible charge related to this service. The patient expressed understanding and agreed to proceed.  Other persons participating in the visit and their role in the encounter: None  Patient's location: Home Provider's location: [place]  DIAGNOSIS: Stage IIIB (T2a, N3, M0) non-small cell lung cancer, adenocarcinoma with negative EGFR mutation and negative ALK gene translocation diagnosed in October 2016.  Biomarker Findings Microsatellite status - MS-Stable Tumor Mutational Burden - TMB-Low (5 Muts/Mb) Genomic Findings For a complete list of the genes assayed, please refer to the Appendix. ROS1 SDC4-ROS1 fusion RBM10 N553f*14 TP53 R273C 7 Disease relevant genes with no reportable alterations: EGFR, KRAS, ALK, BRAF, MET, RET, ERBB2   PRIOR THERAPY:  1) Concurrent chemoradiation with weekly carboplatin for AUC of 2 and paclitaxel 45 MG/M2 status post 6 cycles. Last dose was given 07/13/2015 with partial response. 2) Systemic chemotherapy with carboplatin for AUC of 5 and Alimta 500 MG/M2 every 3 weeks. First dose 11/02/2015. Status post 6 cycles. 3) palliative stereotactic radiotherapy to the left supraclavicular lymph node under the care of Dr. MTammi Klippel  Loss of fraction of radiotherapy scheduled for November 01, 2017  CURRENT THERAPY: Observation.  INTERVAL HISTORY: Rose [id number].o. female has a telephone virtual visit  with me today for evaluation and discussion of her scan results.  The patient is feeling fine today with no concerning complaints except for mild pain in the right upper back.  She denied having any recent weight loss or night sweats.  She has no nausea, vomiting.  She denied having any headache or visual changes.  She has no fever or chills.  The patient has been on observation for the last 6 months.  She had repeat CT scan of the chest performed recently and she is having the visit for evaluation and discussion of her scan results.  MEDICAL HISTORY: Past Medical History:  Diagnosis Date   Anxiety    Back pain    right back lateral   BCC (basal cell carcinoma of skin)    Colon polyps    Complication of anesthesia    pt. states that she is difficult to arouse   Complication of anesthesia    "indigestion and burping " after eating.   Diverticulosis    Dysrhythmia    "skip beat in early 20's"   H/O cold sores    History of anemia    Lung cancer (HCC)    RUL mass -being tx- radiation and chemo- remains last chemo 01-04-16   Osteoporosis    PONV (postoperative nausea and vomiting)    Rectal bleeding    right upper lobe lung mass 04/15/2015   malignant tumor found - 10'16-radiation and chemotherapy and remains with chemo at present- Dr. MEarlie Serverfollows.    ALLERGIES:  has No Known Allergies.  MEDICATIONS:  Current Outpatient Medications  Medication Sig Dispense Refill   Biotin 5000 MCG CAPS Take 10,000 mcg by mouth daily.      Calcium Carb-Cholecalciferol (  CALCIUM 1000 + D PO) Take 2 tablets by mouth 2 (two) times daily. D3 = " 25 mcg"     cetirizine (ZYRTEC) 10 MG tablet Take 10 mg by mouth daily.      Flaxseed, Linseed, (FLAXSEED OIL) 1000 MG CAPS Take 1 capsule by mouth every evening.     L-LYSINE PO Take 2,000 mg by mouth daily.      lidocaine-prilocaine (EMLA) cream Apply 1 application topically as needed. 30 g 0   Omega-3 Fatty Acids (RA FISH OIL) 1400 MG  CPDR Take 1 capsule by mouth 2 (two) times daily.      psyllium (METAMUCIL) 58.6 % packet Take 1 packet by mouth daily. "takes 2 tbsp"     Turmeric Curcumin 500 MG CAPS Take 1 each by mouth daily.      vitamin B-12 (CYANOCOBALAMIN) 1000 MCG tablet Take 1,000 mcg by mouth daily.     No current facility-administered medications for this visit.    Facility-Administered Medications Ordered in Other Visits  Medication Dose Route Frequency Provider Last Rate Last Dose   sodium chloride 0.9 % injection 10 mL  10 mL Intravenous PRN Curt Bears, MD   10 mL at 01/26/18 1040    SURGICAL HISTORY:  Past Surgical History:  Procedure Laterality Date   COLONOSCOPY     COLONOSCOPY W/ POLYPECTOMY     COLONOSCOPY WITH PROPOFOL N/A 01/20/2016   Procedure: COLONOSCOPY WITH PROPOFOL;  Surgeon: Arta Silence, MD;  Location: [facility];  Service: Endoscopy;  Laterality: N/A;   CYSTECTOMY     right breast x 2   MEDIASTINOSCOPY N/A 05/07/2015   Procedure: MEDIASTINOSCOPY;  Surgeon: Ivin Poot, MD;  Location: [facility];  Service: Thoracic;  Laterality: N/A;   TONSILLECTOMY     VIDEO BRONCHOSCOPY WITH ENDOBRONCHIAL ULTRASOUND N/A 05/07/2015   Procedure: VIDEO BRONCHOSCOPY WITH ENDOBRONCHIAL ULTRASOUND;  Surgeon: Ivin Poot, MD;  Location: [facility];  Service: Thoracic;  Laterality: N/A;    REVIEW OF SYSTEMS:  A comprehensive review of systems was negative except for: Musculoskeletal: positive for back pain   LABORATORY DATA: Lab Results  Component Value Date   WBC 4.1 11/16/2018   HGB 13.4 11/16/2018   HCT 39.5 11/16/2018   MCV 92.7 11/16/2018   PLT 244 11/16/2018      Chemistry      Component Value Date/Time   NA 141 11/16/2018 1355   NA 139 03/20/2017 1038   K 4.2 11/16/2018 1355   K 4.3 03/20/2017 1038   CL 105 11/16/2018 1355   CO2 25 11/16/2018 1355   CO2 27 03/20/2017 1038   BUN 17 11/16/2018 1355   BUN 15.4 03/20/2017 1038   CREATININE 0.76 11/16/2018 1355    CREATININE 0.7 03/20/2017 1038      Component Value Date/Time   CALCIUM 9.6 11/16/2018 1355   CALCIUM 9.8 03/20/2017 1038   ALKPHOS 39 11/16/2018 1355   ALKPHOS 37 (L) 03/20/2017 1038   AST 24 11/16/2018 1355   AST 25 03/20/2017 1038   ALT 20 11/16/2018 1355   ALT 22 03/20/2017 1038   BILITOT 0.3 11/16/2018 1355   BILITOT 0.42 03/20/2017 1038       RADIOGRAPHIC STUDIES: Ct Chest W Contrast  Result Date: 11/18/2018 CLINICAL DATA:  63 year old female with history of metastatic right-sided non-small cell lung cancer diagnosed in 2016 and again in 2019, status post chemotherapy and radiation therapy now completed. EXAM: CT CHEST WITH CONTRAST TECHNIQUE: Multidetector CT imaging of the chest was performed  during intravenous contrast administration. CONTRAST:  62m OMNIPAQUE IOHEXOL 300 MG/ML  SOLN COMPARISON:  Chest CT 04/30/2018. FINDINGS: Cardiovascular: Heart size is normal. There is no significant pericardial fluid, thickening or pericardial calcification. Aortic atherosclerosis. No definite coronary artery calcifications. Left internal jugular single-lumen porta cath with tip terminating at the superior cavoatrial junction. Mediastinum/Nodes: No pathologically enlarged mediastinal or hilar lymph nodes. Esophagus is unremarkable in appearance. No axillary lymphadenopathy. Lungs/Pleura: Previously noted area of chronic architectural distortion and volume loss in the medial aspect of the right lung which corresponds with complete atelectasis and chronic postradiation mass-like fibrosis of the right upper lobe. This is similar to prior examinations. Compensatory hyperexpansion of the right middle and lower lobes is again noted. No new suspicious appearing pulmonary nodules or masses are noted. No acute consolidative airspace disease. No pleural effusions. Upper Abdomen: Subcentimeter low-attenuation lesions in the liver, too small to characterize, but statistically likely to represent tiny cysts.  Musculoskeletal: Large sclerotic lesion in the left side of the T2 vertebra involving both the body, pedicle and facet, similar to the prior examination. Multiple nondisplaced fractures of the posterior right ribs 4, 5, 6 and 7 with healing fractures, similar to prior examinations. IMPRESSION: 1. Stable post treatment related changes of postradiation mass-like fibrosis in the right upper lobe, with no definite findings to suggest local recurrence of disease or metastatic disease in the lungs. 2. Unchanged sclerotic lesion in T2 vertebra, presumably a treated metastatic lesion. 3. Aortic atherosclerosis. 4. Additional incidental findings, as above. Aortic Atherosclerosis (ICD10-I70.0). Electronically Signed   By: DVinnie LangtonM.D.   On: 11/18/2018 09:23    ASSESSMENT AND PLAN:  This is a very pleasant 63years old white female with a stage IIIB non-small cell lung cancer, adenocarcinoma with negative EGFR, ALK mutations diagnosed in October 2016.  Molecular studies showed positive ROS 1 She is status post course of concurrent chemoradiation followed by consolidation chemotherapy with carboplatin and Alimta. The patient has been in observation for close to 3 years. She underwent palliative radiotherapy to the left supraclavicular lymph nodes with improvement of her disease. She continues on observation and she is feeling fine. She had repeat CT scan of the chest performed recently.  I personally and independently reviewed the scan and discussed the results with the patient today. Her Scan showed no concerning findings for disease recurrence or progression. I recommended for her to continue on observation with repeat CT scan of the chest in 6 months time. The patient requested removal of Port-A-Cath and she would prefer to IR for this procedure. She was advised that immediately she has any concerning symptoms in the interval. I discussed the assessment and treatment plan with the patient. The patient  was provided an opportunity to ask questions and all were answered. The patient agreed with the plan and demonstrated an understanding of the instructions.   The patient was advised to call back or seek an in-person evaluation if the symptoms worsen or if the condition fails to improve as anticipated.  I provided 11 minutes of non face-to-face telephone visit time during this encounter, and > 50% was spent counseling as documented under my assessment & plan.  MEilleen Kempf MD 11/19/2018 3:38 PM  Disclaimer: This note was dictated with voice recognition software. Similar sounding words can inadvertently be transcribed and may not be corrected upon review.

## 2018-11-20 ENCOUNTER — Telehealth: Payer: Self-pay | Admitting: Internal Medicine

## 2018-11-20 NOTE — Telephone Encounter (Signed)
Tried to reach regarding schedule °

## 2018-12-11 ENCOUNTER — Other Ambulatory Visit: Payer: Self-pay | Admitting: Radiology

## 2018-12-12 ENCOUNTER — Encounter (HOSPITAL_COMMUNITY): Payer: Self-pay

## 2018-12-12 ENCOUNTER — Ambulatory Visit (HOSPITAL_COMMUNITY)
Admission: RE | Admit: 2018-12-12 | Discharge: 2018-12-12 | Disposition: A | Discharge status: Home / Self Care | Payer: Medicare HMO | Source: Ambulatory Visit | Attending: Internal Medicine | Admitting: Internal Medicine

## 2018-12-12 ENCOUNTER — Other Ambulatory Visit: Payer: Self-pay

## 2018-12-12 DIAGNOSIS — C3490 Malignant neoplasm of unspecified part of unspecified bronchus or lung: Secondary | ICD-10-CM | POA: Diagnosis not present

## 2018-12-12 DIAGNOSIS — Z08 Encounter for follow-up examination after completed treatment for malignant neoplasm: Secondary | ICD-10-CM | POA: Diagnosis not present

## 2018-12-12 DIAGNOSIS — Z452 Encounter for adjustment and management of vascular access device: Secondary | ICD-10-CM | POA: Insufficient documentation

## 2018-12-12 DIAGNOSIS — C3491 Malignant neoplasm of unspecified part of right bronchus or lung: Secondary | ICD-10-CM | POA: Diagnosis not present

## 2018-12-12 HISTORY — PX: IR REMOVAL TUN ACCESS W/ PORT W/O FL MOD SED: IMG2290

## 2018-12-12 LAB — CBC
HCT: 41.8 % (ref 36.0–46.0)
Hemoglobin: 13.6 g/dL (ref 12.0–15.0)
MCH: 31.3 pg (ref 26.0–34.0)
MCHC: 32.5 g/dL (ref 30.0–36.0)
MCV: 96.3 fL (ref 80.0–100.0)
Platelets: 250 10*3/uL (ref 150–400)
RBC: 4.34 MIL/uL (ref 3.87–5.11)
RDW: 13.3 % (ref 11.5–15.5)
WBC: 3.7 10*3/uL — ABNORMAL LOW (ref 4.0–10.5)
nRBC: 0 % (ref 0.0–0.2)

## 2018-12-12 LAB — PROTIME-INR
INR: 0.9 (ref 0.8–1.2)
Prothrombin Time: 12.4 seconds (ref 11.4–15.2)

## 2018-12-12 MED ORDER — CEFAZOLIN SODIUM-DEXTROSE 2-4 GM/100ML-% IV SOLN
2.0000 g | Freq: Once | INTRAVENOUS | Status: AC
  Administered 2018-12-12: 14:00:00 2 g via INTRAVENOUS

## 2018-12-12 MED ORDER — CEFAZOLIN SODIUM-DEXTROSE 2-4 GM/100ML-% IV SOLN
INTRAVENOUS | Status: AC
  Administered 2018-12-12: 2 g via INTRAVENOUS
  Filled 2018-12-12: qty 100

## 2018-12-12 MED ORDER — LIDOCAINE-EPINEPHRINE 1 %-1:100000 IJ SOLN
INTRAMUSCULAR | Status: AC
  Filled 2018-12-12: qty 1

## 2018-12-12 NOTE — Discharge Instructions (Signed)
Implanted Port Removal, Care After This sheet gives you information about how to care for yourself after your procedure. Your health care provider may also give you more specific instructions. If you have problems or questions, contact your health care provider. What can I expect after the procedure? After the procedure, it is common to have:  Soreness or pain near your incision.  Some swelling or bruising near your incision. Follow these instructions at home: Medicines  Take over-the-counter and prescription medicines only as told by your health care provider.  If you were prescribed an antibiotic medicine, take it as told by your health care provider. Do not stop taking the antibiotic even if you start to feel better. Bathing  Do not take baths, swim, or use a hot tub until your health care provider approves. Ask your health care provider if you can take showers. You may only be allowed to take sponge baths.  You may shower tomorrow. Incision care   Follow instructions from your health care provider about how to take care of your incision. Make sure you: ? Wash your hands with soap and water before you change your bandage (dressing). If soap and water are not available, use hand sanitizer. ? Change your dressing as told by your health care provider.  You may remove your dressing tomorrow. ? Keep your dressing dry. ? Leave stitches (sutures), skin glue, or adhesive strips in place. These skin closures may need to stay in place for 2 weeks or longer. If adhesive strip edges start to loosen and curl up, you may trim the loose edges. Do not remove adhesive strips completely unless your health care provider tells you to do that.  Check your incision area every day for signs of infection. Check for: ? More redness, swelling, or pain. ? More fluid or blood. ? Warmth. ? Pus or a bad smell. Driving   Do not drive for 24 hours if you were given a medicine to help you relax (sedative) during  your procedure.  If you did not receive a sedative, ask your health care provider when it is safe to drive. Activity  Return to your normal activities as told by your health care provider. Ask your health care provider what activities are safe for you.  Do not lift anything that is heavier than 10 lb (4.5 kg), or the limit that you are told, until your health care provider says that it is safe.  Do not do activities that involve lifting your arms over your head. General instructions  Do not use any products that contain nicotine or tobacco, such as cigarettes and e-cigarettes. These can delay healing. If you need help quitting, ask your health care provider.  Keep all follow-up visits as told by your health care provider. This is important. Contact a health care provider if:  You have more redness, swelling, or pain around your incision.  You have more fluid or blood coming from your incision.  Your incision feels warm to the touch.  You have pus or a bad smell coming from your incision.  You have pain that is not relieved by your pain medicine. Get help right away if you have:  A fever or chills.  Chest pain.  Difficulty breathing. Summary  After the procedure, it is common to have pain, soreness, swelling, or bruising near your incision.  If you were prescribed an antibiotic medicine, take it as told by your health care provider. Do not stop taking the antibiotic even if  you start to feel better.  Do not drive for 24 hours if you were given a sedative during your procedure.  Return to your normal activities as told by your health care provider. Ask your health care provider what activities are safe for you. This information is not intended to replace advice given to you by your health care provider. Make sure you discuss any questions you have with your health care provider. Document Released: 06/22/2015 Document Revised: 08/24/2017 Document Reviewed: 08/24/2017 Elsevier  Interactive Patient Education  2019 [employer].

## 2018-12-12 NOTE — Progress Notes (Signed)
Patient ID: Rose Figueroa, female   DOB: June 15, 1956, 63 y.o.   MRN: [id number] Patient presents to IR department today for Port-A-Cath removal.  She has a history of metastatic right lung adenocarcinoma, initially diagnosed in 2016, status post chemoradiation.  She is no longer receiving treatment and recent imaging shows no disease recurrence.  She requests no IV conscious sedation for port removal.  Details/risks of procedure, including but not limited to, internal bleeding, infection, injury to adjacent structures discussed with patient with her understanding and consent. LABS PENDING.

## 2018-12-25 DIAGNOSIS — M810 Age-related osteoporosis without current pathological fracture: Secondary | ICD-10-CM | POA: Diagnosis not present

## 2019-01-07 DIAGNOSIS — M810 Age-related osteoporosis without current pathological fracture: Secondary | ICD-10-CM | POA: Diagnosis not present

## 2019-01-29 ENCOUNTER — Other Ambulatory Visit: Payer: Self-pay | Admitting: Family Medicine

## 2019-01-29 ENCOUNTER — Ambulatory Visit
Admission: RE | Admit: 2019-01-29 | Discharge: 2019-01-29 | Disposition: A | Discharge status: Home / Self Care | Payer: Medicare HMO | Source: Ambulatory Visit | Attending: Family Medicine | Admitting: Family Medicine

## 2019-01-29 DIAGNOSIS — Z1231 Encounter for screening mammogram for malignant neoplasm of breast: Secondary | ICD-10-CM

## 2019-04-30 DIAGNOSIS — Z23 Encounter for immunization: Secondary | ICD-10-CM | POA: Diagnosis not present

## 2019-05-13 DIAGNOSIS — C44319 Basal cell carcinoma of skin of other parts of face: Secondary | ICD-10-CM | POA: Diagnosis not present

## 2019-05-13 DIAGNOSIS — L570 Actinic keratosis: Secondary | ICD-10-CM | POA: Diagnosis not present

## 2019-05-13 DIAGNOSIS — D045 Carcinoma in situ of skin of trunk: Secondary | ICD-10-CM | POA: Diagnosis not present

## 2019-05-13 DIAGNOSIS — C44612 Basal cell carcinoma of skin of right upper limb, including shoulder: Secondary | ICD-10-CM | POA: Diagnosis not present

## 2019-05-13 DIAGNOSIS — L821 Other seborrheic keratosis: Secondary | ICD-10-CM | POA: Diagnosis not present

## 2019-05-17 ENCOUNTER — Ambulatory Visit (HOSPITAL_COMMUNITY)
Admission: RE | Admit: 2019-05-17 | Discharge: 2019-05-17 | Disposition: A | Discharge status: Home / Self Care | Payer: Medicare HMO | Source: Ambulatory Visit | Attending: Internal Medicine | Admitting: Internal Medicine

## 2019-05-17 ENCOUNTER — Inpatient Hospital Stay: Payer: Medicare HMO | Attending: Internal Medicine

## 2019-05-17 ENCOUNTER — Other Ambulatory Visit: Payer: Self-pay

## 2019-05-17 DIAGNOSIS — Z923 Personal history of irradiation: Secondary | ICD-10-CM | POA: Diagnosis not present

## 2019-05-17 DIAGNOSIS — Z9221 Personal history of antineoplastic chemotherapy: Secondary | ICD-10-CM | POA: Diagnosis not present

## 2019-05-17 DIAGNOSIS — I700 Atherosclerosis of aorta: Secondary | ICD-10-CM | POA: Diagnosis not present

## 2019-05-17 DIAGNOSIS — C3490 Malignant neoplasm of unspecified part of unspecified bronchus or lung: Secondary | ICD-10-CM | POA: Diagnosis not present

## 2019-05-17 DIAGNOSIS — Z85118 Personal history of other malignant neoplasm of bronchus and lung: Secondary | ICD-10-CM | POA: Diagnosis not present

## 2019-05-17 LAB — CMP (CANCER CENTER ONLY)
ALT: 15 U/L (ref 0–44)
AST: 21 U/L (ref 15–41)
Albumin: 4 g/dL (ref 3.5–5.0)
Alkaline Phosphatase: 40 U/L (ref 38–126)
Anion gap: 8 (ref 5–15)
BUN: 15 mg/dL (ref 8–23)
CO2: 27 mmol/L (ref 22–32)
Calcium: 9.5 mg/dL (ref 8.9–10.3)
Chloride: 103 mmol/L (ref 98–111)
Creatinine: 0.75 mg/dL (ref 0.44–1.00)
GFR, Est AFR Am: >60 mL/min (ref >60)
GFR, Estimated: >60 mL/min (ref >60)
Glucose, Bld: 95 mg/dL (ref 70–99)
Potassium: 4.2 mmol/L (ref 3.5–5.1)
Sodium: 138 mmol/L (ref 135–145)
Total Bilirubin: 0.4 mg/dL (ref 0.3–1.2)
Total Protein: 6.6 g/dL (ref 6.5–8.1)

## 2019-05-17 LAB — CBC WITH DIFFERENTIAL (CANCER CENTER ONLY)
Abs Immature Granulocytes: 0.01 10*3/uL (ref 0.00–0.07)
Basophils Absolute: 0 10*3/uL (ref 0.0–0.1)
Basophils Relative: 1 %
Eosinophils Absolute: 0.1 10*3/uL (ref 0.0–0.5)
Eosinophils Relative: 3 %
HCT: 39.9 % (ref 36.0–46.0)
Hemoglobin: 13.2 g/dL (ref 12.0–15.0)
Immature Granulocytes: 0 %
Lymphocytes Relative: 31 %
Lymphs Abs: 1.4 10*3/uL (ref 0.7–4.0)
MCH: 30.8 pg (ref 26.0–34.0)
MCHC: 33.1 g/dL (ref 30.0–36.0)
MCV: 93 fL (ref 80.0–100.0)
Monocytes Absolute: 0.5 10*3/uL (ref 0.1–1.0)
Monocytes Relative: 10 %
Neutro Abs: 2.5 10*3/uL (ref 1.7–7.7)
Neutrophils Relative %: 55 %
Platelet Count: 245 10*3/uL (ref 150–400)
RBC: 4.29 MIL/uL (ref 3.87–5.11)
RDW: 13.3 % (ref 11.5–15.5)
WBC Count: 4.5 10*3/uL (ref 4.0–10.5)
nRBC: 0 % (ref 0.0–0.2)

## 2019-05-17 MED ORDER — IOHEXOL 300 MG/ML  SOLN: 30.0000 mL | Freq: Once | INTRAMUSCULAR | Status: DC | PRN

## 2019-05-17 MED ORDER — IOHEXOL 300 MG/ML  SOLN
75.0000 mL | Freq: Once | INTRAMUSCULAR | Status: AC | PRN
  Administered 2019-05-17: 12:00:00 75 mL via INTRAVENOUS

## 2019-05-17 MED ORDER — SODIUM CHLORIDE (PF) 0.9 % IJ SOLN
INTRAMUSCULAR | Status: AC
  Filled 2019-05-17: qty 50

## 2019-05-21 ENCOUNTER — Inpatient Hospital Stay: Payer: Medicare HMO | Admitting: Internal Medicine

## 2019-05-21 ENCOUNTER — Encounter: Payer: Self-pay | Admitting: Internal Medicine

## 2019-05-21 ENCOUNTER — Other Ambulatory Visit: Payer: Self-pay

## 2019-05-21 VITALS — BP 143/100 | HR 93 | Temp 98.2°F | Resp 18 | Ht 63.0 in | Wt 144.3 lb

## 2019-05-21 DIAGNOSIS — C3411 Malignant neoplasm of upper lobe, right bronchus or lung: Secondary | ICD-10-CM | POA: Diagnosis not present

## 2019-05-21 DIAGNOSIS — C7951 Secondary malignant neoplasm of bone: Secondary | ICD-10-CM

## 2019-05-21 DIAGNOSIS — C3490 Malignant neoplasm of unspecified part of unspecified bronchus or lung: Secondary | ICD-10-CM

## 2019-05-21 DIAGNOSIS — Z923 Personal history of irradiation: Secondary | ICD-10-CM | POA: Diagnosis not present

## 2019-05-21 DIAGNOSIS — Z85118 Personal history of other malignant neoplasm of bronchus and lung: Secondary | ICD-10-CM | POA: Diagnosis not present

## 2019-05-21 DIAGNOSIS — Z9221 Personal history of antineoplastic chemotherapy: Secondary | ICD-10-CM | POA: Diagnosis not present

## 2019-05-21 DIAGNOSIS — I700 Atherosclerosis of aorta: Secondary | ICD-10-CM | POA: Diagnosis not present

## 2019-05-21 NOTE — Progress Notes (Signed)
.      [facility] Telephone:([phone]) [phone]   Fax:([phone]  OFFICE PROGRESS NOTE  Rose Bishop, DO [address] [place] [address]  DIAGNOSIS: Stage IIIB (T2a, N3, M0) non-small cell lung cancer, adenocarcinoma with negative EGFR mutation and negative ALK gene translocation diagnosed in October 2016.  Biomarker Findings Microsatellite status - MS-Stable Tumor Mutational Burden - TMB-Low (5 Muts/Mb) Genomic Findings For a complete list of the genes assayed, please refer to the Appendix. ROS1 SDC4-ROS1 fusion RBM10 N52f*14 TP53 R273C 7 Disease relevant genes with no reportable alterations: EGFR, KRAS, ALK, BRAF, MET, RET, ERBB2  PRIOR THERAPY:  1) Concurrent chemoradiation with weekly carboplatin for AUC of 2 and paclitaxel 45 MG/M2 status post 6 cycles. Last dose was given 07/13/2015 with partial response. 2) Systemic chemotherapy with carboplatin for AUC of 5 and Alimta 500 MG/M2 every 3 weeks. First dose 11/02/2015. Status post 6 cycles. 3) palliative stereotactic radiotherapy to the left supraclavicular lymph node under the care of Dr. MTammi Klippel  Loss of fraction of radiotherapy scheduled for November 01, 2017  CURRENT THERAPY: Observation.  INTERVAL HISTORY: Rose PIFER63y.o. female returns to the clinic today for 6 months follow-up visit.  The patient is feeling fine today with no concerning complaints.  She denied having any chest pain, shortness of breath, cough or hemoptysis.  She denied having any fever or chills.  She has no nausea, vomiting, diarrhea or constipation.  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  The patient denied having any recent weight loss or night sweats.  She had repeat CT scan of the chest performed recently and she is here for evaluation and discussion of her scan results.  MEDICAL HISTORY: Past Medical History:  Diagnosis Date  . Anxiety   . Back pain    right back lateral  . BCC (basal cell carcinoma of skin)   . Colon polyps   . Complication of anesthesia    pt. states that she is difficult to arouse  . Complication of anesthesia    "indigestion and burping " after eating.  . Diverticulosis   . Dysrhythmia    "skip beat in early 20's"  . H/O cold sores   . History of anemia   . Lung cancer ([facility]    RUL mass -being tx- radiation and chemo- remains last chemo 01-04-16  . Osteoporosis   . PONV (postoperative nausea and vomiting)   . Rectal bleeding   . right upper lobe lung mass 04/15/2015   malignant tumor found - 10'16-radiation and chemotherapy and remains with chemo at present- Dr. MEarlie Serverfollows.    ALLERGIES:  has No Known Allergies.  MEDICATIONS:  Current Outpatient Medications  Medication Sig Dispense Refill  . Biotin 5000 MCG CAPS Take 10,000 mcg by mouth daily.     . Calcium Carb-Cholecalciferol (CALCIUM 1000 + D PO) Take 2 tablets by mouth 2 (two) times daily. D3 = " 25 mcg"    . cetirizine (ZYRTEC) 10 MG tablet Take 10 mg by mouth daily.     . Flaxseed, Linseed, (FLAXSEED OIL) 1000 MG CAPS Take 1 capsule by mouth every evening.    . L-LYSINE PO Take 2,000 mg by mouth daily.     .Marland Kitchenlidocaine-prilocaine (EMLA) cream Apply 1 application topically as needed. 30 g 0  . Omega-3 Fatty Acids (RA FISH OIL) 1400 MG CPDR Take 1 capsule by  mouth 2 (two) times daily.     . psyllium (METAMUCIL) 58.6 % packet Take 1 packet by mouth daily. "takes 2 tbsp"    . Turmeric Curcumin 500 MG CAPS Take 1 each by mouth daily.     . vitamin B-12 (CYANOCOBALAMIN) 1000 MCG tablet Take 1,000 mcg by mouth daily.     No current facility-administered medications for this visit.    Facility-Administered Medications Ordered in Other Visits  Medication Dose Route Frequency Provider Last Rate Last Dose  . sodium chloride 0.9 % injection 10 mL  10 mL Intravenous PRN Curt Bears, MD   10 mL at 01/26/18 1040    SURGICAL HISTORY:  Past Surgical  History:  Procedure Laterality Date  . BREAST EXCISIONAL BIOPSY Right   . BREAST EXCISIONAL BIOPSY Right   . BREAST EXCISIONAL BIOPSY Right   . COLONOSCOPY    . COLONOSCOPY W/ POLYPECTOMY    . COLONOSCOPY WITH PROPOFOL N/A 01/20/2016   Procedure: COLONOSCOPY WITH PROPOFOL;  Surgeon: Arta Silence, MD;  Location: [facility];  Service: Endoscopy;  Laterality: N/A;  . CYSTECTOMY     right breast x 2  . IR REMOVAL TUN ACCESS W/ PORT W/O FL MOD SED  12/12/2018  . MEDIASTINOSCOPY N/A 05/07/2015   Procedure: MEDIASTINOSCOPY;  Surgeon: Ivin Poot, MD;  Location: [facility];  Service: Thoracic;  Laterality: N/A;  . TONSILLECTOMY    . VIDEO BRONCHOSCOPY WITH ENDOBRONCHIAL ULTRASOUND N/A 05/07/2015   Procedure: VIDEO BRONCHOSCOPY WITH ENDOBRONCHIAL ULTRASOUND;  Surgeon: Ivin Poot, MD;  Location: [facility];  Service: Thoracic;  Laterality: N/A;    REVIEW OF SYSTEMS:  A comprehensive review of systems was negative.   PHYSICAL EXAMINATION: General appearance: alert, cooperative and no distress Head: Normocephalic, without obvious abnormality, atraumatic Neck: no adenopathy, no JVD, supple, symmetrical, trachea midline and thyroid not enlarged, symmetric, no tenderness/mass/nodules Lymph nodes: Cervical, supraclavicular, and axillary nodes normal. Resp: clear to auscultation bilaterally Back: symmetric, no curvature. ROM normal. No CVA tenderness. Cardio: regular rate and rhythm, S1, S2 normal, no murmur, click, rub or gallop GI: soft, non-tender; bowel sounds normal; no masses,  no organomegaly Extremities: extremities normal, atraumatic, no cyanosis or edema  ECOG PERFORMANCE STATUS: 0 - Asymptomatic  Blood pressure (!) 143/100, pulse 93, temperature 98.2 F (36.8 C), temperature source Temporal, resp. rate 18, height 5' 3"  (1.6 m), weight 144 lb 4.8 oz (65.5 kg), SpO2 100 %.  LABORATORY DATA: Lab Results  Component Value Date   WBC 4.5 05/17/2019   HGB 13.2 05/17/2019   HCT 39.9  05/17/2019   MCV 93.0 05/17/2019   PLT 245 05/17/2019      Chemistry      Component Value Date/Time   NA 138 05/17/2019 1047   NA 139 03/20/2017 1038   K 4.2 05/17/2019 1047   K 4.3 03/20/2017 1038   CL 103 05/17/2019 1047   CO2 27 05/17/2019 1047   CO2 27 03/20/2017 1038   BUN 15 05/17/2019 1047   BUN 15.4 03/20/2017 1038   CREATININE 0.75 05/17/2019 1047   CREATININE 0.7 03/20/2017 1038      Component Value Date/Time   CALCIUM 9.5 05/17/2019 1047   CALCIUM 9.8 03/20/2017 1038   ALKPHOS 40 05/17/2019 1047   ALKPHOS 37 (L) 03/20/2017 1038   AST 21 05/17/2019 1047   AST 25 03/20/2017 1038   ALT 15 05/17/2019 1047   ALT 22 03/20/2017 1038   BILITOT 0.4 05/17/2019 1047   BILITOT 0.42 03/20/2017 1038  RADIOGRAPHIC STUDIES: Ct Chest W Contrast  Result Date: 05/17/2019 CLINICAL DATA:  63 year old female with history of non-small cell lung cancer. Follow-up evaluation. EXAM: CT CHEST WITH CONTRAST TECHNIQUE: Multidetector CT imaging of the chest was performed during intravenous contrast administration. CONTRAST:  68m OMNIPAQUE IOHEXOL 300 MG/ML  SOLN COMPARISON:  Chest CT 11/16/2018. FINDINGS: Cardiovascular: Heart size is normal. There is no significant pericardial fluid, thickening or pericardial calcification. Aortic atherosclerosis. No definite coronary artery calcifications. Mediastinum/Nodes: No pathologically enlarged mediastinal or hilar lymph nodes. Esophagus is unremarkable in appearance. No axillary lymphadenopathy. Lungs/Pleura: Postprocedural changes of radiation therapy are again noted in the right lung with areas of chronic postradiation mass-like fibrosis in the medial aspect of the right mid to upper lung, stable compared to the prior examination. 5 mm subpleural nodule in the right upper lobe near the apex (axial image 21 of series 5), stable and likely benign. No other suspicious appearing pulmonary nodules or masses are noted. No acute consolidative airspace  disease. No pleural effusions. Upper Abdomen: Unremarkable. Musculoskeletal: Sclerotic lesion in the left side of the T2 vertebral body extending into the pedicle, similar to the prior study. In addition, there is a sclerotic lesion in the left side of the C7 vertebral body which is more apparent than the prior exam. Sclerosis in the left acromion incompletely imaged, but similar in retrospect to the prior study, likely an additional metastatic lesion. IMPRESSION: 1. Stable chronic postradiation mass-like fibrosis in the right lung, without evidence to suggest local recurrence or definite metastatic disease. 2. Sclerotic lesion in T2 vertebral body on the left side extending into the lamina, similar to the prior examination, as is a sclerotic lesion in the left acromion. There is also a sclerotic lesion in the left side of C7 vertebral body which is more apparent than the prior study which may represent an additional metastatic lesion. 3. Aortic atherosclerosis. Aortic Atherosclerosis (ICD10-I70.0). Electronically Signed   By: DVinnie LangtonM.D.   On: 05/17/2019 13:20   ASSESSMENT AND PLAN:  This is a very pleasant 63years old white female with a stage IIIB non-small cell lung cancer, adenocarcinoma with negative EGFR, ALK mutations diagnosed in October 2016.  Molecular studies showed positive ROS 1 She is status post course of concurrent chemoradiation followed by consolidation chemotherapy with carboplatin and Alimta. The patient has been in observation for close to 3 years. She underwent palliative radiotherapy to the left supraclavicular lymph nodes with improvement of her disease. The patient has been in observation since that time and she is feeling fine. She had repeat CT scan of the chest performed recently.  I personally and independently reviewed the scans and discussed the results with the patient today. Her scan showed no concerning findings for disease progression except for prominent  appearance of the C7 vertebral body lesion. I recommended for the patient to continue on observation with repeat CT scan of the chest in 6 months. She was also advised to call immediately if she has any concerning symptoms in the interval. The patient voices understanding of current disease status and treatment options and is in agreement with the current care plan. All questions were answered. The patient knows to call the clinic with any problems, questions or concerns. We can certainly see the patient much sooner if necessary. I spent 10 minutes counseling the patient face to face. The total time spent in the appointment was 15 minutes.  Disclaimer: This note was dictated with voice recognition software. Similar sounding words can  inadvertently be transcribed and may not be corrected upon review.

## 2019-05-22 ENCOUNTER — Telehealth: Payer: Self-pay | Admitting: Internal Medicine

## 2019-05-22 NOTE — Telephone Encounter (Signed)
Scheduled appt per 10/27 los.  Spoke with pt and she is aware of her appt dates and time.

## 2019-06-18 DIAGNOSIS — Z124 Encounter for screening for malignant neoplasm of cervix: Secondary | ICD-10-CM | POA: Diagnosis not present

## 2019-07-11 DIAGNOSIS — H5213 Myopia, bilateral: Secondary | ICD-10-CM | POA: Diagnosis not present

## 2019-07-11 DIAGNOSIS — H2513 Age-related nuclear cataract, bilateral: Secondary | ICD-10-CM | POA: Diagnosis not present

## 2019-07-11 DIAGNOSIS — H40013 Open angle with borderline findings, low risk, bilateral: Secondary | ICD-10-CM | POA: Diagnosis not present

## 2019-07-16 DIAGNOSIS — M810 Age-related osteoporosis without current pathological fracture: Secondary | ICD-10-CM | POA: Diagnosis not present

## 2019-08-01 DIAGNOSIS — R69 Illness, unspecified: Secondary | ICD-10-CM | POA: Diagnosis not present

## 2019-10-29 DIAGNOSIS — R197 Diarrhea, unspecified: Secondary | ICD-10-CM | POA: Diagnosis not present

## 2019-11-15 ENCOUNTER — Inpatient Hospital Stay: Payer: Medicare HMO | Attending: Internal Medicine

## 2019-11-15 ENCOUNTER — Other Ambulatory Visit: Payer: Self-pay

## 2019-11-15 ENCOUNTER — Ambulatory Visit (HOSPITAL_COMMUNITY)
Admission: RE | Admit: 2019-11-15 | Discharge: 2019-11-15 | Disposition: A | Discharge status: Home / Self Care | Payer: Medicare HMO | Source: Ambulatory Visit | Attending: Internal Medicine | Admitting: Internal Medicine

## 2019-11-15 DIAGNOSIS — C3490 Malignant neoplasm of unspecified part of unspecified bronchus or lung: Secondary | ICD-10-CM

## 2019-11-15 DIAGNOSIS — C7951 Secondary malignant neoplasm of bone: Secondary | ICD-10-CM | POA: Diagnosis not present

## 2019-11-15 DIAGNOSIS — Z85828 Personal history of other malignant neoplasm of skin: Secondary | ICD-10-CM | POA: Insufficient documentation

## 2019-11-15 DIAGNOSIS — C3411 Malignant neoplasm of upper lobe, right bronchus or lung: Secondary | ICD-10-CM | POA: Diagnosis not present

## 2019-11-15 LAB — CBC WITH DIFFERENTIAL (CANCER CENTER ONLY)
Abs Immature Granulocytes: 0.01 10*3/uL (ref 0.00–0.07)
Basophils Absolute: 0 10*3/uL (ref 0.0–0.1)
Basophils Relative: 1 %
Eosinophils Absolute: 0.1 10*3/uL (ref 0.0–0.5)
Eosinophils Relative: 2 %
HCT: 41.4 % (ref 36.0–46.0)
Hemoglobin: 13.4 g/dL (ref 12.0–15.0)
Immature Granulocytes: 0 %
Lymphocytes Relative: 28 %
Lymphs Abs: 1.2 10*3/uL (ref 0.7–4.0)
MCH: 30.6 pg (ref 26.0–34.0)
MCHC: 32.4 g/dL (ref 30.0–36.0)
MCV: 94.5 fL (ref 80.0–100.0)
Monocytes Absolute: 0.5 10*3/uL (ref 0.1–1.0)
Monocytes Relative: 11 %
Neutro Abs: 2.5 10*3/uL (ref 1.7–7.7)
Neutrophils Relative %: 58 %
Platelet Count: 281 10*3/uL (ref 150–400)
RBC: 4.38 MIL/uL (ref 3.87–5.11)
RDW: 13.3 % (ref 11.5–15.5)
WBC Count: 4.3 10*3/uL (ref 4.0–10.5)
nRBC: 0 % (ref 0.0–0.2)

## 2019-11-15 LAB — CMP (CANCER CENTER ONLY)
ALT: 21 U/L (ref 0–44)
AST: 23 U/L (ref 15–41)
Albumin: 3.9 g/dL (ref 3.5–5.0)
Alkaline Phosphatase: 45 U/L (ref 38–126)
Anion gap: 8 (ref 5–15)
BUN: 13 mg/dL (ref 8–23)
CO2: 27 mmol/L (ref 22–32)
Calcium: 9.3 mg/dL (ref 8.9–10.3)
Chloride: 106 mmol/L (ref 98–111)
Creatinine: 0.8 mg/dL (ref 0.44–1.00)
GFR, Est AFR Am: >60 mL/min (ref >60)
GFR, Estimated: >60 mL/min (ref >60)
Glucose, Bld: 101 mg/dL — ABNORMAL HIGH (ref 70–99)
Potassium: 4.4 mmol/L (ref 3.5–5.1)
Sodium: 141 mmol/L (ref 135–145)
Total Bilirubin: 0.6 mg/dL (ref 0.3–1.2)
Total Protein: 7 g/dL (ref 6.5–8.1)

## 2019-11-15 MED ORDER — SODIUM CHLORIDE (PF) 0.9 % IJ SOLN
INTRAMUSCULAR | Status: AC
  Filled 2019-11-15: qty 50

## 2019-11-15 MED ORDER — IOHEXOL 300 MG/ML  SOLN
75.0000 mL | Freq: Once | INTRAMUSCULAR | Status: AC | PRN
  Administered 2019-11-15: 75 mL via INTRAVENOUS

## 2019-11-18 DIAGNOSIS — C44619 Basal cell carcinoma of skin of left upper limb, including shoulder: Secondary | ICD-10-CM | POA: Diagnosis not present

## 2019-11-18 DIAGNOSIS — L570 Actinic keratosis: Secondary | ICD-10-CM | POA: Diagnosis not present

## 2019-11-18 DIAGNOSIS — L821 Other seborrheic keratosis: Secondary | ICD-10-CM | POA: Diagnosis not present

## 2019-11-18 DIAGNOSIS — L820 Inflamed seborrheic keratosis: Secondary | ICD-10-CM | POA: Diagnosis not present

## 2019-11-19 ENCOUNTER — Encounter: Payer: Self-pay | Admitting: *Deleted

## 2019-11-19 ENCOUNTER — Encounter: Payer: Self-pay | Admitting: Internal Medicine

## 2019-11-19 ENCOUNTER — Inpatient Hospital Stay: Payer: Medicare HMO | Admitting: Internal Medicine

## 2019-11-19 ENCOUNTER — Other Ambulatory Visit: Payer: Self-pay

## 2019-11-19 VITALS — BP 133/86 | HR 82 | Temp 98.5°F | Resp 20 | Ht 63.0 in | Wt 147.0 lb

## 2019-11-19 DIAGNOSIS — C7951 Secondary malignant neoplasm of bone: Secondary | ICD-10-CM

## 2019-11-19 DIAGNOSIS — C3490 Malignant neoplasm of unspecified part of unspecified bronchus or lung: Secondary | ICD-10-CM

## 2019-11-19 DIAGNOSIS — C3411 Malignant neoplasm of upper lobe, right bronchus or lung: Secondary | ICD-10-CM | POA: Diagnosis not present

## 2019-11-19 NOTE — Progress Notes (Signed)
.      [facility] Telephone:(336) [phone]   Fax:(336) [phone]  OFFICE PROGRESS NOTE  Lyman Bishop, DO [address] [place] [address]  DIAGNOSIS: Stage IIIB (T2a, N3, M0) non-small cell lung cancer, adenocarcinoma with negative EGFR mutation and negative ALK gene translocation diagnosed in October 2016.  Biomarker Findings Microsatellite status - MS-Stable Tumor Mutational Burden - TMB-Low (5 Muts/Mb) Genomic Findings For a complete list of the genes assayed, please refer to the Appendix. ROS1 SDC4-ROS1 fusion RBM10 N595f*14 TP53 R273C 7 Disease relevant genes with no reportable alterations: EGFR, KRAS, ALK, BRAF, MET, RET, ERBB2  PRIOR THERAPY:  1) Concurrent chemoradiation with weekly carboplatin for AUC of 2 and paclitaxel 45 MG/M2 status post 6 cycles. Last dose was given 07/13/2015 with partial response. 2) Systemic chemotherapy with carboplatin for AUC of 5 and Alimta 500 MG/M2 every 3 weeks. First dose 11/02/2015. Status post 6 cycles. 3) palliative stereotactic radiotherapy to the left supraclavicular lymph node under the care of Dr. MTammi Klippel  Loss of fraction of radiotherapy scheduled for November 01, 2017  CURRENT THERAPY: Observation.  INTERVAL HISTORY: Rose VASSEL64y.o. female returns to the clinic today for 6 months follow-up visit.  The patient is feeling fine today with no concerning complaints except for back pain.  She denied having any current chest pain, shortness of breath, cough or hemoptysis.  She denied having any nausea, vomiting, diarrhea or constipation.  She has no headache or visual changes.  She has been in observation for the last 2 years.  She had repeat CT scan of the chest performed recently and she is here for evaluation and discussion of her risk her results.   MEDICAL HISTORY: Past Medical History:  Diagnosis Date  . Anxiety   . Back pain    right back lateral  . BCC (basal cell carcinoma of skin)   .  Colon polyps   . Complication of anesthesia    pt. states that she is difficult to arouse  . Complication of anesthesia    "indigestion and burping " after eating.  . Diverticulosis   . Dysrhythmia    "skip beat in early 20's"  . H/O cold sores   . History of anemia   . [facility] [place]    RUL mass -being tx- radiation and chemo- remains last chemo 01-04-16  . Osteoporosis   . PONV (postoperative nausea and vomiting)   . Rectal bleeding   . right upper lobe lung mass 04/15/2015   malignant tumor found - 10'16-radiation and chemotherapy and remains with chemo at present- Dr. MEarlie Serverfollows.    ALLERGIES:  has No Known Allergies.  MEDICATIONS:  Current Outpatient Medications  Medication Sig Dispense Refill  . OVER THE COUNTER MEDICATION Take 500 mg by mouth in the morning, at noon, and at bedtime. "Metamucil"    . psyllium (REGULOID) 0.52 g capsule Take 0.52 g by mouth daily.    . Biotin 5000 MCG CAPS Take 10,000 mcg by mouth daily.     . Calcium Carb-Cholecalciferol (CALCIUM 1000 + D PO) Take 2 tablets by mouth 2 (two) times daily. D3 = " 25 mcg"    . Flaxseed, Linseed, (FLAXSEED OIL) 1000 MG CAPS Take 1 capsule by mouth every evening.    . L-LYSINE PO Take 2,000 mg by mouth daily.     . Omega-3 Fatty Acids (RA FISH OIL) 1400 MG CPDR Take 1 capsule by mouth 2 (two) times  daily.     . Turmeric Curcumin 500 MG CAPS Take 1 each by mouth daily.     . vitamin B-12 (CYANOCOBALAMIN) 1000 MCG tablet Take 1,000 mcg by mouth daily.     No current facility-administered medications for this visit.    SURGICAL HISTORY:  Past Surgical History:  Procedure Laterality Date  . BREAST EXCISIONAL BIOPSY Right   . BREAST EXCISIONAL BIOPSY Right   . BREAST EXCISIONAL BIOPSY Right   . COLONOSCOPY    . COLONOSCOPY W/ POLYPECTOMY    . COLONOSCOPY WITH PROPOFOL N/A 01/20/2016   Procedure: COLONOSCOPY WITH PROPOFOL;  Surgeon: Arta Silence, MD;  Location: [facility];  Service: Endoscopy;   Laterality: N/A;  . CYSTECTOMY     right breast x 2  . IR REMOVAL TUN ACCESS W/ PORT W/O FL MOD SED  12/12/2018  . MEDIASTINOSCOPY N/A 05/07/2015   Procedure: MEDIASTINOSCOPY;  Surgeon: Ivin Poot, MD;  Location: [facility];  Service: Thoracic;  Laterality: N/A;  . TONSILLECTOMY    . VIDEO BRONCHOSCOPY WITH ENDOBRONCHIAL ULTRASOUND N/A 05/07/2015   Procedure: VIDEO BRONCHOSCOPY WITH ENDOBRONCHIAL ULTRASOUND;  Surgeon: Ivin Poot, MD;  Location: [facility];  Service: Thoracic;  Laterality: N/A;    REVIEW OF SYSTEMS:  A comprehensive review of systems was negative except for: Musculoskeletal: positive for back pain   PHYSICAL EXAMINATION: General appearance: alert, cooperative and no distress Head: Normocephalic, without obvious abnormality, atraumatic Neck: no adenopathy, no JVD, supple, symmetrical, trachea midline and thyroid not enlarged, symmetric, no tenderness/mass/nodules Lymph nodes: Cervical, supraclavicular, and axillary nodes normal. Resp: clear to auscultation bilaterally Back: symmetric, no curvature. ROM normal. No CVA tenderness. Cardio: regular rate and rhythm, S1, S2 normal, no murmur, click, rub or gallop GI: soft, non-tender; bowel sounds normal; no masses,  no organomegaly Extremities: extremities normal, atraumatic, no cyanosis or edema  ECOG PERFORMANCE STATUS: 0 - Asymptomatic  Blood pressure 133/86, pulse 82, temperature 98.5 F (36.9 C), temperature source Temporal, resp. rate 20, height _0  (1.6 m), weight 147 lb (66.7 kg), SpO2 100 %.  LABORATORY DATA: Lab Results  Component Value Date   WBC 4.3 11/15/2019   HGB 13.4 11/15/2019   HCT 41.4 11/15/2019   MCV 94.5 11/15/2019   PLT 281 11/15/2019      Chemistry      Component Value Date/Time   NA 141 11/15/2019 0759   NA 139 03/20/2017 1038   K 4.4 11/15/2019 0759   K 4.3 03/20/2017 1038   CL 106 11/15/2019 0759   CO2 27 11/15/2019 0759   CO2 27 03/20/2017 1038   BUN 13 11/15/2019 0759   BUN 15.4  03/20/2017 1038   CREATININE 0.80 11/15/2019 0759   CREATININE 0.7 03/20/2017 1038      Component Value Date/Time   CALCIUM 9.3 11/15/2019 0759   CALCIUM 9.8 03/20/2017 1038   ALKPHOS 45 11/15/2019 0759   ALKPHOS 37 (L) 03/20/2017 1038   AST 23 11/15/2019 0759   AST 25 03/20/2017 1038   ALT 21 11/15/2019 0759   ALT 22 03/20/2017 1038   BILITOT 0.6 11/15/2019 0759   BILITOT 0.42 03/20/2017 1038       RADIOGRAPHIC STUDIES: CT Chest W Contrast  Result Date: 11/15/2019 CLINICAL DATA:  Non-small cell lung cancer staging, history of prior bony metastatic disease status post radiotherapy. EXAM: CT CHEST WITH CONTRAST TECHNIQUE: Multidetector CT imaging of the chest was performed during intravenous contrast administration. CONTRAST:  67m OMNIPAQUE IOHEXOL 300 MG/ML  SOLN COMPARISON:  Most recent comparison 05/17/2019 FINDINGS: Cardiovascular: Mild calcified and noncalcified plaque throughout the thoracic aorta. No sign of aneurysm. The central pulmonary vasculature unremarkable aside from RIGHT hilar distortion in the setting of prior to therapy in the RIGHT hilum. Small pericardial effusion is unchanged. Mediastinum/Nodes: Small LEFT thoracic inlet lymph nodes less than a cm unchanged from the previous exam largest approximately 5-6 mm. Thyroid is normal. Mild stranding in the mediastinum similar to prior study following prior therapy. Soft tissue at the RIGHT hilum with distortion of hilar structures similar to prior study measuring 4.2 x 2.0 cm along the RIGHT mediastinal border. Margins are similar along with extension towards the subcarinal region in the as ago esophageal recess. Lungs/Pleura: Small RIGHT-sided pleural effusion as developed since the prior study depth approximately 1 cm. This tracks into the fissural reflections towards the area of treated disease in the RIGHT chest. Parenchymal scarring in the RIGHT upper lobe similar to the previous exam. LEFT upper lobe pulmonary nodule (image  45, series 5) slightly enlarged, previously 5 mm now approximately 7 x 7 mm. This area was not present in 2019. Scarring along the LEFT mediastinal border is similar to the prior study. Upper Abdomen: Incidental imaging of upper abdominal contents without acute abnormality. Musculoskeletal: Fractures with callus formation along the posterior RIGHT chest show increased callus formation but do not appear to be united at this point. These involve posterior ribs 3, 4, 5, 6, 7 and 8. Unchanged sclerosis of LEFT scapula involving the acromium. Sclerosis in T2 also unchanged. Mild bony sclerosis at the anterior margin of T1 similar to the prior study. IMPRESSION: 1. Small RIGHT-sided pleural effusion as developed since the prior study depth approximately 1 cm. This tracks into the fissural reflections towards the area of treated disease in the RIGHT chest. 2. Slow, progressive enlargement of a LEFT upper lobe pulmonary nodule amidst vessels in the LEFT upper chest, suspicious for site of metastatic disease. 3. Soft tissue at the RIGHT hilum with distortion of hilar structures similar to prior study. 4. Bony metastatic disease as described. 5. Ununited rib fractures in the posterior RIGHT chest with signs of callus formation. 6. Small LEFT thoracic inlet lymph nodes, attention on follow-up 7. Aortic atherosclerosis. Aortic Atherosclerosis (ICD10-I70.0). Electronically Signed   By: Zetta Bills M.D.   On: 11/15/2019 13:55   ASSESSMENT AND PLAN:  This is a very pleasant 64 years old white female with a stage IIIB non-small cell lung cancer, adenocarcinoma with negative EGFR, ALK mutations diagnosed in October 2016.  Molecular studies showed positive ROS 1 She is status post course of concurrent chemoradiation followed by consolidation chemotherapy with carboplatin and Alimta. The patient has been in observation for close to 3 years. She underwent palliative radiotherapy to the left supraclavicular lymph nodes with  improvement of her disease. She has been in observation for the last 2 years.  The patient is feeling fine with no concerning complaints except for intermittent low back pain.  She had repeat CT scan of the chest performed recently.  I personally and independently reviewed the scan images and discussed the results with the patient today. Her scan showed stable disease in general except for slowly growing left upper lobe pulmonary nodule that increased by 2 mm compared to the previous scan. I recommended for the patient to continue on observation with repeat CT scan of the chest in 4 months for reevaluation and close monitoring of her disease. She was advised to call immediately if she has any  concerning symptoms in the interval. The patient voices understanding of current disease status and treatment options and is in agreement with the current care plan. All questions were answered. The patient knows to call the clinic with any problems, questions or concerns. We can certainly see the patient much sooner if necessary.  Disclaimer: This note was dictated with voice recognition software. Similar sounding words can inadvertently be transcribed and may not be corrected upon review.

## 2019-11-19 NOTE — Progress Notes (Signed)
Oncology Nurse Navigator Documentation  Oncology Nurse Navigator Flowsheets 11/19/2019  Navigator Location [facility]  Referral Date to RadOnc/MedOnc -  Navigator Encounter Type Clinic/MDC/I was able to see Rose Figueroa at clinic today.  Per Dr. Julien Nordmann there is an area of concern on recent scan that will need close monitoring.  I spoke to her about this and she seemed ok and understands she has a follow up in 4 months with a scan.  I did ask that she call us if she develop symptoms that we need to address.  It is always great seeing Rose Figueroa, she is always so nice and pleasant.    Telephone -  Patient Visit Type MedOnc  Treatment Phase Abnormal Scans  Barriers/Navigation Needs Education  Education Other  Interventions Education;Psycho-Social Support  Acuity Level 2-Minimal Needs (1-2 Barriers Identified)  Coordination of Care -  Education Method Verbal  Time Spent with Patient 15

## 2019-11-20 ENCOUNTER — Telehealth: Payer: Self-pay | Admitting: Internal Medicine

## 2019-11-20 NOTE — Telephone Encounter (Signed)
Scheduled per los. Called and spoke with patient. Confirmed appt 

## 2019-11-21 ENCOUNTER — Telehealth: Payer: Self-pay | Admitting: Medical Oncology

## 2019-11-21 NOTE — Telephone Encounter (Signed)
No. They are healing.

## 2019-11-21 NOTE — Telephone Encounter (Signed)
Re" rib fx"- 15 years ago she had a bad fall off a horse  landed on back and lower spine.    Does she need to do anything about fractures?

## 2019-11-22 NOTE — Telephone Encounter (Signed)
Pt.notified

## 2019-11-25 DIAGNOSIS — R69 Illness, unspecified: Secondary | ICD-10-CM | POA: Diagnosis not present

## 2019-12-30 ENCOUNTER — Other Ambulatory Visit: Payer: Self-pay | Admitting: Nurse Practitioner

## 2019-12-30 DIAGNOSIS — Z1231 Encounter for screening mammogram for malignant neoplasm of breast: Secondary | ICD-10-CM

## 2019-12-30 DIAGNOSIS — E2839 Other primary ovarian failure: Secondary | ICD-10-CM

## 2019-12-31 ENCOUNTER — Telehealth: Payer: Self-pay | Admitting: Medical Oncology

## 2019-12-31 NOTE — Telephone Encounter (Signed)
Faxed recent calcium result to [facility].

## 2020-01-14 DIAGNOSIS — M810 Age-related osteoporosis without current pathological fracture: Secondary | ICD-10-CM | POA: Diagnosis not present

## 2020-03-20 ENCOUNTER — Other Ambulatory Visit: Payer: Self-pay

## 2020-03-20 ENCOUNTER — Inpatient Hospital Stay: Payer: Medicare HMO | Attending: Internal Medicine

## 2020-03-20 ENCOUNTER — Ambulatory Visit (HOSPITAL_COMMUNITY)
Admission: RE | Admit: 2020-03-20 | Discharge: 2020-03-20 | Disposition: A | Discharge status: Home / Self Care | Payer: Medicare HMO | Source: Ambulatory Visit | Attending: Internal Medicine | Admitting: Internal Medicine

## 2020-03-20 DIAGNOSIS — C3490 Malignant neoplasm of unspecified part of unspecified bronchus or lung: Secondary | ICD-10-CM | POA: Diagnosis not present

## 2020-03-20 DIAGNOSIS — I2510 Atherosclerotic heart disease of native coronary artery without angina pectoris: Secondary | ICD-10-CM | POA: Diagnosis not present

## 2020-03-20 DIAGNOSIS — C7951 Secondary malignant neoplasm of bone: Secondary | ICD-10-CM | POA: Diagnosis not present

## 2020-03-20 DIAGNOSIS — Z8719 Personal history of other diseases of the digestive system: Secondary | ICD-10-CM | POA: Diagnosis not present

## 2020-03-20 DIAGNOSIS — Z923 Personal history of irradiation: Secondary | ICD-10-CM | POA: Diagnosis not present

## 2020-03-20 DIAGNOSIS — Z9221 Personal history of antineoplastic chemotherapy: Secondary | ICD-10-CM | POA: Insufficient documentation

## 2020-03-20 DIAGNOSIS — Z85828 Personal history of other malignant neoplasm of skin: Secondary | ICD-10-CM | POA: Diagnosis not present

## 2020-03-20 DIAGNOSIS — I517 Cardiomegaly: Secondary | ICD-10-CM | POA: Insufficient documentation

## 2020-03-20 DIAGNOSIS — I700 Atherosclerosis of aorta: Secondary | ICD-10-CM | POA: Diagnosis not present

## 2020-03-20 LAB — CMP (CANCER CENTER ONLY)
ALT: 22 U/L (ref 0–44)
AST: 27 U/L (ref 15–41)
Albumin: 4.2 g/dL (ref 3.5–5.0)
Alkaline Phosphatase: 36 U/L — ABNORMAL LOW (ref 38–126)
Anion gap: 9 (ref 5–15)
BUN: 20 mg/dL (ref 8–23)
CO2: 26 mmol/L (ref 22–32)
Calcium: 9.5 mg/dL (ref 8.9–10.3)
Chloride: 102 mmol/L (ref 98–111)
Creatinine: 0.71 mg/dL (ref 0.44–1.00)
GFR, Est AFR Am: >60 mL/min (ref >60)
GFR, Estimated: >60 mL/min (ref >60)
Glucose, Bld: 94 mg/dL (ref 70–99)
Potassium: 4.3 mmol/L (ref 3.5–5.1)
Sodium: 137 mmol/L (ref 135–145)
Total Bilirubin: 0.5 mg/dL (ref 0.3–1.2)
Total Protein: 6.9 g/dL (ref 6.5–8.1)

## 2020-03-20 LAB — CBC WITH DIFFERENTIAL (CANCER CENTER ONLY)
Abs Immature Granulocytes: 0.01 10*3/uL (ref 0.00–0.07)
Basophils Absolute: 0 10*3/uL (ref 0.0–0.1)
Basophils Relative: 1 %
Eosinophils Absolute: 0.1 10*3/uL (ref 0.0–0.5)
Eosinophils Relative: 2 %
HCT: 41.4 % (ref 36.0–46.0)
Hemoglobin: 13.7 g/dL (ref 12.0–15.0)
Immature Granulocytes: 0 %
Lymphocytes Relative: 36 %
Lymphs Abs: 1.6 10*3/uL (ref 0.7–4.0)
MCH: 30.4 pg (ref 26.0–34.0)
MCHC: 33.1 g/dL (ref 30.0–36.0)
MCV: 91.8 fL (ref 80.0–100.0)
Monocytes Absolute: 0.5 10*3/uL (ref 0.1–1.0)
Monocytes Relative: 12 %
Neutro Abs: 2.1 10*3/uL (ref 1.7–7.7)
Neutrophils Relative %: 49 %
Platelet Count: 286 10*3/uL (ref 150–400)
RBC: 4.51 MIL/uL (ref 3.87–5.11)
RDW: 13.8 % (ref 11.5–15.5)
WBC Count: 4.3 10*3/uL (ref 4.0–10.5)
nRBC: 0 % (ref 0.0–0.2)

## 2020-03-20 MED ORDER — IOHEXOL 300 MG/ML  SOLN
75.0000 mL | Freq: Once | INTRAMUSCULAR | Status: AC | PRN
  Administered 2020-03-20: 75 mL via INTRAVENOUS

## 2020-03-20 MED ORDER — SODIUM CHLORIDE (PF) 0.9 % IJ SOLN
INTRAMUSCULAR | Status: AC
  Filled 2020-03-20: qty 50

## 2020-03-23 ENCOUNTER — Other Ambulatory Visit: Payer: Self-pay

## 2020-03-23 ENCOUNTER — Encounter: Payer: Self-pay | Admitting: Internal Medicine

## 2020-03-23 ENCOUNTER — Inpatient Hospital Stay: Payer: Medicare HMO | Admitting: Internal Medicine

## 2020-03-23 VITALS — BP 121/87 | HR 93 | Temp 97.6°F | Resp 18 | Ht 63.0 in | Wt 144.3 lb

## 2020-03-23 DIAGNOSIS — C770 Secondary and unspecified malignant neoplasm of lymph nodes of head, face and neck: Secondary | ICD-10-CM | POA: Diagnosis not present

## 2020-03-23 DIAGNOSIS — Z5111 Encounter for antineoplastic chemotherapy: Secondary | ICD-10-CM | POA: Diagnosis not present

## 2020-03-23 DIAGNOSIS — C7951 Secondary malignant neoplasm of bone: Secondary | ICD-10-CM | POA: Diagnosis not present

## 2020-03-23 DIAGNOSIS — C3411 Malignant neoplasm of upper lobe, right bronchus or lung: Secondary | ICD-10-CM | POA: Diagnosis not present

## 2020-03-23 DIAGNOSIS — C3490 Malignant neoplasm of unspecified part of unspecified bronchus or lung: Secondary | ICD-10-CM

## 2020-03-23 NOTE — Progress Notes (Signed)
.      [place] Telephone:([phone]) [phone]   [phone]  OFFICE PROGRESS NOTE  Lyman Bishop, DO [address] [place] [address]  DIAGNOSIS: Stage IIIB (T2a, N3, M0) non-small cell lung cancer, adenocarcinoma with negative EGFR mutation and negative ALK gene translocation diagnosed in October 2016.  Biomarker Findings Microsatellite status - MS-Stable Tumor Mutational Burden - TMB-Low (5 Muts/Mb) Genomic Findings For a complete list of the genes assayed, please refer to the Appendix. ROS1 SDC4-ROS1 fusion RBM10 N540f*14 TP53 R273C 7 Disease relevant genes with no reportable alterations: EGFR, KRAS, ALK, BRAF, MET, RET, ERBB2  PRIOR THERAPY:  1) Concurrent chemoradiation with weekly carboplatin for AUC of 2 and paclitaxel 45 MG/M2 status post 6 cycles. Last dose was given 07/13/2015 with partial response. 2) Systemic chemotherapy with carboplatin for AUC of 5 and Alimta 500 MG/M2 every 3 weeks. First dose 11/02/2015. Status post 6 cycles. 3) palliative stereotactic radiotherapy to the left supraclavicular lymph node under the care of Dr. MTammi Klippel  Loss of fraction of radiotherapy scheduled for November 01, 2017  CURRENT THERAPY: Observation.  INTERVAL HISTORY: Rose [id number]. female returns to the clinic today for follow-up visit.  The patient is very anxious today and worried about too many things including her dog who is dying.  She also read the report of her scan online and worried about the mentioning of atherosclerosis as well as emphysema in addition to the enlargement of the pulmonary nodule and lymph node.  She denied having any current chest pain but has shortness of breath with exertion and no cough or hemoptysis.  She denied having any fever or chills.  She has no nausea, vomiting, diarrhea or constipation.  She has no weight loss or night sweats.  She has no headache or visual changes.  She had repeat CT scan of the chest and she  is here for evaluation and discussion of her scan results.   MEDICAL HISTORY: Past Medical History:  Diagnosis Date  . Anxiety   . Back pain    right back lateral  . BCC (basal cell carcinoma of skin)   . Colon polyps   . Complication of anesthesia    pt. states that she is difficult to arouse  . Complication of anesthesia    "indigestion and burping " after eating.  . Diverticulosis   . Dysrhythmia    "skip beat in early 20's"  . H/O cold sores   . History of anemia   . [facility] [place]    RUL mass -being tx- radiation and chemo- remains last chemo 01-04-16  . Osteoporosis   . PONV (postoperative nausea and vomiting)   . Rectal bleeding   . right upper lobe lung mass 04/15/2015   malignant tumor found - 10'16-radiation and chemotherapy and remains with chemo at present- Dr. MEarlie Serverfollows.    ALLERGIES:  has No Known Allergies.  MEDICATIONS:  Current Outpatient Medications  Medication Sig Dispense Refill  . Biotin 5000 MCG CAPS Take 10,000 mcg by mouth daily.     . Calcium Carb-Cholecalciferol (CALCIUM 1000 + D PO) Take 2 tablets by mouth 2 (two) times daily. D3 = " 25 mcg"    . Flaxseed, Linseed, (FLAXSEED OIL) 1000 MG CAPS Take 1 capsule by mouth every evening.    . L-LYSINE PO Take 2,000 mg by mouth daily.     . Omega-3 Fatty Acids (RA FISH OIL) 1400 MG CPDR Take 1  capsule by mouth 2 (two) times daily.     Marland Kitchen OVER THE COUNTER MEDICATION Take 500 mg by mouth in the morning, at noon, and at bedtime. "Metamucil"    . psyllium (REGULOID) 0.52 g capsule Take 0.52 g by mouth daily.    . Turmeric Curcumin 500 MG CAPS Take 1 each by mouth daily.     . vitamin B-12 (CYANOCOBALAMIN) 1000 MCG tablet Take 1,000 mcg by mouth daily.     No current facility-administered medications for this visit.    SURGICAL HISTORY:  Past Surgical History:  Procedure Laterality Date  . BREAST EXCISIONAL BIOPSY Right   . BREAST EXCISIONAL BIOPSY Right   . BREAST EXCISIONAL BIOPSY Right   .  COLONOSCOPY    . COLONOSCOPY W/ POLYPECTOMY    . COLONOSCOPY WITH PROPOFOL N/A 01/20/2016   Procedure: COLONOSCOPY WITH PROPOFOL;  Surgeon: Arta Silence, MD;  Location: [facility];  Service: Endoscopy;  Laterality: N/A;  . CYSTECTOMY     right breast x 2  . IR REMOVAL TUN ACCESS W/ PORT W/O FL MOD SED  12/12/2018  . MEDIASTINOSCOPY N/A 05/07/2015   Procedure: MEDIASTINOSCOPY;  Surgeon: Ivin Poot, MD;  Location: [facility];  Service: Thoracic;  Laterality: N/A;  . TONSILLECTOMY    . VIDEO BRONCHOSCOPY WITH ENDOBRONCHIAL ULTRASOUND N/A 05/07/2015   Procedure: VIDEO BRONCHOSCOPY WITH ENDOBRONCHIAL ULTRASOUND;  Surgeon: Ivin Poot, MD;  Location: [facility];  Service: Thoracic;  Laterality: N/A;    REVIEW OF SYSTEMS:  Constitutional: negative Eyes: negative Ears, nose, mouth, throat, and face: negative Respiratory: positive for dyspnea on exertion Cardiovascular: negative Gastrointestinal: negative Genitourinary:negative Integument/breast: negative Hematologic/lymphatic: negative Musculoskeletal:negative Neurological: negative Behavioral/Psych: negative Endocrine: negative Allergic/Immunologic: negative   PHYSICAL EXAMINATION: General appearance: alert, cooperative and no distress Head: Normocephalic, without obvious abnormality, atraumatic Neck: no adenopathy, no JVD, supple, symmetrical, trachea midline and thyroid not enlarged, symmetric, no tenderness/mass/nodules Lymph nodes: Cervical, supraclavicular, and axillary nodes normal. Resp: clear to auscultation bilaterally Back: symmetric, no curvature. ROM normal. No CVA tenderness. Cardio: regular rate and rhythm, S1, S2 normal, no murmur, click, rub or gallop GI: soft, non-tender; bowel sounds normal; no masses,  no organomegaly Extremities: extremities normal, atraumatic, no cyanosis or edema Neurologic: Alert and oriented X 3, normal strength and tone. Normal symmetric reflexes. Normal coordination and gait  ECOG  PERFORMANCE STATUS: 1 - Symptomatic but completely ambulatory  Blood pressure 121/87, pulse 93, temperature 97.6 F (36.4 C), temperature source Tympanic, resp. rate 18, height 5' 3"  (1.6 m), weight 144 lb 4.8 oz (65.5 kg), SpO2 97 %.  LABORATORY DATA: Lab Results  Component Value Date   WBC 4.3 03/20/2020   HGB 13.7 03/20/2020   HCT 41.4 03/20/2020   MCV 91.8 03/20/2020   PLT 286 03/20/2020      Chemistry      Component Value Date/Time   NA 137 03/20/2020 1125   NA 139 03/20/2017 1038   K 4.3 03/20/2020 1125   K 4.3 03/20/2017 1038   CL 102 03/20/2020 1125   CO2 26 03/20/2020 1125   CO2 27 03/20/2017 1038   BUN 20 03/20/2020 1125   BUN 15.4 03/20/2017 1038   CREATININE 0.71 03/20/2020 1125   CREATININE 0.7 03/20/2017 1038      Component Value Date/Time   CALCIUM 9.5 03/20/2020 1125   CALCIUM 9.8 03/20/2017 1038   ALKPHOS 36 (L) 03/20/2020 1125   ALKPHOS 37 (L) 03/20/2017 1038   AST 27 03/20/2020 1125   AST 25 03/20/2017  1038   ALT 22 03/20/2020 1125   ALT 22 03/20/2017 1038   BILITOT 0.5 03/20/2020 1125   BILITOT 0.42 03/20/2017 1038       RADIOGRAPHIC STUDIES: CT Chest W Contrast  Result Date: 03/20/2020 CLINICAL DATA:  Non-small-cell lung cancer, restaging. Diagnosed in 2016 with metastasis to spine in left shoulder. Chemotherapy radiation therapy completed in 2017 and 2019. Asymptomatic today. EXAM: CT CHEST WITH CONTRAST TECHNIQUE: Multidetector CT imaging of the chest was performed during intravenous contrast administration. CONTRAST:  4m OMNIPAQUE IOHEXOL 300 MG/ML  SOLN COMPARISON:  11/15/2019 FINDINGS: Cardiovascular: Advanced aortic and branch vessel atherosclerosis. Significant stenosis involving the proximal left common carotid artery on 48/2. Tortuous thoracic aorta. Borderline cardiomegaly. Right coronary artery atherosclerosis. No central pulmonary embolism, on this non-dedicated study. Mediastinum/Nodes: A left supraclavicular node measures 7 mm on  11/2 versus 6 mm on the prior exam. Nodes at the left thoracic inlet are small, similar. No mediastinal or hilar adenopathy. The soft tissue in the right hilum described on the prior exam is actually pulmonary parenchymal consolidation described below from prior radiation therapy. This is similar including on 60/2. Lungs/Pleura: Minimal right apical pleural thickening. Mild centrilobular emphysema. Volume loss in the right hemithorax. Right apical pleural-based nodularity at 6 mm on 27/5, similar. Geographic consolidation and architectural distortion involving the medial right upper lung, likely radiation induced, similar. Left upper lobe pulmonary nodule measures 8 x 8 mm on 42/5 versus 7 x 7 mm on the prior exam. Subjectively slightly more well-defined today. 6 mm craniocaudal on coronal image 74 today versus 5 mm on the prior exam (when remeasured). Upper Abdomen: Similar too small to characterize right hepatic lobe lesion is similar. Normal imaged portions of the spleen, stomach, pancreas, gallbladder, adrenal glands, kidneys Musculoskeletal: Mild osteopenia. Sclerosis in the left acromion is similar, likely the site of known metastasis. Multiple posterior right rib nonunited fractures again identified, likely radiation induced. Similar sclerosis involving the posterior aspect of the T2 vertebral body. IMPRESSION: 1. Extensive radiation changes throughout the right hemithorax, without recurrent disease. 2. Suspect mild further enlargement of a left upper lobe pulmonary nodule, suspicious for metastatic disease versus synchronous primary. 3. Similar osseous metastasis. 4. Possible enlargement of a left supraclavicular node which is upper normal sized. Cannot exclude nodal metastasis. Recommend attention on follow-up. 5. Aortic atherosclerosis (ICD10-I70.0), coronary artery atherosclerosis and emphysema (ICD10-J43.9). 6. Resolved right pleural fluid. Electronically Signed   By: KAbigail MiyamotoM.D.   On: 03/20/2020  14:27   ASSESSMENT AND PLAN:  This is a very pleasant 64years old white female with a stage IIIB non-small cell lung cancer, adenocarcinoma with negative EGFR, ALK mutations diagnosed in October 2016.  Molecular studies showed positive ROS 1 She is status post course of concurrent chemoradiation followed by consolidation chemotherapy with carboplatin and Alimta. The patient has been in observation for close to 3 years. She underwent palliative radiotherapy to the left supraclavicular lymph nodes with improvement of her disease. The patient is currently on observation with no concerning complaints except for mild shortness of breath with exertion. She had repeat CT scan of the chest performed recently.  I personally and independently reviewed the scan images and discussed the results with the patient and showed her the images today. Her scan showed no concerning disease progression except for slight increase in the left upper lobe pulmonary nodule which currently measures 8 mm compared to 7 mm 4 months ago.  She also has a slight increase in left supraclavicular  lymph node which increased from 6 mm to 7 mm. I discussed with the patient several options for management of her condition including continuous observation and monitoring as well as repeat CT scan of the chest in 4 months versus referral to radiation oncology for SBRT to the left upper lobe lung nodule versus starting the patient on treatment with Entrectinib orally since her tumor has ROS1 mutation. The patient would like to take some time to think about these options and she will let us know. Regarding the coronary atherosclerosis, she was advised to consult with her primary care physician and have a referral to cardiology for further evaluation. The patient was advised to call immediately if she has any other concerning symptoms in the interval. The patient voices understanding of current disease status and treatment options and is in  agreement with the current care plan. All questions were answered. The patient knows to call the clinic with any problems, questions or concerns. We can certainly see the patient much sooner if necessary.  Disclaimer: This note was dictated with voice recognition software. Similar sounding words can inadvertently be transcribed and may not be corrected upon review.

## 2020-03-24 ENCOUNTER — Telehealth: Payer: Self-pay | Admitting: Internal Medicine

## 2020-03-24 ENCOUNTER — Other Ambulatory Visit: Payer: Self-pay | Admitting: Urology

## 2020-03-24 ENCOUNTER — Telehealth: Payer: Self-pay | Admitting: *Deleted

## 2020-03-24 ENCOUNTER — Telehealth: Payer: Self-pay | Admitting: Urology

## 2020-03-24 ENCOUNTER — Telehealth: Payer: Self-pay | Admitting: Medical Oncology

## 2020-03-24 ENCOUNTER — Other Ambulatory Visit: Payer: Self-pay | Admitting: Internal Medicine

## 2020-03-24 DIAGNOSIS — C3490 Malignant neoplasm of unspecified part of unspecified bronchus or lung: Secondary | ICD-10-CM

## 2020-03-24 DIAGNOSIS — C3411 Malignant neoplasm of upper lobe, right bronchus or lung: Secondary | ICD-10-CM

## 2020-03-24 HISTORY — DX: Malignant neoplasm of unspecified part of unspecified bronchus or lung: C34.90

## 2020-03-24 NOTE — Telephone Encounter (Signed)
Pt questions Message sent to Ashlyn Bruning to call pt about timing of xrt and why a PET scan.

## 2020-03-24 NOTE — Telephone Encounter (Signed)
I received several messages regarding Rose Figueroa and plan of care.  I called her and we talked about what her recent scan showed and her treatment options.  I updated her on her PET scan and that her case will be discussed at cancer conference the following day after the PET.  She verbalized understanding of next steps.  I will notify the thoracic team.

## 2020-03-24 NOTE — Telephone Encounter (Signed)
Called and spoke with patient. Did not want to schedule 69mth f/u appt at this time. Sent msg to MD and RN for them to give her a call to discuss radiation options.

## 2020-03-24 NOTE — Telephone Encounter (Signed)
I attempted to return the patient's call regarding questions about the PET scan and recommendations for radiation now versus later.  There was no answer so I had to leave a message on voicemail requesting that she return my call and let me know if there is a better time to contact her and her preferred contact number.  I will await her return call.  Nicholos Johns, MMS, PA-C [place] [facility]: [phone]  Fax: [phone]

## 2020-03-25 ENCOUNTER — Telehealth: Payer: Medicare HMO

## 2020-03-26 DIAGNOSIS — I7090 Unspecified atherosclerosis: Secondary | ICD-10-CM | POA: Diagnosis not present

## 2020-03-26 DIAGNOSIS — M79671 Pain in right foot: Secondary | ICD-10-CM | POA: Diagnosis not present

## 2020-04-01 ENCOUNTER — Ambulatory Visit (HOSPITAL_COMMUNITY)
Admission: RE | Admit: 2020-04-01 | Discharge: 2020-04-01 | Disposition: A | Discharge status: Home / Self Care | Payer: Medicare HMO | Source: Ambulatory Visit | Attending: Urology | Admitting: Urology

## 2020-04-01 ENCOUNTER — Other Ambulatory Visit: Payer: Self-pay

## 2020-04-01 DIAGNOSIS — R911 Solitary pulmonary nodule: Secondary | ICD-10-CM | POA: Diagnosis not present

## 2020-04-01 DIAGNOSIS — C3490 Malignant neoplasm of unspecified part of unspecified bronchus or lung: Secondary | ICD-10-CM | POA: Diagnosis not present

## 2020-04-01 DIAGNOSIS — R590 Localized enlarged lymph nodes: Secondary | ICD-10-CM | POA: Diagnosis not present

## 2020-04-01 LAB — GLUCOSE, CAPILLARY: Glucose-Capillary: 93 mg/dL (ref 70–99)

## 2020-04-01 MED ORDER — FLUDEOXYGLUCOSE F - 18 (FDG) INJECTION
7.6500 | Freq: Once | INTRAVENOUS | Status: AC
  Administered 2020-04-01: 7.65 via INTRAVENOUS

## 2020-04-02 ENCOUNTER — Other Ambulatory Visit: Payer: Self-pay | Admitting: *Deleted

## 2020-04-02 ENCOUNTER — Other Ambulatory Visit: Payer: Self-pay | Admitting: Nurse Practitioner

## 2020-04-02 DIAGNOSIS — E559 Vitamin D deficiency, unspecified: Secondary | ICD-10-CM

## 2020-04-02 DIAGNOSIS — M810 Age-related osteoporosis without current pathological fracture: Secondary | ICD-10-CM

## 2020-04-03 ENCOUNTER — Telehealth: Payer: Self-pay | Admitting: *Deleted

## 2020-04-03 NOTE — Telephone Encounter (Signed)
I followed up with Rad Onc to see if Ms. Mccoll was called with an update from cancer conference.  I was updated that I could call and update her. I did.  She was thankful for the call and update on treatment plan.

## 2020-04-06 ENCOUNTER — Other Ambulatory Visit: Payer: Self-pay

## 2020-04-06 ENCOUNTER — Ambulatory Visit
Admission: RE | Admit: 2020-04-06 | Discharge: 2020-04-06 | Disposition: A | Discharge status: Home / Self Care | Payer: Medicare HMO | Source: Ambulatory Visit | Attending: Nurse Practitioner | Admitting: Nurse Practitioner

## 2020-04-06 DIAGNOSIS — Z1231 Encounter for screening mammogram for malignant neoplasm of breast: Secondary | ICD-10-CM | POA: Diagnosis not present

## 2020-04-06 DIAGNOSIS — M810 Age-related osteoporosis without current pathological fracture: Secondary | ICD-10-CM

## 2020-04-06 DIAGNOSIS — E559 Vitamin D deficiency, unspecified: Secondary | ICD-10-CM

## 2020-04-08 DIAGNOSIS — Z03818 Encounter for observation for suspected exposure to other biological agents ruled out: Secondary | ICD-10-CM | POA: Diagnosis not present

## 2020-04-08 DIAGNOSIS — R0602 Shortness of breath: Secondary | ICD-10-CM | POA: Diagnosis not present

## 2020-04-08 DIAGNOSIS — R0789 Other chest pain: Secondary | ICD-10-CM | POA: Diagnosis not present

## 2020-04-08 DIAGNOSIS — R5381 Other malaise: Secondary | ICD-10-CM | POA: Diagnosis not present

## 2020-04-10 ENCOUNTER — Ambulatory Visit
Admission: RE | Admit: 2020-04-10 | Discharge: 2020-04-10 | Disposition: A | Discharge status: Home / Self Care | Payer: Medicare HMO | Source: Ambulatory Visit | Attending: Radiation Oncology | Admitting: Radiation Oncology

## 2020-04-10 ENCOUNTER — Other Ambulatory Visit: Payer: Self-pay

## 2020-04-10 ENCOUNTER — Encounter: Payer: Self-pay | Admitting: Urology

## 2020-04-10 ENCOUNTER — Ambulatory Visit
Admission: RE | Admit: 2020-04-10 | Discharge: 2020-04-10 | Disposition: A | Discharge status: Home / Self Care | Payer: Medicare HMO | Source: Ambulatory Visit | Attending: Urology | Admitting: Urology

## 2020-04-10 VITALS — BP 142/93 | HR 88 | Temp 98.4°F | Resp 18 | Ht 63.0 in | Wt 146.4 lb

## 2020-04-10 DIAGNOSIS — Z923 Personal history of irradiation: Secondary | ICD-10-CM | POA: Diagnosis not present

## 2020-04-10 DIAGNOSIS — Z85118 Personal history of other malignant neoplasm of bronchus and lung: Secondary | ICD-10-CM | POA: Diagnosis not present

## 2020-04-10 DIAGNOSIS — Z87891 Personal history of nicotine dependence: Secondary | ICD-10-CM | POA: Diagnosis not present

## 2020-04-10 DIAGNOSIS — C3411 Malignant neoplasm of upper lobe, right bronchus or lung: Secondary | ICD-10-CM | POA: Insufficient documentation

## 2020-04-10 DIAGNOSIS — C770 Secondary and unspecified malignant neoplasm of lymph nodes of head, face and neck: Secondary | ICD-10-CM

## 2020-04-10 DIAGNOSIS — Z510 Encounter for antineoplastic radiation therapy: Secondary | ICD-10-CM | POA: Diagnosis not present

## 2020-04-10 DIAGNOSIS — C3490 Malignant neoplasm of unspecified part of unspecified bronchus or lung: Secondary | ICD-10-CM

## 2020-04-10 DIAGNOSIS — R911 Solitary pulmonary nodule: Secondary | ICD-10-CM | POA: Diagnosis not present

## 2020-04-10 NOTE — Progress Notes (Signed)
Radiation Oncology         [phone] ________________________________  Outpatient Re-Consultation  Name: Rose Figueroa MRN: [id number]  Date of Service: 04/10/2020 DOB: 1956-04-20  CC:Masneri, Adele Barthel, DO  Curt Bears, MD   REFERRING PHYSICIAN: Curt Bears, MD  DIAGNOSIS: 64 y/o female with putative recurrence of NSCLC in the LUL and left supraclavicular lymph node.    ICD-10-CM   1. Metastatic non-small cell lung cancer (HCC)  C34.90   2. 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 female seen at the request of Dr. Julien Nordmann and is well-known to our service.   In summary, she was initially diagnosed with stage IIIB (T2a, N3) non-small cell, adenocarcinoma of the RUL in 04/2015. She received concurrent chemoradiation in November and Rose 2016 followed by 6 cycles of maintenance systemic therapy with carboplatin and alimta. She was under observation until 08/2017 when she developed left neck swelling and was was found to have a left supraclavicular mass. Biopsy on 09/05/17 confirmed metastatic adenocarcinoma. Staging PET scan showed activity in the left supraclavicular region, left scapula, and T2. We met with the patient in 09/2017, and she received palliative radiotherapy to the left chest to include the left scapula and left supraclavicular node as well as the thoracic spine lesion at T2.  She has continued in close observation under the care and direction of Dr. Julien Nordmann since that time.  Her most recent surveillance chest CT on 03/20/2020 demonstrated slight further enlargement of a LUL pulmonary nodule, measuring 8 mm as compared to 7 mm on prior scan from 11/15/19 and left supraclavicular node measuring 7 mm as compared to 6 mm on prior scan.  This prompted a PET scan, which was performed on 04/01/2020 confirming hypermetabolism in the enlarging LUL nodule and left supraclavicular adenopathy as well as  foci of FDG uptake in the left scapula.  There was no evidence of disease recurrence in the RUL, mediastinal/hilar lymphadenopathy, or abdominal/pelvic metastatic disease. Her case and imaging studies were discussed at the recent multi-disciplinary thoracic oncology conference and consensus recommendation was to proceed with SBRT to the enlarging LUL nodule and left [place] node without the need for repeat biopsy.  She has kindly been referred today to discuss potential options for radiotherapy in the management of her disease.  PREVIOUS RADIATION THERAPY: Yes  10/19/2017 - 11/01/2017: 1. Left Chest / 30 Gy in 10 fractions 2. Thoracic Spine / 30 Gy in 10 fractions  06/01/2015-07/16/2015:The primary RUL tumor and involved mediastinal adenopathy were treated to 66 Gy in 33 fractions of 2 Gy.  PAST MEDICAL HISTORY:  Past Medical History:  Diagnosis Date  . Anxiety   . Back pain    right back lateral  . BCC (basal cell carcinoma of skin)   . Colon polyps   . Complication of anesthesia    pt. states that she is difficult to arouse  . Complication of anesthesia    "indigestion and burping " after eating.  . Diverticulosis   . Dysrhythmia    "skip beat in early 20's"  . H/O cold sores   . History of anemia   . Lung cancer ([place])    RUL mass -being tx- radiation and chemo- remains last chemo 01-04-16  . Osteoporosis   . PONV (postoperative nausea and vomiting)   . Rectal bleeding   . right upper lobe lung mass 04/15/2015   malignant tumor found -  10'16-radiation and chemotherapy and remains with chemo at present- Dr. Earlie Server follows.      PAST SURGICAL HISTORY: Past Surgical History:  Procedure Laterality Date  . BREAST EXCISIONAL BIOPSY Right   . BREAST EXCISIONAL BIOPSY Right   . BREAST EXCISIONAL BIOPSY Right   . COLONOSCOPY    . COLONOSCOPY W/ POLYPECTOMY    . COLONOSCOPY WITH PROPOFOL N/A 01/20/2016   Procedure: COLONOSCOPY WITH PROPOFOL;  Surgeon: Arta Silence, MD;  Location:  [facility];  Service: Endoscopy;  Laterality: N/A;  . CYSTECTOMY     right breast x 2  . IR REMOVAL TUN ACCESS W/ PORT W/O FL MOD SED  12/12/2018  . MEDIASTINOSCOPY N/A 05/07/2015   Procedure: MEDIASTINOSCOPY;  Surgeon: Ivin Poot, MD;  Location: [facility];  Service: Thoracic;  Laterality: N/A;  . TONSILLECTOMY    . VIDEO BRONCHOSCOPY WITH ENDOBRONCHIAL ULTRASOUND N/A 05/07/2015   Procedure: VIDEO BRONCHOSCOPY WITH ENDOBRONCHIAL ULTRASOUND;  Surgeon: Ivin Poot, MD;  Location: [facility];  Service: Thoracic;  Laterality: N/A;    FAMILY HISTORY: No family history on file.  SOCIAL HISTORY:  Social History   Socioeconomic History  . Marital status: Married    Spouse name: Not on file  . Number of children: Not on file  . Years of education: Not on file  . Highest education level: Not on file  Occupational History  . Occupation: retired  Tobacco Use  . Smoking status: Former Smoker    Packs/day: 1.00    Years: 5.00    Pack years: 5.00    Types: Cigarettes    Quit date: 07/26/1975    Years since quitting: 44.7  . Smokeless tobacco: Never Used  Substance and Sexual Activity  . Alcohol use: Yes    Alcohol/week: 0.0 standard drinks    Comment: occ-. none now.  . Drug use: No  . Sexual activity: Not on file  Other Topics Concern  . Not on file  Social History Narrative  . Not on file   Social Determinants of Health   Financial Resource Strain:   . Difficulty of Paying Living Expenses: Not on file  Food Insecurity:   . Worried About Charity fundraiser in the Last Year: Not on file  . Ran Out of Food in the Last Year: Not on file  Transportation Needs:   . Lack of Transportation (Medical): Not on file  . Lack of Transportation (Non-Medical): Not on file  Physical Activity:   . Days of Exercise per Week: Not on file  . Minutes of Exercise per Session: Not on file  Stress:   . Feeling of Stress : Not on file  Social Connections:   . Frequency of Communication with  Friends and Family: Not on file  . Frequency of Social Gatherings with Friends and Family: Not on file  . Attends Religious Services: Not on file  . Active Member of Clubs or Organizations: Not on file  . Attends Archivist Meetings: Not on file  . Marital Status: Not on file  Intimate Partner Violence:   . Fear of Current or Ex-Partner: Not on file  . Emotionally Abused: Not on file  . Physically Abused: Not on file  . Sexually Abused: Not on file    ALLERGIES: Patient has no known allergies.  MEDICATIONS:  Current Outpatient Medications  Medication Sig Dispense Refill  . Acetylcysteine (N-ACETYL-L-CYSTEINE) 600 MG CAPS Take 1 capsule by mouth daily.    . Biotin 5000 MCG CAPS Take  10,000 mcg by mouth daily.     . Calcium Carb-Cholecalciferol (CALCIUM 1000 + D PO) Take 3 tablets by mouth 2 (two) times daily. D3 = " 25 mcg"    . L-LYSINE PO Take 2,000 mg by mouth daily.     . Magnesium 400 MG CAPS Take 1 capsule by mouth daily.    . Melatonin 3 MG CAPS Take 1 capsule by mouth at bedtime.    . Multiple Vitamins-Minerals (PRESERVISION/LUTEIN PO) Take 1 capsule by mouth daily.    . Omega-3 Fatty Acids (RA FISH OIL) 1000 MG CPDR Take 1 capsule by mouth 2 (two) times daily.     Marland Kitchen PREBIOTIC PRODUCT PO Take by mouth.    . Probiotic Product (PROBIOTIC DAILY PO) Take 1 capsule by mouth daily.    . Psyllium (REGULOID PO) Take 750 mg by mouth daily.    . Turmeric Curcumin 500 MG CAPS Take 1 each by mouth daily.     . vitamin B-12 (CYANOCOBALAMIN) 1000 MCG tablet Take 1,000 mcg by mouth daily.    . Flaxseed, Linseed, (FLAXSEED OIL) 1000 MG CAPS Take 1 capsule by mouth every evening. (Patient not taking: Reported on 04/10/2020)    . OVER THE COUNTER MEDICATION Take 500 mg by mouth in the morning, at noon, and at bedtime. "Metamucil" (Patient not taking: Reported on 04/10/2020)     No current facility-administered medications for this encounter.    REVIEW OF SYSTEMS:  On review of  systems, the patient reports that she is doing well overall. she denies any chest pain, shortness of breath, cough, fevers, chills, night sweats, unintended weight changes. she denies any bowel or bladder disturbances, and denies abdominal pain, nausea or vomiting. she denies any new musculoskeletal or joint aches or pains. A complete review of systems is obtained and is otherwise negative.    PHYSICAL EXAM:  Wt Readings from Last 3 Encounters:  04/10/20 146 lb 6.4 oz (66.4 kg)  03/23/20 144 lb 4.8 oz (65.5 kg)  11/19/19 147 lb (66.7 kg)   Temp Readings from Last 3 Encounters:  04/10/20 98.4 F (36.9 C)  03/23/20 97.6 F (36.4 C) (Tympanic)  11/19/19 98.5 F (36.9 C) (Temporal)   BP Readings from Last 3 Encounters:  04/10/20 (!) 142/93  03/23/20 121/87  11/19/19 133/86   Pulse Readings from Last 3 Encounters:  04/10/20 88  03/23/20 93  11/19/19 82   Pain Assessment Pain Score: 4  Pain Frequency: Several days a week Pain Loc: Chest/10  In general this is a well appearing caucasian female in no acute distress. She is alert and oriented x4 and appropriate throughout the examination. HEENT reveals that the patient is normocephalic, atraumatic. EOMs are intact. PERRLA. Skin is intact without any evidence of gross lesions.. Cardiopulmonary assessment is negative for acute distress and she exhibits normal effort. The abdomen is soft, non tender, non distended. Lower extremities are negative for pretibial pitting edema, deep calf tenderness, cyanosis or clubbing.   KPS = 100  100 - Normal; no complaints; no evidence of disease. 90   - Able to carry on normal activity; minor signs or symptoms of disease. 80   - Normal activity with effort; some signs or symptoms of disease. 51   - Cares for self; unable to carry on normal activity or to do active work. 60   - Requires occasional assistance, but is able to care for most of his personal needs. 50   - Requires considerable assistance  and frequent  medical care. 44   - Disabled; requires special care and assistance. 39   - Severely disabled; hospital admission is indicated although death not imminent. 78   - Very sick; hospital admission necessary; active supportive treatment necessary. 10   - Moribund; fatal processes progressing rapidly. 0     - Dead  Karnofsky DA, Abelmann Hume, Craver LS and Burchenal [facility] [phone]) The use of the nitrogen mustards in the palliative treatment of carcinoma: with particular reference to bronchogenic carcinoma Cancer 1 634-56  LABORATORY DATA:  Lab Results  Component Value Date   WBC 4.3 03/20/2020   HGB 13.7 03/20/2020   HCT 41.4 03/20/2020   MCV 91.8 03/20/2020   PLT 286 03/20/2020   Lab Results  Component Value Date   NA 137 03/20/2020   K 4.3 03/20/2020   CL 102 03/20/2020   CO2 26 03/20/2020   Lab Results  Component Value Date   ALT 22 03/20/2020   AST 27 03/20/2020   ALKPHOS 36 (L) 03/20/2020   BILITOT 0.5 03/20/2020     RADIOGRAPHY: CT Chest W Contrast  Result Date: 03/20/2020 CLINICAL DATA:  Non-small-cell lung cancer, restaging. Diagnosed in 2016 with metastasis to spine in left shoulder. Chemotherapy radiation therapy completed in 2017 and 2019. Asymptomatic today. EXAM: CT CHEST WITH CONTRAST TECHNIQUE: Multidetector CT imaging of the chest was performed during intravenous contrast administration. CONTRAST:  62m OMNIPAQUE IOHEXOL 300 MG/ML  SOLN COMPARISON:  11/15/2019 FINDINGS: Cardiovascular: Advanced aortic and branch vessel atherosclerosis. Significant stenosis involving the proximal left common carotid artery on 48/2. Tortuous thoracic aorta. Borderline cardiomegaly. Right coronary artery atherosclerosis. No central pulmonary embolism, on this non-dedicated study. Mediastinum/Nodes: A left supraclavicular node measures 7 mm on 11/2 versus 6 mm on the prior exam. Nodes at the left thoracic inlet are small, similar. No mediastinal or hilar adenopathy. The soft tissue in  the right hilum described on the prior exam is actually pulmonary parenchymal consolidation described below from prior radiation therapy. This is similar including on 60/2. Lungs/Pleura: Minimal right apical pleural thickening. Mild centrilobular emphysema. Volume loss in the right hemithorax. Right apical pleural-based nodularity at 6 mm on 27/5, similar. Geographic consolidation and architectural distortion involving the medial right upper lung, likely radiation induced, similar. Left upper lobe pulmonary nodule measures 8 x 8 mm on 42/5 versus 7 x 7 mm on the prior exam. Subjectively slightly more well-defined today. 6 mm craniocaudal on coronal image 74 today versus 5 mm on the prior exam (when remeasured). Upper Abdomen: Similar too small to characterize right hepatic lobe lesion is similar. Normal imaged portions of the spleen, stomach, pancreas, gallbladder, adrenal glands, kidneys Musculoskeletal: Mild osteopenia. Sclerosis in the left acromion is similar, likely the site of known metastasis. Multiple posterior right rib nonunited fractures again identified, likely radiation induced. Similar sclerosis involving the posterior aspect of the T2 vertebral body. IMPRESSION: 1. Extensive radiation changes throughout the right hemithorax, without recurrent disease. 2. Suspect mild further enlargement of a left upper lobe pulmonary nodule, suspicious for metastatic disease versus synchronous primary. 3. Similar osseous metastasis. 4. Possible enlargement of a left supraclavicular node which is upper normal sized. Cannot exclude nodal metastasis. Recommend attention on follow-up. 5. Aortic atherosclerosis (ICD10-I70.0), coronary artery atherosclerosis and emphysema (ICD10-J43.9). 6. Resolved right pleural fluid. Electronically Signed   By: KAbigail MiyamotoM.D.   On: 03/20/2020 14:27   NM PET Image Restag (PS) Skull Base To Thigh  Result Date: 04/01/2020 CLINICAL DATA:  Subsequent treatment strategy for non-small  cell  lung cancer. Enlarging left upper lobe pulmonary nodule. EXAM: NUCLEAR MEDICINE PET SKULL BASE TO THIGH TECHNIQUE: 7.65 mCi F-18 FDG was injected intravenously. Full-ring PET imaging was performed from the skull base to thigh after the radiotracer. CT data was obtained and used for attenuation correction and anatomic localization. Fasting blood glucose: 93 mg/dl COMPARISON:  Chest CT 03/20/2020 and prior PET-CT 09/22/2017 FINDINGS: Mediastinal blood pool activity: SUV max 1.76 Liver activity: SUV max NA NECK: No neck mass or adenopathy. Incidental CT findings: none CHEST: Left supraclavicular hypermetabolic adenopathy is noted. 7 mm node in the left supraclavicular fossa has an SUV max of 9.80. 6 mm left supraclavicular node on image 42/4 has an SUV max of 6.95. 5 mm left subclavicular node on image 45/4 has an SUV max of 4.46. 6 mm left upper lobe lung nodule is hypermetabolic with SUV max of 3.82. This could be a second primary or a metastatic focus. No enlarged or hypermetabolic mediastinal or hilar lymph nodes. Incidental CT findings: Stable radiation changes involving the right hilum and paramediastinal lung. No new pulmonary lesions are identified in the right lung. ABDOMEN/PELVIS: No findings suspicious for abdominal/pelvic metastatic disease. No hepatic or adrenal gland lesions are identified. No lymphadenopathy. Incidental CT findings: none SKELETON: Moderate focus of FDG uptake is noted in the left upper scapular spine with SUV max of 7.21. There is also mild uptake in the acromion with SUV max of 4.29. Mild sclerosis noted on the CT scan. No destructive lytic lesion. Ununited right rib fractures with areas of hypermetabolism likely due to radiation treatment. No other bone lesions are identified. Incidental CT findings: none IMPRESSION: 1. The enlarging left upper lobe lung nodule is hypermetabolic and suspicious for metastatic focus or second primary. 2. Left supraclavicular hypermetabolic adenopathy. 3.  Radiation changes involving the right lung. No findings suspicious for recurrent tumor and no mediastinal/hilar adenopathy. 4. No findings for abdominal/pelvic metastatic disease. 5. Foci of FDG uptake in the left scapula as detailed above, suspicious for metastatic disease. Electronically Signed   By: Marijo Sanes M.D.   On: 04/01/2020 15:34   MM 3D SCREEN BREAST BILATERAL  Result Date: 04/07/2020 CLINICAL DATA:  Screening. EXAM: DIGITAL SCREENING BILATERAL MAMMOGRAM WITH TOMO AND CAD COMPARISON:  Previous exam(s). ACR Breast Density Category b: There are scattered areas of fibroglandular density. FINDINGS: There are no findings suspicious for malignancy. Images were processed with CAD. IMPRESSION: No mammographic evidence of malignancy. A result letter of this screening mammogram will be mailed directly to the patient. RECOMMENDATION: Screening mammogram in one year. (Code:[id number]-01Y) BI-RADS CATEGORY  1: Negative. Electronically Signed   By: Lajean Manes M.D.   On: 04/07/2020 14:58      IMPRESSION/PLAN: 1. 64 y.o. woman with putative, recurrence of NSCLC in the LUL and left supraclavicular lymph node.  Today, we talked to the patient about the findings and workup thus far. We discussed the natural history of recurrent lung cancer and general treatment, highlighting the role of radiotherapy in the management. We discussed the available radiation techniques, and focused on the details and logistics of delivery. She is familiar with the procedures given her prior history of radiation therapy. The recommendation is to proceed with a 10 fraction course of SBRT to the left [place] node concurrent with 3 fractions of SBRT to the LUL nodule. We reviewed the anticipated acute and late sequelae associated with radiation in this setting. The patient was encouraged to ask questions that were answered to her satisfaction.  At the conclusion of our conversation, the patient is interested in moving forward with the  recommended course of SBRT targeting the enlarging left supraclavicular lymph node as well as the left upper lobe nodule.  She appears to have a good understanding of her disease and our treatment recommendations and is comfortable and in agreement with the stated plan.  We will share our discussion with Dr. Earlie Server and move forward with CT simulation/treatment planning today in anticipation of beginning her treatments in the near future.    Nicholos Johns, PA-C    Tyler Pita, MD  [facility] Direct Dial: [phone]  Fax: [phone] [email or web address]  Skype  [email or web address]   This document serves as a record of services personally performed by Tyler Pita, MD and Freeman Caldron, PA-C. It was created on their behalf by Wilburn Mylar, a trained medical scribe. The creation of this record is based on the scribe's personal observations and the provider's statements to them. This document has been checked and approved by the attending provider.

## 2020-04-10 NOTE — Progress Notes (Signed)
Patient in for consult for recurrent non-small cell carcinoma. She is to have radiation to left lung this is her third time for radiation. Patient has had all of her treatments at this facility. Had labs done the end of August and was seen by Dr Julien Nordmann on August 30th. She complains of left chest pain and foot pain she has an appointment with her podiatrist for her foot. She is to have a CT scan for planning after her appointment today.

## 2020-04-10 NOTE — Patient Instructions (Signed)
Coronavirus (COVID-19) Are you at risk?  Are you at risk for the Coronavirus (COVID-19)?  To be considered HIGH RISK for Coronavirus (COVID-19), you have to meet the following criteria:  . Traveled to [place], [place], [place], [place] or [place]; or in the [place] to [place], [place], [place], or [place]; and have fever, cough, and shortness of breath within the last 2 weeks of travel OR . Been in close contact with a person diagnosed with COVID-19 within the last 2 weeks and have fever, cough, and shortness of breath . IF YOU DO NOT MEET THESE CRITERIA, YOU ARE CONSIDERED LOW RISK FOR COVID-19.  What to do if you are HIGH RISK for COVID-19?  . If you are having a medical emergency, call 911. . Seek medical care right away. Before you go to a doctor's office, urgent care or emergency department, call ahead and tell them about your recent travel, contact with someone diagnosed with COVID-19, and your symptoms. You should receive instructions from your physician's office regarding next steps of care.  . When you arrive at healthcare provider, tell the healthcare staff immediately you have returned from visiting [place], [place], [place], [place] or [place]; or traveled in the [place] to [place], [place], [place], or [place]; in the last two weeks or you have been in close contact with a person diagnosed with COVID-19 in the last 2 weeks.   . Tell the health care staff about your symptoms: fever, cough and shortness of breath. . After you have been seen by a medical provider, you will be either: o Tested for (COVID-19) and discharged home on quarantine except to seek medical care if symptoms worsen, and asked to  - Stay home and avoid contact with others until you get your results (4-5 days)  - Avoid travel on public transportation if possible (such as bus, train, or airplane) or o Sent to the Emergency Department by EMS for evaluation, COVID-19 testing, and possible  admission depending on your condition and test results.  What to do if you are LOW RISK for COVID-19?  Reduce your risk of any infection by using the same precautions used for avoiding the common cold or flu:  . Wash your hands often with soap and warm water for at least 20 seconds.  If soap and water are not readily available, use an alcohol-based hand sanitizer with at least 60% alcohol.  . If coughing or sneezing, cover your mouth and nose by coughing or sneezing into the elbow areas of your shirt or coat, into a tissue or into your sleeve (not your hands). . Avoid shaking hands with others and consider head nods or verbal greetings only. . Avoid touching your eyes, nose, or mouth with unwashed hands.  . Avoid close contact with people who are sick. . Avoid places or events with large numbers of people in one location, like concerts or sporting events. . Carefully consider travel plans you have or are making. . If you are planning any travel outside or inside the [place], visit the CDC's Travelers' Health webpage for the latest health notices. . If you have some symptoms but not all symptoms, continue to monitor at home and seek medical attention if your symptoms worsen. . If you are having a medical emergency, call 911.   ADDITIONAL HEALTHCARE OPTIONS FOR PATIENTS   Telehealth / e-Visit: [email or web address]         [facility]: [phone]  [facility]: [phone]                   [facility]: [phone]   

## 2020-04-12 NOTE — Progress Notes (Signed)
  Radiation Oncology         [phone] ________________________________  Name: Rose Figueroa MRN: [id number]  Date: 04/10/2020  DOB: 10/29/1955  STEREOTACTIC BODY RADIOTHERAPY SIMULATION AND TREATMENT PLANNING NOTE    ICD-10-CM   1. Metastatic non-small cell lung cancer (HCC)  C34.90   2. 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 woman with recurrent non-small cell lung cancer.  NARRATIVE:  The patient was brought to the [facility].  Identity was confirmed.  All relevant records and images related to the planned course of therapy were reviewed.  The patient freely provided informed written consent to proceed with treatment after reviewing the details related to the planned course of therapy. The consent form was witnessed and verified by the simulation staff.  Then, the patient was set-up in a stable reproducible  supine position for radiation therapy.  A BodyFix immobilization pillow was fabricated for reproducible positioning.  Then I personally applied the abdominal compression paddle to limit respiratory excursion.  4D respiratoy motion management CT images were obtained.  Surface markings were placed.  The CT images were loaded into the planning software.  Then, using Cine, MIP, and standard views, the internal target volume (ITV) and planning target volumes (PTV) were delinieated, and avoidance structures were contoured.  Treatment planning then occurred.  The radiation prescription was entered and confirmed.  A total of two complex treatment devices were fabricated in the form of the BodyFix immobilization pillow and a neck accuform cushion.  I have requested : 3D Simulation  I have requested a DVH of the following structures: Heart, Lungs, Esophagus, Chest Wall, Brachial Plexus, Major Blood Vessels, and targets.  SPECIAL TREATMENT PROCEDURE:  The planned course of therapy using radiation constitutes a special treatment procedure.  Special care is required in the management of this patient for the following reasons. This treatment constitutes a Special Treatment Procedure for the following reason: [ High dose per fraction requiring special monitoring for increased toxicities of treatment including daily imaging..  The special nature of the planned course of radiotherapy will require increased physician supervision and oversight to ensure patient's safety with optimal treatment outcomes.  RESPIRATORY MOTION MANAGEMENT SIMULATION:  In order to account for effect of respiratory motion on target structures and other organs in the planning and delivery of radiotherapy, this patient underwent respiratory motion management simulation.  To accomplish this, when the patient was brought to the CT simulation planning suite, 4D respiratoy motion management CT images were obtained.  The CT images were loaded into the planning software.  Then, using a variety of tools including Cine, MIP, and standard views, the target volume and planning target volumes (PTV) were delineated.  Avoidance structures were contoured.  Treatment planning then occurred.  Dose volume histograms were generated and reviewed for each of the requested structure.  The resulting plan was carefully reviewed and approved today.  PLAN:  The patient will receive 54 Gy in 3 fractions to the left lung cancer and 30 Gy in 10 fractions to the left supraclavicular nodes, with SIB to the nodes.  ________________________________  Sheral Apley Tammi Klippel, M.D.

## 2020-04-14 ENCOUNTER — Other Ambulatory Visit: Payer: Self-pay | Admitting: Podiatry

## 2020-04-14 ENCOUNTER — Ambulatory Visit (INDEPENDENT_AMBULATORY_CARE_PROVIDER_SITE_OTHER): Payer: Medicare HMO

## 2020-04-14 ENCOUNTER — Ambulatory Visit: Payer: Medicare HMO | Admitting: Podiatry

## 2020-04-14 ENCOUNTER — Encounter: Payer: Self-pay | Admitting: Podiatry

## 2020-04-14 ENCOUNTER — Other Ambulatory Visit: Payer: Self-pay

## 2020-04-14 DIAGNOSIS — M7751 Other enthesopathy of right foot: Secondary | ICD-10-CM

## 2020-04-14 DIAGNOSIS — S92354A Nondisplaced fracture of fifth metatarsal bone, right foot, initial encounter for closed fracture: Secondary | ICD-10-CM

## 2020-04-14 NOTE — Progress Notes (Signed)
She presents today after having not been here for quite some time with a chief complaint of pain to the lateral aspect of her right foot.  She denies any trauma.  States is been going on now for about 2 months.  She states that she is an active walker and it bothers her considerably as it throbs.  Objective: Vital signs are stable alert oriented x3 pulses are palpable.  She has swelling overlying the fifth met base.  She has tenderness on palpation of the fifth met base and on eversion against resistance.  Radiographs taken today demonstrate an avulsion fracture of the fifth met base of the right foot.  No other acute findings.  Assessment: Avulsion fracture possible insertional tendinitis peroneus brevis right.  Plan: At this point I placed her in a compression anklet for the edema and the short cam walker.  I like to follow-up with her in about 2 months for another x-ray.

## 2020-04-15 DIAGNOSIS — Z510 Encounter for antineoplastic radiation therapy: Secondary | ICD-10-CM | POA: Diagnosis not present

## 2020-04-15 DIAGNOSIS — C3411 Malignant neoplasm of upper lobe, right bronchus or lung: Secondary | ICD-10-CM | POA: Diagnosis not present

## 2020-04-15 DIAGNOSIS — C770 Secondary and unspecified malignant neoplasm of lymph nodes of head, face and neck: Secondary | ICD-10-CM | POA: Diagnosis not present

## 2020-04-21 ENCOUNTER — Ambulatory Visit: Payer: Medicare HMO

## 2020-04-21 ENCOUNTER — Ambulatory Visit
Admission: RE | Admit: 2020-04-21 | Discharge: 2020-04-21 | Disposition: A | Discharge status: Home / Self Care | Payer: Medicare HMO | Source: Ambulatory Visit | Attending: Radiation Oncology | Admitting: Radiation Oncology

## 2020-04-21 DIAGNOSIS — Z510 Encounter for antineoplastic radiation therapy: Secondary | ICD-10-CM | POA: Diagnosis not present

## 2020-04-21 DIAGNOSIS — C770 Secondary and unspecified malignant neoplasm of lymph nodes of head, face and neck: Secondary | ICD-10-CM | POA: Diagnosis not present

## 2020-04-21 DIAGNOSIS — C3411 Malignant neoplasm of upper lobe, right bronchus or lung: Secondary | ICD-10-CM | POA: Diagnosis not present

## 2020-04-22 ENCOUNTER — Ambulatory Visit
Admission: RE | Admit: 2020-04-22 | Discharge: 2020-04-22 | Disposition: A | Discharge status: Home / Self Care | Payer: Medicare HMO | Source: Ambulatory Visit | Attending: Radiation Oncology | Admitting: Radiation Oncology

## 2020-04-22 ENCOUNTER — Other Ambulatory Visit: Payer: Self-pay

## 2020-04-22 DIAGNOSIS — Z510 Encounter for antineoplastic radiation therapy: Secondary | ICD-10-CM | POA: Diagnosis not present

## 2020-04-22 DIAGNOSIS — C770 Secondary and unspecified malignant neoplasm of lymph nodes of head, face and neck: Secondary | ICD-10-CM | POA: Diagnosis not present

## 2020-04-22 DIAGNOSIS — C3411 Malignant neoplasm of upper lobe, right bronchus or lung: Secondary | ICD-10-CM | POA: Diagnosis not present

## 2020-04-23 ENCOUNTER — Ambulatory Visit: Payer: Medicare HMO

## 2020-04-23 ENCOUNTER — Other Ambulatory Visit: Payer: Self-pay

## 2020-04-23 ENCOUNTER — Ambulatory Visit
Admission: RE | Admit: 2020-04-23 | Discharge: 2020-04-23 | Disposition: A | Discharge status: Home / Self Care | Payer: Medicare HMO | Source: Ambulatory Visit | Attending: Radiation Oncology | Admitting: Radiation Oncology

## 2020-04-23 DIAGNOSIS — Z510 Encounter for antineoplastic radiation therapy: Secondary | ICD-10-CM | POA: Diagnosis not present

## 2020-04-23 DIAGNOSIS — C770 Secondary and unspecified malignant neoplasm of lymph nodes of head, face and neck: Secondary | ICD-10-CM | POA: Diagnosis not present

## 2020-04-23 DIAGNOSIS — C3411 Malignant neoplasm of upper lobe, right bronchus or lung: Secondary | ICD-10-CM | POA: Diagnosis not present

## 2020-04-24 ENCOUNTER — Other Ambulatory Visit: Payer: Self-pay | Admitting: Cardiology

## 2020-04-24 ENCOUNTER — Other Ambulatory Visit: Payer: Self-pay | Admitting: *Deleted

## 2020-04-24 ENCOUNTER — Ambulatory Visit
Admission: RE | Admit: 2020-04-24 | Discharge: 2020-04-24 | Disposition: A | Discharge status: Home / Self Care | Payer: Medicare HMO | Source: Ambulatory Visit | Attending: Radiation Oncology | Admitting: Radiation Oncology

## 2020-04-24 ENCOUNTER — Ambulatory Visit (INDEPENDENT_AMBULATORY_CARE_PROVIDER_SITE_OTHER): Payer: Medicare HMO | Admitting: Cardiology

## 2020-04-24 ENCOUNTER — Other Ambulatory Visit: Payer: Self-pay

## 2020-04-24 ENCOUNTER — Encounter: Payer: Self-pay | Admitting: *Deleted

## 2020-04-24 VITALS — BP 130/100 | HR 90 | Ht 63.0 in | Wt 148.0 lb

## 2020-04-24 DIAGNOSIS — R072 Precordial pain: Secondary | ICD-10-CM

## 2020-04-24 DIAGNOSIS — R0789 Other chest pain: Secondary | ICD-10-CM

## 2020-04-24 DIAGNOSIS — C770 Secondary and unspecified malignant neoplasm of lymph nodes of head, face and neck: Secondary | ICD-10-CM | POA: Diagnosis not present

## 2020-04-24 DIAGNOSIS — I2510 Atherosclerotic heart disease of native coronary artery without angina pectoris: Secondary | ICD-10-CM

## 2020-04-24 DIAGNOSIS — E782 Mixed hyperlipidemia: Secondary | ICD-10-CM

## 2020-04-24 DIAGNOSIS — C3411 Malignant neoplasm of upper lobe, right bronchus or lung: Secondary | ICD-10-CM | POA: Diagnosis not present

## 2020-04-24 DIAGNOSIS — Z510 Encounter for antineoplastic radiation therapy: Secondary | ICD-10-CM | POA: Diagnosis not present

## 2020-04-24 DIAGNOSIS — R011 Cardiac murmur, unspecified: Secondary | ICD-10-CM

## 2020-04-24 DIAGNOSIS — C3490 Malignant neoplasm of unspecified part of unspecified bronchus or lung: Secondary | ICD-10-CM

## 2020-04-24 HISTORY — DX: Cardiac murmur, unspecified: R01.1

## 2020-04-24 HISTORY — DX: Mixed hyperlipidemia: E78.2

## 2020-04-24 HISTORY — DX: Atherosclerotic heart disease of native coronary artery without angina pectoris: I25.10

## 2020-04-24 HISTORY — DX: Other chest pain: R07.89

## 2020-04-24 MED ORDER — NITROGLYCERIN 0.4 MG SL SUBL: 0.4000 mg | SUBLINGUAL_TABLET | SUBLINGUAL | 6 refills | Status: DC | PRN

## 2020-04-24 NOTE — Patient Instructions (Signed)
Medication Instructions:  Your physician has recommended you make the following change in your medication:   Take Nitroglycerin as needed for chest pain.  *If you need a refill on your cardiac medications before your next appointment, please call your pharmacy*   Lab Work: Your physician recommends that you return for lab work in: the next few days. You need to have labs done when you are fasting.  You can come Monday through Friday 8:30 am to 12:00 pm and 1:15 to 4:30. You do not need to make an appointment as the order has already been placed. The labs you are going to have done are  LFT and Lipids.   If you have labs (blood work) drawn today and your tests are completely normal, you will receive your results only by: Marland Kitchen MyChart Message (if you have MyChart) OR . A paper copy in the mail If you have any lab test that is abnormal or we need to change your treatment, we will call you to review the results.   Testing/Procedures: Your physician has requested that you have an echocardiogram. Echocardiography is a painless test that uses sound waves to create images of your heart. It provides your doctor with information about the size and shape of your heart and how well your heart's chambers and valves are working. This procedure takes approximately one hour. There are no restrictions for this procedure.  Your physician has requested that you have a lexiscan myoview. For further information please visit [email or web address]. Please follow instruction sheet, as given.  The test will take approximately 3 to 4 hours to complete; you may bring reading material.  If someone comes with you to your appointment, they will need to remain in the main lobby due to limited space in the testing area.  How to prepare for your Myocardial Perfusion Test: . Do not eat or drink 3 hours prior to your test, except you may have water. . Do not consume products containing caffeine (regular or decaffeinated) 12 hours  prior to your test. (ex: coffee, chocolate, sodas, tea). . Do bring a list of your current medications with you.  If not listed below, you may take your medications as normal. . Do wear comfortable clothes (no dresses or overalls) and walking shoes, tennis shoes preferred (No heels or open toe shoes are allowed). . Do NOT wear cologne, perfume, aftershave, or lotions (deodorant is allowed). . If these instructions are not followed, your test will have to be rescheduled.    Follow-Up: At [facility], you and your health needs are our priority.  As part of our continuing mission to provide you with exceptional heart care, we have created designated Provider Care Teams.  These Care Teams include your primary Cardiologist (physician) and Advanced Practice Providers (APPs -  Physician Assistants and Nurse Practitioners) who all work together to provide you with the care you need, when you need it.  We recommend signing up for the patient portal called "MyChart".  Sign up information is provided on this After Visit Summary.  MyChart is used to connect with patients for Virtual Visits (Telemedicine).  Patients are able to view lab/test results, encounter notes, upcoming appointments, etc.  Non-urgent messages can be sent to your provider as well.   To learn more about what you can do with MyChart, go to [email or web address].    Your next appointment:   1 month(s)  The format for your next appointment:   In Person  Provider:   Jyl Heinz, MD  Other Instructions Nitroglycerin sublingual tablets What is this medicine? NITROGLYCERIN (nye troe GLI ser in) is a type of vasodilator. It relaxes blood vessels, increasing the blood and oxygen supply to your heart. This medicine is used to relieve chest pain caused by angina. It is also used to prevent chest pain before activities like climbing stairs, going outdoors in cold weather, or sexual activity. This medicine may be used for other  purposes; ask your health care provider or pharmacist if you have questions. COMMON BRAND NAME(S): Nitroquick, Nitrostat, Nitrotab What should I tell my health care provider before I take this medicine? They need to know if you have any of these conditions:  anemia  head injury, recent stroke, or bleeding in the brain  liver disease  previous heart attack  an unusual or allergic reaction to nitroglycerin, other medicines, foods, dyes, or preservatives  pregnant or trying to get pregnant  breast-feeding How should I use this medicine? Take this medicine by mouth as needed. At the first sign of an angina attack (chest pain or tightness) place one tablet under your tongue. You can also take this medicine 5 to 10 minutes before an event likely to produce chest pain. Follow the directions on the prescription label. Let the tablet dissolve under the tongue. Do not swallow whole. Replace the dose if you accidentally swallow it. It will help if your mouth is not dry. Saliva around the tablet will help it to dissolve more quickly. Do not eat or drink, smoke or chew tobacco while a tablet is dissolving. If you are not better within 5 minutes after taking ONE dose of nitroglycerin, call 9-1-1 immediately to seek emergency medical care. Do not take more than 3 nitroglycerin tablets over 15 minutes. If you take this medicine often to relieve symptoms of angina, your doctor or health care professional may provide you with different instructions to manage your symptoms. If symptoms do not go away after following these instructions, it is important to call 9-1-1 immediately. Do not take more than 3 nitroglycerin tablets over 15 minutes. Talk to your pediatrician regarding the use of this medicine in children. Special care may be needed. Overdosage: If you think you have taken too much of this medicine contact a poison control center or emergency room at once. NOTE: This medicine is only for you. Do not share  this medicine with others. What if I miss a dose? This does not apply. This medicine is only used as needed. What may interact with this medicine? Do not take this medicine with any of the following medications:  certain migraine medicines like ergotamine and dihydroergotamine (DHE)  medicines used to treat erectile dysfunction like sildenafil, tadalafil, and vardenafil  riociguat This medicine may also interact with the following medications:  alteplase  aspirin  heparin  medicines for high blood pressure  medicines for mental depression  other medicines used to treat angina  phenothiazines like chlorpromazine, mesoridazine, prochlorperazine, thioridazine This list may not describe all possible interactions. Give your health care provider a list of all the medicines, herbs, non-prescription drugs, or dietary supplements you use. Also tell them if you smoke, drink alcohol, or use illegal drugs. Some items may interact with your medicine. What should I watch for while using this medicine? Tell your doctor or health care professional if you feel your medicine is no longer working. Keep this medicine with you at all times. Sit or lie down when you take your medicine to prevent falling if you feel dizzy  or faint after using it. Try to remain calm. This will help you to feel better faster. If you feel dizzy, take several deep breaths and lie down with your feet propped up, or bend forward with your head resting between your knees. You may get drowsy or dizzy. Do not drive, use machinery, or do anything that needs mental alertness until you know how this drug affects you. Do not stand or sit up quickly, especially if you are an older patient. This reduces the risk of dizzy or fainting spells. Alcohol can make you more drowsy and dizzy. Avoid alcoholic drinks. Do not treat yourself for coughs, colds, or pain while you are taking this medicine without asking your doctor or health care  professional for advice. Some ingredients may increase your blood pressure. What side effects may I notice from receiving this medicine? Side effects that you should report to your doctor or health care professional as soon as possible:  blurred vision  dry mouth  skin rash  sweating  the feeling of extreme pressure in the head  unusually weak or tired Side effects that usually do not require medical attention (report to your doctor or health care professional if they continue or are bothersome):  flushing of the face or neck  headache  irregular heartbeat, palpitations  nausea, vomiting This list may not describe all possible side effects. Call your doctor for medical advice about side effects. You may report side effects to FDA at 1-800-FDA-1088. Where should I keep my medicine? Keep out of the reach of children. Store at room temperature between 20 and 25 degrees C (68 and 77 degrees F). Store in [facility] [employer]. Protect from light and moisture. Keep tightly closed. Throw away any unused medicine after the expiration date. NOTE: This sheet is a summary. It may not cover all possible information. If you have questions about this medicine, talk to your doctor, pharmacist, or health care provider.  2020 Elsevier/Gold Standard (2013-05-09 17:57:36)   Cardiac Nuclear Scan A cardiac nuclear scan is a test that is done to check the flow of blood to your heart. It is done when you are resting and when you are exercising. The test looks for problems such as:  Not enough blood reaching a portion of the heart.  The heart muscle not working as it should. You may need this test if:  You have heart disease.  You have had lab results that are not normal.  You have had heart surgery or a balloon procedure to open up blocked arteries (angioplasty).  You have chest pain.  You have shortness of breath. In this test, a special dye (tracer) is put into your bloodstream. The tracer  will travel to your heart. A camera will then take pictures of your heart to see how the tracer moves through your heart. This test is usually done at a hospital and takes 2-4 hours. Tell a doctor about:  Any allergies you have.  All medicines you are taking, including vitamins, herbs, eye drops, creams, and over-the-counter medicines.  Any problems you or family members have had with anesthetic medicines.  Any blood disorders you have.  Any surgeries you have had.  Any medical conditions you have.  Whether you are pregnant or may be pregnant. What are the risks? Generally, this is a safe test. However, problems may occur, such as:  Serious chest pain and heart attack. This is only a risk if the stress portion of the test is done.  Rapid  heartbeat.  A feeling of warmth in your chest. This feeling usually does not last long.  Allergic reaction to the tracer. What happens before the test?  Ask your doctor about changing or stopping your normal medicines. This is important.  Follow instructions from your doctor about what you cannot eat or drink.  Remove your jewelry on the day of the test. What happens during the test?  An IV tube will be inserted into one of your veins.  Your doctor will give you a small amount of tracer through the IV tube.  You will wait for 20-40 minutes while the tracer moves through your bloodstream.  Your heart will be monitored with an electrocardiogram (ECG).  You will lie down on an exam table.  Pictures of your heart will be taken for about 15-20 minutes.  You may also have a stress test. For this test, one of these things may be done: ? You will be asked to exercise on a treadmill or a stationary bike. ? You will be given medicines that will make your heart work harder. This is done if you are unable to exercise.  When blood flow to your heart has peaked, a tracer will again be given through the IV tube.  After 20-40 minutes, you will  get back on the exam table. More pictures will be taken of your heart.  Depending on the tracer that is used, more pictures may need to be taken 3-4 hours later.  Your IV tube will be removed when the test is over. The test may vary among doctors and hospitals. What happens after the test?  Ask your doctor: ? Whether you can return to your normal schedule, including diet, activities, and medicines. ? Whether you should drink more fluids. This will help to remove the tracer from your body. Drink enough fluid to keep your pee (urine) pale yellow.  Ask your doctor, or the department that is doing the test: ? When will my results be ready? ? How will I get my results? Summary  A cardiac nuclear scan is a test that is done to check the flow of blood to your heart.  Tell your doctor whether you are pregnant or may be pregnant.  Before the test, ask your doctor about changing or stopping your normal medicines. This is important.  Ask your doctor whether you can return to your normal activities. You may be asked to drink more fluids. This information is not intended to replace advice given to you by your health care provider. Make sure you discuss any questions you have with your health care provider. Document Revised: 10/31/2018 Document Reviewed: 12/25/2017 Elsevier Patient Education  [place].   Echocardiogram An echocardiogram is a procedure that uses painless sound waves (ultrasound) to produce an image of the heart. Images from an echocardiogram can provide important information about:  Signs of coronary artery disease (CAD).  Aneurysm detection. An aneurysm is a weak or damaged part of an artery wall that bulges out from the normal force of blood pumping through the body.  Heart size and shape. Changes in the size or shape of the heart can be associated with certain conditions, including heart failure, aneurysm, and CAD.  Heart muscle function.  Heart valve  function.  Signs of a past heart attack.  Fluid buildup around the heart.  Thickening of the heart muscle.  A tumor or infectious growth around the heart valves. Tell a health care provider about:  Any allergies you  have.  All medicines you are taking, including vitamins, herbs, eye drops, creams, and over-the-counter medicines.  Any blood disorders you have.  Any surgeries you have had.  Any medical conditions you have.  Whether you are pregnant or may be pregnant. What are the risks? Generally, this is a safe procedure. However, problems may occur, including:  Allergic reaction to dye (contrast) that may be used during the procedure. What happens before the procedure? No specific preparation is needed. You may eat and drink normally. What happens during the procedure?   An IV tube may be inserted into one of your veins.  You may receive contrast through this tube. A contrast is an injection that improves the quality of the pictures from your heart.  A gel will be applied to your chest.  A wand-like tool (transducer) will be moved over your chest. The gel will help to transmit the sound waves from the transducer.  The sound waves will harmlessly bounce off of your heart to allow the heart images to be captured in real-time motion. The images will be recorded on a computer. The procedure may vary among health care providers and hospitals. What happens after the procedure?  You may return to your normal, everyday life, including diet, activities, and medicines, unless your health care provider tells you not to do that. Summary  An echocardiogram is a procedure that uses painless sound waves (ultrasound) to produce an image of the heart.  Images from an echocardiogram can provide important information about the size and shape of your heart, heart muscle function, heart valve function, and fluid buildup around your heart.  You do not need to do anything to prepare  before this procedure. You may eat and drink normally.  After the echocardiogram is completed, you may return to your normal, everyday life, unless your health care provider tells you not to do that. This information is not intended to replace advice given to you by your health care provider. Make sure you discuss any questions you have with your health care provider. Document Revised: 11/01/2018 Document Reviewed: 08/13/2016 Elsevier Patient Education  [place].

## 2020-04-24 NOTE — Progress Notes (Signed)
Cardiology Office Note:    Date:  04/24/2020   ID:  Rose Figueroa, DOB 08/13/1955, MRN [id number]  PCP:  Lyman Bishop, DO  Cardiologist:  Jenean Lindau, MD   Referring MD: Lyman Bishop, DO    ASSESSMENT:    1. Cancer of upper lobe of right lung ([place])   2. Atherosclerosis of native coronary artery of native heart without angina pectoris   3. Mixed dyslipidemia   4. Cardiac murmur   5. Chest discomfort    PLAN:    In order of problems listed above:  1. Coronary calcifications found on CT scan: Secondary prevention stressed with the patient.  Importance of compliance with diet medication stressed and she vocalized understanding.  Because of her chest discomfort we will do a Lexiscan sestamibi.  This is atypical for coronary etiology however I would like to evaluated in view of these findings. 2. Mixed dyslipidemia: We will check her lipids level to see if she qualifies for statin therapy especially in view of this new findings.  Also would like to get a liver check. 3. Cardiac murmur: Echocardiogram will be done to assess murmur on auscultation. 4. She knows to go to the nearest emergency room for any concerning symptoms. 5. Patient will be seen in follow-up appointment in 6 months or earlier if the patient has any concerns.  Sublingual nitroglycerin prescription was sent, its protocol and 911 protocol explained and the patient vocalized understanding questions were answered to the patient's satisfaction.   Medication Adjustments/Labs and Tests Ordered: Current medicines are reviewed at length with the patient today.  Concerns regarding medicines are outlined above.  No orders of the defined types were placed in this encounter.  No orders of the defined types were placed in this encounter.    History of Present Illness:    Rose Figueroa is a 64 y.o. female who is being seen today for the evaluation of coronary calcifications on CT scan at the request of  Crissie Sickles M, DO.  Patient is a pleasant 64 year old female.  She has past medical history of lung cancer and has had multiple radiations for the same.  She was noted to have coronary calcifications and sent here for evaluation.  She is also injured her right foot for which reason she is wearing her shoe.  She leads a sedentary lifestyle.  This coronary calcifications are of concern to her.  At the time of my evaluation, the patient is alert awake oriented and in no distress.  She wanted to be evaluated.  She occasionally has chest discomfort not related to any exertion.  At the time of my evaluation, the patient is alert awake oriented and in no distress.  She tells me that the past several months have been difficult for her because of her cancer diagnosis, recurrence death of her parents several months ago and a recent death of her dog.  Past Medical History:  Diagnosis Date  . Anxiety   . Back pain    right back lateral  . BCC (basal cell carcinoma of skin)   . Bone metastasis ([place]) 10/04/2017  . Cancer of upper lobe of right lung ([place]) 05/19/2015  . Colon polyps   . Complication of anesthesia    pt. states that she is difficult to arouse  . Diverticulosis   . Dysrhythmia    "skip beat in early 20's"  . Encounter for antineoplastic chemotherapy 06/08/2015  . Generalized rash 06/24/2015  . H/O cold sores   .  History of anemia   . Lung cancer ([place])    RUL mass -being tx- radiation and chemo- remains last chemo 01-04-16  . 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 Metastatic non-small cell lung cancer ([place]) 03/24/2020  . Oral lesion 06/24/2015  . Osteoporosis   . PONV (postoperative nausea and vomiting)   . Port catheter in place 11/20/2015  . Rectal bleeding   . right upper lobe lung mass 04/15/2015   malignant tumor found - 10'16-radiation and chemotherapy and remains with chemo at present- Dr. Earlie Server follows.    Past Surgical History:  Procedure Laterality  Date  . BREAST EXCISIONAL BIOPSY Right   . BREAST EXCISIONAL BIOPSY Right   . BREAST EXCISIONAL BIOPSY Right   . COLONOSCOPY    . COLONOSCOPY W/ POLYPECTOMY    . COLONOSCOPY WITH PROPOFOL N/A 01/20/2016   Procedure: COLONOSCOPY WITH PROPOFOL;  Surgeon: Arta Silence, MD;  Location: [facility];  Service: Endoscopy;  Laterality: N/A;  . CYSTECTOMY     right breast x 2  . IR REMOVAL TUN ACCESS W/ PORT W/O FL MOD SED  12/12/2018  . MEDIASTINOSCOPY N/A 05/07/2015   Procedure: MEDIASTINOSCOPY;  Surgeon: Ivin Poot, MD;  Location[place];  Service: Thoracic;  Laterality: N/A;  . TONSILLECTOMY    . VIDEO BRONCHOSCOPY WITH ENDOBRONCHIAL ULTRASOUND N/A 05/07/2015   Procedure: VIDEO BRONCHOSCOPY WITH ENDOBRONCHIAL ULTRASOUND;  Surgeon: Ivin Poot, MD;  Location: [facility] OR;  Service: Thoracic;  Laterality: N/A;    Current Medications: Current Meds  Medication Sig  . Acetylcysteine (N-ACETYL-L-CYSTEINE) 600 MG CAPS Take 1 capsule by mouth daily.  . Biotin 5000 MCG CAPS Take 10,000 mcg by mouth daily.   . Calcium Carb-Cholecalciferol (CALCIUM 1000 + D PO) Take 3 tablets by mouth 2 (two) times daily. D3 = " 25 mcg"  . L-LYSINE PO Take 2,000 mg by mouth daily.   . Magnesium 400 MG CAPS Take 1 capsule by mouth daily.  . Melatonin 3 MG CAPS Take 1 capsule by mouth at bedtime.  . Multiple Vitamins-Minerals (PRESERVISION/LUTEIN PO) Take 1 capsule by mouth daily.  . Omega-3 Fatty Acids (RA FISH OIL) 1000 MG CPDR Take 1 capsule by mouth daily.   Marland Kitchen PREBIOTIC PRODUCT PO Take by mouth.  . Probiotic Product (PROBIOTIC DAILY PO) Take 1 capsule by mouth daily.  . Psyllium (REGULOID PO) Take 750 mg by mouth daily.  . Turmeric Curcumin 500 MG CAPS Take 1 each by mouth daily.   . vitamin B-12 (CYANOCOBALAMIN) 1000 MCG tablet Take 1,000 mcg by mouth daily.     Allergies:   Patient has no known allergies.   Social History   Socioeconomic History  . Marital status: Married    Spouse name: Not on file    . Number of children: Not on file  . Years of education: Not on file  . Highest education level: Not on file  Occupational History  . Occupation: retired  Tobacco Use  . Smoking status: Former Smoker    Packs/day: 1.00    Years: 5.00    Pack years: 5.00    Types: Cigarettes    Quit date: 07/26/1975    Years since quitting: 44.7  . Smokeless tobacco: Never Used  Substance and Sexual Activity  . Alcohol use: Yes    Alcohol/week: 0.0 standard drinks    Comment: occ-. none now.  . Drug use: No  . Sexual activity: Not on file  Other Topics Concern  . Not on  file  Social History Narrative  . Not on file   Social Determinants of Health   Financial Resource Strain:   . Difficulty of Paying Living Expenses: Not on file  Food Insecurity:   . Worried About Charity fundraiser in the Last Year: Not on file  . Ran Out of Food in the Last Year: Not on file  Transportation Needs:   . Lack of Transportation (Medical): Not on file  . Lack of Transportation (Non-Medical): Not on file  Physical Activity:   . Days of Exercise per Week: Not on file  . Minutes of Exercise per Session: Not on file  Stress:   . Feeling of Stress : Not on file  Social Connections:   . Frequency of Communication with Friends and Family: Not on file  . Frequency of Social Gatherings with Friends and Family: Not on file  . Attends Religious Services: Not on file  . Active Member of Clubs or Organizations: Not on file  . Attends Archivist Meetings: Not on file  . Marital Status: Not on file     Family History: The patient's family history is not on file.  ROS:   Please see the history of present illness.    All other systems reviewed and are negative.  EKGs/Labs/Other Studies Reviewed:    The following studies were reviewed today: I discussed my findings with the patient at length.  EKG reveals sinus rhythm and nonspecific ST changes.   Recent Labs: 03/20/2020: ALT 22; BUN 20; Creatinine  0.71; Hemoglobin 13.7; Platelet Count 286; Potassium 4.3; Sodium 137  Recent Lipid Panel No results found for: CHOL, TRIG, HDL, CHOLHDL, VLDL, LDLCALC, LDLDIRECT  Physical Exam:    VS:  BP (!) 130/100 (BP Location: Right Arm, Patient Position: Sitting, Cuff Size: Normal)   Pulse 90   Ht 5\' 3"  (1.6 m)   Wt 148 lb (67.1 kg)   SpO2 98%   BMI 26.22 kg/m     Wt Readings from Last 3 Encounters:  04/24/20 148 lb (67.1 kg)  04/10/20 146 lb 6.4 oz (66.4 kg)  03/23/20 144 lb 4.8 oz (65.5 kg)     GEN: Patient is in no acute distress HEENT: Normal NECK: No JVD; No carotid bruits LYMPHATICS: No lymphadenopathy CARDIAC: S1 S2 regular, 2/6 systolic murmur at the apex. RESPIRATORY:  Clear to auscultation without rales, wheezing or rhonchi  ABDOMEN: Soft, non-tender, non-distended MUSCULOSKELETAL:  No edema; No deformity  SKIN: Warm and dry NEUROLOGIC:  Alert and oriented x 3 PSYCHIATRIC:  Normal affect    Signed, Jenean Lindau, MD  04/24/2020 3:26 PM    [facility]

## 2020-04-27 ENCOUNTER — Ambulatory Visit
Admission: RE | Admit: 2020-04-27 | Discharge: 2020-04-27 | Disposition: A | Discharge status: Home / Self Care | Payer: Medicare HMO | Source: Ambulatory Visit | Attending: Radiation Oncology | Admitting: Radiation Oncology

## 2020-04-27 DIAGNOSIS — C770 Secondary and unspecified malignant neoplasm of lymph nodes of head, face and neck: Secondary | ICD-10-CM | POA: Diagnosis not present

## 2020-04-27 DIAGNOSIS — I2510 Atherosclerotic heart disease of native coronary artery without angina pectoris: Secondary | ICD-10-CM | POA: Diagnosis not present

## 2020-04-27 DIAGNOSIS — E782 Mixed hyperlipidemia: Secondary | ICD-10-CM | POA: Diagnosis not present

## 2020-04-27 DIAGNOSIS — C3411 Malignant neoplasm of upper lobe, right bronchus or lung: Secondary | ICD-10-CM | POA: Diagnosis not present

## 2020-04-27 DIAGNOSIS — Z510 Encounter for antineoplastic radiation therapy: Secondary | ICD-10-CM | POA: Diagnosis not present

## 2020-04-28 ENCOUNTER — Ambulatory Visit
Admission: RE | Admit: 2020-04-28 | Discharge: 2020-04-28 | Disposition: A | Discharge status: Home / Self Care | Payer: Medicare HMO | Source: Ambulatory Visit | Attending: Radiation Oncology | Admitting: Radiation Oncology

## 2020-04-28 ENCOUNTER — Other Ambulatory Visit: Payer: Self-pay

## 2020-04-28 ENCOUNTER — Ambulatory Visit: Payer: Medicare HMO

## 2020-04-28 DIAGNOSIS — C770 Secondary and unspecified malignant neoplasm of lymph nodes of head, face and neck: Secondary | ICD-10-CM | POA: Diagnosis not present

## 2020-04-28 LAB — LIPID PANEL
Chol/HDL Ratio: 2.9 ratio (ref 0.0–4.4)
Cholesterol, Total: 221 mg/dL — ABNORMAL HIGH (ref 100–199)
HDL: 75 mg/dL (ref >39)
LDL Chol Calc (NIH): 136 mg/dL — ABNORMAL HIGH (ref 0–99)
Triglycerides: 60 mg/dL (ref 0–149)
VLDL Cholesterol Cal: 10 mg/dL (ref 5–40)

## 2020-04-28 LAB — HEPATIC FUNCTION PANEL
ALT: 17 IU/L (ref 0–32)
AST: 22 IU/L (ref 0–40)
Albumin: 4.5 g/dL (ref 3.8–4.8)
Alkaline Phosphatase: 50 IU/L (ref 44–121)
Bilirubin Total: 0.4 mg/dL (ref 0.0–1.2)
Bilirubin, Direct: 0.11 mg/dL (ref 0.00–0.40)
Total Protein: 6.6 g/dL (ref 6.0–8.5)

## 2020-04-28 MED ORDER — ATORVASTATIN CALCIUM 10 MG PO TABS: 10.0000 mg | ORAL_TABLET | Freq: Every day | ORAL | 3 refills | Status: DC

## 2020-04-28 NOTE — Addendum Note (Signed)
Addended by: Truddie Hidden on: 04/28/2020 11:27 AM   Modules accepted: Orders

## 2020-04-29 ENCOUNTER — Ambulatory Visit
Admission: RE | Admit: 2020-04-29 | Discharge: 2020-04-29 | Disposition: A | Discharge status: Home / Self Care | Payer: Medicare HMO | Source: Ambulatory Visit | Attending: Radiation Oncology | Admitting: Radiation Oncology

## 2020-04-29 DIAGNOSIS — C770 Secondary and unspecified malignant neoplasm of lymph nodes of head, face and neck: Secondary | ICD-10-CM | POA: Diagnosis not present

## 2020-04-29 DIAGNOSIS — C3411 Malignant neoplasm of upper lobe, right bronchus or lung: Secondary | ICD-10-CM | POA: Diagnosis not present

## 2020-04-29 DIAGNOSIS — Z510 Encounter for antineoplastic radiation therapy: Secondary | ICD-10-CM | POA: Diagnosis not present

## 2020-04-30 ENCOUNTER — Ambulatory Visit
Admission: RE | Admit: 2020-04-30 | Discharge: 2020-04-30 | Disposition: A | Discharge status: Home / Self Care | Payer: Medicare HMO | Source: Ambulatory Visit | Attending: Radiation Oncology | Admitting: Radiation Oncology

## 2020-04-30 DIAGNOSIS — C3411 Malignant neoplasm of upper lobe, right bronchus or lung: Secondary | ICD-10-CM | POA: Diagnosis not present

## 2020-04-30 DIAGNOSIS — C770 Secondary and unspecified malignant neoplasm of lymph nodes of head, face and neck: Secondary | ICD-10-CM | POA: Diagnosis not present

## 2020-04-30 DIAGNOSIS — Z510 Encounter for antineoplastic radiation therapy: Secondary | ICD-10-CM | POA: Diagnosis not present

## 2020-05-01 ENCOUNTER — Other Ambulatory Visit: Payer: Self-pay | Admitting: Radiation Oncology

## 2020-05-01 ENCOUNTER — Ambulatory Visit
Admission: RE | Admit: 2020-05-01 | Discharge: 2020-05-01 | Disposition: A | Discharge status: Home / Self Care | Payer: Medicare HMO | Source: Ambulatory Visit | Attending: Radiation Oncology | Admitting: Radiation Oncology

## 2020-05-01 DIAGNOSIS — C770 Secondary and unspecified malignant neoplasm of lymph nodes of head, face and neck: Secondary | ICD-10-CM | POA: Diagnosis not present

## 2020-05-01 DIAGNOSIS — C3411 Malignant neoplasm of upper lobe, right bronchus or lung: Secondary | ICD-10-CM | POA: Diagnosis not present

## 2020-05-01 DIAGNOSIS — Z510 Encounter for antineoplastic radiation therapy: Secondary | ICD-10-CM | POA: Diagnosis not present

## 2020-05-01 MED ORDER — SUCRALFATE 1 G PO TABS: 1.0000 g | ORAL_TABLET | Freq: Three times a day (TID) | ORAL | 2 refills | Status: DC

## 2020-05-04 ENCOUNTER — Ambulatory Visit
Admission: RE | Admit: 2020-05-04 | Discharge: 2020-05-04 | Disposition: A | Discharge status: Home / Self Care | Payer: Medicare HMO | Source: Ambulatory Visit | Attending: Radiation Oncology | Admitting: Radiation Oncology

## 2020-05-04 ENCOUNTER — Encounter: Payer: Self-pay | Admitting: Urology

## 2020-05-04 ENCOUNTER — Other Ambulatory Visit: Payer: Self-pay

## 2020-05-04 DIAGNOSIS — C770 Secondary and unspecified malignant neoplasm of lymph nodes of head, face and neck: Secondary | ICD-10-CM | POA: Diagnosis not present

## 2020-05-04 DIAGNOSIS — C3411 Malignant neoplasm of upper lobe, right bronchus or lung: Secondary | ICD-10-CM | POA: Diagnosis not present

## 2020-05-04 DIAGNOSIS — Z510 Encounter for antineoplastic radiation therapy: Secondary | ICD-10-CM | POA: Diagnosis not present

## 2020-05-07 ENCOUNTER — Telehealth: Payer: Self-pay | Admitting: *Deleted

## 2020-05-07 NOTE — Telephone Encounter (Signed)
Called patient to inform that telephone fu has been moved from 06-10-20 to 06-17-20 due to a change in the provider's schedule, lvm for a return call

## 2020-05-14 ENCOUNTER — Telehealth (HOSPITAL_COMMUNITY): Payer: Self-pay

## 2020-05-14 NOTE — Telephone Encounter (Signed)
Detailed instructions left on the patient's answering machine. Asked to call back with any questions. S.Zaevion Parke EMTP 

## 2020-05-19 ENCOUNTER — Other Ambulatory Visit: Payer: Self-pay

## 2020-05-19 ENCOUNTER — Telehealth: Payer: Self-pay

## 2020-05-19 ENCOUNTER — Ambulatory Visit (HOSPITAL_BASED_OUTPATIENT_CLINIC_OR_DEPARTMENT_OTHER): Payer: Medicare HMO

## 2020-05-19 ENCOUNTER — Ambulatory Visit (HOSPITAL_COMMUNITY): Payer: Medicare HMO | Attending: Cardiology

## 2020-05-19 VITALS — Ht 63.0 in | Wt 148.0 lb

## 2020-05-19 DIAGNOSIS — R011 Cardiac murmur, unspecified: Secondary | ICD-10-CM | POA: Insufficient documentation

## 2020-05-19 DIAGNOSIS — R072 Precordial pain: Secondary | ICD-10-CM

## 2020-05-19 DIAGNOSIS — I2510 Atherosclerotic heart disease of native coronary artery without angina pectoris: Secondary | ICD-10-CM | POA: Insufficient documentation

## 2020-05-19 DIAGNOSIS — R0789 Other chest pain: Secondary | ICD-10-CM

## 2020-05-19 DIAGNOSIS — R69 Illness, unspecified: Secondary | ICD-10-CM | POA: Diagnosis not present

## 2020-05-19 LAB — ECHOCARDIOGRAM COMPLETE
Area-P 1/2: 6.48 cm2
Height: 63 in
S' Lateral: 2.3 cm
Weight: 2368 oz

## 2020-05-19 MED ORDER — TECHNETIUM TC 99M TETROFOSMIN IV KIT
10.1000 | PACK | Freq: Once | INTRAVENOUS | Status: AC | PRN
  Administered 2020-05-19: 10.1 via INTRAVENOUS
  Filled 2020-05-19: qty 11

## 2020-05-19 NOTE — Telephone Encounter (Signed)
Spoke with patient regarding results and recommendation.  Patient verbalizes understanding and is agreeable to plan of care. Advised patient to call back with any issues or concerns.  

## 2020-05-19 NOTE — Telephone Encounter (Signed)
-----   Message from Jenean Lindau, MD sent at 05/19/2020 10:54 AM EDT ----- The results of the study is unremarkable. Please inform patient. I will discuss in detail at next appointment. Cc  primary care/referring physician Jenean Lindau, MD 05/19/2020 10:54 AM

## 2020-05-19 NOTE — Telephone Encounter (Signed)
Patient is returning call.  °

## 2020-05-19 NOTE — Telephone Encounter (Signed)
Left message on patients voicemail to please return our call.   

## 2020-05-19 NOTE — Telephone Encounter (Signed)
Patient returned your call.

## 2020-05-20 ENCOUNTER — Ambulatory Visit (HOSPITAL_COMMUNITY): Payer: Medicare HMO | Attending: Cardiovascular Disease

## 2020-05-20 DIAGNOSIS — L309 Dermatitis, unspecified: Secondary | ICD-10-CM | POA: Diagnosis not present

## 2020-05-20 DIAGNOSIS — I2510 Atherosclerotic heart disease of native coronary artery without angina pectoris: Secondary | ICD-10-CM | POA: Diagnosis present

## 2020-05-20 DIAGNOSIS — R0789 Other chest pain: Secondary | ICD-10-CM | POA: Insufficient documentation

## 2020-05-20 DIAGNOSIS — Z85828 Personal history of other malignant neoplasm of skin: Secondary | ICD-10-CM | POA: Diagnosis not present

## 2020-05-20 LAB — MYOCARDIAL PERFUSION IMAGING
LV dias vol: 53 mL (ref 46–106)
LV sys vol: 12 mL
Peak HR: 126 {beats}/min
Rest HR: 96 {beats}/min
SDS: 1
SRS: 0
SSS: 1
TID: 1.04

## 2020-05-20 MED ORDER — REGADENOSON 0.4 MG/5ML IV SOLN
0.4000 mg | Freq: Once | INTRAVENOUS | Status: AC
  Administered 2020-05-20: 0.4 mg via INTRAVENOUS

## 2020-05-20 MED ORDER — TECHNETIUM TC 99M TETROFOSMIN IV KIT
30.3000 | PACK | Freq: Once | INTRAVENOUS | Status: AC | PRN
  Administered 2020-05-20: 30.3 via INTRAVENOUS
  Filled 2020-05-20: qty 31

## 2020-05-29 ENCOUNTER — Other Ambulatory Visit: Payer: Self-pay

## 2020-05-29 DIAGNOSIS — K5790 Diverticulosis of intestine, part unspecified, without perforation or abscess without bleeding: Secondary | ICD-10-CM | POA: Insufficient documentation

## 2020-05-29 DIAGNOSIS — F419 Anxiety disorder, unspecified: Secondary | ICD-10-CM | POA: Insufficient documentation

## 2020-05-29 DIAGNOSIS — Z862 Personal history of diseases of the blood and blood-forming organs and certain disorders involving the immune mechanism: Secondary | ICD-10-CM | POA: Insufficient documentation

## 2020-05-29 DIAGNOSIS — M810 Age-related osteoporosis without current pathological fracture: Secondary | ICD-10-CM | POA: Insufficient documentation

## 2020-05-29 DIAGNOSIS — C4491 Basal cell carcinoma of skin, unspecified: Secondary | ICD-10-CM | POA: Insufficient documentation

## 2020-05-29 DIAGNOSIS — M549 Dorsalgia, unspecified: Secondary | ICD-10-CM | POA: Insufficient documentation

## 2020-05-29 DIAGNOSIS — T8859XA Other complications of anesthesia, initial encounter: Secondary | ICD-10-CM | POA: Insufficient documentation

## 2020-05-29 DIAGNOSIS — Z8619 Personal history of other infectious and parasitic diseases: Secondary | ICD-10-CM | POA: Insufficient documentation

## 2020-05-29 DIAGNOSIS — C3490 Malignant neoplasm of unspecified part of unspecified bronchus or lung: Secondary | ICD-10-CM | POA: Insufficient documentation

## 2020-05-29 DIAGNOSIS — K625 Hemorrhage of anus and rectum: Secondary | ICD-10-CM | POA: Insufficient documentation

## 2020-05-29 DIAGNOSIS — Z9889 Other specified postprocedural states: Secondary | ICD-10-CM | POA: Insufficient documentation

## 2020-05-29 DIAGNOSIS — R112 Nausea with vomiting, unspecified: Secondary | ICD-10-CM | POA: Insufficient documentation

## 2020-05-29 DIAGNOSIS — I499 Cardiac arrhythmia, unspecified: Secondary | ICD-10-CM | POA: Insufficient documentation

## 2020-05-29 DIAGNOSIS — K635 Polyp of colon: Secondary | ICD-10-CM | POA: Insufficient documentation

## 2020-06-01 ENCOUNTER — Other Ambulatory Visit: Payer: Self-pay

## 2020-06-01 ENCOUNTER — Ambulatory Visit: Payer: Medicare HMO | Admitting: Cardiology

## 2020-06-01 ENCOUNTER — Encounter: Payer: Self-pay | Admitting: Cardiology

## 2020-06-01 VITALS — BP 118/79 | HR 100 | Ht 63.0 in | Wt 148.0 lb

## 2020-06-01 DIAGNOSIS — E782 Mixed hyperlipidemia: Secondary | ICD-10-CM

## 2020-06-01 DIAGNOSIS — I2510 Atherosclerotic heart disease of native coronary artery without angina pectoris: Secondary | ICD-10-CM | POA: Diagnosis not present

## 2020-06-01 NOTE — Patient Instructions (Signed)

## 2020-06-01 NOTE — Progress Notes (Signed)
Cardiology Office Note:    Date:  06/01/2020   ID:  Rose Figueroa, DOB 21-Feb-1956, MRN [id number]  PCP:  Lyman Bishop, DO  Cardiologist:  Jenean Lindau, MD   Referring MD: Lyman Bishop, DO    ASSESSMENT:    1. Atherosclerosis of native coronary artery of native heart without angina pectoris   2. Mixed dyslipidemia    PLAN:    In order of problems listed above:  1. Coronary artery calcifications: Secondary prevention stressed with the patient.  Importance of compliance with diet medication stressed and she vocalized understanding. 2. Mixed dyslipidemia: Lipids were reviewed his she is now taking atorvastatin 10 mg daily and dieting well.  She will be back in the next few weeks for blood work fasting for lipid follow-up.  Diet was emphasized. 3. I advised the patient to exercise at least half an hour a day 5 days a week and she promises to do so in the using a bicycle or walking on a daily basis. 4. Patient will be seen in follow-up appointment in 6 months or earlier if the patient has any concerns    Medication Adjustments/Labs and Tests Ordered: Current medicines are reviewed at length with the patient today.  Concerns regarding medicines are outlined above.  No orders of the defined types were placed in this encounter.  No orders of the defined types were placed in this encounter.    No chief complaint on file.    History of Present Illness:    Rose Figueroa is a 64 y.o. female.  Patient has past medical history of coronary artery disease noted directed by calcification seen on CT scan.  She has had lung cancer with the treatment.  She tells me that she has finished radiation.  She denies any chest pain orthopnea or PND.  She is going to start an active ambulation process she had put it on hold because of orthopedic issues involving her foot.  At the time of my evaluation, the patient is alert awake oriented and in no distress.  Past Medical History:    Diagnosis Date  . Anxiety   . Back pain    right back lateral  . BCC (basal cell carcinoma of skin)   . Bone metastasis ([place]) 10/04/2017  . Cancer of upper lobe of right lung ([place]) 05/19/2015  . Cardiac murmur 04/24/2020  . Chest discomfort 04/24/2020  . Colon polyps   . Complication of anesthesia    pt. states that she is difficult to arouse  . Coronary atherosclerosis 04/24/2020  . Diverticulosis   . Dysrhythmia    "skip beat in early 20's"  . Encounter for antineoplastic chemotherapy 06/08/2015  . Generalized rash 06/24/2015  . H/O cold sores   . History of anemia   . Lung cancer ([place])    RUL mass -being tx- radiation and chemo- remains last chemo 01-04-16  . 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 Metastatic non-small cell lung cancer ([place]) 03/24/2020  . Mixed dyslipidemia 04/24/2020  . Oral lesion 06/24/2015  . Osteoporosis   . PONV (postoperative nausea and vomiting)   . Port catheter in place 11/20/2015  . Rectal bleeding   . right upper lobe lung mass 04/15/2015   malignant tumor found - 10'16-radiation and chemotherapy and remains with chemo at present- Dr. Earlie Server follows.    Past Surgical History:  Procedure Laterality Date  . BREAST EXCISIONAL BIOPSY Right   . BREAST EXCISIONAL BIOPSY Right   .  BREAST EXCISIONAL BIOPSY Right   . COLONOSCOPY    . COLONOSCOPY W/ POLYPECTOMY    . COLONOSCOPY WITH PROPOFOL N/A 01/20/2016   Procedure: COLONOSCOPY WITH PROPOFOL;  Surgeon: Arta Silence, MD;  [facility];  Service: Endoscopy;  Laterality: N/A;  . CYSTECTOMY     right breast x 2  . IR REMOVAL TUN ACCESS W/ PORT W/O FL MOD SED  12/12/2018  . MEDIASTINOSCOPY N/A 05/07/2015   Procedure: MEDIASTINOSCOPY;  Surgeon: Ivin Poot, MD;  Location: [place];  Service: Thoracic;  Laterality: N/A;  . TONSILLECTOMY    . VIDEO BRONCHOSCOPY WITH ENDOBRONCHIAL ULTRASOUND N/A 05/07/2015   Procedure: VIDEO BRONCHOSCOPY WITH ENDOBRONCHIAL ULTRASOUND;   Surgeon: Ivin Poot, MD;  [facility];  Service: Thoracic;  Laterality: N/A;    Current Medications: Current Meds  Medication Sig  . Acetylcysteine (N-ACETYL-L-CYSTEINE) 600 MG CAPS Take 1 capsule by mouth daily.  Marland Kitchen atorvastatin (LIPITOR) 10 MG tablet Take 1 tablet (10 mg total) by mouth daily.  . Biotin 5000 MCG CAPS Take 10,000 mcg by mouth daily.   . Calcium Carb-Cholecalciferol (CALCIUM 1000 + D PO) Take 3 tablets by mouth 2 (two) times daily. D3 = " 25 mcg"  . L-LYSINE PO Take 1,000 mg by mouth daily.   . Magnesium 400 MG CAPS Take 1 capsule by mouth daily.  . Melatonin 3 MG CAPS Take 1 capsule by mouth at bedtime.  . Multiple Vitamins-Minerals (PRESERVISION/LUTEIN PO) Take 1 capsule by mouth daily.  . nitroGLYCERIN (NITROSTAT) 0.4 MG SL tablet Place 1 tablet (0.4 mg total) under the tongue every 5 (five) minutes as needed.  . Omega-3 Fatty Acids (RA FISH OIL) 1000 MG CPDR Take 1 capsule by mouth daily.   Marland Kitchen PREBIOTIC PRODUCT PO Take by mouth.  . Probiotic Product (PROBIOTIC DAILY PO) Take 1 capsule by mouth daily.  . Psyllium (REGULOID PO) Take 750 mg by mouth daily.  . Turmeric Curcumin 500 MG CAPS Take 1 each by mouth daily.   . vitamin B-12 (CYANOCOBALAMIN) 1000 MCG tablet Take 1,000 mcg by mouth daily.     Allergies:   Patient has no known allergies.   Social History   Socioeconomic History  . Marital status: Married    Spouse name: Not on file  . Number of children: Not on file  . Years of education: Not on file  . Highest education level: Not on file  Occupational History  . Occupation: retired  Tobacco Use  . Smoking status: Former Smoker    Packs/day: 1.00    Years: 5.00    Pack years: 5.00    Types: Cigarettes    Quit date: 07/26/1975    Years since quitting: 44.8  . Smokeless tobacco: Never Used  Substance and Sexual Activity  . Alcohol use: Yes    Alcohol/week: 0.0 standard drinks    Comment: occ-. none now.  . Drug use: No  . Sexual activity:  Not on file  Other Topics Concern  . Not on file  Social History Narrative  . Not on file   Social Determinants of Health   Financial Resource Strain:   . Difficulty of Paying Living Expenses: Not on file  Food Insecurity:   . Worried About Charity fundraiser in the Last Year: Not on file  . Ran Out of Food in the Last Year: Not on file  Transportation Needs:   . Lack of Transportation (Medical): Not on file  . Lack of Transportation (Non-Medical): Not  on file  Physical Activity:   . Days of Exercise per Week: Not on file  . Minutes of Exercise per Session: Not on file  Stress:   . Feeling of Stress : Not on file  Social Connections:   . Frequency of Communication with Friends and Family: Not on file  . Frequency of Social Gatherings with Friends and Family: Not on file  . Attends Religious Services: Not on file  . Active Member of Clubs or Organizations: Not on file  . Attends Archivist Meetings: Not on file  . Marital Status: Not on file     Family History: The patient's family history includes Cancer in her father and maternal grandmother; Hypertension in her mother.  ROS:   Please see the history of present illness.    All other systems reviewed and are negative.  EKGs/Labs/Other Studies Reviewed:    The following studies were reviewed today: IMPRESSIONS    1. Left ventricular ejection fraction, by estimation, is 60 to 65%. The  left ventricle has normal function. The left ventricle has no regional  wall motion abnormalities. Left ventricular diastolic parameters are  indeterminate. The average left  ventricular global longitudinal strain is -12.2 %.  2. Right ventricular systolic function is normal. The right ventricular  size is normal. There is normal pulmonary artery systolic pressure.  3. The mitral valve is normal in structure. Trivial mitral valve  regurgitation. No evidence of mitral stenosis.  4. Tricuspid valve regurgitation is mild to  moderate.  5. The aortic valve is normal in structure. Aortic valve regurgitation is  not visualized. No aortic stenosis is present.   Study Highlights   The left ventricular ejection fraction is hyperdynamic (>65%).  Nuclear stress EF: 77%.  There was no ST segment deviation noted during stress.  The study is normal.  This is a low risk study.    Recent Labs: 03/20/2020: BUN 20; Creatinine 0.71; Hemoglobin 13.7; Platelet Count 286; Potassium 4.3; Sodium 137 04/27/2020: ALT 17  Recent Lipid Panel    Component Value Date/Time   CHOL 221 (H) 04/27/2020 0842   TRIG 60 04/27/2020 0842   HDL 75 04/27/2020 0842   CHOLHDL 2.9 04/27/2020 0842   LDLCALC 136 (H) 04/27/2020 0842    Physical Exam:    VS:  BP 118/79   Pulse 100   Ht 5\' 3"  (1.6 m)   Wt 148 lb (67.1 kg)   SpO2 98%   BMI 26.22 kg/m     Wt Readings from Last 3 Encounters:  06/01/20 148 lb (67.1 kg)  05/19/20 148 lb (67.1 kg)  04/24/20 148 lb (67.1 kg)     GEN: Patient is in no acute distress HEENT: Normal NECK: No JVD; No carotid bruits LYMPHATICS: No lymphadenopathy CARDIAC: Hear sounds regular, 2/6 systolic murmur at the apex. RESPIRATORY:  Clear to auscultation without rales, wheezing or rhonchi  ABDOMEN: Soft, non-tender, non-distended MUSCULOSKELETAL:  No edema; No deformity  SKIN: Warm and dry NEUROLOGIC:  Alert and oriented x 3 PSYCHIATRIC:  Normal affect   Signed, Jenean Lindau, MD  06/01/2020 2:45 PM    [facility]

## 2020-06-08 DIAGNOSIS — I2510 Atherosclerotic heart disease of native coronary artery without angina pectoris: Secondary | ICD-10-CM | POA: Diagnosis not present

## 2020-06-08 DIAGNOSIS — E782 Mixed hyperlipidemia: Secondary | ICD-10-CM | POA: Diagnosis not present

## 2020-06-09 LAB — LIPID PANEL
Chol/HDL Ratio: 2.5 ratio (ref 0.0–4.4)
Cholesterol, Total: 181 mg/dL (ref 100–199)
HDL: 73 mg/dL (ref >39)
LDL Chol Calc (NIH): 96 mg/dL (ref 0–99)
Triglycerides: 64 mg/dL (ref 0–149)
VLDL Cholesterol Cal: 12 mg/dL (ref 5–40)

## 2020-06-09 LAB — HEPATIC FUNCTION PANEL
ALT: 21 IU/L (ref 0–32)
AST: 30 IU/L (ref 0–40)
Albumin: 4.4 g/dL (ref 3.8–4.8)
Alkaline Phosphatase: 46 IU/L (ref 44–121)
Bilirubin Total: 0.3 mg/dL (ref 0.0–1.2)
Bilirubin, Direct: 0.12 mg/dL (ref 0.00–0.40)
Total Protein: 6.6 g/dL (ref 6.0–8.5)

## 2020-06-10 ENCOUNTER — Ambulatory Visit: Payer: Self-pay | Admitting: Urology

## 2020-06-15 DIAGNOSIS — M810 Age-related osteoporosis without current pathological fracture: Secondary | ICD-10-CM | POA: Diagnosis not present

## 2020-06-15 DIAGNOSIS — Z23 Encounter for immunization: Secondary | ICD-10-CM | POA: Diagnosis not present

## 2020-06-16 ENCOUNTER — Ambulatory Visit: Payer: Medicare HMO | Admitting: Podiatry

## 2020-06-16 NOTE — Progress Notes (Signed)
  Radiation Oncology         [phone] ________________________________  Name: Rose Figueroa MRN: [id number]  Date: 05/04/2020  DOB: August 16, 1955  End of Treatment Note  Diagnosis:   64 y.o. woman with recurrent non-small cell lung cancer in the LUL lung and left supraclavicular lymph node.     Indication for treatment:  Curative, Definitive SBRT       Radiation treatment dates:   04/21/20 - 05/04/20  Site/dose:    1. The LUL lung target was treated to 54 Gy in 3 fractions of 18 Gy 2. The left supraclavicular lymph node was treated to 50 Gy in 10 fractions of 5 Gy each.  Beams/energy:    1. The patient was treated using stereotactic body radiotherapy according to a 3D conformal radiotherapy plan.  Volumetric arc fields were employed to deliver 6 MV X-rays.  Image guidance was performed with per fraction cone beam CT prior to treatment under personal MD supervision.  Immobilization was achieved using BodyFix Pillow. 2. 3D/photons/ 6X  Narrative: The patient tolerated radiation treatment relatively well.   She did experience some mild dysphagia and decreased appetite but denied any shortness of breath, cough or hemoptysis.  Plan: The patient has completed radiation treatment. The patient will return to radiation oncology clinic for routine followup in one month. I advised them to call or return sooner if they have any questions or concerns related to their recovery or treatment. ________________________________  Sheral Apley. Tammi Klippel, M.D.

## 2020-06-16 NOTE — Progress Notes (Signed)
Radiation Oncology         [phone] ________________________________  Name: Rose Figueroa MRN: [id number]  Date: 06/17/2020  DOB: 1956-04-04  Post Treatment Note  CC: Masneri, Adele Barthel, DO  Masneri, Adele Barthel, DO  Diagnosis:   64 y.o. woman with recurrent non-small cell lung cancer in the LUL lung and left supraclavicular lymph node.     Interval Since Last Radiation:  6 weeks  04/21/20 - 05/04/20: 1.         The LUL lung target was treated to 54 Gy in 3 fractions of 18 Gy 2.         The left supraclavicular lymph node was treated to 50 Gy in 10 fractions of 5 Gy each.  10/19/2017 - 11/01/2017: 1. Left Chest / 30 Gy in 10 fractions 2. Thoracic Spine / 30 Gy in 10 fractions  06/01/2015-07/16/2015:The primaryRULtumor and involved mediastinal adenopathy were treated to 66 Gy in 33 fractions of 2 Gy.  Narrative:  I spoke with the patient to conduct her routine scheduled 1 month follow up visit via telephone to spare the patient unnecessary potential exposure in the healthcare setting during the current COVID-19 pandemic.  The patient was notified in advance and gave permission to proceed with this visit format.  She tolerated radiation treatment relatively well.   She did experience some mild dysphagia and decreased appetite but denied any shortness of breath, cough or hemoptysis.                              On review of systems, the patient states that she is doing very well in general and is currently without complaints.  She did develop some mild erythema and skin changes in the left supraclavicular region but this resolved with some cream that was prescribed by her dermatologist.  She also reports that her energy is gradually improving and she no longer has dysphagia or decreased appetite.  She denies any shortness of breath, productive cough, hemoptysis or chest pain.  Overall, she is quite pleased with her progress to date.  ALLERGIES:  has No Known  Allergies.  Meds: Current Outpatient Medications  Medication Sig Dispense Refill  . Acetylcysteine (N-ACETYL-L-CYSTEINE) 600 MG CAPS Take 1 capsule by mouth daily.    Marland Kitchen atorvastatin (LIPITOR) 10 MG tablet Take 1 tablet (10 mg total) by mouth daily. 30 tablet 3  . Biotin 5000 MCG CAPS Take 10,000 mcg by mouth daily.     . Calcium Carb-Cholecalciferol (CALCIUM 1000 + D PO) Take 3 tablets by mouth 2 (two) times daily. D3 = " 25 mcg"    . L-LYSINE PO Take 1,000 mg by mouth daily.     . Magnesium 400 MG CAPS Take 1 capsule by mouth daily.    . Melatonin 3 MG CAPS Take 1 capsule by mouth at bedtime.    . Multiple Vitamins-Minerals (PRESERVISION/LUTEIN PO) Take 1 capsule by mouth daily.    . nitroGLYCERIN (NITROSTAT) 0.4 MG SL tablet Place 1 tablet (0.4 mg total) under the tongue every 5 (five) minutes as needed. 25 tablet 6  . Omega-3 Fatty Acids (RA FISH OIL) 1000 MG CPDR Take 1 capsule by mouth daily.     Marland Kitchen PREBIOTIC PRODUCT PO Take by mouth.    . Probiotic Product (PROBIOTIC DAILY PO) Take 1 capsule by mouth daily.    . Psyllium (REGULOID PO) Take 750 mg by mouth daily.    Marland Kitchen  Turmeric Curcumin 500 MG CAPS Take 1 each by mouth daily.     . vitamin B-12 (CYANOCOBALAMIN) 1000 MCG tablet Take 1,000 mcg by mouth daily.     No current facility-administered medications for this visit.    Physical Findings:  vitals were not taken for this visit.   /Unable to assess due to telephone follow-up visit format.  Lab Findings: Lab Results  Component Value Date   WBC 4.3 03/20/2020   HGB 13.7 03/20/2020   HCT 41.4 03/20/2020   MCV 91.8 03/20/2020   PLT 286 03/20/2020     Radiographic Findings: MYOCARDIAL PERFUSION IMAGING  Result Date: 05/20/2020  The left ventricular ejection fraction is hyperdynamic (>65%).  Nuclear stress EF: 77%.  There was no ST segment deviation noted during stress.  The study is normal.  This is a low risk study.    ECHOCARDIOGRAM COMPLETE  Result Date:  05/19/2020    ECHOCARDIOGRAM REPORT   Patient Name:   Rose Figueroa Date of Exam: 05/19/2020 Medical Rec #:  [id number]        Height:       63.0 in Accession #:    [id number]       Weight:       148.0 lb Date of Birth:  [birth year]        BSA:          1.701 m Patient Age:    26 years         BP:           143/100 mmHg Patient Gender: F                HR:           78 bpm. Exam Location:  [address] Procedure: 2D Echo, Cardiac Doppler and Color Doppler Indications:    R01.1 Murmur  History:        Patient has no prior history of Echocardiogram examinations.                 Lung cancer, Signs/Symptoms:Chest discomfort; Risk                 Factors:Dyslipidemia.  Sonographer:    Marygrace Drought RCS Referring Phys: Lesslie  1. Left ventricular ejection fraction, by estimation, is 60 to 65%. The left ventricle has normal function. The left ventricle has no regional wall motion abnormalities. Left ventricular diastolic parameters are indeterminate. The average left ventricular global longitudinal strain is -12.2 %.  2. Right ventricular systolic function is normal. The right ventricular size is normal. There is normal pulmonary artery systolic pressure.  3. The mitral valve is normal in structure. Trivial mitral valve regurgitation. No evidence of mitral stenosis.  4. Tricuspid valve regurgitation is mild to moderate.  5. The aortic valve is normal in structure. Aortic valve regurgitation is not visualized. No aortic stenosis is present. FINDINGS  Left Ventricle: Left ventricular ejection fraction, by estimation, is 60 to 65%. The left ventricle has normal function. The left ventricle has no regional wall motion abnormalities. The average left ventricular global longitudinal strain is -12.2 %. The left ventricular internal cavity size was normal in size. There is no left ventricular hypertrophy. Left ventricular diastolic parameters are indeterminate. Right Ventricle: The right ventricular  size is normal. No increase in right ventricular wall thickness. Right ventricular systolic function is normal. There is normal pulmonary artery systolic pressure. The tricuspid regurgitant velocity is 2.51 m/s, and  with an  assumed right atrial pressure of 3 mmHg, the estimated right ventricular systolic pressure is 32.9 mmHg. Left Atrium: Left atrial size was normal in size. Right Atrium: Right atrial size was normal in size. Pericardium: There is no evidence of pericardial effusion. Mitral Valve: The mitral valve is normal in structure. Trivial mitral valve regurgitation. No evidence of mitral valve stenosis. Tricuspid Valve: The tricuspid valve is normal in structure. Tricuspid valve regurgitation is mild to moderate. No evidence of tricuspid stenosis. Aortic Valve: The aortic valve is normal in structure. Aortic valve regurgitation is not visualized. No aortic stenosis is present. Pulmonic Valve: The pulmonic valve was normal in structure. Pulmonic valve regurgitation is not visualized. Aorta: The aortic root and ascending aorta are structurally normal, with no evidence of dilitation. IAS/Shunts: The atrial septum is grossly normal.  LEFT VENTRICLE PLAX 2D LVIDd:         3.60 cm  Diastology LVIDs:         2.30 cm  LV e' medial:    5.11 cm/s LV PW:         0.90 cm  LV E/e' medial:  13.5 LV IVS:        1.10 cm  LV e' lateral:   7.07 cm/s LVOT diam:     1.90 cm  LV E/e' lateral: 9.8 LV SV:         65 LV SV Index:   38       2D Longitudinal Strain LVOT Area:     2.84 cm 2D Strain GLS Avg:     -12.2 %  RIGHT VENTRICLE RV Basal diam:  2.66 cm RV S prime:     9.90 cm/s TAPSE (M-mode): 1.5 cm RVSP:           28.2 mmHg LEFT ATRIUM             Index       RIGHT ATRIUM           Index LA diam:        2.40 cm 1.41 cm/m  RA Pressure: 3.00 mmHg LA Vol (A2C):   32.5 ml 19.10 ml/m RA Area:     9.84 cm LA Vol (A4C):   30.9 ml 18.16 ml/m RA Volume:   19.10 ml  11.23 ml/m LA Biplane Vol: 33.2 ml 19.51 ml/m  AORTIC VALVE  LVOT Vmax:   105.00 cm/s LVOT Vmean:  70.000 cm/s LVOT VTI:    0.229 m  AORTA Ao Root diam: 3.20 cm Ao Asc diam:  3.40 cm MITRAL VALVE               TRICUSPID VALVE MV Area (PHT):             TR Peak grad:   25.2 mmHg MV Decel Time:             TR Vmax:        251.00 cm/s MV E velocity: 69.00 cm/s  Estimated RAP:  3.00 mmHg MV A velocity: 75.40 cm/s  RVSP:           28.2 mmHg MV E/A ratio:  0.92                            SHUNTS                            Systemic VTI:  0.23 m  Systemic Diam: 1.90 cm Mertie Moores MD Electronically signed by Mertie Moores MD Signature Date/Time: 05/19/2020/10:47:19 AM    Final     Impression/Plan: 40. 64 y.o. woman with recurrent non-small cell lung cancer in the LUL lung and left supraclavicular lymph node.   She appears to have recovered well from the effects of her recent radiotherapy and is currently without complaints.  We discussed that while we are happy to continue to participate in her care if clinically indicated, at this point we will plan to see her back on an as-needed basis.  She will continue in routine follow-up under the care and direction of Dr. Earlie Server for continued management of her systemic disease.  She is comfortable with the stated plan and knows that she is welcome to call at anytime with any questions or concerns related to her previous radiation.    Nicholos Johns, PA-C

## 2020-06-17 ENCOUNTER — Encounter: Payer: Self-pay | Admitting: Urology

## 2020-06-17 ENCOUNTER — Other Ambulatory Visit: Payer: Self-pay

## 2020-06-17 ENCOUNTER — Ambulatory Visit
Admission: RE | Admit: 2020-06-17 | Discharge: 2020-06-17 | Disposition: A | Discharge status: Home / Self Care | Payer: Medicare HMO | Source: Ambulatory Visit | Attending: Urology | Admitting: Urology

## 2020-06-17 DIAGNOSIS — C770 Secondary and unspecified malignant neoplasm of lymph nodes of head, face and neck: Secondary | ICD-10-CM

## 2020-06-17 DIAGNOSIS — C3411 Malignant neoplasm of upper lobe, right bronchus or lung: Secondary | ICD-10-CM

## 2020-06-22 ENCOUNTER — Other Ambulatory Visit: Payer: Self-pay | Admitting: Internal Medicine

## 2020-06-22 DIAGNOSIS — C3490 Malignant neoplasm of unspecified part of unspecified bronchus or lung: Secondary | ICD-10-CM

## 2020-06-23 ENCOUNTER — Telehealth: Payer: Self-pay | Admitting: Internal Medicine

## 2020-06-23 NOTE — Telephone Encounter (Signed)
Scheduled appt per 11/29 sch msg - mailed reminder letter with appt date and time

## 2020-07-14 DIAGNOSIS — M810 Age-related osteoporosis without current pathological fracture: Secondary | ICD-10-CM | POA: Diagnosis not present

## 2020-07-15 ENCOUNTER — Other Ambulatory Visit: Payer: Self-pay | Admitting: Nurse Practitioner

## 2020-07-15 DIAGNOSIS — M810 Age-related osteoporosis without current pathological fracture: Secondary | ICD-10-CM

## 2020-07-22 ENCOUNTER — Other Ambulatory Visit: Payer: Medicare HMO

## 2020-07-22 ENCOUNTER — Other Ambulatory Visit: Payer: Self-pay

## 2020-07-22 ENCOUNTER — Ambulatory Visit (INDEPENDENT_AMBULATORY_CARE_PROVIDER_SITE_OTHER): Payer: Medicare HMO

## 2020-07-22 DIAGNOSIS — M8589 Other specified disorders of bone density and structure, multiple sites: Secondary | ICD-10-CM | POA: Diagnosis not present

## 2020-07-22 DIAGNOSIS — M810 Age-related osteoporosis without current pathological fracture: Secondary | ICD-10-CM | POA: Diagnosis not present

## 2020-07-22 DIAGNOSIS — Z87828 Personal history of other (healed) physical injury and trauma: Secondary | ICD-10-CM | POA: Diagnosis not present

## 2020-07-22 DIAGNOSIS — Z8262 Family history of osteoporosis: Secondary | ICD-10-CM | POA: Diagnosis not present

## 2020-07-22 DIAGNOSIS — Z780 Asymptomatic menopausal state: Secondary | ICD-10-CM | POA: Diagnosis not present

## 2020-07-23 DIAGNOSIS — H5213 Myopia, bilateral: Secondary | ICD-10-CM | POA: Diagnosis not present

## 2020-07-23 DIAGNOSIS — H40013 Open angle with borderline findings, low risk, bilateral: Secondary | ICD-10-CM | POA: Diagnosis not present

## 2020-07-23 DIAGNOSIS — H2513 Age-related nuclear cataract, bilateral: Secondary | ICD-10-CM | POA: Diagnosis not present

## 2020-07-23 DIAGNOSIS — D3121 Benign neoplasm of right retina: Secondary | ICD-10-CM | POA: Diagnosis not present

## 2020-07-26 ENCOUNTER — Other Ambulatory Visit: Payer: Self-pay | Admitting: Cardiology

## 2020-09-21 ENCOUNTER — Ambulatory Visit (HOSPITAL_COMMUNITY)
Admission: RE | Admit: 2020-09-21 | Discharge: 2020-09-21 | Disposition: A | Discharge status: Home / Self Care | Payer: Medicare HMO | Source: Ambulatory Visit | Attending: Internal Medicine | Admitting: Internal Medicine

## 2020-09-21 ENCOUNTER — Other Ambulatory Visit: Payer: Self-pay

## 2020-09-21 ENCOUNTER — Inpatient Hospital Stay: Payer: Medicare HMO | Attending: Internal Medicine

## 2020-09-21 DIAGNOSIS — C3411 Malignant neoplasm of upper lobe, right bronchus or lung: Secondary | ICD-10-CM | POA: Insufficient documentation

## 2020-09-21 DIAGNOSIS — C3490 Malignant neoplasm of unspecified part of unspecified bronchus or lung: Secondary | ICD-10-CM | POA: Insufficient documentation

## 2020-09-21 LAB — CBC WITH DIFFERENTIAL (CANCER CENTER ONLY)
Abs Immature Granulocytes: 0.01 10*3/uL (ref 0.00–0.07)
Basophils Absolute: 0 10*3/uL (ref 0.0–0.1)
Basophils Relative: 1 %
Eosinophils Absolute: 0.1 10*3/uL (ref 0.0–0.5)
Eosinophils Relative: 3 %
HCT: 39.3 % (ref 36.0–46.0)
Hemoglobin: 12.8 g/dL (ref 12.0–15.0)
Immature Granulocytes: 0 %
Lymphocytes Relative: 30 %
Lymphs Abs: 1.1 10*3/uL (ref 0.7–4.0)
MCH: 30.6 pg (ref 26.0–34.0)
MCHC: 32.6 g/dL (ref 30.0–36.0)
MCV: 94 fL (ref 80.0–100.0)
Monocytes Absolute: 0.5 10*3/uL (ref 0.1–1.0)
Monocytes Relative: 13 %
Neutro Abs: 2 10*3/uL (ref 1.7–7.7)
Neutrophils Relative %: 53 %
Platelet Count: 245 10*3/uL (ref 150–400)
RBC: 4.18 MIL/uL (ref 3.87–5.11)
RDW: 13.7 % (ref 11.5–15.5)
WBC Count: 3.7 10*3/uL — ABNORMAL LOW (ref 4.0–10.5)
nRBC: 0 % (ref 0.0–0.2)

## 2020-09-21 LAB — CMP (CANCER CENTER ONLY)
ALT: 22 U/L (ref 0–44)
AST: 28 U/L (ref 15–41)
Albumin: 4 g/dL (ref 3.5–5.0)
Alkaline Phosphatase: 44 U/L (ref 38–126)
Anion gap: 7 (ref 5–15)
BUN: 18 mg/dL (ref 8–23)
CO2: 26 mmol/L (ref 22–32)
Calcium: 9.3 mg/dL (ref 8.9–10.3)
Chloride: 103 mmol/L (ref 98–111)
Creatinine: 0.78 mg/dL (ref 0.44–1.00)
GFR, Estimated: >60 mL/min (ref >60)
Glucose, Bld: 91 mg/dL (ref 70–99)
Potassium: 4 mmol/L (ref 3.5–5.1)
Sodium: 136 mmol/L (ref 135–145)
Total Bilirubin: 0.3 mg/dL (ref 0.3–1.2)
Total Protein: 6.9 g/dL (ref 6.5–8.1)

## 2020-09-21 MED ORDER — IOHEXOL 300 MG/ML  SOLN
75.0000 mL | Freq: Once | INTRAMUSCULAR | Status: AC | PRN
  Administered 2020-09-21: 75 mL via INTRAVENOUS

## 2020-09-23 ENCOUNTER — Other Ambulatory Visit: Payer: Self-pay

## 2020-09-23 ENCOUNTER — Inpatient Hospital Stay: Payer: Medicare HMO | Attending: Internal Medicine | Admitting: Internal Medicine

## 2020-09-23 ENCOUNTER — Encounter: Payer: Self-pay | Admitting: Internal Medicine

## 2020-09-23 VITALS — BP 130/88 | HR 101 | Temp 99.0°F | Resp 18 | Ht 63.0 in | Wt 152.0 lb

## 2020-09-23 DIAGNOSIS — C770 Secondary and unspecified malignant neoplasm of lymph nodes of head, face and neck: Secondary | ICD-10-CM | POA: Diagnosis not present

## 2020-09-23 DIAGNOSIS — C3412 Malignant neoplasm of upper lobe, left bronchus or lung: Secondary | ICD-10-CM | POA: Diagnosis not present

## 2020-09-23 DIAGNOSIS — C3490 Malignant neoplasm of unspecified part of unspecified bronchus or lung: Secondary | ICD-10-CM

## 2020-09-23 DIAGNOSIS — C3411 Malignant neoplasm of upper lobe, right bronchus or lung: Secondary | ICD-10-CM | POA: Diagnosis not present

## 2020-09-23 DIAGNOSIS — Z923 Personal history of irradiation: Secondary | ICD-10-CM | POA: Insufficient documentation

## 2020-09-23 DIAGNOSIS — C7951 Secondary malignant neoplasm of bone: Secondary | ICD-10-CM | POA: Diagnosis not present

## 2020-09-23 MED ORDER — DOXYCYCLINE HYCLATE 100 MG PO TABS: 100.0000 mg | ORAL_TABLET | Freq: Two times a day (BID) | ORAL | 0 refills | Status: DC

## 2020-09-23 NOTE — Progress Notes (Signed)
.      [facility] [phone]   Fax:([phone]  OFFICE PROGRESS NOTE  Rose Bishop, DO [address] [place] [address]  DIAGNOSIS: Stage IIIB (T2a, N3, M0) non-small cell lung cancer, adenocarcinoma with negative EGFR mutation and negative ALK gene translocation diagnosed in October 2016.  Biomarker Findings Microsatellite status - MS-Stable Tumor Mutational Burden - TMB-Low (5 Muts/Mb) Genomic Findings For a complete list of the genes assayed, please refer to the Appendix. ROS1 SDC4-ROS1 fusion RBM10 N524f*14 TP53 R273C 7 Disease relevant genes with no reportable alterations: EGFR, KRAS, ALK, BRAF, MET, RET, ERBB2  PRIOR THERAPY:  1) Concurrent chemoradiation with weekly carboplatin for AUC of 2 and paclitaxel 45 MG/M2 status post 6 cycles. Last dose was given 07/13/2015 with partial response. 2) Systemic chemotherapy with carboplatin for AUC of 5 and Alimta 500 MG/M2 every 3 weeks. First dose 11/02/2015. Status post 6 cycles. 3) palliative stereotactic radiotherapy to the left supraclavicular lymph node under the care of Dr. MTammi Klippel  Loss of fraction of radiotherapy scheduled for November 01, 2017 4) palliative radiotherapy to the left supraclavicular lymph node as well as left upper lobe lung mass completed May 04, 2020 under the care of Dr. MTammi Klippel  CURRENT THERAPY: Observation.  INTERVAL HISTORY: Rose JUDY89y.o. female returns to the clinic today for follow-up visit.  The patient is feeling fine today with no concerning complaints except for mild fatigue and cough productive of whitish sputum.  She denied having any chest pain, shortness of breath or hemoptysis.  She denied having any weight loss or night sweats.  She has no nausea, vomiting, diarrhea or constipation.  She has no headache or visual changes.  She tolerated her previous radiotherapy fairly well.  The patient had repeat CT scan of the chest performed  recently and she is here for evaluation and discussion of her risk her results.  MEDICAL HISTORY: Past Medical History:  Diagnosis Date  . Anxiety   . Back pain    right back lateral  . BCC (basal cell carcinoma of skin)   . Bone metastasis (Rose Figueroa 10/04/2017  . Cancer of upper lobe of right lung (Rose Figueroa 05/19/2015  . Cardiac murmur 04/24/2020  . Chest discomfort 04/24/2020  . Colon polyps   . Complication of anesthesia    pt. states that she is difficult to arouse  . Coronary atherosclerosis 04/24/2020  . Diverticulosis   . Dysrhythmia    "skip beat in early 20's"  . Encounter for antineoplastic chemotherapy 06/08/2015  . Generalized rash 06/24/2015  . H/O cold sores   . History of anemia   . [place]    RUL mass -being tx- radiation and chemo- remains last chemo 01-04-16  . Metastasis to supraclavicular lymph node (Rose Figueroa 10/04/2017  . Metastatic non-small cell lung cancer (Rose Figueroa 03/24/2020  . Mixed dyslipidemia 04/24/2020  . Oral lesion 06/24/2015  . Osteoporosis   . PONV (postoperative nausea and vomiting)   . Port catheter in place 11/20/2015  . Rectal bleeding   . right upper lobe lung mass 04/15/2015   malignant tumor found - 10'16-radiation and chemotherapy and remains with chemo at present- Dr. MEarlie Serverfollows.    ALLERGIES:  has No Known Allergies.  MEDICATIONS:  Current Outpatient Medications  Medication Sig Dispense Refill  . Acetylcysteine (N-ACETYL-L-CYSTEINE) 600 MG CAPS Take 1 capsule by mouth daily.    .Marland Kitchenatorvastatin (LIPITOR) 10 MG tablet TAKE 1 TABLET BY MOUTH  EVERY DAY 90 tablet 2  . Biotin 5000 MCG CAPS Take 10,000 mcg by mouth daily.     . Calcium Carb-Cholecalciferol (CALCIUM 1000 + D PO) Take 3 tablets by mouth 2 (two) times daily. D3 = " 25 mcg"    . L-LYSINE PO Take 1,000 mg by mouth daily.     . Magnesium 400 MG CAPS Take 1 capsule by mouth daily.    . Melatonin 3 MG CAPS Take 1 capsule by mouth at bedtime.    . Multiple Vitamins-Minerals  (PRESERVISION/LUTEIN PO) Take 1 capsule by mouth daily.    . nitroGLYCERIN (NITROSTAT) 0.4 MG SL tablet Place 1 tablet (0.4 mg total) under the tongue every 5 (five) minutes as needed. 25 tablet 6  . Omega-3 Fatty Acids (RA FISH OIL) 1000 MG CPDR Take 1 capsule by mouth daily.     Marland Kitchen PREBIOTIC PRODUCT PO Take by mouth.    . Probiotic Product (PROBIOTIC DAILY PO) Take 1 capsule by mouth daily.    . Psyllium (REGULOID PO) Take 750 mg by mouth daily.    . Turmeric Curcumin 500 MG CAPS Take 1 each by mouth daily.     . vitamin B-12 (CYANOCOBALAMIN) 1000 MCG tablet Take 1,000 mcg by mouth daily.     No current facility-administered medications for this visit.    SURGICAL HISTORY:  Past Surgical History:  Procedure Laterality Date  . BREAST EXCISIONAL BIOPSY Right   . BREAST EXCISIONAL BIOPSY Right   . BREAST EXCISIONAL BIOPSY Right   . COLONOSCOPY    . COLONOSCOPY W/ POLYPECTOMY    . COLONOSCOPY WITH PROPOFOL N/A 01/20/2016   Procedure: COLONOSCOPY WITH PROPOFOL;  Surgeon: Arta Silence, MD;  Location: [facility];  Service: Endoscopy;  Laterality: N/A;  . CYSTECTOMY     right breast x 2  . IR REMOVAL TUN ACCESS W/ PORT W/O FL MOD SED  12/12/2018  . MEDIASTINOSCOPY N/A 05/07/2015   Procedure: MEDIASTINOSCOPY;  Surgeon: Ivin Poot, MD;  Location: [facility];  Service: Thoracic;  Laterality: N/A;  . TONSILLECTOMY    . VIDEO BRONCHOSCOPY WITH ENDOBRONCHIAL ULTRASOUND N/A 05/07/2015   Procedure: VIDEO BRONCHOSCOPY WITH ENDOBRONCHIAL ULTRASOUND;  Surgeon: Ivin Poot, MD;  Location: [facility];  Service: Thoracic;  Laterality: N/A;    REVIEW OF SYSTEMS:  A comprehensive review of systems was negative except for: Constitutional: positive for fatigue Respiratory: positive for cough   PHYSICAL EXAMINATION: General appearance: alert, cooperative, fatigued and no distress Head: Normocephalic, without obvious abnormality, atraumatic Neck: no adenopathy, no JVD, supple, symmetrical, trachea midline  and thyroid not enlarged, symmetric, no tenderness/mass/nodules Lymph nodes: Cervical, supraclavicular, and axillary nodes normal. Resp: clear to auscultation bilaterally Back: symmetric, no curvature. ROM normal. No CVA tenderness. Cardio: regular rate and rhythm, S1, S2 normal, no murmur, click, rub or gallop GI: soft, non-tender; bowel sounds normal; no masses,  no organomegaly Extremities: extremities normal, atraumatic, no cyanosis or edema  ECOG PERFORMANCE STATUS: 1 - Symptomatic but completely ambulatory  Blood pressure 130/88, pulse (!) 101, temperature 99 F (37.2 C), resp. rate 18, height _0  (1.6 m), weight 152 lb (68.9 kg), SpO2 99 %.  LABORATORY DATA: Lab Results  Component Value Date   WBC 3.7 (L) 09/21/2020   HGB 12.8 09/21/2020   HCT 39.3 09/21/2020   MCV 94.0 09/21/2020   PLT 245 09/21/2020      Chemistry      Component Value Date/Time   NA 136 09/21/2020 0934   NA 139  03/20/2017 1038   K 4.0 09/21/2020 0934   K 4.3 03/20/2017 1038   CL 103 09/21/2020 0934   CO2 26 09/21/2020 0934   CO2 27 03/20/2017 1038   BUN 18 09/21/2020 0934   BUN 15.4 03/20/2017 1038   CREATININE 0.78 09/21/2020 0934   CREATININE 0.7 03/20/2017 1038      Component Value Date/Time   CALCIUM 9.3 09/21/2020 0934   CALCIUM 9.8 03/20/2017 1038   ALKPHOS 44 09/21/2020 0934   ALKPHOS 37 (L) 03/20/2017 1038   AST 28 09/21/2020 0934   AST 25 03/20/2017 1038   ALT 22 09/21/2020 0934   ALT 22 03/20/2017 1038   BILITOT 0.3 09/21/2020 0934   BILITOT 0.42 03/20/2017 1038       RADIOGRAPHIC STUDIES: CT Chest W Contrast  Result Date: 09/21/2020 CLINICAL DATA:  Lung cancer, radiation therapy completed 4 months ago. EXAM: CT CHEST WITH CONTRAST TECHNIQUE: Multidetector CT imaging of the chest was performed during intravenous contrast administration. CONTRAST:  27m OMNIPAQUE IOHEXOL 300 MG/ML  SOLN COMPARISON:  PET 04/01/2020 and CT chest 03/20/2020. FINDINGS: Cardiovascular: There is  occlusion of the left brachiocephalic vein with collateral flow, as before. Heart size normal. Small amount of pericardial fluid appears similar. Mediastinum/Nodes: Subcentimeter low-attenuation lesion in the left thyroid. No follow-up recommended (ref: J Am Coll Radiol. 2015 Feb;12(2): 143-50).No pathologically enlarged mediastinal, hilar or axillary lymph nodes. Esophagus is grossly unremarkable. Lungs/Pleura: Post treatment pulmonary retraction and bronchiectasis in the medial right hemithorax. Nodular consolidation in the posterolateral inferior right lower lobe (7/122) is new. Trace right pleural fluid is seen adjacent. New consolidation and parenchymal retraction in the apical left upper lobe obscures a previously seen left upper lobe nodule. No left pleural fluid. Airway is otherwise unremarkable. Upper Abdomen: Visualized portions of the liver, adrenal glands, kidneys, spleen, pancreas, stomach and bowel are unremarkable. Musculoskeletal: New patchy sclerosis in the C7 vertebral body. T2 sclerosis is similar. There is sclerosis in the superior endplate of T6, where there is a mild compression deformity, new and likely pathologic. Sclerosis in the left scapular acromion, as before. Suspect pathologic fractures of the right posterolateral third and fourth ribs, similar. Additional healed fractures are seen in the posterior mid right ribs. IMPRESSION: 1. New sclerotic lesions in C7 and the superior endplate of T6, with a pathologic compression fracture. 2. New post treatment changes in the left upper lobe obscure a previously seen left upper lobe nodule. 3. New nodular consolidation in the posteroinferior right lower lobe, possibly infectious or inflammatory in etiology. Difficult to exclude disease recurrence. Continued attention on follow-up is recommended. 4. Tiny right pleural effusion. Electronically Signed   By: MLorin PicketM.D.   On: 09/21/2020 14:03   ASSESSMENT AND PLAN:  This is a very pleasant  65years old white female with a stage IIIB non-small cell lung cancer, adenocarcinoma with negative EGFR, ALK mutations diagnosed in October 2016.  Molecular studies showed positive ROS 1 She is status post course of concurrent chemoradiation followed by consolidation chemotherapy with carboplatin and Alimta. The patient has been in observation for close to 3 years. She underwent palliative radiotherapy to the left supraclavicular lymph nodes with improvement of her disease. She also underwent SBRT to left upper lobe lung nodule as well as left supraclavicular lymphadenopathy under the care of Dr. MTammi Klippelcompleted in October 2021. The patient had repeat CT scan of the chest performed recently.  I personally and independently reviewed the scans and discussed the results with the  patient today. Her scan showed compression fracture of T6-7 in addition to opacity in the right lower lobe suspicious for inflammatory process but recurrent disease could not be excluded. I recommended for her to continue on observation with repeat CT scan of the chest in 3 months for further evaluation of this area in the right lung.  In the meantime I will start her on empirically on doxycycline 100 mg p.o. twice daily for 7 days. If she has any concerning finding on the upcoming scan, I may consider her for treatment with targeted therapy with Entrectinib. The patient was advised to call immediately if she has any concerning symptoms in the interval.  The patient voices understanding of current disease status and treatment options and is in agreement with the current care plan. All questions were answered. The patient knows to call the clinic with any problems, questions or concerns. We can certainly see the patient much sooner if necessary.  Disclaimer: This note was dictated with voice recognition software. Similar sounding words can inadvertently be transcribed and may not be corrected upon review.

## 2020-09-24 ENCOUNTER — Telehealth: Payer: Self-pay | Admitting: Internal Medicine

## 2020-09-24 NOTE — Telephone Encounter (Signed)
Scheduled per los. Called and left msg. Mailed printout  °

## 2020-10-13 ENCOUNTER — Other Ambulatory Visit: Payer: Medicare HMO

## 2020-11-03 DIAGNOSIS — J019 Acute sinusitis, unspecified: Secondary | ICD-10-CM | POA: Diagnosis not present

## 2020-11-30 DIAGNOSIS — L570 Actinic keratosis: Secondary | ICD-10-CM | POA: Diagnosis not present

## 2020-11-30 DIAGNOSIS — Z85828 Personal history of other malignant neoplasm of skin: Secondary | ICD-10-CM | POA: Diagnosis not present

## 2020-11-30 DIAGNOSIS — L821 Other seborrheic keratosis: Secondary | ICD-10-CM | POA: Diagnosis not present

## 2020-12-18 ENCOUNTER — Other Ambulatory Visit: Payer: Self-pay

## 2020-12-18 DIAGNOSIS — G8929 Other chronic pain: Secondary | ICD-10-CM

## 2020-12-18 DIAGNOSIS — Z860100 Personal history of colon polyps, unspecified: Secondary | ICD-10-CM | POA: Insufficient documentation

## 2020-12-18 DIAGNOSIS — R918 Other nonspecific abnormal finding of lung field: Secondary | ICD-10-CM

## 2020-12-18 DIAGNOSIS — I7090 Unspecified atherosclerosis: Secondary | ICD-10-CM

## 2020-12-18 DIAGNOSIS — C3490 Malignant neoplasm of unspecified part of unspecified bronchus or lung: Secondary | ICD-10-CM | POA: Insufficient documentation

## 2020-12-18 DIAGNOSIS — M810 Age-related osteoporosis without current pathological fracture: Secondary | ICD-10-CM | POA: Insufficient documentation

## 2020-12-18 DIAGNOSIS — M5430 Sciatica, unspecified side: Secondary | ICD-10-CM

## 2020-12-18 DIAGNOSIS — E559 Vitamin D deficiency, unspecified: Secondary | ICD-10-CM

## 2020-12-18 HISTORY — DX: Personal history of colonic polyps: Z86.010

## 2020-12-18 HISTORY — DX: Personal history of colon polyps, unspecified: Z86.0100

## 2020-12-18 HISTORY — DX: Sciatica, unspecified side: M54.30

## 2020-12-18 HISTORY — DX: Other nonspecific abnormal finding of lung field: R91.8

## 2020-12-18 HISTORY — DX: Age-related osteoporosis without current pathological fracture: M81.0

## 2020-12-18 HISTORY — DX: Vitamin D deficiency, unspecified: E55.9

## 2020-12-18 HISTORY — DX: Other chronic pain: G89.29

## 2020-12-18 HISTORY — DX: Unspecified atherosclerosis: I70.90

## 2020-12-22 ENCOUNTER — Encounter: Payer: Self-pay | Admitting: Cardiology

## 2020-12-22 ENCOUNTER — Other Ambulatory Visit: Payer: Self-pay

## 2020-12-22 ENCOUNTER — Ambulatory Visit: Payer: Medicare HMO | Admitting: Cardiology

## 2020-12-22 VITALS — BP 136/74 | HR 92 | Ht 63.0 in | Wt 154.0 lb

## 2020-12-22 DIAGNOSIS — C3411 Malignant neoplasm of upper lobe, right bronchus or lung: Secondary | ICD-10-CM | POA: Diagnosis not present

## 2020-12-22 DIAGNOSIS — E782 Mixed hyperlipidemia: Secondary | ICD-10-CM | POA: Diagnosis not present

## 2020-12-22 DIAGNOSIS — I2510 Atherosclerotic heart disease of native coronary artery without angina pectoris: Secondary | ICD-10-CM | POA: Diagnosis not present

## 2020-12-22 NOTE — Progress Notes (Signed)
Cardiology Office Note:    Date:  12/22/2020   ID:  Rose Figueroa, DOB 10/22/1955, MRN [id number]  PCP:  Aura Dials, MD  Cardiologist:  Jenean Lindau, MD   Referring MD: Lyman Bishop, DO    ASSESSMENT:    1. Atherosclerosis of native coronary artery of native heart without angina pectoris   2. Cancer of upper lobe of right lung ([place])   3. Mixed dyslipidemia    PLAN:    In order of problems listed above:  1. Coronary artery disease: Secondary prevention stressed with the patient.  Importance of compliance with diet medication stressed and she vocalized understanding.  She is doing her best to be active. 2. Mixed dyslipidemia: Diet was emphasized.  She is on statin therapy and wishes to continue it.  Lipids were reviewed.  I respect her wishes.  She is going for blood work in the next week with oncologist and I have added liver lipid test to be added to her panel. 3. Lung cancer: Managed by oncologist. 4. Patient will be seen in follow-up appointment in 6 months or earlier if the patient has any concerns    Medication Adjustments/Labs and Tests Ordered: Current medicines are reviewed at length with the patient today.  Concerns regarding medicines are outlined above.  No orders of the defined types were placed in this encounter.  No orders of the defined types were placed in this encounter.    No chief complaint on file.    History of Present Illness:    Rose Figueroa is a 65 y.o. female.  Patient has past medical history of coronary artery disease and mixed dyslipidemia.  She has lung cancer.  She denies any problems at this time and takes care of activities of daily living.  No chest pain orthopnea or PND.  She tells me that for some reason she feels better with the statin and she does not understand "why.  No chest pain orthopnea or PND.  She has some dyspnea on exertion this is steady and has not worsened.  She sees a lung doctor and her  oncologist.  Past Medical History:  Diagnosis Date  . Age related osteoporosis 12/18/2020  . Anxiety   . Atherosclerosis 12/18/2020  . Back pain    right back lateral  . BCC (basal cell carcinoma of skin)   . Bone metastasis ([place]) 10/04/2017  . Cancer of upper lobe of right lung ([place]) 05/19/2015  . Cardiac murmur 04/24/2020  . Chest discomfort 04/24/2020  . Chronic pain 12/18/2020  . Colon polyps   . Complication of anesthesia    pt. states that she is difficult to arouse  . Coronary atherosclerosis 04/24/2020  . Diverticulosis   . Dysrhythmia    "skip beat in early 20's"  . Encounter for antineoplastic chemotherapy 06/08/2015  . Generalized rash 06/24/2015  . H/O cold sores   . History of anemia   . Lung cancer ([place])    RUL mass -being tx- radiation and chemo- remains last chemo 01-04-16  . Lung field abnormal 12/18/2020  . 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 Metastatic non-small cell lung cancer ([place]) 03/24/2020  . Mixed dyslipidemia 04/24/2020  . Oral lesion 06/24/2015  . Osteoporosis   . Personal history of colonic polyps 12/18/2020  . PONV (postoperative nausea and vomiting)   . Port catheter in place 11/20/2015  . Rectal bleeding   . right upper lobe lung mass 04/15/2015   malignant tumor found -  10'16-radiation and chemotherapy and remains with chemo at present- Dr. Earlie Server follows.  . Sciatica 12/18/2020  . Vitamin D deficiency 12/18/2020    Past Surgical History:  Procedure Laterality Date  . BREAST EXCISIONAL BIOPSY Right   . BREAST EXCISIONAL BIOPSY Right   . BREAST EXCISIONAL BIOPSY Right   . COLONOSCOPY    . COLONOSCOPY W/ POLYPECTOMY    . COLONOSCOPY WITH PROPOFOL N/A 01/20/2016   Procedure: COLONOSCOPY WITH PROPOFOL;  Surgeon: Arta Silence, MD;  Location: [facility];  Service: Endoscopy;  Laterality: N/A;  . CYSTECTOMY     right breast x 2  . IR REMOVAL TUN ACCESS W/ PORT W/O FL MOD SED  12/12/2018  . MEDIASTINOSCOPY N/A 05/07/2015    Procedure: MEDIASTINOSCOPY;  Surgeon: Ivin Poot, MD;  Location: [place];  Service: Thoracic;  Laterality: N/A;  . TONSILLECTOMY    . VIDEO BRONCHOSCOPY WITH ENDOBRONCHIAL ULTRASOUND N/A 05/07/2015   Procedure: VIDEO BRONCHOSCOPY WITH ENDOBRONCHIAL ULTRASOUND;  Surgeon: Ivin Poot, MD;  Location: [facility];  Service: Thoracic;  Laterality: N/A;    Current Medications: Current Meds  Medication Sig  . Biotin 10000 MCG TABS Take 10,000 mcg by mouth daily in the afternoon.  . cetirizine (ZYRTEC) 10 MG tablet Take 10 mg by mouth daily.  . nitroGLYCERIN (NITROSTAT) 0.4 MG SL tablet Place 0.4 mg under the tongue every 5 (five) minutes as needed for chest pain.     Allergies:   Patient has no known allergies.   Social History   Socioeconomic History  . Marital status: Married    Spouse name: Not on file  . Number of children: Not on file  . Years of education: Not on file  . Highest education level: Not on file  Occupational History  . Occupation: retired  Tobacco Use  . Smoking status: Former Smoker    Packs/day: 1.00    Years: 5.00    Pack years: 5.00    Types: Cigarettes    Quit date: 07/26/1975    Years since quitting: 45.4  . Smokeless tobacco: Never Used  Substance and Sexual Activity  . Alcohol use: Yes    Alcohol/week: 0.0 standard drinks    Comment: occ-. none now.  . Drug use: No  . Sexual activity: Not on file  Other Topics Concern  . Not on file  Social History Narrative  . Not on file   Social Determinants of Health   Financial Resource Strain: Not on file  Food Insecurity: Not on file  Transportation Needs: Not on file  Physical Activity: Not on file  Stress: Not on file  Social Connections: Not on file     Family History: The patient's family history includes Cancer in her father and maternal grandmother; Hypertension in her mother.  ROS:   Please see the history of present illness.    All other systems reviewed and are  negative.  EKGs/Labs/Other Studies Reviewed:    The following studies were reviewed today: I discussed my findings with the patient at length.   Recent Labs: 09/21/2020: ALT 22; BUN 18; Creatinine 0.78; Hemoglobin 12.8; Platelet Count 245; Potassium 4.0; Sodium 136  Recent Lipid Panel    Component Value Date/Time   CHOL 181 06/08/2020 1016   TRIG 64 06/08/2020 1016   HDL 73 06/08/2020 1016   CHOLHDL 2.5 06/08/2020 1016   LDLCALC 96 06/08/2020 1016    Physical Exam:    VS:  BP 136/74   Pulse 92   Ht 5\' 3"  (  1.6 m)   Wt 154 lb (69.9 kg)   SpO2 97%   BMI 27.28 kg/m     Wt Readings from Last 3 Encounters:  12/22/20 154 lb (69.9 kg)  09/23/20 152 lb (68.9 kg)  06/01/20 148 lb (67.1 kg)     GEN: Patient is in no acute distress HEENT: Normal NECK: No JVD; No carotid bruits LYMPHATICS: No lymphadenopathy CARDIAC: Hear sounds regular, 2/6 systolic murmur at the apex. RESPIRATORY:  Clear to auscultation without rales, wheezing or rhonchi  ABDOMEN: Soft, non-tender, non-distended MUSCULOSKELETAL:  No edema; No deformity  SKIN: Warm and dry NEUROLOGIC:  Alert and oriented x 3 PSYCHIATRIC:  Normal affect   Signed, Jenean Lindau, MD  12/22/2020 11:41 AM    [place]

## 2020-12-22 NOTE — Patient Instructions (Signed)
Medication Instructions:  No medication changes. *If you need a refill on your cardiac medications before your next appointment, please call your pharmacy*   Lab Work: Your physician recommends that you return for lab work in: at the time of your oncology labs. You need to have labs done when you are fasting.  You can come Monday through Friday 8:30 am to 12:00 pm and 1:15 to 4:30. You do not need to make an appointment as the order has already been placed. The labs you are going to have done are BMET, CBC, TSH, LFT and Lipids.  If you have labs (blood work) drawn today and your tests are completely normal, you will receive your results only by: Marland Kitchen MyChart Message (if you have MyChart) OR . A paper copy in the mail If you have any lab test that is abnormal or we need to change your treatment, we will call you to review the results.   Testing/Procedures: None ordered   Follow-Up: At [facility], you and your health needs are our priority.  As part of our continuing mission to provide you with exceptional heart care, we have created designated Provider Care Teams.  These Care Teams include your primary Cardiologist (physician) and Advanced Practice Providers (APPs -  Physician Assistants and Nurse Practitioners) who all work together to provide you with the care you need, when you need it.  We recommend signing up for the patient portal called "MyChart".  Sign up information is provided on this After Visit Summary.  MyChart is used to connect with patients for Virtual Visits (Telemedicine).  Patients are able to view lab/test results, encounter notes, upcoming appointments, etc.  Non-urgent messages can be sent to your provider as well.   To learn more about what you can do with MyChart, go to [email or web address].    Your next appointment:   6 month(s)  The format for your next appointment:   In Person  Provider:   Jyl Heinz, MD   Other Instructions NA

## 2020-12-28 ENCOUNTER — Other Ambulatory Visit: Payer: Self-pay

## 2020-12-28 ENCOUNTER — Other Ambulatory Visit: Payer: Medicare HMO

## 2020-12-28 ENCOUNTER — Ambulatory Visit (HOSPITAL_COMMUNITY)
Admission: RE | Admit: 2020-12-28 | Discharge: 2020-12-28 | Disposition: A | Discharge status: Home / Self Care | Payer: Medicare HMO | Source: Ambulatory Visit | Attending: Internal Medicine | Admitting: Internal Medicine

## 2020-12-28 ENCOUNTER — Telehealth: Payer: Self-pay | Admitting: Cardiology

## 2020-12-28 ENCOUNTER — Inpatient Hospital Stay: Payer: Medicare HMO | Attending: Internal Medicine

## 2020-12-28 DIAGNOSIS — I2510 Atherosclerotic heart disease of native coronary artery without angina pectoris: Secondary | ICD-10-CM | POA: Insufficient documentation

## 2020-12-28 DIAGNOSIS — E782 Mixed hyperlipidemia: Secondary | ICD-10-CM | POA: Diagnosis not present

## 2020-12-28 DIAGNOSIS — C3490 Malignant neoplasm of unspecified part of unspecified bronchus or lung: Secondary | ICD-10-CM

## 2020-12-28 DIAGNOSIS — C770 Secondary and unspecified malignant neoplasm of lymph nodes of head, face and neck: Secondary | ICD-10-CM | POA: Diagnosis not present

## 2020-12-28 DIAGNOSIS — R5383 Other fatigue: Secondary | ICD-10-CM | POA: Insufficient documentation

## 2020-12-28 DIAGNOSIS — Z8719 Personal history of other diseases of the digestive system: Secondary | ICD-10-CM | POA: Diagnosis not present

## 2020-12-28 DIAGNOSIS — Z9221 Personal history of antineoplastic chemotherapy: Secondary | ICD-10-CM | POA: Diagnosis not present

## 2020-12-28 DIAGNOSIS — R059 Cough, unspecified: Secondary | ICD-10-CM | POA: Insufficient documentation

## 2020-12-28 DIAGNOSIS — Z85118 Personal history of other malignant neoplasm of bronchus and lung: Secondary | ICD-10-CM | POA: Diagnosis not present

## 2020-12-28 DIAGNOSIS — R0609 Other forms of dyspnea: Secondary | ICD-10-CM | POA: Diagnosis not present

## 2020-12-28 DIAGNOSIS — C3412 Malignant neoplasm of upper lobe, left bronchus or lung: Secondary | ICD-10-CM | POA: Insufficient documentation

## 2020-12-28 DIAGNOSIS — S22050A Wedge compression fracture of T5-T6 vertebra, initial encounter for closed fracture: Secondary | ICD-10-CM | POA: Diagnosis not present

## 2020-12-28 DIAGNOSIS — J90 Pleural effusion, not elsewhere classified: Secondary | ICD-10-CM | POA: Diagnosis not present

## 2020-12-28 DIAGNOSIS — Z923 Personal history of irradiation: Secondary | ICD-10-CM | POA: Diagnosis not present

## 2020-12-28 DIAGNOSIS — E559 Vitamin D deficiency, unspecified: Secondary | ICD-10-CM | POA: Insufficient documentation

## 2020-12-28 DIAGNOSIS — I700 Atherosclerosis of aorta: Secondary | ICD-10-CM | POA: Diagnosis not present

## 2020-12-28 DIAGNOSIS — Z79899 Other long term (current) drug therapy: Secondary | ICD-10-CM | POA: Diagnosis not present

## 2020-12-28 DIAGNOSIS — Z85828 Personal history of other malignant neoplasm of skin: Secondary | ICD-10-CM | POA: Insufficient documentation

## 2020-12-28 DIAGNOSIS — S2241XA Multiple fractures of ribs, right side, initial encounter for closed fracture: Secondary | ICD-10-CM | POA: Diagnosis not present

## 2020-12-28 LAB — CMP (CANCER CENTER ONLY)
ALT: 23 U/L (ref 0–44)
AST: 29 U/L (ref 15–41)
Albumin: 4 g/dL (ref 3.5–5.0)
Alkaline Phosphatase: 45 U/L (ref 38–126)
Anion gap: 10 (ref 5–15)
BUN: 15 mg/dL (ref 8–23)
CO2: 24 mmol/L (ref 22–32)
Calcium: 9.8 mg/dL (ref 8.9–10.3)
Chloride: 103 mmol/L (ref 98–111)
Creatinine: 0.73 mg/dL (ref 0.44–1.00)
GFR, Estimated: >60 mL/min (ref >60)
Glucose, Bld: 90 mg/dL (ref 70–99)
Potassium: 4 mmol/L (ref 3.5–5.1)
Sodium: 137 mmol/L (ref 135–145)
Total Bilirubin: 0.6 mg/dL (ref 0.3–1.2)
Total Protein: 6.8 g/dL (ref 6.5–8.1)

## 2020-12-28 LAB — CBC WITH DIFFERENTIAL (CANCER CENTER ONLY)
Abs Immature Granulocytes: 0.01 10*3/uL (ref 0.00–0.07)
Basophils Absolute: 0 10*3/uL (ref 0.0–0.1)
Basophils Relative: 1 %
Eosinophils Absolute: 0.1 10*3/uL (ref 0.0–0.5)
Eosinophils Relative: 3 %
HCT: 38.6 % (ref 36.0–46.0)
Hemoglobin: 13 g/dL (ref 12.0–15.0)
Immature Granulocytes: 0 %
Lymphocytes Relative: 26 %
Lymphs Abs: 1.2 10*3/uL (ref 0.7–4.0)
MCH: 30.7 pg (ref 26.0–34.0)
MCHC: 33.7 g/dL (ref 30.0–36.0)
MCV: 91.3 fL (ref 80.0–100.0)
Monocytes Absolute: 0.5 10*3/uL (ref 0.1–1.0)
Monocytes Relative: 11 %
Neutro Abs: 2.6 10*3/uL (ref 1.7–7.7)
Neutrophils Relative %: 59 %
Platelet Count: 257 10*3/uL (ref 150–400)
RBC: 4.23 MIL/uL (ref 3.87–5.11)
RDW: 14 % (ref 11.5–15.5)
WBC Count: 4.4 10*3/uL (ref 4.0–10.5)
nRBC: 0 % (ref 0.0–0.2)

## 2020-12-28 MED ORDER — IOHEXOL 300 MG/ML  SOLN
75.0000 mL | Freq: Once | INTRAMUSCULAR | Status: AC | PRN
  Administered 2020-12-28: 75 mL via INTRAVENOUS

## 2020-12-28 MED ORDER — SODIUM CHLORIDE (PF) 0.9 % IJ SOLN
INTRAMUSCULAR | Status: AC
  Filled 2020-12-28: qty 50

## 2020-12-28 NOTE — Telephone Encounter (Signed)
Maudie Mercury is calling stating the pt advised them Dr. Geraldo Pitter is wanting a Lipid panel and the patient is wanting it drawn with their office. She is requesting the order be faxed to [phone]. Please advise.

## 2020-12-28 NOTE — Telephone Encounter (Signed)
Request faxed as requested.

## 2020-12-29 LAB — LIPID PANEL
Chol/HDL Ratio: 2.3 ratio (ref 0.0–4.4)
Cholesterol, Total: 166 mg/dL (ref 100–199)
HDL: 71 mg/dL (ref >39)
LDL Chol Calc (NIH): 84 mg/dL (ref 0–99)
Triglycerides: 55 mg/dL (ref 0–149)
VLDL Cholesterol Cal: 11 mg/dL (ref 5–40)

## 2020-12-30 ENCOUNTER — Other Ambulatory Visit: Payer: Self-pay

## 2020-12-30 ENCOUNTER — Encounter: Payer: Self-pay | Admitting: *Deleted

## 2020-12-30 ENCOUNTER — Inpatient Hospital Stay: Payer: Medicare HMO | Admitting: Internal Medicine

## 2020-12-30 VITALS — BP 126/81 | HR 97 | Temp 97.3°F | Resp 20 | Ht 63.0 in | Wt 153.0 lb

## 2020-12-30 DIAGNOSIS — R059 Cough, unspecified: Secondary | ICD-10-CM | POA: Diagnosis not present

## 2020-12-30 DIAGNOSIS — C3490 Malignant neoplasm of unspecified part of unspecified bronchus or lung: Secondary | ICD-10-CM

## 2020-12-30 DIAGNOSIS — I700 Atherosclerosis of aorta: Secondary | ICD-10-CM | POA: Diagnosis not present

## 2020-12-30 DIAGNOSIS — J90 Pleural effusion, not elsewhere classified: Secondary | ICD-10-CM | POA: Diagnosis not present

## 2020-12-30 DIAGNOSIS — E782 Mixed hyperlipidemia: Secondary | ICD-10-CM | POA: Diagnosis not present

## 2020-12-30 DIAGNOSIS — C3412 Malignant neoplasm of upper lobe, left bronchus or lung: Secondary | ICD-10-CM | POA: Diagnosis not present

## 2020-12-30 DIAGNOSIS — C7951 Secondary malignant neoplasm of bone: Secondary | ICD-10-CM | POA: Diagnosis not present

## 2020-12-30 DIAGNOSIS — I2510 Atherosclerotic heart disease of native coronary artery without angina pectoris: Secondary | ICD-10-CM | POA: Diagnosis not present

## 2020-12-30 DIAGNOSIS — R5383 Other fatigue: Secondary | ICD-10-CM | POA: Diagnosis not present

## 2020-12-30 DIAGNOSIS — E559 Vitamin D deficiency, unspecified: Secondary | ICD-10-CM | POA: Diagnosis not present

## 2020-12-30 DIAGNOSIS — R0609 Other forms of dyspnea: Secondary | ICD-10-CM | POA: Diagnosis not present

## 2020-12-30 DIAGNOSIS — C770 Secondary and unspecified malignant neoplasm of lymph nodes of head, face and neck: Secondary | ICD-10-CM | POA: Diagnosis not present

## 2020-12-30 NOTE — Progress Notes (Signed)
.      [place] [phone]   [phone]  OFFICE PROGRESS NOTE  Aura Dials, MD [place] [address]  DIAGNOSIS: Stage IIIB (T2a, N3, M0) non-small cell lung cancer, adenocarcinoma with negative EGFR mutation and negative ALK gene translocation diagnosed in October 2016.  Biomarker Findings Microsatellite status - MS-Stable Tumor Mutational Burden - TMB-Low (5 Muts/Mb) Genomic Findings For a complete list of the genes assayed, please refer to the Appendix. ROS1 SDC4-ROS1 fusion RBM10 N523f*14 TP53 R273C 7 Disease relevant genes with no reportable alterations: EGFR, KRAS, ALK, BRAF, MET, RET, ERBB2  PRIOR THERAPY:  1) Concurrent chemoradiation with weekly carboplatin for AUC of 2 and paclitaxel 45 MG/M2 status post 6 cycles. Last dose was given 07/13/2015 with partial response. 2) Systemic chemotherapy with carboplatin for AUC of 5 and Alimta 500 MG/M2 every 3 weeks. First dose 11/02/2015. Status post 6 cycles. 3) palliative stereotactic radiotherapy to the left supraclavicular lymph node under the care of Dr. MTammi Klippel  Loss of fraction of radiotherapy scheduled for November 01, 2017 4) palliative radiotherapy to the left supraclavicular lymph node as well as left upper lobe lung mass completed May 04, 2020 under the care of Dr. MTammi Klippel  CURRENT THERAPY: Observation.  INTERVAL HISTORY: Rose POLIQUIN65y.o. female returns to the clinic today for follow-up visit.  The patient is feeling fine today with no concerning complaints except for worsening shortness of breath with exertion lately.  She she had flulike symptoms few weeks ago and was treated with 2 courses of antibiotics by her primary care physician with no complete resolution of her symptoms.  She denied having any chest pain but has mild cough with no hemoptysis.  She denied having any fever or chills.  She has no nausea, vomiting, diarrhea or constipation.  She  has no headache or visual changes.  She had repeat CT scan of the chest performed recently and she is here for evaluation and discussion of her risk her results.  MEDICAL HISTORY: Past Medical History:  Diagnosis Date  . Age related osteoporosis 12/18/2020  . Anxiety   . Atherosclerosis 12/18/2020  . Back pain    right back lateral  . BCC (basal cell carcinoma of skin)   . [place] 10/04[place]2017  . Cancer of [place] lobe of right lung ([place] 05/19/2015  . Cardiac murmur 04/24/2020  . Chest discomfort 04/24/2020  . Chronic pain 12/18/2020  . Colon polyps   . Complication of anesthesia    pt. states that she is difficult to arouse  . Coronary atherosclerosis 04/24/2020  . Diverticulosis   . Dysrhythmia    "skip beat in early 20's"  . Encounter for antineoplastic chemotherapy 06/08/2015  . Generalized rash 06/24/2015  . H/O cold sores   . History of anemia   . Lung cancer ([place]    RUL mass -being tx- radiation and chemo- remains last chemo 01-04-16  . Lung field abnormal 12/18/2020  . Metastasis to supraclavicular lymph node ([place] 10/04/2017  . Metastatic non-small cell lung cancer ([place] 03/24/2020  . Mixed dyslipidemia 04/24/2020  . Oral lesion 06/24/2015  . Osteoporosis   . Personal history of colonic polyps 12/18/2020  . PONV (postoperative nausea and vomiting)   . Port catheter in place 11/20/2015  . Rectal bleeding   . right upper lobe lung mass 04/15/2015   malignant tumor found - 10'16-radiation and chemotherapy and remains with chemo at present- Dr. MEarlie Serverfollows.  .Marland Kitchen  Sciatica 12/18/2020  . Vitamin D deficiency 12/18/2020    ALLERGIES:  has No Known Allergies.  MEDICATIONS:  Current Outpatient Medications  Medication Sig Dispense Refill  . albuterol (VENTOLIN HFA) 108 (90 Base) MCG/ACT inhaler Inhale 2 puffs into the lungs as needed for wheezing or shortness of breath.    Marland Kitchen atorvastatin (LIPITOR) 10 MG tablet Take 10 mg by mouth daily.    . Biotin 10000 MCG TABS Take  10,000 mcg by mouth daily in the afternoon.    . Calcium Carb-Cholecalciferol (CALCIUM 1000 + D PO) Take 3 tablets by mouth 2 (two) times daily. D3 = " 25 mcg"    . cetirizine (ZYRTEC) 10 MG tablet Take 10 mg by mouth daily.    Marland Kitchen L-LYSINE PO Take 1,000 mg by mouth daily.     . Magnesium 400 MG CAPS Take 1 capsule by mouth daily.    . Melatonin 3 MG CAPS Take 1 capsule by mouth at bedtime.    . Multiple Vitamins-Minerals (PRESERVISION/LUTEIN PO) Take 1 capsule by mouth daily.    . nitroGLYCERIN (NITROSTAT) 0.4 MG SL tablet Place 0.4 mg under the tongue every 5 (five) minutes as needed for chest pain.    . Omega-3 Fatty Acids (RA FISH OIL) 1000 MG CPDR Take 1 capsule by mouth daily.     Marland Kitchen PREBIOTIC PRODUCT PO Take 1 tablet by mouth daily.    . Probiotic Product (PROBIOTIC DAILY PO) Take 1 capsule by mouth daily.    . Psyllium (REGULOID PO) Take 750 mg by mouth daily.    . Turmeric Curcumin 500 MG CAPS Take 1 each by mouth daily.     . vitamin B-12 (CYANOCOBALAMIN) 1000 MCG tablet Take 1,000 mcg by mouth daily.     No current facility-administered medications for this visit.    SURGICAL HISTORY:  Past Surgical History:  Procedure Laterality Date  . BREAST EXCISIONAL BIOPSY Right   . BREAST EXCISIONAL BIOPSY Right   . BREAST EXCISIONAL BIOPSY Right   . COLONOSCOPY    . COLONOSCOPY W/ POLYPECTOMY    . COLONOSCOPY WITH PROPOFOL N/A 01/20/2016   Procedure: COLONOSCOPY WITH PROPOFOL;  Surgeon: Arta Silence, MD;  [facility]: [facility];  Service: Endoscopy;  Laterality: N/A;  . CYSTECTOMY     right breast x 2  . IR REMOVAL TUN ACCESS W/ PORT W/O FL MOD SED  12/12/2018  . MEDIASTINOSCOPY N/A 05/07/2015   Procedure: MEDIASTINOSCOPY;  Surgeon: Ivin Poot, MD;  Location: [place];  Service: Thoracic;  Laterality: N/A;  . TONSILLECTOMY    . VIDEO BRONCHOSCOPY WITH ENDOBRONCHIAL ULTRASOUND N/A 05/07/2015   Procedure: VIDEO BRONCHOSCOPY WITH ENDOBRONCHIAL ULTRASOUND;  Surgeon: Ivin Poot,  MD;  Location: [facility];  Service: Thoracic;  Laterality: N/A;    REVIEW OF SYSTEMS:  A comprehensive review of systems was negative except for: Constitutional: positive for fatigue Respiratory: positive for cough and dyspnea on exertion   PHYSICAL EXAMINATION: General appearance: alert, cooperative, fatigued and no distress Head: Normocephalic, without obvious abnormality, atraumatic Neck: no adenopathy, no JVD, supple, symmetrical, trachea midline and thyroid not enlarged, symmetric, no tenderness/mass/nodules Lymph nodes: Cervical, supraclavicular, and axillary nodes normal. Resp: clear to auscultation bilaterally Back: symmetric, no curvature. ROM normal. No CVA tenderness. Cardio: regular rate and rhythm, S1, S2 normal, no murmur, click, rub or gallop GI: soft, non-tender; bowel sounds normal; no masses,  no organomegaly Extremities: extremities normal, atraumatic, no cyanosis or edema  ECOG PERFORMANCE STATUS: 1 - Symptomatic but completely ambulatory  Blood pressure 126/81, pulse 97, temperature (!) 97.3 F (36.3 C), temperature source Tympanic, resp. rate 20, height 5' 3"  (1.6 m), weight 153 lb (69.4 kg), SpO2 100 %.  LABORATORY DATA: Lab Results  Component Value Date   WBC 4.4 12/28/2020   HGB 13.0 12/28/2020   HCT 38.6 12/28/2020   MCV 91.3 12/28/2020   PLT 257 12/28/2020      Chemistry      Component Value Date/Time   NA 137 12/28/2020 1401   NA 139 03/20/2017 1038   K 4.0 12/28/2020 1401   K 4.3 03/20/2017 1038   CL 103 12/28/2020 1401   CO2 24 12/28/2020 1401   CO2 27 03/20/2017 1038   BUN 15 12/28/2020 1401   BUN 15.4 03/20/2017 1038   CREATININE 0.73 12/28/2020 1401   CREATININE 0.7 03/20/2017 1038      Component Value Date/Time   CALCIUM 9.8 12/28/2020 1401   CALCIUM 9.8 03/20/2017 1038   ALKPHOS 45 12/28/2020 1401   ALKPHOS 37 (L) 03/20/2017 1038   AST 29 12/28/2020 1401   AST 25 03/20/2017 1038   ALT 23 12/28/2020 1401   ALT 22 03/20/2017 1038    BILITOT 0.6 12/28/2020 1401   BILITOT 0.42 03/20/2017 1038       RADIOGRAPHIC STUDIES: CT Chest W Contrast  Result Date: 12/29/2020 CLINICAL DATA:  65 year old female with history of non-small cell lung cancer status post chemotherapy and radiation therapy (now complete). Follow-up study. EXAM: CT CHEST WITH CONTRAST TECHNIQUE: Multidetector CT imaging of the chest was performed during intravenous contrast administration. CONTRAST:  29m OMNIPAQUE IOHEXOL 300 MG/ML  SOLN COMPARISON:  Chest CT 09/21/2020. FINDINGS: Cardiovascular: Heart size is normal. There is no significant pericardial fluid, thickening or pericardial calcification. Aortic atherosclerosis. No definite coronary artery calcifications. Mediastinum/Nodes: No pathologically enlarged mediastinal or hilar lymph nodes. Esophagus is unremarkable in appearance. No axillary lymphadenopathy. Lungs/Pleura: Chronic areas of nodular and masslike architectural distortion with surrounding ground-glass attenuation and septal thickening are again noted in the left upper lobe near the apex and in the paramediastinal aspect of the right lung (predominantly in the right lower lobe), compatible with areas of postradiation fibrosis. 5 mm pulmonary nodule in the medial aspect of the right lower lobe (axial image 112 of series 2), new compared to the prior examination, but nonspecific. Persistent nodular area of architectural distortion in the posterior aspect of the right lower lobe (axial image 121 of series 5) measuring 1.6 x 0.8 cm on today's examination, slightly more prominent than the prior study. Small right pleural effusion lying dependently. No acute consolidative airspace disease. Upper Abdomen: Aortic atherosclerosis. Subcentimeter low-attenuation lesion in segment 4B of the liver, too small to characterize, but statistically likely to represent a small cyst. Musculoskeletal: Chronic compression fracture of superior endplate of T6 with 303%loss of  anterior vertebral body height. Multiple sclerotic lesions are again noted throughout the visualized skeleton, largest of which is in the posterior aspect of the T2 vertebral body measuring 1.7 cm, compatible with metastatic disease to the bones. No definite new osseous lesions are confidently identified on today's examination. Multiple old nondisplaced to minimally displaced posterior right-sided rib fractures involving the right third, fourth, fifth, sixth and seventh ribs. IMPRESSION: 1. While a post treatment related changes of radiation fibrosis in the lungs appear stable, there are areas of increasing nodularity in the right lower lobe, most concerning of which is in the posterior aspect of the right lower lobe currently measuring 1.6 x 0.8 cm.  Further evaluation with PET-CT should be considered to exclude new metastatic lesions. 2. Widespread metastatic disease to the bones is similar to the prior study. 3. Stable small right pleural effusion lying dependently. 4.  Aortic Atherosclerosis (ICD10-I70.0). Aortic Atherosclerosis (ICD10-I70.0). Electronically Signed   By: Vinnie Langton M.D.   On: 12/29/2020 10:29   ASSESSMENT AND PLAN:  This is a very pleasant 65 years old white female with a stage IIIB non-small cell lung cancer, adenocarcinoma with negative EGFR, ALK mutations diagnosed in October 2016.  Molecular studies showed positive ROS 1 She is status post course of concurrent chemoradiation followed by consolidation chemotherapy with carboplatin and Alimta. The patient has been in observation for close to 3 years. She underwent palliative radiotherapy to the left supraclavicular lymph nodes with improvement of her disease. She also underwent SBRT to left upper lobe lung nodule as well as left supraclavicular lymphadenopathy under the care of Dr. Tammi Klippel completed in October 2021. The patient is currently on observation and she is feeling fine except for mild shortness of breath with exertion.   She was recently treated for upper respiratory infection and has no complete resolution of her symptoms. She had repeat CT scan of the chest performed recently.  I personally and independently reviewed the scan images and discussed the result and showed the images to the patient today. Her scan showed no concerning findings for disease progression except for suspicious area of increased nodularity in the right lower lobe. I recommended for the patient to continue monitoring this nodule closely with repeat CT scan of the chest in 3 months.  This could be concerning for disease recurrence but also inflammatory process could not be excluded completely with the recent upper respiratory infection. F the patient has evidence for further progression of her disease on the upcoming imaging studies, she will be considered for treatment with Entrectinib as a targeted therapy. She was advised to call immediately if she has any concerning symptoms in the interval. The patient voices understanding of current disease status and treatment options and is in agreement with the current care plan. All questions were answered. The patient knows to call the clinic with any problems, questions or concerns. We can certainly see the patient much sooner if necessary.  Disclaimer: This note was dictated with voice recognition software. Similar sounding words can inadvertently be transcribed and may not be corrected upon review.

## 2020-12-30 NOTE — Progress Notes (Signed)
I spoke with Rose Figueroa today.  She is doing well with some SOB at times in which Dr. Julien Nordmann addressed.  Her treatment plan is for follow up scan in 2-3 months but to call if her SOB gets worse.  She verbalized understanding.

## 2021-01-05 ENCOUNTER — Telehealth: Payer: Self-pay | Admitting: Internal Medicine

## 2021-01-05 NOTE — Telephone Encounter (Signed)
Scheduled per los. Called and left msg. Mailed printout  °

## 2021-01-12 DIAGNOSIS — M810 Age-related osteoporosis without current pathological fracture: Secondary | ICD-10-CM | POA: Diagnosis not present

## 2021-03-23 ENCOUNTER — Ambulatory Visit (HOSPITAL_COMMUNITY)
Admission: RE | Admit: 2021-03-23 | Discharge: 2021-03-23 | Disposition: A | Discharge status: Home / Self Care | Payer: Medicare HMO | Source: Ambulatory Visit | Attending: Internal Medicine | Admitting: Internal Medicine

## 2021-03-23 ENCOUNTER — Encounter (HOSPITAL_COMMUNITY): Payer: Self-pay

## 2021-03-23 ENCOUNTER — Inpatient Hospital Stay: Payer: Medicare HMO | Attending: Internal Medicine

## 2021-03-23 ENCOUNTER — Other Ambulatory Visit: Payer: Self-pay

## 2021-03-23 DIAGNOSIS — Z85118 Personal history of other malignant neoplasm of bronchus and lung: Secondary | ICD-10-CM | POA: Insufficient documentation

## 2021-03-23 DIAGNOSIS — R911 Solitary pulmonary nodule: Secondary | ICD-10-CM | POA: Diagnosis not present

## 2021-03-23 DIAGNOSIS — C3490 Malignant neoplasm of unspecified part of unspecified bronchus or lung: Secondary | ICD-10-CM | POA: Insufficient documentation

## 2021-03-23 DIAGNOSIS — I700 Atherosclerosis of aorta: Secondary | ICD-10-CM | POA: Diagnosis not present

## 2021-03-23 DIAGNOSIS — J90 Pleural effusion, not elsewhere classified: Secondary | ICD-10-CM | POA: Diagnosis not present

## 2021-03-23 LAB — CBC WITH DIFFERENTIAL (CANCER CENTER ONLY)
Abs Immature Granulocytes: 0.01 10*3/uL (ref 0.00–0.07)
Basophils Absolute: 0 10*3/uL (ref 0.0–0.1)
Basophils Relative: 1 %
Eosinophils Absolute: 0.2 10*3/uL (ref 0.0–0.5)
Eosinophils Relative: 4 %
HCT: 39.1 % (ref 36.0–46.0)
Hemoglobin: 13.2 g/dL (ref 12.0–15.0)
Immature Granulocytes: 0 %
Lymphocytes Relative: 28 %
Lymphs Abs: 1.2 10*3/uL (ref 0.7–4.0)
MCH: 31 pg (ref 26.0–34.0)
MCHC: 33.8 g/dL (ref 30.0–36.0)
MCV: 91.8 fL (ref 80.0–100.0)
Monocytes Absolute: 0.5 10*3/uL (ref 0.1–1.0)
Monocytes Relative: 11 %
Neutro Abs: 2.5 10*3/uL (ref 1.7–7.7)
Neutrophils Relative %: 56 %
Platelet Count: 257 10*3/uL (ref 150–400)
RBC: 4.26 MIL/uL (ref 3.87–5.11)
RDW: 13.4 % (ref 11.5–15.5)
WBC Count: 4.4 10*3/uL (ref 4.0–10.5)
nRBC: 0 % (ref 0.0–0.2)

## 2021-03-23 LAB — CMP (CANCER CENTER ONLY)
ALT: 26 U/L (ref 0–44)
AST: 27 U/L (ref 15–41)
Albumin: 4 g/dL (ref 3.5–5.0)
Alkaline Phosphatase: 48 U/L (ref 38–126)
Anion gap: 8 (ref 5–15)
BUN: 11 mg/dL (ref 8–23)
CO2: 27 mmol/L (ref 22–32)
Calcium: 9.3 mg/dL (ref 8.9–10.3)
Chloride: 102 mmol/L (ref 98–111)
Creatinine: 0.75 mg/dL (ref 0.44–1.00)
GFR, Estimated: >60 mL/min (ref >60)
Glucose, Bld: 90 mg/dL (ref 70–99)
Potassium: 4.1 mmol/L (ref 3.5–5.1)
Sodium: 137 mmol/L (ref 135–145)
Total Bilirubin: 0.5 mg/dL (ref 0.3–1.2)
Total Protein: 6.6 g/dL (ref 6.5–8.1)

## 2021-03-23 MED ORDER — IOHEXOL 350 MG/ML SOLN
60.0000 mL | Freq: Once | INTRAVENOUS | Status: AC | PRN
  Administered 2021-03-23: 60 mL via INTRAVENOUS

## 2021-03-24 ENCOUNTER — Telehealth: Payer: Self-pay

## 2021-03-24 ENCOUNTER — Other Ambulatory Visit: Payer: Medicare HMO

## 2021-03-24 NOTE — Telephone Encounter (Signed)
Call report received from [facility] regarding pts 03/23/21 Chest CT. Report printed and given directly too Dr. Julien Nordmann.

## 2021-03-30 ENCOUNTER — Other Ambulatory Visit: Payer: Medicare HMO

## 2021-04-01 ENCOUNTER — Telehealth: Payer: Self-pay

## 2021-04-01 ENCOUNTER — Encounter: Payer: Self-pay | Admitting: Internal Medicine

## 2021-04-01 ENCOUNTER — Telehealth: Payer: Self-pay | Admitting: Pharmacist

## 2021-04-01 ENCOUNTER — Other Ambulatory Visit (HOSPITAL_COMMUNITY): Payer: Self-pay

## 2021-04-01 ENCOUNTER — Inpatient Hospital Stay: Payer: Medicare HMO | Attending: Internal Medicine | Admitting: Internal Medicine

## 2021-04-01 ENCOUNTER — Telehealth: Payer: Self-pay | Admitting: Internal Medicine

## 2021-04-01 ENCOUNTER — Other Ambulatory Visit: Payer: Self-pay

## 2021-04-01 VITALS — BP 133/97 | HR 93 | Temp 97.6°F | Resp 19 | Ht 63.0 in | Wt 154.2 lb

## 2021-04-01 DIAGNOSIS — Z8719 Personal history of other diseases of the digestive system: Secondary | ICD-10-CM | POA: Diagnosis not present

## 2021-04-01 DIAGNOSIS — C770 Secondary and unspecified malignant neoplasm of lymph nodes of head, face and neck: Secondary | ICD-10-CM | POA: Diagnosis not present

## 2021-04-01 DIAGNOSIS — Z79899 Other long term (current) drug therapy: Secondary | ICD-10-CM | POA: Diagnosis not present

## 2021-04-01 DIAGNOSIS — C3431 Malignant neoplasm of lower lobe, right bronchus or lung: Secondary | ICD-10-CM | POA: Insufficient documentation

## 2021-04-01 DIAGNOSIS — C3411 Malignant neoplasm of upper lobe, right bronchus or lung: Secondary | ICD-10-CM

## 2021-04-01 DIAGNOSIS — C7951 Secondary malignant neoplasm of bone: Secondary | ICD-10-CM

## 2021-04-01 DIAGNOSIS — C3490 Malignant neoplasm of unspecified part of unspecified bronchus or lung: Secondary | ICD-10-CM

## 2021-04-01 DIAGNOSIS — Z5111 Encounter for antineoplastic chemotherapy: Secondary | ICD-10-CM

## 2021-04-01 MED ORDER — ENTRECTINIB 200 MG PO CAPS
600.0000 mg | ORAL_CAPSULE | Freq: Every day | ORAL | 3 refills | Status: DC
  Filled 2021-04-01: qty 90, 30d supply, fill #0

## 2021-04-01 NOTE — Progress Notes (Signed)
START ON PATHWAY REGIMEN - Non-Small Cell Lung     A cycle is every 28 days:     Entrectinib   **Always confirm dose/schedule in your pharmacy ordering system**  Patient Characteristics: Stage IV Metastatic, Nonsquamous, Molecular Analysis Completed, Molecular Alteration Present and Eligible for Molecular Targeted Therapy, Initial Molecular Targeted Therapy, ROS1 Fusion/Rearrangement Positive Therapeutic Status: Stage IV Metastatic Histology: Nonsquamous Cell Broad Molecular Profiling Status: Molecular Analysis Completed Molecular Analysis Results: Alteration Present and Eligible for Molecular Targeted Therapy Molecular Alteration Present: ROS1 Fusion/Rearrangement Positive Molecular Targeted Line of Therapy: Initial Molecular Targeted Therapy Intent of Therapy: Non-Curative / Palliative Intent, Discussed with Patient

## 2021-04-01 NOTE — Telephone Encounter (Addendum)
Oral Oncology Pharmacist Encounter  I met with patient for overview of new oral chemotherapy medication: Rozlytrek (entrectinib) for the treatment of metastatic non-small cell lung cancer, ROS1-positive, planned duration until disease progression or unacceptable drug toxicity.  Prescription dose and frequency assessed for appropriateness. Appropriate for therapy initiation. CBC w/ Diff and CMP from 03/23/21 assessed, no relevant lab abnormalities noted.   Counseled patient on administration, dosing, side effects, monitoring, drug-food interactions, safe handling, storage, and disposal.  Patient will take Rozlytrek 200mg capsules, 3 capsules (600mg) by mouth once daily without regard to food.  Patient knows to avoid grapefruit and grapefruit juice.  Rozlytrek start date: patient approved for manufacturer assistance as of 04/07/21. Patient notified to call and set up first shipment and start once received (phone: [phone])  Side effects include but not limited to: GI effects (such as constipation, diarrhea, nausea, taste changes), fatigue, edema, CNS effects (dizziness, cognitive dysfunction, mood disorders, sleep disturbances), CBC changes (anemia, neutropenia), electrolyte imbalances (hyponatremia, hypocalcemia, hyperuricemia), increase in liver enzymes, and visual disturbances.    Reviewed with patient importance of keeping a medication schedule and plan for any missed doses. No barriers to medication adherence identified.  Patient agreement for treatment documented in MD note on 04/01/21.  Medication reconciliation performed and medication/allergy list updated. Current medication list in Epic reviewed, no relevant/significant DDIs with Rozlytrek identified.  All questions answered.  Ms. Klahn voiced understanding and appreciation.   Medication education handout given to patient. Patient knows to call the office with questions or concerns. Oral Chemotherapy Clinic phone number provided  to patient.   Rebecca Fanning, PharmD, BCPS Hematology/Oncology Clinical Pharmacist [facility] [phone] 04/01/2021 11:14 AM     

## 2021-04-01 NOTE — Telephone Encounter (Signed)
Scheduled appt per 9/8 los - unable to reach pt . Left message for patient with appt date and time

## 2021-04-01 NOTE — Progress Notes (Signed)
.      [facility] Telephone:([phone]) [phone]   Fax:(336) [phone]  OFFICE PROGRESS NOTE  Rose Dials, MD [place] [address]  DIAGNOSIS: Stage IIIB (T2a, N3, M0) non-small cell lung cancer, adenocarcinoma with negative EGFR mutation and negative ALK gene translocation diagnosed in October 2016.  Biomarker Findings Microsatellite status - MS-Stable Tumor Mutational Burden - TMB-Low (5 Muts/Mb) Genomic Findings For a complete list of the genes assayed, please refer to the Appendix. ROS1 SDC4-ROS1 fusion RBM10 N582f*14 TP53 R273C 7 Disease relevant genes with no reportable alterations: EGFR, KRAS, ALK, BRAF, MET, RET, ERBB2  PRIOR THERAPY:  1) Concurrent chemoradiation with weekly carboplatin for AUC of 2 and paclitaxel 45 MG/M2 status post 6 cycles. Last dose was given 07/13/2015 with partial response. 2) Systemic chemotherapy with carboplatin for AUC of 5 and Alimta 500 MG/M2 every 3 weeks. First dose 11/02/2015. Status post 6 cycles. 3) palliative stereotactic radiotherapy to the left supraclavicular lymph node under the care of Dr. MTammi Klippel  Loss of fraction of radiotherapy scheduled for November 01, 2017 4) palliative radiotherapy to the left supraclavicular lymph node as well as left upper lobe lung mass completed May 04, 2020 under the care of Dr. MTammi Klippel  CURRENT THERAPY: Entrectinib 600 mg p.o. daily.  Expected to start in the next few days.  INTERVAL HISTORY: Rose GLENDENING65y.o. female returns to the clinic today for follow-up visit.  The patient is feeling fine today with no concerning complaints except for mild shortness of breath and right-sided chest pain.  She denied having any current cough or hemoptysis.  She denied having any recent weight loss or night sweats.  She has no nausea, vomiting, diarrhea or constipation.  She has no headache or visual changes.  She has no fever or chills.  She is here today for evaluation with repeat  CT scan of the chest for restaging of her disease.  Her brother was recently diagnosed with non-small cell lung cancer, squamous cell carcinoma likely stage III and he is currently undergoing concurrent chemoradiation.  MEDICAL HISTORY: Past Medical History:  Diagnosis Date   Age related osteoporosis 12/18/2020   Anxiety    Atherosclerosis 12/18/2020   Back pain    right back lateral   BCC (basal cell carcinoma of skin)    Bone metastasis ([facility] 10/04/2017   Cancer of upper lobe of right lung ([facility] 05/19/2015   Cardiac murmur 04/24/2020   Chest discomfort 04/24/2020   Chronic pain 12/18/2020   Colon polyps    Complication of anesthesia    pt. states that she is difficult to arouse   Coronary atherosclerosis 04/24/2020   Diverticulosis    Dysrhythmia    "skip beat in early 20's"   Encounter for antineoplastic chemotherapy 06/08/2015   Generalized rash 06/24/2015   H/O cold sores    History of anemia    Lung cancer (HCC)    RUL mass -being tx- radiation and chemo- remains last chemo 01-04-16   Lung field abnormal 12/18/2020   Metastasis to supraclavicular lymph node ([facility] 10/04/2017   Metastatic non-small cell lung cancer ([facility] 03/24/2020   Mixed dyslipidemia 04/24/2020   Oral lesion 06/24/2015   Osteoporosis    Personal history of colonic polyps 12/18/2020   PONV (postoperative nausea and vomiting)    Port catheter in place 11/20/2015   Rectal bleeding    right upper lobe lung mass 04/15/2015   malignant tumor found - 10'16-radiation and chemotherapy  and remains with chemo at present- Dr. Earlie Server follows.   Sciatica 12/18/2020   Vitamin D deficiency 12/18/2020    ALLERGIES:  has No Known Allergies.  MEDICATIONS:  Current Outpatient Medications  Medication Sig Dispense Refill   albuterol (VENTOLIN HFA) 108 (90 Base) MCG/ACT inhaler Inhale 2 puffs into the lungs as needed for wheezing or shortness of breath.     atorvastatin (LIPITOR) 10 MG tablet Take 10 mg by mouth daily.     Biotin  10000 MCG TABS Take 10,000 mcg by mouth daily in the afternoon.     Calcium Carb-Cholecalciferol (CALCIUM 1000 + D PO) Take 3 tablets by mouth 2 (two) times daily. D3 = " 25 mcg"     cetirizine (ZYRTEC) 10 MG tablet Take 10 mg by mouth daily.     L-LYSINE PO Take 1,000 mg by mouth daily.      Magnesium 400 MG CAPS Take 1 capsule by mouth daily.     Melatonin 3 MG CAPS Take 1 capsule by mouth at bedtime.     Multiple Vitamins-Minerals (PRESERVISION/LUTEIN PO) Take 1 capsule by mouth daily.     nitroGLYCERIN (NITROSTAT) 0.4 MG SL tablet Place 0.4 mg under the tongue every 5 (five) minutes as needed for chest pain.     Omega-3 Fatty Acids (RA FISH OIL) 1000 MG CPDR Take 1 capsule by mouth daily.      PREBIOTIC PRODUCT PO Take 1 tablet by mouth daily.     Probiotic Product (PROBIOTIC DAILY PO) Take 1 capsule by mouth daily.     Psyllium (REGULOID PO) Take 750 mg by mouth daily.     Turmeric Curcumin 500 MG CAPS Take 1 each by mouth daily.      vitamin B-12 (CYANOCOBALAMIN) 1000 MCG tablet Take 1,000 mcg by mouth daily.     No current facility-administered medications for this visit.    SURGICAL HISTORY:  Past Surgical History:  Procedure Laterality Date   BREAST EXCISIONAL BIOPSY Right    BREAST EXCISIONAL BIOPSY Right    BREAST EXCISIONAL BIOPSY Right    COLONOSCOPY     COLONOSCOPY W/ POLYPECTOMY     COLONOSCOPY WITH PROPOFOL N/A 01/20/2016   Procedure: COLONOSCOPY WITH PROPOFOL;  Surgeon: Arta Silence, MD;  Location: [facility];  Service: Endoscopy;  Laterality: N/A;   CYSTECTOMY     right breast x 2   IR REMOVAL TUN ACCESS W/ PORT W/O FL MOD SED  12/12/2018   MEDIASTINOSCOPY N/A 05/07/2015   Procedure: MEDIASTINOSCOPY;  Surgeon: Ivin Poot, MD;  Location: [facility];  Service: Thoracic;  Laterality: N/A;   TONSILLECTOMY     VIDEO BRONCHOSCOPY WITH ENDOBRONCHIAL ULTRASOUND N/A 05/07/2015   Procedure: VIDEO BRONCHOSCOPY WITH ENDOBRONCHIAL ULTRASOUND;  Surgeon: Ivin Poot, MD;   Location: [facility];  Service: Thoracic;  Laterality: N/A;    REVIEW OF SYSTEMS:  Constitutional: positive for fatigue Eyes: negative Ears, nose, mouth, throat, and face: negative Respiratory: positive for dyspnea on exertion and pleurisy/chest pain Cardiovascular: negative Gastrointestinal: negative Genitourinary:negative Integument/breast: negative Hematologic/lymphatic: negative Musculoskeletal:negative Neurological: negative Behavioral/Psych: negative Endocrine: negative Allergic/Immunologic: negative   PHYSICAL EXAMINATION: General appearance: alert, cooperative, fatigued and no distress Head: Normocephalic, without obvious abnormality, atraumatic Neck: no adenopathy, no JVD, supple, symmetrical, trachea midline and thyroid not enlarged, symmetric, no tenderness/mass/nodules Lymph nodes: Cervical, supraclavicular, and axillary nodes normal. Resp: clear to auscultation bilaterally Back: symmetric, no curvature. ROM normal. No CVA tenderness. Cardio: regular rate and rhythm, S1, S2 normal, no murmur, click, rub or  gallop GI: soft, non-tender; bowel sounds normal; no masses,  no organomegaly Extremities: extremities normal, atraumatic, no cyanosis or edema Neurological exam unremarkable  ECOG PERFORMANCE STATUS: 1 - Symptomatic but completely ambulatory  Blood pressure (!) 133/97, pulse 93, temperature 97.6 F (36.4 C), temperature source Tympanic, resp. rate 19, height 5' 3"  (1.6 m), weight 154 lb 3.2 oz (69.9 kg), SpO2 99 %.  LABORATORY DATA: Lab Results  Component Value Date   WBC 4.4 03/23/2021   HGB 13.2 03/23/2021   HCT 39.1 03/23/2021   MCV 91.8 03/23/2021   PLT 257 03/23/2021      Chemistry      Component Value Date/Time   NA 137 03/23/2021 0957   NA 139 03/20/2017 1038   K 4.1 03/23/2021 0957   K 4.3 03/20/2017 1038   CL 102 03/23/2021 0957   CO2 27 03/23/2021 0957   CO2 27 03/20/2017 1038   BUN 11 03/23/2021 0957   BUN 15.4 03/20/2017 1038   CREATININE  0.75 03/23/2021 0957   CREATININE 0.7 03/20/2017 1038      Component Value Date/Time   CALCIUM 9.3 03/23/2021 0957   CALCIUM 9.8 03/20/2017 1038   ALKPHOS 48 03/23/2021 0957   ALKPHOS 37 (L) 03/20/2017 1038   AST 27 03/23/2021 0957   AST 25 03/20/2017 1038   ALT 26 03/23/2021 0957   ALT 22 03/20/2017 1038   BILITOT 0.5 03/23/2021 0957   BILITOT 0.42 03/20/2017 1038       RADIOGRAPHIC STUDIES: CT Chest W Contrast  Result Date: 03/24/2021 CLINICAL DATA:  Primary Cancer Type: Lung Imaging Indication: Routine surveillance Interval therapy since last imaging? No Initial Cancer Diagnosis Date: 05/07/2015; Established by: Biopsy-proven Detailed Pathology: Stage IIIB non-small cell lung cancer, adenocarcinoma. Primary Tumor location: Right upper lobe. Surgeries: No. Chemotherapy: Yes; Ongoing?  No; Most recent administration: 2017 Immunotherapy? No Radiation therapy? Yes Date Range: 04/21/2020 - 05/04/2020; Target: left supraclavicular lymph node and left upper lobe Date Range: 10/19/2017 - 11/01/2017; Target: left chest and thoracic spine Date Range: 06/01/2015 - 07/16/2015; Target: Right upper lobe EXAM: CT CHEST WITH CONTRAST TECHNIQUE: Multidetector CT imaging of the chest was performed during intravenous contrast administration. CONTRAST:  20m OMNIPAQUE IOHEXOL 350 MG/ML SOLN COMPARISON:  Multiple priors, including the most recent CT chest 12/28/2020. 04/01/2020 PET-CT. FINDINGS: Cardiovascular: Heart size is normal. Trace volume of pericardial fluid, unlikely to be of any hemodynamic significance at this time. No pericardial calcification. There is aortic atherosclerosis, as well as atherosclerosis of the great vessels of the mediastinum and the coronary arteries, including calcified atherosclerotic plaque in the left anterior descending coronary artery. Mediastinum/Nodes: No pathologically enlarged mediastinal or hilar lymph nodes. Esophagus is unremarkable in appearance. No axillary  lymphadenopathy. Lungs/Pleura: The pleural based nodule of concern on the prior examination has enlarged on today's study, currently measuring 2.8 x 1.1 x 1.4 cm (axial image 122 of series 2 and sagittal image 65 of series 7), demonstrating clear evidence of internal enhancement and adjacent pleural thickening and enhancement along the visceral pleura tracking to the medial aspect of the right hemithorax. Notably, there are now multiple areas of subtle pleural nodularity and enhancement, most evident in the base of the right hemithorax, particularly along the surface of the left hemidiaphragm (best example is on axial image 136 of series 2 where there is a 3.6 x 1.0 cm sessile mass). Small to moderate right pleural effusion has increased in size compared to the prior study, presumably malignant. Previously noted chronic mass-like areas  of architectural distortion in the central aspect of the right lung, most evident in the right upper lobe, appear stable compared to prior studies, most compatible with areas of chronic postradiation mass-like fibrosis. Fibrotic changes are also present to a lesser extent in the central aspect of the suprahilar left upper lobe. No other definite new suspicious appearing pulmonary nodules or masses are noted. No left pleural effusion. Upper Abdomen: Diffuse low attenuation throughout the visualized hepatic parenchyma, indicative of hepatic steatosis. Musculoskeletal: Chronic T6 compression fracture with 30% loss of anterior vertebral body height is unchanged. Multiple sclerotic lesions are again noted throughout the visualized axial and appendicular skeleton, largest of which is again in the posterior aspect of the T2 vertebral body measuring up to 1.7 cm in diameter. No definite new osseous lesions are confidently identified on today's examination. Multiple old nondisplaced and minimally displaced fractures of the posterior aspects of the right third through seventh ribs, similar to  the prior examination. IMPRESSION: 1. Previously noted right lower lobe pulmonary nodule has clearly enlarged, currently measuring 2.8 x 1.1 x 1.4 cm, now associated with areas of nodular and mass-like pleural thickening and enhancement, and enlarging small to moderate-sized right pleural effusion which is presumably malignant. Once again, further evaluation with PET-CT is suggested if clinically appropriate. 2. Sclerotic lesions in the bone appears similar to the prior study, likely reflecting treated metastatic disease. 3. Aortic atherosclerosis, in addition to left anterior descending coronary artery disease. 4. Hepatic steatosis. These results will be called to the ordering clinician or representative by the Radiologist Assistant, and communication documented in the PACS or [employer]. Electronically Signed   By: Vinnie Langton M.D.   On: 03/24/2021 10:19    ASSESSMENT AND PLAN:  This is a very pleasant 65 years old white female with metastatic non-small cell lung cancer initially diagnosed as stage IIIB non-small cell lung cancer, adenocarcinoma with negative EGFR, ALK mutations diagnosed in October 2016.  Molecular studies showed positive ROS 1 She is status post course of concurrent chemoradiation followed by consolidation chemotherapy with carboplatin and Alimta. The patient has been in observation for close to 3 years. She underwent palliative radiotherapy to the left supraclavicular lymph nodes with improvement of her disease. She also underwent SBRT to left upper lobe lung nodule as well as left supraclavicular lymphadenopathy under the care of Dr. Tammi Klippel completed in October 2021. She is currently on observation and she is doing fine with no concerning complaints except for mild shortness of breath as well as intermittent right-sided chest pain. She had repeat CT scan of the chest performed recently.  I personally and independently reviewed the scan images and discussed the result and  showed the images to the patient today. Her scan showed enlargement of the right lower lobe pulmonary nodule with masslike pleural thickening and enhancement as well as enlarging small to moderate size right pleural effusion suspicious to be malignant. The patient continues to have stable sclerotic bone lesions. I had a lengthy discussion with the patient today about her current condition and treatment options. I explained to the patient that it may be time to consider her for systemic therapy because of the progressive nature of her disease.  Likely she has ROS1 fusion.  I will consider The patient for treatment with Entrectinib 600 mg p.o. daily.  The patient was also giving the option of continuous observation and monitoring but she is interested in proceeding with the treatment. I discussed with her the adverse effect of this treatment  and she will be seen by the pharmacist for oral oncolytic for more education as well as helping the patient obtaining the medication. She is expected to start this medication in the next few days. I will see her back for follow-up visit in 3 weeks for evaluation and repeat blood work. For the recurrent right pleural effusion, will arrange for the patient to have diagnostic as well as therapeutic ultrasound-guided right thoracentesis. The patient was advised to call immediately if she has any other concerning symptoms in the interval. The patient voices understanding of current disease status and treatment options and is in agreement with the current care plan. All questions were answered. The patient knows to call the clinic with any problems, questions or concerns. We can certainly see the patient much sooner if necessary.  Disclaimer: This note was dictated with voice recognition software. Similar sounding words can inadvertently be transcribed and may not be corrected upon review.

## 2021-04-01 NOTE — Telephone Encounter (Signed)
Oral Oncology Patient Advocate Encounter   Received notification from [place] that prior authorization for [facility] is required.   PA submitted on CoverMyMeds Key [id number] Status is pending   Oral Oncology Clinic will continue to follow.  [place] Patient [place] Phone [phone] Fax [phone] 04/01/2021 11:04 AM

## 2021-04-01 NOTE — Addendum Note (Signed)
Addended by: Ardeen Garland on: 04/01/2021 12:19 PM   Modules accepted: Orders

## 2021-04-02 ENCOUNTER — Other Ambulatory Visit: Payer: Self-pay | Admitting: Family Medicine

## 2021-04-02 ENCOUNTER — Telehealth: Payer: Self-pay

## 2021-04-02 ENCOUNTER — Other Ambulatory Visit (HOSPITAL_COMMUNITY): Payer: Self-pay

## 2021-04-02 DIAGNOSIS — Z1231 Encounter for screening mammogram for malignant neoplasm of breast: Secondary | ICD-10-CM

## 2021-04-02 NOTE — Telephone Encounter (Signed)
Oral Oncology Patient Advocate Encounter  Met patient in lobby room to complete application for [place] in an effort to reduce patient's out of pocket expense for Rozlytrek to $0.    Application completed and faxed to [phone].   [employer] patient assistance phone number for follow up is [phone].   This encounter will be updated until final determination.   [name] [name] Phone [phone] Fax [phone] 04/02/2021 2:05 PM

## 2021-04-02 NOTE — Telephone Encounter (Signed)
Oral Oncology Patient Advocate Encounter  Prior Authorization for [facility] has been approved.    PA# [id number] Effective dates: 07/25/20 through 07/24/21  Patients co-pay is $3402  Oral Oncology Clinic will continue to follow.   [place] Patient [facility] Phone [phone] Fax [phone] 04/02/2021 3:19 PM

## 2021-04-05 ENCOUNTER — Telehealth: Payer: Self-pay | Admitting: Medical Oncology

## 2021-04-05 NOTE — Telephone Encounter (Signed)
Clarified to pt that she is scheduled for US guided thoracentesis on wed.  She was confused because she got a letter from insurance for approval of a CT C/A/P. I told her there is no order for CT C/A/P.

## 2021-04-07 ENCOUNTER — Ambulatory Visit (HOSPITAL_COMMUNITY)
Admission: RE | Admit: 2021-04-07 | Discharge: 2021-04-07 | Disposition: A | Discharge status: Home / Self Care | Payer: Medicare HMO | Source: Ambulatory Visit | Attending: Internal Medicine | Admitting: Internal Medicine

## 2021-04-07 ENCOUNTER — Telehealth: Payer: Self-pay | Admitting: Medical Oncology

## 2021-04-07 ENCOUNTER — Ambulatory Visit (HOSPITAL_COMMUNITY)
Admission: RE | Admit: 2021-04-07 | Discharge: 2021-04-07 | Disposition: A | Discharge status: Home / Self Care | Payer: Medicare HMO | Source: Ambulatory Visit | Attending: Student | Admitting: Student

## 2021-04-07 ENCOUNTER — Other Ambulatory Visit: Payer: Self-pay

## 2021-04-07 DIAGNOSIS — C770 Secondary and unspecified malignant neoplasm of lymph nodes of head, face and neck: Secondary | ICD-10-CM | POA: Insufficient documentation

## 2021-04-07 DIAGNOSIS — J90 Pleural effusion, not elsewhere classified: Secondary | ICD-10-CM | POA: Diagnosis not present

## 2021-04-07 DIAGNOSIS — Z9889 Other specified postprocedural states: Secondary | ICD-10-CM | POA: Insufficient documentation

## 2021-04-07 DIAGNOSIS — C3411 Malignant neoplasm of upper lobe, right bronchus or lung: Secondary | ICD-10-CM

## 2021-04-07 DIAGNOSIS — C384 Malignant neoplasm of pleura: Secondary | ICD-10-CM | POA: Diagnosis not present

## 2021-04-07 DIAGNOSIS — C7951 Secondary malignant neoplasm of bone: Secondary | ICD-10-CM | POA: Insufficient documentation

## 2021-04-07 DIAGNOSIS — Z85118 Personal history of other malignant neoplasm of bronchus and lung: Secondary | ICD-10-CM | POA: Diagnosis not present

## 2021-04-07 MED ORDER — LIDOCAINE HCL 1 % IJ SOLN
INTRAMUSCULAR | Status: AC
  Filled 2021-04-07: qty 20

## 2021-04-07 NOTE — Telephone Encounter (Signed)
LVM to return my call °

## 2021-04-07 NOTE — Procedures (Signed)
PROCEDURE SUMMARY:  Successful US guided right thoracentesis. Yielded 200 of yellow fluid. Pt tolerated procedure well. No immediate complications.  Specimen was sent for labs. CXR ordered.  EBL < 5 mL  Antuan Limes PA-C 04/07/2021 4:53 PM

## 2021-04-08 ENCOUNTER — Telehealth: Payer: Self-pay | Admitting: Medical Oncology

## 2021-04-08 LAB — CYTOLOGY - NON PAP

## 2021-04-08 NOTE — Telephone Encounter (Signed)
Pt has not received her Rozlytrek. She may not  receive it until next week so her f/u appt will be scheduled 1-2 weeks after she starts the medication.

## 2021-04-08 NOTE — Telephone Encounter (Signed)
Patient is approved for Rozlytrek at no cost from [employer] 04/07/21-until  [employer] uses [place] Patient [place] Phone [phone] Fax [phone] 04/08/2021 7:08 AM

## 2021-04-12 ENCOUNTER — Other Ambulatory Visit: Payer: Self-pay | Admitting: Medical Oncology

## 2021-04-12 ENCOUNTER — Telehealth: Payer: Self-pay | Admitting: Medical Oncology

## 2021-04-12 DIAGNOSIS — C3490 Malignant neoplasm of unspecified part of unspecified bronchus or lung: Secondary | ICD-10-CM

## 2021-04-12 MED ORDER — ENTRECTINIB 200 MG PO CAPS
600.0000 mg | ORAL_CAPSULE | Freq: Every day | ORAL | 3 refills | Status: DC
Start: 2021-04-12 — End: 2021-07-12

## 2021-04-12 NOTE — Telephone Encounter (Signed)
Confirmed that pt has pt assistance for entrectinib from [place] and to escribe rx to [facility]. Done

## 2021-04-15 ENCOUNTER — Other Ambulatory Visit: Payer: Self-pay | Admitting: Cardiology

## 2021-04-21 ENCOUNTER — Ambulatory Visit: Payer: Medicare HMO | Admitting: Internal Medicine

## 2021-04-21 ENCOUNTER — Other Ambulatory Visit: Payer: Medicare HMO

## 2021-05-06 ENCOUNTER — Inpatient Hospital Stay: Payer: Medicare HMO

## 2021-05-06 ENCOUNTER — Encounter: Payer: Self-pay | Admitting: Internal Medicine

## 2021-05-06 ENCOUNTER — Inpatient Hospital Stay: Payer: Medicare HMO | Attending: Internal Medicine | Admitting: Internal Medicine

## 2021-05-06 ENCOUNTER — Other Ambulatory Visit: Payer: Self-pay

## 2021-05-06 VITALS — BP 113/83 | HR 95 | Temp 97.7°F | Resp 19 | Ht 63.0 in | Wt 159.5 lb

## 2021-05-06 DIAGNOSIS — Z79899 Other long term (current) drug therapy: Secondary | ICD-10-CM | POA: Diagnosis not present

## 2021-05-06 DIAGNOSIS — C3412 Malignant neoplasm of upper lobe, left bronchus or lung: Secondary | ICD-10-CM | POA: Insufficient documentation

## 2021-05-06 DIAGNOSIS — C3411 Malignant neoplasm of upper lobe, right bronchus or lung: Secondary | ICD-10-CM

## 2021-05-06 DIAGNOSIS — C770 Secondary and unspecified malignant neoplasm of lymph nodes of head, face and neck: Secondary | ICD-10-CM | POA: Diagnosis not present

## 2021-05-06 DIAGNOSIS — C7951 Secondary malignant neoplasm of bone: Secondary | ICD-10-CM

## 2021-05-06 DIAGNOSIS — J910 Malignant pleural effusion: Secondary | ICD-10-CM | POA: Insufficient documentation

## 2021-05-06 DIAGNOSIS — Z5111 Encounter for antineoplastic chemotherapy: Secondary | ICD-10-CM

## 2021-05-06 DIAGNOSIS — Z923 Personal history of irradiation: Secondary | ICD-10-CM | POA: Diagnosis not present

## 2021-05-06 DIAGNOSIS — C3490 Malignant neoplasm of unspecified part of unspecified bronchus or lung: Secondary | ICD-10-CM

## 2021-05-06 LAB — CBC WITH DIFFERENTIAL (CANCER CENTER ONLY)
Abs Immature Granulocytes: 0.01 10*3/uL (ref 0.00–0.07)
Basophils Absolute: 0.1 10*3/uL (ref 0.0–0.1)
Basophils Relative: 1 %
Eosinophils Absolute: 0.1 10*3/uL (ref 0.0–0.5)
Eosinophils Relative: 3 %
HCT: 36.1 % (ref 36.0–46.0)
Hemoglobin: 12.5 g/dL (ref 12.0–15.0)
Immature Granulocytes: 0 %
Lymphocytes Relative: 30 %
Lymphs Abs: 1.2 10*3/uL (ref 0.7–4.0)
MCH: 30.7 pg (ref 26.0–34.0)
MCHC: 34.6 g/dL (ref 30.0–36.0)
MCV: 88.7 fL (ref 80.0–100.0)
Monocytes Absolute: 0.4 10*3/uL (ref 0.1–1.0)
Monocytes Relative: 10 %
Neutro Abs: 2.3 10*3/uL (ref 1.7–7.7)
Neutrophils Relative %: 56 %
Platelet Count: 313 10*3/uL (ref 150–400)
RBC: 4.07 MIL/uL (ref 3.87–5.11)
RDW: 14.6 % (ref 11.5–15.5)
WBC Count: 4.1 10*3/uL (ref 4.0–10.5)
nRBC: 0 % (ref 0.0–0.2)

## 2021-05-06 LAB — CMP (CANCER CENTER ONLY)
ALT: 32 U/L (ref 0–44)
AST: 39 U/L (ref 15–41)
Albumin: 4 g/dL (ref 3.5–5.0)
Alkaline Phosphatase: 38 U/L (ref 38–126)
Anion gap: 4 — ABNORMAL LOW (ref 5–15)
BUN: 21 mg/dL (ref 8–23)
CO2: 34 mmol/L — ABNORMAL HIGH (ref 22–32)
Calcium: 9 mg/dL (ref 8.9–10.3)
Chloride: 100 mmol/L (ref 98–111)
Creatinine: 0.98 mg/dL (ref 0.44–1.00)
GFR, Estimated: >60 mL/min (ref >60)
Glucose, Bld: 102 mg/dL — ABNORMAL HIGH (ref 70–99)
Potassium: 4 mmol/L (ref 3.5–5.1)
Sodium: 138 mmol/L (ref 135–145)
Total Bilirubin: 0.6 mg/dL (ref 0.3–1.2)
Total Protein: 6.2 g/dL — ABNORMAL LOW (ref 6.5–8.1)

## 2021-05-06 LAB — URIC ACID: Uric Acid, Serum: 4.5 mg/dL (ref 2.5–7.1)

## 2021-05-06 NOTE — Progress Notes (Signed)
[place] [phone]) [phone]   [phone]  OFFICE PROGRESS NOTE  Rose Dials, MD [place] [address]  DIAGNOSIS: Stage IIIB (T2a, N3, M0) non-small cell lung cancer, adenocarcinoma with negative EGFR mutation and negative ALK gene translocation diagnosed in October 2016.  Biomarker Findings Microsatellite status - MS-Stable Tumor Mutational Burden - TMB-Low (5 Muts/Mb) Genomic Findings For a complete list of the genes assayed, please refer to the Appendix. ROS1 SDC4-ROS1 fusion RBM10 N547f*14 TP53 R273C 7 Disease relevant genes with no reportable alterations: EGFR, KRAS, ALK, BRAF, MET, RET, ERBB2  PRIOR THERAPY:  1) Concurrent chemoradiation with weekly carboplatin for AUC of 2 and paclitaxel 45 MG/M2 status post 6 cycles. Last dose was given 07/13/2015 with partial response. 2) Systemic chemotherapy with carboplatin for AUC of 5 and Alimta 500 MG/M2 every 3 weeks. First dose 11/02/2015. Status post 6 cycles. 3) palliative stereotactic radiotherapy to the left supraclavicular lymph node under the care of Dr. MTammi Klippel  Loss of fraction of radiotherapy scheduled for November 01, 2017 4) palliative radiotherapy to the left supraclavicular lymph node as well as left upper lobe lung mass completed May 04, 2020 under the care of Dr. MTammi Klippel  CURRENT THERAPY: Entrectinib 600 mg p.o. daily.  Started April 23, 2021.  INTERVAL HISTORY: Rose PIO65y.o. female returns to the clinic today for follow-up visit.  The patient has been complaining of increasing fatigue and weakness as well as feeling numb all over her body since starting the treatment with Entrectinib.  She denied having any current chest pain which significant improved since starting treatment.  She denied having any fever or chills.  She has no nausea, vomiting, diarrhea or constipation.  She has no significant headache or visual changes.  She has weakness and  occasional imbalance when walking.  She is here today for evaluation and repeat blood work.  MEDICAL HISTORY: Past Medical History:  Diagnosis Date   Age related osteoporosis 12/18/2020   Anxiety    Atherosclerosis 12/18/2020   Back pain    right back lateral   BCC (basal cell carcinoma of skin)    [place] [place]/04[place]   [place] 05/[place]   Cardiac murmur 04/24/2020   Chest discomfort 04/24/2020   Chronic pain 12/18/2020   Colon polyps    Complication of anesthesia    pt. states that she is difficult to arouse   Coronary atherosclerosis 04/24/2020   Diverticulosis    Dysrhythmia    "skip beat in early 20's"   Encounter for antineoplastic chemotherapy 06/08/2015   Generalized rash 06/24/2015   H/O cold sores    History of anemia    Lung cancer (HCC)    RUL mass -being tx- radiation and chemo- remains last chemo 01-04-16   Lung field abnormal 12/18/2020   [place]lavicular [place] node [place] 10/04/2017   Metastatic non-small cell lung cancer ([facility] 03/24/2020   Mixed dyslipidemia 04/24/2020   Oral lesion 06/24/2015   Osteoporosis    Personal history of colonic polyps 12/18/2020   PONV (postoperative nausea and vomiting)    Port catheter in place 11/20/2015   Rectal bleeding    right upper lobe lung mass 04/15/2015   malignant tumor found - 10'16-radiation and chemotherapy and remains with chemo at present- Dr. MEarlie Serverfollows.   Sciatica 12/18/2020   Vitamin D deficiency 12/18/2020    ALLERGIES:  has No Known Allergies.  MEDICATIONS:  Current Outpatient Medications  Medication Sig Dispense Refill   albuterol (VENTOLIN HFA) 108 (90 Base) MCG/ACT inhaler Inhale 2 puffs into the lungs as needed for wheezing or shortness of breath.     atorvastatin (LIPITOR) 10 MG tablet TAKE 1 TABLET BY MOUTH EVERY DAY 90 tablet 1   Biotin 10000 MCG TABS Take 10,000 mcg by mouth daily in the afternoon.     Calcium Carb-Cholecalciferol (CALCIUM  1000 + D PO) Take 3 tablets by mouth 2 (two) times daily. D3 = " 25 mcg"     cetirizine (ZYRTEC) 10 MG tablet Take 10 mg by mouth daily.     denosumab (PROLIA) 60 MG/ML SOSY injection Inject 60 mg into the skin every 6 (six) months.     entrectinib (ROZLYTREK) 200 MG capsule Take 3 capsules (600 mg total) by mouth daily. Swallow capsules whole. Do not open, crush, chew, or dissolve the contents. 90 capsule 3   L-LYSINE PO Take 1,000 mg by mouth daily.      Melatonin 3 MG CAPS Take 1 capsule by mouth at bedtime.     Multiple Vitamins-Minerals (PRESERVISION/LUTEIN PO) Take 1 capsule by mouth daily.     niacin 500 MG tablet Take 1 tablet by mouth daily.     nitroGLYCERIN (NITROSTAT) 0.4 MG SL tablet Place 0.4 mg under the tongue every 5 (five) minutes as needed for chest pain.     Omega-3 Fatty Acids (RA FISH OIL) 1000 MG CPDR Take 1 capsule by mouth daily.      PREBIOTIC PRODUCT PO Take 1 tablet by mouth daily.     Probiotic Product (PROBIOTIC DAILY PO) Take 1 capsule by mouth daily.     Psyllium (REGULOID PO) Take 750 mg by mouth daily.     Turmeric Curcumin 500 MG CAPS Take 1 each by mouth daily.      vitamin B-12 (CYANOCOBALAMIN) 1000 MCG tablet Take 1,000 mcg by mouth daily.     No current facility-administered medications for this visit.    SURGICAL HISTORY:  Past Surgical History:  Procedure Laterality Date   BREAST EXCISIONAL BIOPSY Right    BREAST EXCISIONAL BIOPSY Right    BREAST EXCISIONAL BIOPSY Right    COLONOSCOPY     COLONOSCOPY W/ POLYPECTOMY     COLONOSCOPY WITH PROPOFOL N/A 01/20/2016   Procedure: COLONOSCOPY WITH PROPOFOL;  Surgeon: Arta Silence, MD;  Location: [facility];  Service: Endoscopy;  Laterality: N/A;   CYSTECTOMY     right breast x 2   IR REMOVAL TUN ACCESS W/ PORT W/O FL MOD SED  12/12/2018   MEDIASTINOSCOPY N/A 05/07/2015   Procedure: MEDIASTINOSCOPY;  Surgeon: Ivin Poot, MD;  Location: [facility];  Service: Thoracic;  Laterality: N/A;   TONSILLECTOMY      VIDEO BRONCHOSCOPY WITH ENDOBRONCHIAL ULTRASOUND N/A 05/07/2015   Procedure: VIDEO BRONCHOSCOPY WITH ENDOBRONCHIAL ULTRASOUND;  Surgeon: Ivin Poot, MD;  Location: [facility];  Service: Thoracic;  Laterality: N/A;    REVIEW OF SYSTEMS:  Constitutional: positive for fatigue Eyes: negative Ears, nose, mouth, throat, and face: negative Respiratory: negative Cardiovascular: negative Gastrointestinal: negative Genitourinary:negative Integument/breast: negative Hematologic/lymphatic: negative Musculoskeletal:positive for arthralgias and myalgias Neurological: positive for gait problems and paresthesia Behavioral/Psych: negative Endocrine: negative Allergic/Immunologic: negative   PHYSICAL EXAMINATION: General appearance: alert, cooperative, fatigued and no distress Head: Normocephalic, without obvious abnormality, atraumatic Neck: no adenopathy, no JVD, supple, symmetrical, trachea midline and thyroid not enlarged, symmetric, no tenderness/mass/nodules Lymph nodes: Cervical, supraclavicular, and axillary nodes normal. Resp:  clear to auscultation bilaterally Back: symmetric, no curvature. ROM normal. No CVA tenderness. Cardio: regular rate and rhythm, S1, S2 normal, no murmur, click, rub or gallop GI: soft, non-tender; bowel sounds normal; no masses,  no organomegaly Extremities: extremities normal, atraumatic, no cyanosis or edema Neurological exam unremarkable  ECOG PERFORMANCE STATUS: 1 - Symptomatic but completely ambulatory  Blood pressure 113/83, pulse 95, temperature 97.7 F (36.5 C), temperature source Tympanic, resp. rate 19, height 5' 3"  (1.6 m), weight 159 lb 8 oz (72.3 kg), SpO2 95 %.  LABORATORY DATA: Lab Results  Component Value Date   WBC 4.1 05/06/2021   HGB 12.5 05/06/2021   HCT 36.1 05/06/2021   MCV 88.7 05/06/2021   PLT 313 05/06/2021      Chemistry      Component Value Date/Time   NA 137 03/23/2021 0957   NA 139 03/20/2017 1038   K 4.1 03/23/2021  0957   K 4.3 03/20/2017 1038   CL 102 03/23/2021 0957   CO2 27 03/23/2021 0957   CO2 27 03/20/2017 1038   BUN 11 03/23/2021 0957   BUN 15.4 03/20/2017 1038   CREATININE 0.75 03/23/2021 0957   CREATININE 0.7 03/20/2017 1038      Component Value Date/Time   CALCIUM 9.3 03/23/2021 0957   CALCIUM 9.8 03/20/2017 1038   ALKPHOS 48 03/23/2021 0957   ALKPHOS 37 (L) 03/20/2017 1038   AST 27 03/23/2021 0957   AST 25 03/20/2017 1038   ALT 26 03/23/2021 0957   ALT 22 03/20/2017 1038   BILITOT 0.5 03/23/2021 0957   BILITOT 0.42 03/20/2017 1038       RADIOGRAPHIC STUDIES: DG Chest 1 View  Result Date: 04/07/2021 CLINICAL DATA:  History of lung carcinoma and status post right thoracentesis. EXAM: CHEST  1 VIEW COMPARISON:  CT of the chest on 03/23/2021 FINDINGS: The heart size and mediastinal contours are within normal limits. No significant right pleural fluid after thoracentesis. No pneumothorax. The visualized skeletal structures are unremarkable. IMPRESSION: No pneumothorax after right thoracentesis. Electronically Signed   By: Aletta Edouard M.D.   On: 04/07/2021 13:12   US Thoracentesis Asp Pleural space w/IMG guide  Result Date: 04/07/2021 INDICATION: Patient with history of lung cancer, recurrent pleural effusion. Request is made for diagnostic and therapeutic right thoracentesis. EXAM: ULTRASOUND GUIDED DIAGNOSTIC RIGHT THORACENTESIS MEDICATIONS: 5 mL 1% lidocaine COMPLICATIONS: None immediate. PROCEDURE: An ultrasound guided thoracentesis was thoroughly discussed with the patient and questions answered. The benefits, risks, alternatives and complications were also discussed. The patient understands and wishes to proceed with the procedure. Written consent was obtained. Ultrasound was performed to localize and mark an adequate pocket of fluid in the right chest. The area was then prepped and draped in the normal sterile fashion. 1% Lidocaine was used for local anesthesia. Under ultrasound  guidance a 19 gauge, 7-cm, Yueh catheter was introduced. Thoracentesis was performed. The catheter was removed and a dressing applied. FINDINGS: A total of approximately 200 mL of clear, yellow fluid was removed. Samples were sent to the laboratory as requested by the clinical team. IMPRESSION: Successful ultrasound guided diagnostic right thoracentesis yielding 200 mL of pleural fluid. Read by: Pasty Spillers PA-C, Brynda Greathouse PA-C Electronically Signed   By: Aletta Edouard M.D.   On: 04/07/2021 14:46    ASSESSMENT AND PLAN:  This is a very pleasant 65 years old white female with metastatic non-small cell lung cancer initially diagnosed as stage IIIB non-small cell lung cancer, adenocarcinoma with negative EGFR, ALK  mutations diagnosed in October 2016.  Molecular studies showed positive ROS 1 She is status post course of concurrent chemoradiation followed by consolidation chemotherapy with carboplatin and Alimta. The patient has been in observation for close to 3 years. She underwent palliative radiotherapy to the left supraclavicular lymph nodes with improvement of her disease. She also underwent SBRT to left upper lobe lung nodule as well as left supraclavicular lymphadenopathy under the care of Dr. Tammi Klippel completed in October 2021. She was found on recent CT scan of the chest to have evidence for disease recurrence in addition to malignant right pleural effusion. The patient started treatment with Entrectinib 600 mg p.o. daily and she has a lot of trouble with the treatment including fatigue, paresthesia, feeling over the most of the time and imbalance of her gait. I recommended for her to reduce her dose of Entrectinib to 400 mg p.o. daily.  Starting today. For the gait imbalance and paresthesia, I will order MRI of the brain to rule out any brain metastasis. I will see the patient back for follow-up visit in 2 weeks for evaluation and repeat blood work. She was advised to call sooner if she  has any concerning symptoms in the interval.  The patient voices understanding of current disease status and treatment options and is in agreement with the current care plan. All questions were answered. The patient knows to call the clinic with any problems, questions or concerns. We can certainly see the patient much sooner if necessary.  Disclaimer: This note was dictated with voice recognition software. Similar sounding words can inadvertently be transcribed and may not be corrected upon review.

## 2021-05-07 ENCOUNTER — Ambulatory Visit
Admission: RE | Admit: 2021-05-07 | Discharge: 2021-05-07 | Disposition: A | Discharge status: Home / Self Care | Payer: Medicare HMO | Source: Ambulatory Visit | Attending: Family Medicine | Admitting: Family Medicine

## 2021-05-07 DIAGNOSIS — Z1231 Encounter for screening mammogram for malignant neoplasm of breast: Secondary | ICD-10-CM

## 2021-05-10 ENCOUNTER — Telehealth: Payer: Self-pay | Admitting: Medical Oncology

## 2021-05-10 NOTE — Telephone Encounter (Signed)
Hives have increased . Should she take OTC benadryl vs prescription med/   Less dizzy more alert on reduced dose of Rozlytrek .   F/U appt -Scheduled message sent to schedule her with [facility] a few days after MRI.  She is not available 10/25- 10/30.

## 2021-05-10 NOTE — Progress Notes (Signed)
[facility] OFFICE PROGRESS NOTE  [place], [place] [address]  DIAGNOSIS: Stage IIIB (T2a, N3, M0) non-small cell lung cancer, adenocarcinoma with negative EGFR mutation and negative ALK gene translocation diagnosed in October 2016.   Biomarker Findings Microsatellite status - MS-Stable Tumor Mutational Burden - TMB-Low (5 Muts/Mb) Genomic Findings For a complete list of the genes assayed, please refer to the Appendix. ROS1 SDC4-ROS1 fusion RBM10 [id number]*14 TP53 R273C 7 Disease relevant genes with no reportable alterations: EGFR, KRAS, ALK, BRAF, MET, RET, ERBB2  PRIOR THERAPY: 1) Concurrent chemoradiation with weekly carboplatin for AUC of 2 and paclitaxel 45 MG/M2 status post 6 cycles. Last dose was given 07/13/2015 with partial response. 2) Systemic chemotherapy with carboplatin for AUC of 5 and Alimta 500 MG/M2 every 3 weeks. First dose 11/02/2015. Status post 6 cycles. 3) palliative stereotactic radiotherapy to the left supraclavicular lymph node under the care of Dr. MTammi Klippel  Loss of fraction of radiotherapy scheduled for November 01, 2017 4) palliative radiotherapy to the left supraclavicular lymph node as well as left upper lobe lung mass completed May 04, 2020 under the care of Dr. MTammi Klippel  CURRENT THERAPY: Entrectinib 600 mg p.o. daily.  Started April 23, 2021.  Dose reduced to 400 mg p.o. daily on 05/06/2021 due to intolerance   INTERVAL HISTORY: Rose COLQUHOUN65y.o. female returns to the clinic today for a follow-up visit. The patient was recently found to have evidence of disease progression so she started targeted treatment with entrectinib 600 mg p.o. daily on 04/23/2021. The patient had some intolerance to this with increased fatigue, generalized weakness, and numbness all over the body.  She also noted some imbalance with walking.  Dr. MJulien Nordmannreduce the dose of her entrectinib to 400 mg per metered squared on 05/06/2021  in which the patient is tolerating this better but she is still having improved but persistent symptoms.  The patient also had a brain MRI performed to rule out any metastatic disease to the brain, which was fortunately, negative. She also feels like her hands are cold despite them being warm to the touch.   Otherwise the patient denies any recent fever, chills, night sweats, or unexplained weight loss.  She is actually gaining weight secondary to the entrectinib. She denies any chest pain, cough, or hemoptysis.  Her shortness of breath is stable/improved. She does not feel like her pleural effusion re-accumulated.  She sometimes has wheezing but does not use her inhaler because it makes her jittery. She denies any nausea, vomiting, or diarrhea.  She has baseline constipation. She drinks plenty of water and eats a high fiber diet. She uses metamucil. She denies any significant headache or visual changes.  She still feels unstable but denies falls. She does not use any assistive devices. She continues to have some hives which are stable/improved. She is using golds lotion. She takes zyrtec at night.  She recently had a brain MRI performed.  She is here today for evaluation and repeat blood work.    MEDICAL HISTORY: Past Medical History:  Diagnosis Date   Age related osteoporosis 12/18/2020   Anxiety    Atherosclerosis 12/18/2020   Back pain    right back lateral   BCC (basal cell carcinoma of skin)    [facility] [place] 10/04/2017   [place] [place]   Cardiac murmur 04/24/2020   Chest discomfort 04/24/2020   Chronic pain 12/18/2020  Colon polyps    Complication of anesthesia    pt. states that she is difficult to arouse   Coronary atherosclerosis 04/24/2020   Diverticulosis    Dysrhythmia    "skip beat in early 20's"   Encounter for antineoplastic chemotherapy 06/08/2015   Generalized rash 06/24/2015   H/O cold sores    History of anemia    Lung cancer  (HCC)    RUL mass -being tx- radiation and chemo- remains last chemo 01-04-16   Lung field abnormal 12/18/2020   Metastasis to supraclavicular lymph node ([facility]) 10/04/2017   Metastatic non-small cell lung cancer ([place]) 03/24/2020   Mixed dyslipidemia 04/24/2020   Oral lesion 06/24/2015   Osteoporosis    Personal history of colonic polyps 12/18/2020   PONV (postoperative nausea and vomiting)    Port catheter in place 11/20/2015   Rectal bleeding    right upper lobe lung mass 04/15/2015   malignant tumor found - 10'16-radiation and chemotherapy and remains with chemo at present- Dr. Earlie Server follows.   Sciatica 12/18/2020   Vitamin D deficiency 12/18/2020    ALLERGIES:  has No Known Allergies.  MEDICATIONS:  Current Outpatient Medications  Medication Sig Dispense Refill   albuterol (VENTOLIN HFA) 108 (90 Base) MCG/ACT inhaler Inhale 2 puffs into the lungs as needed for wheezing or shortness of breath.     atorvastatin (LIPITOR) 10 MG tablet TAKE 1 TABLET BY MOUTH EVERY DAY 90 tablet 1   Biotin 10000 MCG TABS Take 10,000 mcg by mouth daily in the afternoon.     Calcium Carb-Cholecalciferol (CALCIUM 1000 + D PO) Take 3 tablets by mouth 2 (two) times daily. D3 = " 25 mcg"     cetirizine (ZYRTEC) 10 MG tablet Take 10 mg by mouth daily.     denosumab (PROLIA) 60 MG/ML SOSY injection Inject 60 mg into the skin every 6 (six) months.     entrectinib (ROZLYTREK) 200 MG capsule Take 3 capsules (600 mg total) by mouth daily. Swallow capsules whole. Do not open, crush, chew, or dissolve the contents. 90 capsule 3   L-LYSINE PO Take 1,000 mg by mouth daily.      Melatonin 3 MG CAPS Take 1 capsule by mouth at bedtime.     Multiple Vitamins-Minerals (PRESERVISION/LUTEIN PO) Take 1 capsule by mouth daily.     niacin 500 MG tablet Take 1 tablet by mouth daily.     nitroGLYCERIN (NITROSTAT) 0.4 MG SL tablet Place 0.4 mg under the tongue every 5 (five) minutes as needed for chest pain.     Omega-3 Fatty Acids (RA  FISH OIL) 1000 MG CPDR Take 1 capsule by mouth daily.      PREBIOTIC PRODUCT PO Take 1 tablet by mouth daily.     Probiotic Product (PROBIOTIC DAILY PO) Take 1 capsule by mouth daily.     Psyllium (REGULOID PO) Take 750 mg by mouth daily.     Turmeric Curcumin 500 MG CAPS Take 1 each by mouth daily.      vitamin B-12 (CYANOCOBALAMIN) 1000 MCG tablet Take 1,000 mcg by mouth daily.     No current facility-administered medications for this visit.    SURGICAL HISTORY:  Past Surgical History:  Procedure Laterality Date   BREAST EXCISIONAL BIOPSY Right    BREAST EXCISIONAL BIOPSY Right    BREAST EXCISIONAL BIOPSY Right    COLONOSCOPY     COLONOSCOPY W/ POLYPECTOMY     COLONOSCOPY WITH PROPOFOL N/A 01/20/2016   Procedure: COLONOSCOPY WITH PROPOFOL;  Surgeon: Arta Silence, MD;  Location: [facility];  Service: Endoscopy;  Laterality: N/A;   CYSTECTOMY     right breast x 2   IR REMOVAL TUN ACCESS W/ PORT W/O FL MOD SED  12/12/2018   MEDIASTINOSCOPY N/A 05/07/2015   Procedure: MEDIASTINOSCOPY;  Surgeon: Ivin Poot, MD;  Location: [facility];  Service: Thoracic;  Laterality: N/A;   TONSILLECTOMY     VIDEO BRONCHOSCOPY WITH ENDOBRONCHIAL ULTRASOUND N/A 05/07/2015   Procedure: VIDEO BRONCHOSCOPY WITH ENDOBRONCHIAL ULTRASOUND;  Surgeon: Ivin Poot, MD;  Location: [facility];  Service: Thoracic;  Laterality: N/A;    REVIEW OF SYSTEMS:   Review of Systems  Constitutional: Positive for fatigue. Negative for appetite change, chills,  fever and unexpected weight change.  HENT: Negative for mouth sores, nosebleeds, sore throat and trouble swallowing.   Eyes: Negative for eye problems and icterus.  Respiratory: Positive stable/improved dyspnea on exertion. Negative for cough, hemoptysis, and wheezing.   Cardiovascular: Negative for chest pain and leg swelling.  Gastrointestinal: Positive for constipation. Negative for abdominal pain,  diarrhea, nausea and vomiting.  Genitourinary: Negative for  bladder incontinence, difficulty urinating, dysuria, frequency and hematuria.   Musculoskeletal: Negative for back pain, gait problem, neck pain and neck stiffness.  Skin: Negative for itching and rash.  Neurological: Positive for numbness around the mouth and hands. Positive for dizziness. Negative for extremity weakness, gait problem, headaches, light-headedness and seizures.  Hematological: Negative for adenopathy. Does not bruise/bleed easily.  Psychiatric/Behavioral: Negative for confusion, depression and sleep disturbance. The patient is not nervous/anxious.     PHYSICAL EXAMINATION:  Blood pressure (!) 133/96, pulse 94, temperature 97.7 F (36.5 C), temperature source Tympanic, resp. rate 17, weight 161 lb 6 oz (73.2 kg), SpO2 94 %.  ECOG PERFORMANCE STATUS: 1  Physical Exam  Constitutional: Oriented to person, place, and time and well-developed, well-nourished, and in no distress.  HENT:  Head: Normocephalic and atraumatic.  Mouth/Throat: Oropharynx is clear and moist. No oropharyngeal exudate.  Eyes: Conjunctivae are normal. Right eye exhibits no discharge. Left eye exhibits no discharge. No scleral icterus.  Neck: Normal range of motion. Neck supple.  Cardiovascular: Normal rate, regular rhythm, normal heart sounds and intact distal pulses.   Pulmonary/Chest: Effort normal and breath sounds normal. No respiratory distress. No wheezes. No rales.  Abdominal: Soft. Bowel sounds are normal. Exhibits no distension and no mass. There is no tenderness.  Musculoskeletal: Normal range of motion. Exhibits no edema.  Lymphadenopathy:    No cervical adenopathy.  Neurological: Alert and oriented to person, place, and time. Exhibits normal muscle tone. Gait normal. Coordination normal.  Skin: Skin is warm and dry. No rash noted. Not diaphoretic. No erythema. No pallor.  Psychiatric: Mood, memory and judgment normal.  Vitals reviewed.  LABORATORY DATA: Lab Results  Component Value Date    WBC 4.2 05/17/2021   HGB 12.4 05/17/2021   HCT 35.7 (L) 05/17/2021   MCV 89.0 05/17/2021   PLT 292 05/17/2021      Chemistry      Component Value Date/Time   NA 138 05/06/2021 1117   NA 139 03/20/2017 1038   K 4.0 05/06/2021 1117   K 4.3 03/20/2017 1038   CL 100 05/06/2021 1117   CO2 34 (H) 05/06/2021 1117   CO2 27 03/20/2017 1038   BUN 21 05/06/2021 1117   BUN 15.4 03/20/2017 1038   CREATININE 0.98 05/06/2021 1117   CREATININE 0.7 03/20/2017 1038      Component Value Date/Time  CALCIUM 9.0 05/06/2021 1117   CALCIUM 9.8 03/20/2017 1038   ALKPHOS 38 05/06/2021 1117   ALKPHOS 37 (L) 03/20/2017 1038   AST 39 05/06/2021 1117   AST 25 03/20/2017 1038   ALT 32 05/06/2021 1117   ALT 22 03/20/2017 1038   BILITOT 0.6 05/06/2021 1117   BILITOT 0.42 03/20/2017 1038       RADIOGRAPHIC STUDIES:  MR BRAIN W WO CONTRAST  Result Date: 05/17/2021 CLINICAL DATA:  Non-small cell lung cancer, staging EXAM: MRI HEAD WITHOUT AND WITH CONTRAST TECHNIQUE: Multiplanar, multiecho pulse sequences of the brain and surrounding structures were obtained without and with intravenous contrast. CONTRAST:  13m GADAVIST GADOBUTROL 1 MMOL/ML IV SOLN COMPARISON:  09/22/2017 FINDINGS: Brain: No acute infarction, hemorrhage, hydrocephalus, extra-axial collection or mass lesion. No abnormal enhancement. No dural thickening. Scattered T2 hyperintense signal in the periventricular white matter, likely the sequela of mild chronic small vessel ischemic disease. Vascular: Normal flow voids. Skull and upper cervical spine: Normal marrow signal. Sinuses/Orbits: Negative. Other: The mastoids are well aerated. IMPRESSION: No acute intracranial process. No evidence of metastatic disease in the brain. Electronically Signed   By: AMerilyn BabaM.D.   On: 05/17/2021 03:13   MM 3D SCREEN BREAST BILATERAL  Result Date: 05/13/2021 CLINICAL DATA:  Screening. EXAM: DIGITAL SCREENING BILATERAL MAMMOGRAM WITH TOMOSYNTHESIS  AND CAD TECHNIQUE: Bilateral screening digital craniocaudal and mediolateral oblique mammograms were obtained. Bilateral screening digital breast tomosynthesis was performed. The images were evaluated with computer-aided detection. COMPARISON:  Previous exam(s). ACR Breast Density Category b: There are scattered areas of fibroglandular density. FINDINGS: In the right breast, calcifications warrant further evaluation with magnified views. In the left breast, no findings suspicious for malignancy. IMPRESSION: Further evaluation is suggested for calcifications in the right breast. RECOMMENDATION: Diagnostic mammogram of the right breast. (Code:[id number]) The patient will be contacted regarding the findings, and additional imaging will be scheduled. BI-RADS CATEGORY  0: Incomplete. Need additional imaging evaluation and/or prior mammograms for comparison. Electronically Signed   By: JMargarette CanadaM.D.   On: 05/13/2021 15:47     ASSESSMENT/PLAN:  This is a very pleasant 66year old Caucasian female with metastatic non-small cell lung cancer, adenocarcinoma.  She was initially diagnosed as stage IIIb in October 2016.  Her molecular studies show a positive ROS1 mutation.  The patient initially underwent treatment with a course of concurrent chemoradiation with carboplatin for an AUC of 2 and paclitaxel 45 mg per metered square.  This was followed by consolidation chemotherapy with carboplatin and Alimta.   The patient had been on observation for close to 3 years.  She then developed left supraclavicular adenopathy for which she completed palliative radiotherapy.  She also underwent SBRT to the left upper lobe lung nodule as well as left supraclavicular LAD lymphadenopathy under the care of Dr. MTammi Klippelin October 2021.  The patient was recently found to have evidence of disease recurrence in addition to a malignant right pleural effusion.  Dr. MJulien Nordmanntherefore started the patient on entrectinib 600 mg p.o. daily.   Her first dose was on 04/23/2021.  Her dose was reduced to 400 mg milligrams daily on 05/06/2021   Patient was seen with Dr. MJulien Nordmanntoday.  Labs are reviewed.  Dr. MJulien Nordmannrecommend she continue on the same treatment at the same dose.   We will see her back for follow-up visit in 4 weeks for evaluation and repeat blood work.  Her brain MRI was negative for metastatic disease to the brain.   No  symptoms of recurrent pleural effusion and exam unremarkable. She will call us if she feels like this needs to be evaluated sooner.   She was advised to use colace if needed for constipation.   We will arrange for restaging CT scan prior to her next appointment. Per Dr. Julien Nordmann we will arrange this to be of the chest, abdomen, and pelvis.   For the hives, she will continue with the lotion and anti-histamine. If this worsens, advised to use hydrocortisone cream. If significant hives, we would need to consider systemic steroids.   The patient was advised to call immediately if she has any concerning symptoms in the interval. The patient voices understanding of current disease status and treatment options and is in agreement with the current care plan. All questions were answered. The patient knows to call the clinic with any problems, questions or concerns. We can certainly see the patient much sooner if necessary      Orders Placed This Encounter  Procedures   CT Chest W Contrast    Standing Status:   Future    Standing Expiration Date:   05/17/2022    Order Specific Question:   If indicated for the ordered procedure, I authorize the administration of contrast media per Radiology protocol    Answer:   Yes    Order Specific Question:   Preferred imaging location?    Answer:   [facility]   CT Abdomen Pelvis W Contrast    Standing Status:   Future    Standing Expiration Date:   05/17/2022    Order Specific Question:   If indicated for the ordered procedure, I authorize the  administration of contrast media per Radiology protocol    Answer:   Yes    Order Specific Question:   Preferred imaging location?    Answer:   [facility]    Order Specific Question:   Is Oral Contrast requested for this exam?    Answer:   Yes, Per Radiology protocol   CBC with Differential ([facility] Only)    Standing Status:   Future    Standing Expiration Date:   05/17/2022   CMP ([facility] only)    Standing Status:   Future    Standing Expiration Date:   05/17/2022      Tobe Sos Lois Ostrom, PA-C 05/17/21  ADDENDUM: Hematology/Oncology Attending: I had a face-to-face encounter with the patient today.  I reviewed her record, lab as well as MRI results and recommended her care plan.  This is a very pleasant 65 years old white female with metastatic non-small cell lung cancer, adenocarcinoma that was initially diagnosed as a stage IIIb in October 2016 with molecular study positive for ROS 1 mutation.  The patient was treated initially with a course of concurrent chemoradiation with weekly carboplatin and paclitaxel followed by consolidation treatment with carboplatin and Alimta at that time.  She was in observation for 3 years before developing disease progression and left supraclavicular lymphadenopathy treated with curative radiotherapy. Recent imaging studies showed evidence for metastatic disease with malignant right pleural effusion as well as pleural-based nodularity.  The patient is started treatment with Entrectinib initially at 600 mg p.o. daily but this was reduced to 400 mg p.o. daily few days after starting the treatment secondary to intolerance. She is tolerating the lower dose of her treatment much better but she continues to complain of generalized fatigue and weakness especially in the lower extremities.  She had dizzy spells and MRI  of the brain performed recently showed no concerning findings for metastatic disease in the brain. I recommended for the  patient to continue her current treatment with Entrectinib with the same dose. We will arrange for the patient to have repeat CT scan of the chest, abdomen and pelvis in 1 months for restaging of her disease. The patient was advised to call immediately if she has any other concerning symptoms in the interval.  The total time spent in the appointment was 30 minutes. Disclaimer: This note was dictated with voice recognition software. Similar sounding words can inadvertently be transcribed and may be missed upon review. Eilleen Kempf, MD 05/17/21

## 2021-05-11 ENCOUNTER — Encounter: Payer: Self-pay | Admitting: Internal Medicine

## 2021-05-14 ENCOUNTER — Other Ambulatory Visit: Payer: Self-pay | Admitting: Family Medicine

## 2021-05-14 DIAGNOSIS — R928 Other abnormal and inconclusive findings on diagnostic imaging of breast: Secondary | ICD-10-CM

## 2021-05-15 ENCOUNTER — Ambulatory Visit (HOSPITAL_COMMUNITY)
Admission: RE | Admit: 2021-05-15 | Discharge: 2021-05-15 | Disposition: A | Discharge status: Home / Self Care | Payer: Medicare HMO | Source: Ambulatory Visit | Attending: Internal Medicine | Admitting: Internal Medicine

## 2021-05-15 DIAGNOSIS — C3490 Malignant neoplasm of unspecified part of unspecified bronchus or lung: Secondary | ICD-10-CM | POA: Diagnosis not present

## 2021-05-15 MED ORDER — GADOBUTROL 1 MMOL/ML IV SOLN
7.0000 mL | Freq: Once | INTRAVENOUS | Status: AC | PRN
  Administered 2021-05-15: 7 mL via INTRAVENOUS

## 2021-05-17 ENCOUNTER — Encounter: Payer: Self-pay | Admitting: *Deleted

## 2021-05-17 ENCOUNTER — Inpatient Hospital Stay: Payer: Medicare HMO | Admitting: Physician Assistant

## 2021-05-17 ENCOUNTER — Other Ambulatory Visit: Payer: Self-pay

## 2021-05-17 ENCOUNTER — Inpatient Hospital Stay: Payer: Medicare HMO

## 2021-05-17 VITALS — BP 133/96 | HR 94 | Temp 97.7°F | Resp 17 | Wt 161.4 lb

## 2021-05-17 DIAGNOSIS — Z79899 Other long term (current) drug therapy: Secondary | ICD-10-CM | POA: Diagnosis not present

## 2021-05-17 DIAGNOSIS — C3411 Malignant neoplasm of upper lobe, right bronchus or lung: Secondary | ICD-10-CM

## 2021-05-17 DIAGNOSIS — C3490 Malignant neoplasm of unspecified part of unspecified bronchus or lung: Secondary | ICD-10-CM | POA: Diagnosis not present

## 2021-05-17 DIAGNOSIS — J910 Malignant pleural effusion: Secondary | ICD-10-CM | POA: Diagnosis not present

## 2021-05-17 DIAGNOSIS — Z923 Personal history of irradiation: Secondary | ICD-10-CM | POA: Diagnosis not present

## 2021-05-17 DIAGNOSIS — C3412 Malignant neoplasm of upper lobe, left bronchus or lung: Secondary | ICD-10-CM | POA: Diagnosis not present

## 2021-05-17 LAB — CBC WITH DIFFERENTIAL (CANCER CENTER ONLY)
Abs Immature Granulocytes: 0.02 10*3/uL (ref 0.00–0.07)
Basophils Absolute: 0 10*3/uL (ref 0.0–0.1)
Basophils Relative: 1 %
Eosinophils Absolute: 0.2 10*3/uL (ref 0.0–0.5)
Eosinophils Relative: 5 %
HCT: 35.7 % — ABNORMAL LOW (ref 36.0–46.0)
Hemoglobin: 12.4 g/dL (ref 12.0–15.0)
Immature Granulocytes: 1 %
Lymphocytes Relative: 28 %
Lymphs Abs: 1.2 10*3/uL (ref 0.7–4.0)
MCH: 30.9 pg (ref 26.0–34.0)
MCHC: 34.7 g/dL (ref 30.0–36.0)
MCV: 89 fL (ref 80.0–100.0)
Monocytes Absolute: 0.6 10*3/uL (ref 0.1–1.0)
Monocytes Relative: 14 %
Neutro Abs: 2.2 10*3/uL (ref 1.7–7.7)
Neutrophils Relative %: 51 %
Platelet Count: 292 10*3/uL (ref 150–400)
RBC: 4.01 MIL/uL (ref 3.87–5.11)
RDW: 14.8 % (ref 11.5–15.5)
WBC Count: 4.2 10*3/uL (ref 4.0–10.5)
nRBC: 0 % (ref 0.0–0.2)

## 2021-05-17 LAB — CMP (CANCER CENTER ONLY)
ALT: 44 U/L (ref 0–44)
AST: 49 U/L — ABNORMAL HIGH (ref 15–41)
Albumin: 4.4 g/dL (ref 3.5–5.0)
Alkaline Phosphatase: 41 U/L (ref 38–126)
Anion gap: 9 (ref 5–15)
BUN: 19 mg/dL (ref 8–23)
CO2: 28 mmol/L (ref 22–32)
Calcium: 9.6 mg/dL (ref 8.9–10.3)
Chloride: 102 mmol/L (ref 98–111)
Creatinine: 0.82 mg/dL (ref 0.44–1.00)
GFR, Estimated: >60 mL/min (ref >60)
Glucose, Bld: 83 mg/dL (ref 70–99)
Potassium: 4.1 mmol/L (ref 3.5–5.1)
Sodium: 139 mmol/L (ref 135–145)
Total Bilirubin: 0.6 mg/dL (ref 0.3–1.2)
Total Protein: 6.8 g/dL (ref 6.5–8.1)

## 2021-05-17 NOTE — Progress Notes (Signed)
I spoke with Rose Figueroa today.  She is on a different treatment plan and I was checking on her. She was tearful at the visit.  She did verbalize understanding of her treatment plan of care.  I offered support and encouragement and asked that she reach out to me if needed.

## 2021-05-28 ENCOUNTER — Other Ambulatory Visit: Payer: Self-pay | Admitting: Family Medicine

## 2021-05-29 ENCOUNTER — Other Ambulatory Visit: Payer: Self-pay | Admitting: Family Medicine

## 2021-05-29 ENCOUNTER — Ambulatory Visit
Admission: RE | Admit: 2021-05-29 | Discharge: 2021-05-29 | Disposition: A | Discharge status: Home / Self Care | Payer: Medicare HMO | Source: Ambulatory Visit | Attending: Family Medicine | Admitting: Family Medicine

## 2021-05-29 DIAGNOSIS — R928 Other abnormal and inconclusive findings on diagnostic imaging of breast: Secondary | ICD-10-CM

## 2021-05-29 DIAGNOSIS — R922 Inconclusive mammogram: Secondary | ICD-10-CM | POA: Diagnosis not present

## 2021-05-29 DIAGNOSIS — R921 Mammographic calcification found on diagnostic imaging of breast: Secondary | ICD-10-CM | POA: Diagnosis not present

## 2021-06-03 DIAGNOSIS — Z86010 Personal history of colonic polyps: Secondary | ICD-10-CM | POA: Diagnosis not present

## 2021-06-03 DIAGNOSIS — C3490 Malignant neoplasm of unspecified part of unspecified bronchus or lung: Secondary | ICD-10-CM | POA: Diagnosis not present

## 2021-06-11 ENCOUNTER — Other Ambulatory Visit: Payer: Self-pay

## 2021-06-11 ENCOUNTER — Ambulatory Visit (HOSPITAL_COMMUNITY)
Admission: RE | Admit: 2021-06-11 | Discharge: 2021-06-11 | Disposition: A | Discharge status: Home / Self Care | Payer: Medicare HMO | Source: Ambulatory Visit | Attending: Physician Assistant | Admitting: Physician Assistant

## 2021-06-11 ENCOUNTER — Inpatient Hospital Stay: Payer: Medicare HMO | Attending: Internal Medicine

## 2021-06-11 ENCOUNTER — Encounter (HOSPITAL_COMMUNITY): Payer: Self-pay

## 2021-06-11 DIAGNOSIS — C3490 Malignant neoplasm of unspecified part of unspecified bronchus or lung: Secondary | ICD-10-CM | POA: Insufficient documentation

## 2021-06-11 DIAGNOSIS — C3412 Malignant neoplasm of upper lobe, left bronchus or lung: Secondary | ICD-10-CM | POA: Insufficient documentation

## 2021-06-11 DIAGNOSIS — Z79899 Other long term (current) drug therapy: Secondary | ICD-10-CM | POA: Insufficient documentation

## 2021-06-11 DIAGNOSIS — N2889 Other specified disorders of kidney and ureter: Secondary | ICD-10-CM | POA: Diagnosis not present

## 2021-06-11 DIAGNOSIS — R911 Solitary pulmonary nodule: Secondary | ICD-10-CM | POA: Diagnosis not present

## 2021-06-11 DIAGNOSIS — J90 Pleural effusion, not elsewhere classified: Secondary | ICD-10-CM | POA: Diagnosis not present

## 2021-06-11 DIAGNOSIS — J910 Malignant pleural effusion: Secondary | ICD-10-CM | POA: Insufficient documentation

## 2021-06-11 DIAGNOSIS — I700 Atherosclerosis of aorta: Secondary | ICD-10-CM | POA: Diagnosis not present

## 2021-06-11 LAB — CMP (CANCER CENTER ONLY)
ALT: 56 U/L — ABNORMAL HIGH (ref 0–44)
AST: 49 U/L — ABNORMAL HIGH (ref 15–41)
Albumin: 3.9 g/dL (ref 3.5–5.0)
Alkaline Phosphatase: 55 U/L (ref 38–126)
Anion gap: 9 (ref 5–15)
BUN: 19 mg/dL (ref 8–23)
CO2: 29 mmol/L (ref 22–32)
Calcium: 9.3 mg/dL (ref 8.9–10.3)
Chloride: 103 mmol/L (ref 98–111)
Creatinine: 0.94 mg/dL (ref 0.44–1.00)
GFR, Estimated: >60 mL/min (ref >60)
Glucose, Bld: 79 mg/dL (ref 70–99)
Potassium: 4.3 mmol/L (ref 3.5–5.1)
Sodium: 141 mmol/L (ref 135–145)
Total Bilirubin: 0.5 mg/dL (ref 0.3–1.2)
Total Protein: 6.5 g/dL (ref 6.5–8.1)

## 2021-06-11 LAB — CBC WITH DIFFERENTIAL (CANCER CENTER ONLY)
Abs Immature Granulocytes: 0 10*3/uL (ref 0.00–0.07)
Basophils Absolute: 0 10*3/uL (ref 0.0–0.1)
Basophils Relative: 1 %
Eosinophils Absolute: 0.2 10*3/uL (ref 0.0–0.5)
Eosinophils Relative: 6 %
HCT: 36.4 % (ref 36.0–46.0)
Hemoglobin: 12.3 g/dL (ref 12.0–15.0)
Immature Granulocytes: 0 %
Lymphocytes Relative: 33 %
Lymphs Abs: 1 10*3/uL (ref 0.7–4.0)
MCH: 30.8 pg (ref 26.0–34.0)
MCHC: 33.8 g/dL (ref 30.0–36.0)
MCV: 91.2 fL (ref 80.0–100.0)
Monocytes Absolute: 0.5 10*3/uL (ref 0.1–1.0)
Monocytes Relative: 18 %
Neutro Abs: 1.2 10*3/uL — ABNORMAL LOW (ref 1.7–7.7)
Neutrophils Relative %: 42 %
Platelet Count: 300 10*3/uL (ref 150–400)
RBC: 3.99 MIL/uL (ref 3.87–5.11)
RDW: 15.9 % — ABNORMAL HIGH (ref 11.5–15.5)
WBC Count: 2.9 10*3/uL — ABNORMAL LOW (ref 4.0–10.5)
nRBC: 0 % (ref 0.0–0.2)

## 2021-06-11 MED ORDER — SODIUM CHLORIDE (PF) 0.9 % IJ SOLN
INTRAMUSCULAR | Status: AC
  Filled 2021-06-11: qty 50

## 2021-06-11 MED ORDER — IOHEXOL 350 MG/ML SOLN
75.0000 mL | Freq: Once | INTRAVENOUS | Status: AC | PRN
  Administered 2021-06-11: 75 mL via INTRAVENOUS

## 2021-06-14 ENCOUNTER — Encounter: Payer: Self-pay | Admitting: *Deleted

## 2021-06-14 ENCOUNTER — Inpatient Hospital Stay: Payer: Medicare HMO | Admitting: Internal Medicine

## 2021-06-14 ENCOUNTER — Encounter: Payer: Self-pay | Admitting: Medical Oncology

## 2021-06-14 ENCOUNTER — Encounter: Payer: Self-pay | Admitting: Internal Medicine

## 2021-06-14 ENCOUNTER — Inpatient Hospital Stay: Payer: Medicare HMO | Admitting: *Deleted

## 2021-06-14 ENCOUNTER — Other Ambulatory Visit: Payer: Self-pay

## 2021-06-14 VITALS — BP 127/94 | HR 83 | Temp 97.1°F | Resp 20 | Wt 163.1 lb

## 2021-06-14 DIAGNOSIS — C3411 Malignant neoplasm of upper lobe, right bronchus or lung: Secondary | ICD-10-CM

## 2021-06-14 DIAGNOSIS — C3490 Malignant neoplasm of unspecified part of unspecified bronchus or lung: Secondary | ICD-10-CM

## 2021-06-14 DIAGNOSIS — Z79899 Other long term (current) drug therapy: Secondary | ICD-10-CM | POA: Diagnosis not present

## 2021-06-14 DIAGNOSIS — Z5111 Encounter for antineoplastic chemotherapy: Secondary | ICD-10-CM

## 2021-06-14 DIAGNOSIS — C3412 Malignant neoplasm of upper lobe, left bronchus or lung: Secondary | ICD-10-CM | POA: Diagnosis not present

## 2021-06-14 DIAGNOSIS — J910 Malignant pleural effusion: Secondary | ICD-10-CM | POA: Diagnosis not present

## 2021-06-14 NOTE — Addendum Note (Signed)
Addended by: Ardeen Garland on: 06/14/2021 02:54 PM   Modules accepted: Orders

## 2021-06-14 NOTE — Progress Notes (Signed)
[place] [phone]   Fax:([phone]) [place], [place] [address]  DIAGNOSIS: Stage IIIB (T2a, N3, M0) non-small cell lung cancer, adenocarcinoma with negative EGFR mutation and negative ALK gene translocation diagnosed in October 2016.  Biomarker Findings Microsatellite status - MS-Stable Tumor Mutational Burden - TMB-Low (5 Muts/Mb) Genomic Findings For a complete list of the genes assayed, please refer to the Appendix. ROS1 SDC4-ROS1 fusion RBM10 N532f*14 TP53 R273C 7 Disease relevant genes with no reportable alterations: EGFR, KRAS, ALK, BRAF, MET, RET, ERBB2  PRIOR THERAPY:  1) Concurrent chemoradiation with weekly carboplatin for AUC of 2 and paclitaxel 45 MG/M2 status post 6 cycles. Last dose was given 07/13/2015 with partial response. 2) Systemic chemotherapy with carboplatin for AUC of 5 and Alimta 500 MG/M2 every 3 weeks. First dose 11/02/2015. Status post 6 cycles. 3) palliative stereotactic radiotherapy to the left supraclavicular lymph node under the care of Dr. MTammi Klippel  Loss of fraction of radiotherapy scheduled for November 01, 2017 4) palliative radiotherapy to the left supraclavicular lymph node as well as left upper lobe lung mass completed May 04, 2020 under the care of Dr. MTammi Klippel  CURRENT THERAPY: Entrectinib 600 mg p.o. daily.  Started April 23, 2021.  Her dose was reduced to 400 mg p.o. daily 6 weeks ago.  Status post 2 months of treatment  INTERVAL HISTORY: Rose [id number]. female returns to the clinic today for follow-up visit.  The patient is feeling fine today with no concerning complaints except for mild fatigue.  She is tolerating her treatment with Entrectinib fairly well specially after reducing her dose.  The patient denied having any current chest pain, shortness of breath, cough or hemoptysis.  She denied having any fever or  chills.  She has no nausea, vomiting, diarrhea or constipation.  She has no headache or visual changes.  She denied having any significant weight loss or night sweats.  She had repeat CT scan of the chest, abdomen pelvis performed recently and she is here for evaluation and discussion of her scan results.  MEDICAL HISTORY: Past Medical History:  Diagnosis Date   Age related osteoporosis 12/18/2020   Anxiety    Atherosclerosis 12/18/2020   Back pain    right back lateral   BCC (basal cell carcinoma of skin)    Bone metastasis ([facility] 10/04/2017   [place] 05/19[place]2015   Cardiac murmur 04/24/2020   Chest discomfort 04/24/2020   Chronic pain 12/18/2020   Colon polyps    Complication of anesthesia    pt. states that she is difficult to arouse   Coronary atherosclerosis 04/24/2020   Diverticulosis    Dysrhythmia    "skip beat in early 20's"   Encounter for antineoplastic chemotherapy 06/08/2015   Generalized rash 06/24/2015   H/O cold sores    History of anemia    Lung cancer (HCC)    RUL mass -being tx- radiation and chemo- remains last chemo 01-04-16   Lung field abnormal 12/18/2020   [place]lavicular [place] node [place] 10/04/2017   Metastatic non-small cell lung cancer ([facility] 03/24/2020   Mixed dyslipidemia 04/24/2020   Oral lesion 06/24/2015   Osteoporosis    Personal history of colonic polyps 12/18/2020   PONV (postoperative nausea and vomiting)    Port catheter in place 11/20/2015   Rectal bleeding  right upper lobe lung mass 04/15/2015   malignant tumor found - 10'16-radiation and chemotherapy and remains with chemo at present- Dr. Earlie Server follows.   Sciatica 12/18/2020   Vitamin D deficiency 12/18/2020    ALLERGIES:  has No Known Allergies.  MEDICATIONS:  Current Outpatient Medications  Medication Sig Dispense Refill   albuterol (VENTOLIN HFA) 108 (90 Base) MCG/ACT inhaler Inhale 2 puffs into the lungs as needed for wheezing or shortness of  breath.     atorvastatin (LIPITOR) 10 MG tablet TAKE 1 TABLET BY MOUTH EVERY DAY 90 tablet 1   Biotin 10000 MCG TABS Take 10,000 mcg by mouth daily in the afternoon.     Calcium Carb-Cholecalciferol (CALCIUM 1000 + D PO) Take 3 tablets by mouth 2 (two) times daily. D3 = " 25 mcg"     cetirizine (ZYRTEC) 10 MG tablet Take 10 mg by mouth daily.     denosumab (PROLIA) 60 MG/ML SOSY injection Inject 60 mg into the skin every 6 (six) months.     entrectinib (ROZLYTREK) 200 MG capsule Take 3 capsules (600 mg total) by mouth daily. Swallow capsules whole. Do not open, crush, chew, or dissolve the contents. 90 capsule 3   L-LYSINE PO Take 1,000 mg by mouth daily.      Melatonin 3 MG CAPS Take 1 capsule by mouth at bedtime.     Multiple Vitamins-Minerals (PRESERVISION/LUTEIN PO) Take 1 capsule by mouth daily.     niacin 500 MG tablet Take 1 tablet by mouth daily.     nitroGLYCERIN (NITROSTAT) 0.4 MG SL tablet Place 0.4 mg under the tongue every 5 (five) minutes as needed for chest pain.     Omega-3 Fatty Acids (RA FISH OIL) 1000 MG CPDR Take 1 capsule by mouth daily.      PREBIOTIC PRODUCT PO Take 1 tablet by mouth daily.     Probiotic Product (PROBIOTIC DAILY PO) Take 1 capsule by mouth daily.     Psyllium (REGULOID PO) Take 750 mg by mouth daily.     Turmeric Curcumin 500 MG CAPS Take 1 each by mouth daily.      vitamin B-12 (CYANOCOBALAMIN) 1000 MCG tablet Take 1,000 mcg by mouth daily.     No current facility-administered medications for this visit.    SURGICAL HISTORY:  Past Surgical History:  Procedure Laterality Date   BREAST EXCISIONAL BIOPSY Right    BREAST EXCISIONAL BIOPSY Right    BREAST EXCISIONAL BIOPSY Right    COLONOSCOPY     COLONOSCOPY W/ POLYPECTOMY     COLONOSCOPY WITH PROPOFOL N/A 01/20/2016   Procedure: COLONOSCOPY WITH PROPOFOL;  Surgeon: Arta Silence, MD;  Location: [facility];  Service: Endoscopy;  Laterality: N/A;   CYSTECTOMY     right breast x 2   IR REMOVAL  TUN ACCESS W/ PORT W/O FL MOD SED  12/12/2018   MEDIASTINOSCOPY N/A 05/07/2015   Procedure: MEDIASTINOSCOPY;  Surgeon: Ivin Poot, MD;  Location: [facility];  Service: Thoracic;  Laterality: N/A;   TONSILLECTOMY     VIDEO BRONCHOSCOPY WITH ENDOBRONCHIAL ULTRASOUND N/A 05/07/2015   Procedure: VIDEO BRONCHOSCOPY WITH ENDOBRONCHIAL ULTRASOUND;  Surgeon: Ivin Poot, MD;  Location: [facility];  Service: Thoracic;  Laterality: N/A;    REVIEW OF SYSTEMS:  Constitutional: positive for fatigue Eyes: negative Ears, nose, mouth, throat, and face: negative Respiratory: negative Cardiovascular: negative Gastrointestinal: negative Genitourinary:negative Integument/breast: negative Hematologic/lymphatic: negative Musculoskeletal:negative Neurological: positive for paresthesia Behavioral/Psych: negative Endocrine: negative Allergic/Immunologic: negative   PHYSICAL EXAMINATION: General appearance:  alert, cooperative, fatigued and no distress Head: Normocephalic, without obvious abnormality, atraumatic Neck: no adenopathy, no JVD, supple, symmetrical, trachea midline and thyroid not enlarged, symmetric, no tenderness/mass/nodules Lymph nodes: Cervical, supraclavicular, and axillary nodes normal. Resp: clear to auscultation bilaterally Back: symmetric, no curvature. ROM normal. No CVA tenderness. Cardio: regular rate and rhythm, S1, S2 normal, no murmur, click, rub or gallop GI: soft, non-tender; bowel sounds normal; no masses,  no organomegaly Extremities: extremities normal, atraumatic, no cyanosis or edema Neurological exam unremarkable  ECOG PERFORMANCE STATUS: 1 - Symptomatic but completely ambulatory  Blood pressure (!) 127/94, pulse 83, temperature (!) 97.1 F (36.2 C), resp. rate 20, weight 163 lb 1.6 oz (74 kg), SpO2 98 %.  LABORATORY DATA: Lab Results  Component Value Date   WBC 2.9 (L) 06/11/2021   HGB 12.3 06/11/2021   HCT 36.4 06/11/2021   MCV 91.2 06/11/2021   PLT 300  06/11/2021      Chemistry      Component Value Date/Time   NA 141 06/11/2021 0934   NA 139 03/20/2017 1038   K 4.3 06/11/2021 0934   K 4.3 03/20/2017 1038   CL 103 06/11/2021 0934   CO2 29 06/11/2021 0934   CO2 27 03/20/2017 1038   BUN 19 06/11/2021 0934   BUN 15.4 03/20/2017 1038   CREATININE 0.94 06/11/2021 0934   CREATININE 0.7 03/20/2017 1038      Component Value Date/Time   CALCIUM 9.3 06/11/2021 0934   CALCIUM 9.8 03/20/2017 1038   ALKPHOS 55 06/11/2021 0934   ALKPHOS 37 (L) 03/20/2017 1038   AST 49 (H) 06/11/2021 0934   AST 25 03/20/2017 1038   ALT 56 (H) 06/11/2021 0934   ALT 22 03/20/2017 1038   BILITOT 0.5 06/11/2021 0934   BILITOT 0.42 03/20/2017 1038       RADIOGRAPHIC STUDIES: CT Chest W Contrast  Result Date: 06/11/2021 CLINICAL DATA:  65 year old female with history of stage III B non-small cell lung cancer (adenocarcinoma). Follow-up study. EXAM: CT CHEST, ABDOMEN, AND PELVIS WITH CONTRAST TECHNIQUE: Multidetector CT imaging of the chest, abdomen and pelvis was performed following the standard protocol during bolus administration of intravenous contrast. CONTRAST:  58m OMNIPAQUE IOHEXOL 350 MG/ML SOLN COMPARISON:  Chest CT 03/23/2021. PET-CT 04/01/2020. CT the chest, abdomen and pelvis 08/25/2017. Multiple other prior examinations. FINDINGS: CT CHEST FINDINGS Cardiovascular: Heart size is normal. Small amount of pericardial fluid and/or thickening, unlikely to be of any hemodynamic significance at this time. No pericardial calcification. There is aortic atherosclerosis, as well as atherosclerosis of the great vessels of the mediastinum and the coronary arteries, including calcified atherosclerotic plaque in the left anterior descending coronary artery. Mediastinum/Nodes: No pathologically enlarged mediastinal or hilar lymph nodes. Esophagus is unremarkable in appearance. No axillary lymphadenopathy. Lungs/Pleura: Previously noted pleural based nodule in the  posterior aspect of the right lower lobe (axial image 121 of series 2) is similar to the prior study, currently measuring 2.8 x 1.2 cm. Several areas of pleural thickening and nodularity are again noted in the right hemithorax, compatible with widespread pleural metastasis. Small right pleural effusion lying dependently, stable compared to the prior examination, presumably malignant. Chronic areas of postradiation mass-like fibrosis are again noted in the lungs bilaterally, most evident in the medial aspect of the right mid to upper lung, and in the left upper lobe near the apex, stable compared to the prior examination. No definite new suspicious appearing pulmonary nodules or masses are otherwise noted. No left pleural effusion. No  acute consolidative airspace disease. Musculoskeletal: Sclerotic lesions are again noted in the visualized axial and appendicular skeleton, largest of which is again in the posterior aspect of the T2 vertebral body measuring 1.6 cm in diameter. Chronic pathologic compression fracture of superior endplate of T6 with 60% loss of anterior vertebral body height, unchanged. CT ABDOMEN PELVIS FINDINGS Hepatobiliary: Subcentimeter low-attenuation lesions in segment 4A and 8 of the liver, too small to characterize, but statistically likely to represent small cysts. No other larger more suspicious appearing hepatic lesions. No intra or extrahepatic biliary ductal dilatation. Gallbladder is normal in appearance. Pancreas: No pancreatic mass. No pancreatic ductal dilatation. No pancreatic or peripancreatic fluid collections or inflammatory changes. Spleen: Unremarkable. Adrenals/Urinary Tract: Subcentimeter low-attenuation lesion in the lower pole of the left kidney, too small to characterize, but statistically likely to represent a small cyst. Right kidney and bilateral adrenal glands are normal in appearance. No hydroureteronephrosis. Urinary bladder is normal in appearance. Stomach/Bowel: The  appearance of the stomach is normal. No pathologic dilatation of small bowel or colon. Normal appendix. Vascular/Lymphatic: Aortic atherosclerosis, without evidence of aneurysm or dissection in the abdominal or pelvic vasculature. No lymphadenopathy noted in the abdomen or pelvis. Reproductive: Uterus and ovaries are unremarkable in appearance. Other: No significant volume of ascites.  No pneumoperitoneum. Musculoskeletal: There are no aggressive appearing lytic or blastic lesions noted in the visualized portions of the skeleton. IMPRESSION: 1. Findings are very similar to the recent prior study, with stable size and appearance of pleural based nodule in the right lower lobe lower lobe, as well as right-sided pleural nodularity and small presumably malignant right pleural effusion. 2. No definite signs of metastatic disease in the abdomen or pelvis. 3. Small amount of pericardial fluid and/or thickening, unlikely to be of hemodynamic significance at this time. No pericardial calcification. 4. Sclerotic lesions in the bones, likely metastatic, unchanged compared to the prior study. 5. Aortic atherosclerosis, in addition to left anterior descending coronary artery disease. Electronically Signed   By: Vinnie Langton M.D.   On: 06/11/2021 11:35   MR BRAIN W WO CONTRAST  Result Date: 05/17/2021 CLINICAL DATA:  Non-small cell lung cancer, staging EXAM: MRI HEAD WITHOUT AND WITH CONTRAST TECHNIQUE: Multiplanar, multiecho pulse sequences of the brain and surrounding structures were obtained without and with intravenous contrast. CONTRAST:  40m GADAVIST GADOBUTROL 1 MMOL/ML IV SOLN COMPARISON:  09/22/2017 FINDINGS: Brain: No acute infarction, hemorrhage, hydrocephalus, extra-axial collection or mass lesion. No abnormal enhancement. No dural thickening. Scattered T2 hyperintense signal in the periventricular white matter, likely the sequela of mild chronic small vessel ischemic disease. Vascular: Normal flow voids.  Skull and upper cervical spine: Normal marrow signal. Sinuses/Orbits: Negative. Other: The mastoids are well aerated. IMPRESSION: No acute intracranial process. No evidence of metastatic disease in the brain. Electronically Signed   By: AMerilyn BabaM.D.   On: 05/17/2021 03:13   CT Abdomen Pelvis W Contrast  Result Date: 06/11/2021 CLINICAL DATA:  65year old female with history of stage III B non-small cell lung cancer (adenocarcinoma). Follow-up study. EXAM: CT CHEST, ABDOMEN, AND PELVIS WITH CONTRAST TECHNIQUE: Multidetector CT imaging of the chest, abdomen and pelvis was performed following the standard protocol during bolus administration of intravenous contrast. CONTRAST:  756mOMNIPAQUE IOHEXOL 350 MG/ML SOLN COMPARISON:  Chest CT 03/23/2021. PET-CT 04/01/2020. CT the chest, abdomen and pelvis 08/25/2017. Multiple other prior examinations. FINDINGS: CT CHEST FINDINGS Cardiovascular: Heart size is normal. Small amount of pericardial fluid and/or thickening, unlikely to be of any hemodynamic  significance at this time. No pericardial calcification. There is aortic atherosclerosis, as well as atherosclerosis of the great vessels of the mediastinum and the coronary arteries, including calcified atherosclerotic plaque in the left anterior descending coronary artery. Mediastinum/Nodes: No pathologically enlarged mediastinal or hilar lymph nodes. Esophagus is unremarkable in appearance. No axillary lymphadenopathy. Lungs/Pleura: Previously noted pleural based nodule in the posterior aspect of the right lower lobe (axial image 121 of series 2) is similar to the prior study, currently measuring 2.8 x 1.2 cm. Several areas of pleural thickening and nodularity are again noted in the right hemithorax, compatible with widespread pleural metastasis. Small right pleural effusion lying dependently, stable compared to the prior examination, presumably malignant. Chronic areas of postradiation mass-like fibrosis are again  noted in the lungs bilaterally, most evident in the medial aspect of the right mid to upper lung, and in the left upper lobe near the apex, stable compared to the prior examination. No definite new suspicious appearing pulmonary nodules or masses are otherwise noted. No left pleural effusion. No acute consolidative airspace disease. Musculoskeletal: Sclerotic lesions are again noted in the visualized axial and appendicular skeleton, largest of which is again in the posterior aspect of the T2 vertebral body measuring 1.6 cm in diameter. Chronic pathologic compression fracture of superior endplate of T6 with 44% loss of anterior vertebral body height, unchanged. CT ABDOMEN PELVIS FINDINGS Hepatobiliary: Subcentimeter low-attenuation lesions in segment 4A and 8 of the liver, too small to characterize, but statistically likely to represent small cysts. No other larger more suspicious appearing hepatic lesions. No intra or extrahepatic biliary ductal dilatation. Gallbladder is normal in appearance. Pancreas: No pancreatic mass. No pancreatic ductal dilatation. No pancreatic or peripancreatic fluid collections or inflammatory changes. Spleen: Unremarkable. Adrenals/Urinary Tract: Subcentimeter low-attenuation lesion in the lower pole of the left kidney, too small to characterize, but statistically likely to represent a small cyst. Right kidney and bilateral adrenal glands are normal in appearance. No hydroureteronephrosis. Urinary bladder is normal in appearance. Stomach/Bowel: The appearance of the stomach is normal. No pathologic dilatation of small bowel or colon. Normal appendix. Vascular/Lymphatic: Aortic atherosclerosis, without evidence of aneurysm or dissection in the abdominal or pelvic vasculature. No lymphadenopathy noted in the abdomen or pelvis. Reproductive: Uterus and ovaries are unremarkable in appearance. Other: No significant volume of ascites.  No pneumoperitoneum. Musculoskeletal: There are no  aggressive appearing lytic or blastic lesions noted in the visualized portions of the skeleton. IMPRESSION: 1. Findings are very similar to the recent prior study, with stable size and appearance of pleural based nodule in the right lower lobe lower lobe, as well as right-sided pleural nodularity and small presumably malignant right pleural effusion. 2. No definite signs of metastatic disease in the abdomen or pelvis. 3. Small amount of pericardial fluid and/or thickening, unlikely to be of hemodynamic significance at this time. No pericardial calcification. 4. Sclerotic lesions in the bones, likely metastatic, unchanged compared to the prior study. 5. Aortic atherosclerosis, in addition to left anterior descending coronary artery disease. Electronically Signed   By: Vinnie Langton M.D.   On: 06/11/2021 11:35   MM Digital Diagnostic Unilat R  Result Date: 05/29/2021 CLINICAL DATA:  Patient returns today to evaluate RIGHT breast calcifications identified on recent screening mammogram. EXAM: DIGITAL DIAGNOSTIC UNILATERAL RIGHT MAMMOGRAM TECHNIQUE: Right digital diagnostic mammography was performed. Mammographic images were processed with CAD. COMPARISON:  Previous exam(s). ACR Breast Density Category b: There are scattered areas of fibroglandular density. FINDINGS: On today's additional diagnostic views, including  magnification views, 2 nearby groups of calcifications are confirmed. The more anterior group demonstrate coarse heterogeneous, possibly casting, morphology and measure 8 mm extent. The more posterior calcifications demonstrate punctate and fine pleomorphic morphologies and measure 3 mm extent. Overall, the extent of calcifications measures 2.2 cm. IMPRESSION: Recommend 2 site stereotactic biopsies targeting the anterior and posterior groups of calcifications in the upper RIGHT breast, as detailed above. RECOMMENDATION: Two site stereotactic biopsies for the RIGHT breast calcifications. Stereotactic  biopsies are scheduled for November 17th. I have discussed the findings and recommendations with the patient. If applicable, a reminder letter will be sent to the patient regarding the next appointment. BI-RADS CATEGORY  4: Suspicious. Electronically Signed   By: Franki Cabot M.D.   On: 05/29/2021 10:45   ASSESSMENT AND PLAN:  This is a very pleasant 65 years old white female with metastatic non-small cell lung cancer initially diagnosed as stage IIIB non-small cell lung cancer, adenocarcinoma with negative EGFR, ALK mutations diagnosed in October 2016.  Molecular studies showed positive ROS 1 She is status post course of concurrent chemoradiation followed by consolidation chemotherapy with carboplatin and Alimta. The patient has been in observation for close to 3 years. She underwent palliative radiotherapy to the left supraclavicular lymph nodes with improvement of her disease. She also underwent SBRT to left upper lobe lung nodule as well as left supraclavicular lymphadenopathy under the care of Dr. Tammi Klippel completed in October 2021. She was found on recent CT scan of the chest to have evidence for disease recurrence in addition to malignant right pleural effusion. The patient started treatment with Entrectinib 600 mg p.o. daily and she has a lot of trouble with the treatment including fatigue, paresthesia, feeling over the most of the time and imbalance of her gait.  Her dose of Entrectinib was reduced to 400 mg p.o. daily several weeks ago and she has been tolerating the lower dose much better. She had repeat CT scan of the chest, abdomen pelvis performed recently.  I personally and independently reviewed the scan images and discussed the results with the patient today. Her scan showed no concerning findings for disease progression. I recommended for the patient to continue her current treatment with Entrectinib 400 mg p.o. daily. For the suspicious breast lesion on the recent mammogram, she is  expected to have a biopsy and will follow up after the procedure for any suspicious abnormality. The patient will come back for follow-up visit in 1 months for evaluation with repeat blood work. She was advised to call immediately if she has any concerning symptoms in the interval.  The patient voices understanding of current disease status and treatment options and is in agreement with the current care plan. All questions were answered. The patient knows to call the clinic with any problems, questions or concerns. We can certainly see the patient much sooner if necessary.  Disclaimer: This note was dictated with voice recognition software. Similar sounding words can inadvertently be transcribed and may not be corrected upon review.

## 2021-06-14 NOTE — Progress Notes (Signed)
Linked encounters

## 2021-06-14 NOTE — Progress Notes (Signed)
Oncology Nurse Navigator Documentation  Oncology Nurse Navigator Flowsheets 06/14/2021 11/19/2019 07/16/2018 05/22/2018 10/06/2017 10/05/2017 09/19/2017  Navigator Location [facility]  Referral Date to RadOnc/MedOnc - - - - - - -  Navigator Encounter Type Clinic/[facility]C Clinic/[facility] Telephone Clinic/[facility] Telephone Treatment;Other Telephone  Telephone - - Outgoing Call - Outgoing Call - Outgoing Call  Patient Visit Type - MedOnc - - - - -  Treatment Phase Treatment Abnormal Scans Follow-up Follow-up Treatment Treatment Follow-up  Barriers/Navigation Needs Education/I spoke with Rose Figueroa today after she saw Dr. Julien Nordmann.  She is doing well with the reduced dose of her medication for lung cancer.  She has a lot going on and I offered her to join the lung cancer support group.  I will update [name] who runs the group to contact Rose Figueroa to join.  I asked that she call me when needed.  Education Education;Coordination of Care Education Financial;Education IT consultant  Education Other Other Other Other Other - Other  Interventions Psycho-Social Support;Education Education;Psycho-Social Support Coordination of Care;Education Education Education Other Coordination of Care;Education  Acuity Level 2-Minimal Needs (1-2 Barriers Identified) Level 2-Minimal Needs (1-2 Barriers Identified) Level 2 Level 1 Level 2 Level 2 Level 2  Coordination of Care - - Other - - - Other  Education Method Verbal Verbal Verbal Verbal;Written Verbal - Verbal  Support Groups/Services Other - - - - - -  Time Spent with Patient 30 15 45 15 30 30  30

## 2021-06-16 ENCOUNTER — Ambulatory Visit
Admission: RE | Admit: 2021-06-16 | Discharge: 2021-06-16 | Disposition: A | Discharge status: Home / Self Care | Payer: Medicare HMO | Source: Ambulatory Visit | Attending: Family Medicine | Admitting: Family Medicine

## 2021-06-16 ENCOUNTER — Encounter: Payer: Self-pay | Admitting: *Deleted

## 2021-06-16 ENCOUNTER — Encounter: Payer: Self-pay | Admitting: Internal Medicine

## 2021-06-16 ENCOUNTER — Other Ambulatory Visit: Payer: Self-pay

## 2021-06-16 DIAGNOSIS — R928 Other abnormal and inconclusive findings on diagnostic imaging of breast: Secondary | ICD-10-CM

## 2021-06-16 DIAGNOSIS — R921 Mammographic calcification found on diagnostic imaging of breast: Secondary | ICD-10-CM | POA: Diagnosis not present

## 2021-06-16 DIAGNOSIS — D0511 Intraductal carcinoma in situ of right breast: Secondary | ICD-10-CM | POA: Diagnosis not present

## 2021-06-21 ENCOUNTER — Encounter: Payer: Self-pay | Admitting: *Deleted

## 2021-06-21 ENCOUNTER — Telehealth: Payer: Self-pay | Admitting: *Deleted

## 2021-06-21 NOTE — Progress Notes (Signed)
Oncology Nurse Navigator Documentation  Oncology Nurse Navigator Flowsheets 06/21/2021 06/14/2021 11/19/2019 07/16/2018 05/22/2018 10/06/2017 10/05/2017  Navigator Location [facility]  Referral Date to RadOnc/MedOnc - - - - - - -  Navigator Encounter Type Telephone/I received a message today that patient has new dx of breast cancer.  I called to check on her and we spoke briefly about her new dx.  I listened as she explained. She is set up to see breast surgeon on 12/6 and has a follow up with Dr. Julien Nordmann on 12/22.  She verbalized understanding of upcoming appts.  I asked that she call if she needs our help.   Clinic/MDC Clinic/MDC Telephone Clinic/MDC Telephone Treatment;Other  Telephone Outgoing Call - - Outgoing Call - Outgoing Call -  Patient Visit Type - - MedOnc - - - -  Treatment Phase Treatment Treatment Abnormal Scans Follow-up Follow-up Treatment Treatment  Barriers/Navigation Needs - Education Education Education;Coordination of Care Education Financial;Education Financial  Education - Other Other Other Other Other -  Interventions Psycho-Social Support Psycho-Social Support;Education Education;Psycho-Social Support Coordination of Care;Education Education Education Other  Acuity Level 2-Minimal Needs (1-2 Barriers Identified) Level 2-Minimal Needs (1-2 Barriers Identified) Level 2-Minimal Needs (1-2 Barriers Identified) Level 2 Level 1 Level 2 Level 2  Coordination of Care - - - Other - - -  Education Method - Verbal Verbal Verbal Verbal;Written Verbal -  Support Groups/Services - Other - - - - -  Time Spent with Patient 30 30 15  [phone]

## 2021-06-23 ENCOUNTER — Telehealth: Payer: Self-pay | Admitting: Internal Medicine

## 2021-06-23 NOTE — Telephone Encounter (Signed)
Sch per 11/21 los, pt aware

## 2021-06-25 ENCOUNTER — Encounter: Payer: Self-pay | Admitting: *Deleted

## 2021-06-29 ENCOUNTER — Other Ambulatory Visit: Payer: Self-pay | Admitting: General Surgery

## 2021-06-29 DIAGNOSIS — C50411 Malignant neoplasm of upper-outer quadrant of right female breast: Secondary | ICD-10-CM

## 2021-06-29 DIAGNOSIS — Z170 Estrogen receptor positive status [ER+]: Secondary | ICD-10-CM | POA: Diagnosis not present

## 2021-06-29 DIAGNOSIS — C3491 Malignant neoplasm of unspecified part of right bronchus or lung: Secondary | ICD-10-CM | POA: Diagnosis not present

## 2021-06-30 ENCOUNTER — Other Ambulatory Visit: Payer: Self-pay | Admitting: General Surgery

## 2021-06-30 DIAGNOSIS — Z170 Estrogen receptor positive status [ER+]: Secondary | ICD-10-CM

## 2021-06-30 DIAGNOSIS — C50411 Malignant neoplasm of upper-outer quadrant of right female breast: Secondary | ICD-10-CM

## 2021-07-01 ENCOUNTER — Encounter: Payer: Self-pay | Admitting: *Deleted

## 2021-07-06 ENCOUNTER — Encounter: Payer: Self-pay | Admitting: *Deleted

## 2021-07-06 DIAGNOSIS — D0510 Intraductal carcinoma in situ of unspecified breast: Secondary | ICD-10-CM

## 2021-07-12 ENCOUNTER — Encounter: Payer: Self-pay | Admitting: *Deleted

## 2021-07-12 ENCOUNTER — Other Ambulatory Visit: Payer: Self-pay | Admitting: Internal Medicine

## 2021-07-12 DIAGNOSIS — C3490 Malignant neoplasm of unspecified part of unspecified bronchus or lung: Secondary | ICD-10-CM

## 2021-07-12 MED ORDER — ENTRECTINIB 200 MG PO CAPS: 600.0000 mg | ORAL_CAPSULE | Freq: Every day | ORAL | 3 refills | Status: DC

## 2021-07-13 ENCOUNTER — Other Ambulatory Visit: Payer: Self-pay | Admitting: Medical Oncology

## 2021-07-14 DIAGNOSIS — M810 Age-related osteoporosis without current pathological fracture: Secondary | ICD-10-CM | POA: Diagnosis not present

## 2021-07-14 NOTE — Progress Notes (Signed)
Surgical Instructions   Your procedure is scheduled on Wednesday 07/28/2021.  Report to [facility] Main Entrance "A" at 07:00 A.M., then check in with the Admitting office.  Call [phone] if you have problems or questions between now and the morning of surgery:   Remember: Do not eat after midnight the night before your surgery  You may drink clear liquids until 06:00 A.M. the morning of your surgery.   Clear liquids allowed are: Water, Non-Citrus Juices (without pulp), Carbonated Beverages, Clear Tea, Black Coffee Only (NO MILK, CREAM, or POWDERED CREAMER of any kind), and Gatorade  Please complete your PRE-SURGERY ENSURE that was provided to you by 06:00 A.M. the morning of surgery.  Please, if able, drink it in one sitting. DO NOT SIP.  Do not drink anything else after completing your Ensure.    Take these medicines the morning of surgery with A SIP OF WATER:  Atorvastatin (Lipitor) Cetirizine (Zyrtec) Entrectinib (Rozlytrek)   If needed you may take these medications the morning of surgery: Albuterol (Ventolin) inhaler - Please bring all inhalers with you the day of surgery.    As of today, STOP taking any Aspirin (unless otherwise instructed by your surgeon) or Aspirin-containing products; NSAIDS - Aleve, Naproxen, Ibuprofen, Motrin, Advil, Goody's, BC's, all herbal medications, fish oil, and all vitamins.   3 days leading up to your surgery or after your pre-procedure COVID test You are not required to quarantine however you are required to wear a well-fitting mask when you are out and around people not in your household.  If your mask becomes wet or soiled, replace with a new one.  Wash your hands often with soap and water for 20 seconds or clean your hands with an alcohol-based hand sanitizer that contains at least 60% alcohol.  Do not share personal items.  Notify your provider: if you are in close contact with someone who has COVID  or if you develop a fever of  100.4 or greater, sneezing, cough, sore throat, shortness of breath or body aches.          Do not wear jewelry or makeup  Do not wear lotions, powders, perfumes, or deodorant.  Do not shave 48 hours prior to surgery.   Do not wear nail polish, gel polish, artificial nails, or any other type of covering on natural nails including fingernails and toenails. If patients have artificial nails, gel coating, etc. that need to be removed by a nail salon please have this removed prior to surgery or surgery may need to be canceled/delayed if the surgeon/ anesthesia feels like the patient is unable to be adequately monitored.  Do not bring valuables to the hospital - [facility] is not responsible for any belongings or valuables.  Do NOT Smoke (Tobacco/Vaping) or drink Alcohol 24 hours prior to your procedure  If you use a CPAP at night, please bring your mask for your overnight stay.   Contacts, glasses, hearing aids, dentures or partials may not be worn into surgery, please bring cases for these belongings   For patients admitted to the hospital, discharge time will be determined by your treatment team.   Patients discharged the day of surgery will not be allowed to drive home, and someone needs to stay with them for 24 hours.  NO VISITORS WILL BE ALLOWED IN PRE-OP WHERE PATIENTS ARE PREPPED FOR SURGERY.  ONLY 1 SUPPORT PERSON MAY BE PRESENT IN THE WAITING ROOM WHILE YOU ARE IN SURGERY.  IF YOU ARE TO  BE ADMITTED, ONCE YOU ARE IN YOUR ROOM YOU WILL BE ALLOWED TWO (2) VISITORS. 1 (ONE) VISITOR MAY STAY OVERNIGHT BUT MUST ARRIVE TO THE ROOM BY 8pm.  Minor children may have two parents present. Special consideration for safety and communication needs will be reviewed on a case by case basis.  Special instructions:    Oral Hygiene is also important to reduce your risk of infection.  Remember - BRUSH YOUR TEETH THE MORNING OF SURGERY WITH YOUR REGULAR TOOTHPASTE   [facility]- Preparing For  Surgery  Before surgery, you can play an important role. Because skin is not sterile, your skin needs to be as free of germs as possible. You can reduce the number of germs on your skin by washing with CHG (chlorahexidine gluconate) Soap before surgery.  CHG is an antiseptic cleaner which kills germs and bonds with the skin to continue killing germs even after washing.     Please do not use if you have an allergy to CHG or antibacterial soaps. If your skin becomes reddened/irritated stop using the CHG.  Do not shave (including legs and underarms) for at least 48 hours prior to first CHG shower. It is OK to shave your face.  Please follow these instructions carefully.     Shower the NIGHT BEFORE SURGERY and the MORNING OF SURGERY with CHG Soap.   If you chose to wash your hair, wash your hair first as usual with your normal shampoo. After you shampoo, rinse your hair and body thoroughly to remove the shampoo.    Then [employer] and genitals (private parts) with your normal soap and rinse thoroughly to remove soap.  Next use the CHG Soap as you would any other liquid soap. You can apply CHG directly to the skin and wash gently with a clean washcloth.   Apply the CHG Soap to your body ONLY FROM THE NECK DOWN.  Do not use on open wounds or open sores. Avoid contact with your eyes, ears, mouth and genitals (private parts). Wash Face and genitals (private parts)  with your normal soap.   Wash thoroughly, paying special attention to the area where your surgery will be performed.  Thoroughly rinse your body with warm water from the neck down.  DO NOT shower/wash with your normal soap after using and rinsing off the CHG Soap.  Pat yourself dry with a CLEAN TOWEL.  Wear CLEAN PAJAMAS to bed the night before surgery  Place CLEAN SHEETS on your bed the night before your surgery  DO NOT SLEEP WITH PETS.   Day of Surgery:  Take a shower with CHG soap. Wear Clean/Comfortable clothing the  morning of surgery Do not apply any deodorants/lotions.   Remember to brush your teeth WITH YOUR REGULAR TOOTHPASTE.   Please read over the fact sheets that you were given.

## 2021-07-15 ENCOUNTER — Encounter (HOSPITAL_COMMUNITY)
Admission: RE | Admit: 2021-07-15 | Discharge: 2021-07-15 | Disposition: A | Discharge status: Home / Self Care | Payer: Medicare HMO | Source: Ambulatory Visit | Attending: General Surgery | Admitting: General Surgery

## 2021-07-15 ENCOUNTER — Encounter (HOSPITAL_COMMUNITY): Payer: Self-pay

## 2021-07-15 ENCOUNTER — Inpatient Hospital Stay: Payer: Medicare HMO | Admitting: Internal Medicine

## 2021-07-15 ENCOUNTER — Inpatient Hospital Stay: Payer: Medicare HMO | Attending: Internal Medicine

## 2021-07-15 ENCOUNTER — Other Ambulatory Visit: Payer: Self-pay

## 2021-07-15 VITALS — BP 131/89 | HR 91 | Temp 97.5°F | Resp 18 | Ht 63.0 in | Wt 164.1 lb

## 2021-07-15 DIAGNOSIS — Z87891 Personal history of nicotine dependence: Secondary | ICD-10-CM | POA: Diagnosis not present

## 2021-07-15 DIAGNOSIS — R011 Cardiac murmur, unspecified: Secondary | ICD-10-CM | POA: Insufficient documentation

## 2021-07-15 DIAGNOSIS — J910 Malignant pleural effusion: Secondary | ICD-10-CM | POA: Insufficient documentation

## 2021-07-15 DIAGNOSIS — C3411 Malignant neoplasm of upper lobe, right bronchus or lung: Secondary | ICD-10-CM

## 2021-07-15 DIAGNOSIS — C7951 Secondary malignant neoplasm of bone: Secondary | ICD-10-CM

## 2021-07-15 DIAGNOSIS — I2510 Atherosclerotic heart disease of native coronary artery without angina pectoris: Secondary | ICD-10-CM | POA: Insufficient documentation

## 2021-07-15 DIAGNOSIS — Z85118 Personal history of other malignant neoplasm of bronchus and lung: Secondary | ICD-10-CM | POA: Insufficient documentation

## 2021-07-15 DIAGNOSIS — Z923 Personal history of irradiation: Secondary | ICD-10-CM | POA: Diagnosis not present

## 2021-07-15 DIAGNOSIS — Z01818 Encounter for other preprocedural examination: Secondary | ICD-10-CM | POA: Diagnosis not present

## 2021-07-15 DIAGNOSIS — Z5111 Encounter for antineoplastic chemotherapy: Secondary | ICD-10-CM

## 2021-07-15 DIAGNOSIS — C770 Secondary and unspecified malignant neoplasm of lymph nodes of head, face and neck: Secondary | ICD-10-CM

## 2021-07-15 DIAGNOSIS — R21 Rash and other nonspecific skin eruption: Secondary | ICD-10-CM | POA: Insufficient documentation

## 2021-07-15 DIAGNOSIS — C3490 Malignant neoplasm of unspecified part of unspecified bronchus or lung: Secondary | ICD-10-CM

## 2021-07-15 DIAGNOSIS — C50911 Malignant neoplasm of unspecified site of right female breast: Secondary | ICD-10-CM | POA: Diagnosis not present

## 2021-07-15 DIAGNOSIS — M810 Age-related osteoporosis without current pathological fracture: Secondary | ICD-10-CM

## 2021-07-15 DIAGNOSIS — Z9221 Personal history of antineoplastic chemotherapy: Secondary | ICD-10-CM

## 2021-07-15 DIAGNOSIS — D0511 Intraductal carcinoma in situ of right breast: Secondary | ICD-10-CM

## 2021-07-15 LAB — CBC WITH DIFFERENTIAL (CANCER CENTER ONLY)
Abs Immature Granulocytes: 0.01 10*3/uL (ref 0.00–0.07)
Basophils Absolute: 0 10*3/uL (ref 0.0–0.1)
Basophils Relative: 1 %
Eosinophils Absolute: 0.2 10*3/uL (ref 0.0–0.5)
Eosinophils Relative: 6 %
HCT: 35.9 % — ABNORMAL LOW (ref 36.0–46.0)
Hemoglobin: 12.1 g/dL (ref 12.0–15.0)
Immature Granulocytes: 0 %
Lymphocytes Relative: 28 %
Lymphs Abs: 1 10*3/uL (ref 0.7–4.0)
MCH: 31.4 pg (ref 26.0–34.0)
MCHC: 33.7 g/dL (ref 30.0–36.0)
MCV: 93.2 fL (ref 80.0–100.0)
Monocytes Absolute: 0.5 10*3/uL (ref 0.1–1.0)
Monocytes Relative: 15 %
Neutro Abs: 1.7 10*3/uL (ref 1.7–7.7)
Neutrophils Relative %: 50 %
Platelet Count: 310 10*3/uL (ref 150–400)
RBC: 3.85 MIL/uL — ABNORMAL LOW (ref 3.87–5.11)
RDW: 16.3 % — ABNORMAL HIGH (ref 11.5–15.5)
WBC Count: 3.4 10*3/uL — ABNORMAL LOW (ref 4.0–10.5)
nRBC: 0 % (ref 0.0–0.2)

## 2021-07-15 LAB — CMP (CANCER CENTER ONLY)
ALT: 49 U/L — ABNORMAL HIGH (ref 0–44)
AST: 41 U/L (ref 15–41)
Albumin: 4.4 g/dL (ref 3.5–5.0)
Alkaline Phosphatase: 45 U/L (ref 38–126)
Anion gap: 5 (ref 5–15)
BUN: 25 mg/dL — ABNORMAL HIGH (ref 8–23)
CO2: 32 mmol/L (ref 22–32)
Calcium: 9.5 mg/dL (ref 8.9–10.3)
Chloride: 103 mmol/L (ref 98–111)
Creatinine: 1.12 mg/dL — ABNORMAL HIGH (ref 0.44–1.00)
GFR, Estimated: 55 mL/min — ABNORMAL LOW (ref >60)
Glucose, Bld: 94 mg/dL (ref 70–99)
Potassium: 4.2 mmol/L (ref 3.5–5.1)
Sodium: 140 mmol/L (ref 135–145)
Total Bilirubin: 0.5 mg/dL (ref 0.3–1.2)
Total Protein: 6.8 g/dL (ref 6.5–8.1)

## 2021-07-15 NOTE — Anesthesia Preprocedure Evaluation (Addendum)
Anesthesia Evaluation  Patient identified by MRN, date of birth, ID band Patient awake    Reviewed: Allergy & Precautions, H&P , NPO status , Patient's Chart, lab work & pertinent test results  History of Anesthesia Complications (+) PONV and history of anesthetic complications  Airway Mallampati: II  TM Distance: >3 FB Neck ROM: Full    Dental no notable dental hx. (+) Teeth Intact, Dental Advisory Given   Pulmonary neg pulmonary ROS, former smoker,    Pulmonary exam normal breath sounds clear to auscultation       Cardiovascular Exercise Tolerance: Good + CAD  negative cardio ROS Normal cardiovascular exam+ dysrhythmias + Valvular Problems/Murmurs  Rhythm:Regular Rate:Normal  Nuclear stress test 05/20/20: The left ventricular ejection fraction is hyperdynamic (>65%). Nuclear stress EF: 77%. There was no ST segment deviation noted during stress. The study is normal. This is a low risk study.   Echo 05/19/20: IMPRESSIONS  1. Left ventricular ejection fraction, by estimation, is 60 to 65%. The  left ventricle has normal function. The left ventricle has no regional  wall motion abnormalities. Left ventricular diastolic parameters are  indeterminate. The average left  ventricular global longitudinal strain is -12.2 %.  2. Right ventricular systolic function is normal. The right ventricular  size is normal. There is normal pulmonary artery systolic pressure.  3. The mitral valve is normal in structure. Trivial mitral valve  regurgitation. No evidence of mitral stenosis.  4. Tricuspid valve regurgitation is mild to moderate.  5. The aortic valve is normal in structure. Aortic valve regurgitation is  not visualized. No aortic stenosis is present.    Neuro/Psych Anxiety  Neuromuscular disease negative neurological ROS  negative psych ROS   GI/Hepatic negative GI ROS, Neg liver ROS,   Endo/Other  negative endocrine  ROS  Renal/GU negative Renal ROS  negative genitourinary   Musculoskeletal negative musculoskeletal ROS (+) Arthritis , Osteoarthritis,    Abdominal   Peds negative pediatric ROS (+)  Hematology negative hematology ROS (+)   Anesthesia Other Findings   Reproductive/Obstetrics negative OB ROS                           Anesthesia Physical Anesthesia Plan  ASA: 3  Anesthesia Plan: General   Post-op Pain Management: Ofirmev IV (intra-op)   Induction: Intravenous  PONV Risk Score and Plan: 3 and Ondansetron, Dexamethasone, Midazolam and Treatment may vary due to age or medical condition  Airway Management Planned: Oral ETT and LMA  Additional Equipment: None  Intra-op Plan:   Post-operative Plan: Extubation in OR  Informed Consent: I have reviewed the patients History and Physical, chart, labs and discussed the procedure including the risks, benefits and alternatives for the proposed anesthesia with the patient or authorized representative who has indicated his/her understanding and acceptance.       Plan Discussed with: Anesthesiologist and CRNA  Anesthesia Plan Comments: (PAT note written 07/15/2021 by Myra Gianotti, PA-C.  DISCUSSION: Patient is a 65 year old female scheduled for the above procedure.  History includes former smoker (quit 07/26/75), post-operative N/V, lung cancer (Stage IIIB adenocarcinoma RUL diagnosed 04/2015, s/p chemoradiation; palliative radiotherapy to metastatic left supraclavicular LN and hypermetabolic T2 and left scapula skeletal lesions 2019; SBRT left supraclavicular LN & LUL lung mass completed 04/2020; s/p right thoracentesis for malignant right pleural effusion 04/07/21), coronary atherosclerosis (on 03/20/20 CT), murmur (mild-moderate TR 04/2020 echo), dyslipidemia, dysrhythmia ("skip beat" 20's), chronic pain, skin cancer (BCC), breast cancer (  right ductal carcinoma in situ 06/16/21), left IJ port  (07/03/15-12/12/18). Reported difficulty arousing after anesthesia.    Last seen by cardiologist Dr. Geraldo Pitter in May 2022. No chest pain. Low risk stress test 04/2020. Echo 04/2020 showed normal LVEF, trivial MR, mild-moderate TR.  Last seen by oncologist Dr. Julien Nordmann on 07/15/21 with labs. He is aware of surgery plans.   RSL scheduled for 07/27/20 at 1:30 PM. Anesthesia team to evaluate on the day of surgery. )       Anesthesia Quick Evaluation

## 2021-07-15 NOTE — Progress Notes (Signed)
Anesthesia Chart Review:  Case: [id number] Date/Time: 07/28/21 0845   Procedure: RIGHT BREAST LUMPECTOMY WITH RADIOACTIVE SEED LOCALIZATION X2 (Right: Breast) - 66 MINUTES ROOM 2   Anesthesia type: General   Pre-op diagnosis: RIGHT BREAST CANCER   Location: [facility] OR ROOM 02 / [facility]   Surgeons: Stark Klein, MD       DISCUSSION: Patient is a 65 year old female scheduled for the above procedure.  History includes former smoker (quit 07/26/75), post-operative N/V, lung cancer (Stage IIIB adenocarcinoma RUL diagnosed 04/2015, s/p chemoradiation; palliative radiotherapy to metastatic left supraclavicular LN and hypermetabolic T2 and left scapula skeletal lesions 2019; SBRT left supraclavicular LN & LUL lung mass completed 04/2020; s/p right thoracentesis for malignant right pleural effusion 04/07/21), coronary atherosclerosis (on 03/20/20 CT), murmur (mild-moderate TR 04/2020 echo), dyslipidemia, dysrhythmia ("skip beat" 20's), chronic pain, skin cancer (BCC), breast cancer (right ductal carcinoma in situ 06/16/21), left IJ port (07/03/15-12/12/18). Reported difficulty arousing after anesthesia.    Last seen by cardiologist Dr. Geraldo Pitter in May 2022. No chest pain. Low risk stress test 04/2020. Echo 04/2020 showed normal LVEF, trivial MR, mild-moderate TR.  Last seen by oncologist Dr. Julien Nordmann on 07/15/21 with labs. He is aware of surgery plans.   RSL scheduled for 07/27/20 at 1:30 PM. Anesthesia team to evaluate on the day of surgery.    VS: BP 107/80    Pulse 84    Temp 36.5 C (Oral)    Resp 18    SpO2 98%    PROVIDERS: Masneri, Adele Barthel, DO is PCP [facility]) - Jyl Heinz, MD is cardiologist. Initial evaluation 04/24/20 after CT chest showed coronary calcifications. Murmur also heard on exam. Stress and echo results as below (CV). Last visit 12/22/20 with no new orders. Six month follow-up planned.  Curt Bears, MD is HEM-ONC. Last visit 06/25/21. Current therapy: Entrectinib 600 mg  daily, started 04/23/21, reduced to 400 mg daily six weeks ago, s/p 3 months of treatment. He noted plans for breast surgery.  Tyler Pita, MD is RAD-ONC   LABS: She had labs on 07/15/21 through [facility]. Results included: Lab Results  Component Value Date   WBC 3.4 (L) 07/15/2021   HGB 12.1 07/15/2021   HCT 35.9 (L) 07/15/2021   PLT 310 07/15/2021   GLUCOSE 94 07/15/2021   ALT 49 (H) 07/15/2021   AST 41 07/15/2021   NA 140 07/15/2021   K 4.2 07/15/2021   CL 103 07/15/2021   CREATININE 1.12 (H) 07/15/2021   BUN 25 (H) 07/15/2021   CO2 32 07/15/2021    IMAGES: CTA Chest/abd/pelvis 06/11/21: IMPRESSIONS   1. Left ventricular ejection fraction, by estimation, is 60 to 65%. The  left ventricle has normal function. The left ventricle has no regional  wall motion abnormalities. Left ventricular diastolic parameters are  indeterminate. The average left  ventricular global longitudinal strain is -12.2 %.   2. Right ventricular systolic function is normal. The right ventricular  size is normal. There is normal pulmonary artery systolic pressure.   3. The mitral valve is normal in structure. Trivial mitral valve  regurgitation. No evidence of mitral stenosis.   4. Tricuspid valve regurgitation is mild to moderate.   5. The aortic valve is normal in structure. Aortic valve regurgitation is  not visualized. No aortic stenosis is present.   MRI Brain 05/17/21: IMPRESSION: No acute intracranial process. No evidence of metastatic disease in the brain.   EKG: 07/15/21:  Normal sinus rhythm Possible Left atrial  enlargement Low voltage QRS Borderline ECG   CV: Nuclear stress test 05/20/20: The left ventricular ejection fraction is hyperdynamic (>65%). Nuclear stress EF: 77%. There was no ST segment deviation noted during stress. The study is normal. This is a low risk study.   Echo 05/19/20: IMPRESSIONS   1. Left ventricular ejection fraction, by estimation, is 60 to  65%. The  left ventricle has normal function. The left ventricle has no regional  wall motion abnormalities. Left ventricular diastolic parameters are  indeterminate. The average left  ventricular global longitudinal strain is -12.2 %.   2. Right ventricular systolic function is normal. The right ventricular  size is normal. There is normal pulmonary artery systolic pressure.   3. The mitral valve is normal in structure. Trivial mitral valve  regurgitation. No evidence of mitral stenosis.   4. Tricuspid valve regurgitation is mild to moderate.   5. The aortic valve is normal in structure. Aortic valve regurgitation is  not visualized. No aortic stenosis is present.    Past Medical History:  Diagnosis Date   Age related osteoporosis 12/18/2020   Anxiety    Atherosclerosis 12/18/2020   Back pain    right back lateral   BCC (basal cell carcinoma of skin)    Bone metastasis ([place]) 10/04/2017   Cancer of upper lobe of right lung ([place]) 05/19/2015   Cardiac murmur 04/24/2020   Chest discomfort 04/24/2020   Chronic pain 12/18/2020   Colon polyps    Complication of anesthesia    pt. states that she is difficult to arouse   Coronary atherosclerosis 04/24/2020   Diverticulosis    Dysrhythmia    "skip beat in early 20's"   Encounter for antineoplastic chemotherapy 06/08/2015   Generalized rash 06/24/2015   H/O cold sores    History of anemia    Lung cancer (HCC)    RUL mass -being tx- radiation and chemo- remains last chemo 01-04-16   Lung field abnormal 12/18/2020   Metastasis to supraclavicular lymph node ([place]) 10/04/2017   Metastatic non-small cell lung cancer ([place]) 03/24/2020   Mixed dyslipidemia 04/24/2020   Oral lesion 06/24/2015   Osteoporosis    Personal history of colonic polyps 12/18/2020   PONV (postoperative nausea and vomiting)    Port catheter in place 11/20/2015   Rectal bleeding    right upper lobe lung mass 04/15/2015   malignant tumor found - 10'16-radiation and chemotherapy  and remains with chemo at present- Dr. Earlie Server follows.   Sciatica 12/18/2020   Vitamin D deficiency 12/18/2020    Past Surgical History:  Procedure Laterality Date   BREAST EXCISIONAL BIOPSY Right    BREAST EXCISIONAL BIOPSY Right    BREAST EXCISIONAL BIOPSY Right    COLONOSCOPY     COLONOSCOPY W/ POLYPECTOMY     COLONOSCOPY WITH PROPOFOL N/A 01/20/2016   Procedure: COLONOSCOPY WITH PROPOFOL;  Surgeon: Arta Silence, MD;  Location: [facility];  Service: Endoscopy;  Laterality: N/A;   CYSTECTOMY     right breast x 2   IR REMOVAL TUN ACCESS W/ PORT W/O FL MOD SED  12/12/2018   MEDIASTINOSCOPY N/A 05/07/2015   Procedure: MEDIASTINOSCOPY;  Surgeon: Ivin Poot, MD;  Location: [facility];  Service: Thoracic;  Laterality: N/A;   TONSILLECTOMY     VIDEO BRONCHOSCOPY WITH ENDOBRONCHIAL ULTRASOUND N/A 05/07/2015   Procedure: VIDEO BRONCHOSCOPY WITH ENDOBRONCHIAL ULTRASOUND;  Surgeon: Ivin Poot, MD;  Location: [facility];  Service: Thoracic;  Laterality: N/A;    MEDICATIONS:  Acetylcysteine (NAC  600) 600 MG CAPS   albuterol (VENTOLIN HFA) 108 (90 Base) MCG/ACT inhaler   atorvastatin (LIPITOR) 10 MG tablet   Biotin 10000 MCG TABS   Calcium Carb-Cholecalciferol (CALCIUM 1000 + D PO)   cetirizine (ZYRTEC) 10 MG tablet   denosumab (PROLIA) 60 MG/ML SOSY injection   entrectinib (ROZLYTREK) 200 MG capsule   L-Lysine 1000 MG TABS   magnesium oxide (MAG-OX) 400 MG tablet   Melatonin 3 MG CAPS   Multiple Vitamins-Minerals (PRESERVISION AREDS) CAPS   niacin 500 MG tablet   nitroGLYCERIN (NITROSTAT) 0.4 MG SL tablet   Omega-3 Fatty Acids (RA FISH OIL PO)   PREBIOTIC PRODUCT PO   Psyllium (REGULOID PO)   senna (SENOKOT) 8.6 MG tablet   Turmeric Curcumin 500 MG CAPS   vitamin B-12 (CYANOCOBALAMIN) 1000 MCG tablet   No current facility-administered medications for this encounter.    Myra Gianotti, PA-C Surgical Short Stay/Anesthesiology [facility] Phone [phone] [facility] Phone [phone] 07/15/2021 12:53 PM

## 2021-07-15 NOTE — Progress Notes (Signed)
[place] [phone]   [phone]  OFFICE PROGRESS NOTE  Lyman Bishop, DO [address] [place] [id number]  DIAGNOSIS:  1) Stage IIIB (T2a, N3, M0) non-small cell lung cancer, adenocarcinoma with negative EGFR mutation and negative ALK gene translocation diagnosed in October 2016. 2) right breast ductal carcinoma in situ diagnosed in December 2022.  Biomarker Findings Microsatellite status - MS-Stable Tumor Mutational Burden - TMB-Low (5 Muts/Mb) Genomic Findings For a complete list of the genes assayed, please refer to the Appendix. ROS1 SDC4-ROS1 fusion RBM10 N539f*14 TP53 R273C 7 Disease relevant genes with no reportable alterations: EGFR, KRAS, ALK, BRAF, MET, RET, ERBB2  PRIOR THERAPY:  1) Concurrent chemoradiation with weekly carboplatin for AUC of 2 and paclitaxel 45 MG/M2 status post 6 cycles. Last dose was given 07/13/2015 with partial response. 2) Systemic chemotherapy with carboplatin for AUC of 5 and Alimta 500 MG/M2 every 3 weeks. First dose 11/02/2015. Status post 6 cycles. 3) palliative stereotactic radiotherapy to the left supraclavicular lymph node under the care of Dr. MTammi Klippel  Loss of fraction of radiotherapy scheduled for November 01, 2017 4) palliative radiotherapy to the left supraclavicular lymph node as well as left upper lobe lung mass completed May 04, 2020 under the care of Dr. MTammi Klippel  CURRENT THERAPY: Entrectinib 600 mg p.o. daily.  Started April 23, 2021.  Rose Figueroa dose was reduced to 400 mg p.o. daily 6 weeks ago.  Status post 3 months of treatment  INTERVAL HISTORY: Rose BRAU65y.[id number]. female returns to the clinic today for follow-up visit.  The patient is feeling fine today with no concerning complaints except for some hives that started on the face few days ago and improving slowly.  She denied having any current chest pain, shortness of breath, cough or hemoptysis.  She denied having  any fever or chills.  She has no nausea, vomiting, diarrhea or constipation.  She has no headache or visual changes.  She denied having any significant weight loss or night sweats.  She continues to tolerate Rose Figueroa treatment with Entrectinib fairly well.  She is here today for evaluation and repeat blood work.  She was also recently diagnosed with ductal carcinoma in situ of the right breast and she is scheduled for surgical resection on July 28, 2021 under the care of Dr. BBarry Dienes  MEDICAL HISTORY: Past Medical History:  Diagnosis Date   Age related osteoporosis 12/18/2020   Anxiety    Atherosclerosis 12/18/2020   Back pain    right back lateral   BCC (basal cell carcinoma of skin)    Bone metastasis ([facility] 10/04/2017   Cancer of upper lobe of right lung ([facility] 05/19/2015   Cardiac murmur 04/24/2020   Chest discomfort 04/24/2020   Chronic pain 12/18/2020   Colon polyps    Complication of anesthesia    pt. states that she is difficult to arouse   Coronary atherosclerosis 04/24/2020   Diverticulosis    Dysrhythmia    "skip beat in early 20's"   Encounter for antineoplastic chemotherapy 06/08/2015   Generalized rash 06/24/2015   H/O cold sores    History of anemia    Lung cancer (HCC)    RUL mass -being tx- radiation and chemo- remains last chemo 01-04-16   Lung field abnormal 12/18/2020   Metastasis to supraclavicular lymph node ([facility] 10/04/2017   Metastatic non-small cell lung cancer ([facility] 03/24/2020   Mixed dyslipidemia 04/24/2020   Oral lesion  06/24/2015   Osteoporosis    Personal history of colonic polyps 12/18/2020   PONV (postoperative nausea and vomiting)    Port catheter in place 11/20/2015   Rectal bleeding    right upper lobe lung mass 04/15/2015   malignant tumor found - 10'16-radiation and chemotherapy and remains with chemo at present- Dr. Earlie Server follows.   Sciatica 12/18/2020   Vitamin D deficiency 12/18/2020    ALLERGIES:  is allergic to sod picosulfate-mag ox-cit  acd.  MEDICATIONS:  Current Outpatient Medications  Medication Sig Dispense Refill   Acetylcysteine (NAC 600) 600 MG CAPS Take 600 mg by mouth daily.     albuterol (VENTOLIN HFA) 108 (90 Base) MCG/ACT inhaler Inhale 2 puffs into the lungs every 6 (six) hours as needed for wheezing or shortness of breath.     atorvastatin (LIPITOR) 10 MG tablet TAKE 1 TABLET BY MOUTH EVERY DAY (Patient taking differently: Take 5 mg by mouth daily.) 90 tablet 1   Biotin 10000 MCG TABS Take 10,000 mcg by mouth daily.     Calcium Carb-Cholecalciferol (CALCIUM 1000 + D PO) Take 1 tablet by mouth 2 (two) times daily. 1000 mg /1200 mg     cetirizine (ZYRTEC) 10 MG tablet Take 10 mg by mouth daily.     denosumab (PROLIA) 60 MG/ML SOSY injection Inject 60 mg into the skin every 6 (six) months.     entrectinib (ROZLYTREK) 200 MG capsule Take 3 capsules (600 mg total) by mouth daily. Swallow capsules whole. Do not open, crush, chew, or dissolve the contents. (Patient taking differently: Take 400 mg by mouth daily. Swallow capsules whole. Do not open, crush, chew, or dissolve the contents.) 90 capsule 3   L-Lysine 1000 MG TABS Take 1,000 mg by mouth daily.      magnesium oxide (MAG-OX) 400 MG tablet Take 400 mg by mouth daily.     Melatonin 3 MG CAPS Take 3 mg by mouth at bedtime.     Multiple Vitamins-Minerals (PRESERVISION AREDS) CAPS Take 1 capsule by mouth daily.     niacin 500 MG tablet Take 500 mg by mouth daily.     nitroGLYCERIN (NITROSTAT) 0.4 MG SL tablet Place 0.4 mg under the tongue every 5 (five) minutes as needed for chest pain.     Omega-3 Fatty Acids (RA FISH OIL PO) Take 1,200 mg by mouth daily.     PREBIOTIC PRODUCT PO Take 1 tablet by mouth daily. culterelle     Psyllium (REGULOID PO) Take 750 mg by mouth daily as needed (Constipation).     senna (SENOKOT) 8.6 MG tablet Take 3 tablets by mouth 2 (two) times daily.     Turmeric Curcumin 500 MG CAPS Take 500 mg by mouth daily.     vitamin B-12  (CYANOCOBALAMIN) 1000 MCG tablet Take 1,000 mcg by mouth daily.     No current facility-administered medications for this visit.    SURGICAL HISTORY:  Past Surgical History:  Procedure Laterality Date   BREAST EXCISIONAL BIOPSY Right    BREAST EXCISIONAL BIOPSY Right    BREAST EXCISIONAL BIOPSY Right    COLONOSCOPY     COLONOSCOPY W/ POLYPECTOMY     COLONOSCOPY WITH PROPOFOL N/A 01/20/2016   Procedure: COLONOSCOPY WITH PROPOFOL;  Surgeon: Arta Silence, MD;  Location: [facility];  Service: Endoscopy;  Laterality: N/A;   CYSTECTOMY     right breast x 2   IR REMOVAL TUN ACCESS W/ PORT W/O FL MOD SED  12/12/2018   MEDIASTINOSCOPY N/A 05/07/2015  Procedure: MEDIASTINOSCOPY;  Surgeon: Ivin Poot, MD;  Location: [facility];  Service: Thoracic;  Laterality: N/A;   TONSILLECTOMY     VIDEO BRONCHOSCOPY WITH ENDOBRONCHIAL ULTRASOUND N/A 05/07/2015   Procedure: VIDEO BRONCHOSCOPY WITH ENDOBRONCHIAL ULTRASOUND;  Surgeon: Ivin Poot, MD;  Location: [facility];  Service: Thoracic;  Laterality: N/A;    REVIEW OF SYSTEMS:  A comprehensive review of systems was negative except for: Constitutional: positive for fatigue Integument/breast: positive for rash   PHYSICAL EXAMINATION: General appearance: alert, cooperative, fatigued and no distress Head: Normocephalic, without obvious abnormality, atraumatic Neck: no adenopathy, no JVD, supple, symmetrical, trachea midline and thyroid not enlarged, symmetric, no tenderness/mass/nodules Lymph nodes: Cervical, supraclavicular, and axillary nodes normal. Resp: clear to auscultation bilaterally Back: symmetric, no curvature. ROM normal. No CVA tenderness. Cardio: regular rate and rhythm, S1, S2 normal, no murmur, click, rub or gallop GI: soft, non-tender; bowel sounds normal; no masses,  no organomegaly Extremities: extremities normal, atraumatic, no cyanosis or edema Neurological exam unremarkable  ECOG PERFORMANCE STATUS: 1 - Symptomatic but  completely ambulatory  Blood pressure 131/89, pulse 91, temperature (!) 97.5 F (36.4 C), temperature source Tympanic, resp. rate 18, height 5' 3"  (1.6 m), weight 164 lb 1 oz (74.4 kg), SpO2 98 %.  LABORATORY DATA: Lab Results  Component Value Date   WBC 3.4 (L) 07/15/2021   HGB 12.1 07/15/2021   HCT 35.9 (L) 07/15/2021   MCV 93.2 07/15/2021   PLT 310 07/15/2021      Chemistry      Component Value Date/Time   NA 140 07/15/2021 0923   NA 139 03/20/2017 1038   K 4.2 07/15/2021 0923   K 4.3 03/20/2017 1038   CL 103 07/15/2021 0923   CO2 32 07/15/2021 0923   CO2 27 03/20/2017 1038   BUN 25 (H) 07/15/2021 0923   BUN 15.4 03/20/2017 1038   CREATININE 1.12 (H) 07/15/2021 0923   CREATININE 0.7 03/20/2017 1038      Component Value Date/Time   CALCIUM 9.5 07/15/2021 0923   CALCIUM 9.8 03/20/2017 1038   ALKPHOS 45 07/15/2021 0923   ALKPHOS 37 (L) 03/20/2017 1038   AST 41 07/15/2021 0923   AST 25 03/20/2017 1038   ALT 49 (H) 07/15/2021 0923   ALT 22 03/20/2017 1038   BILITOT 0.5 07/15/2021 0923   BILITOT 0.42 03/20/2017 1038       RADIOGRAPHIC STUDIES: MM CLIP PLACEMENT RIGHT  Result Date: 06/16/2021 CLINICAL DATA:  Status post stereotactic biopsy for 2 separate groups of calcifications within the upper central RIGHT breast, at posterior depth (posterior group) and middle depth (anterior group) respectively. EXAM: 3D DIAGNOSTIC RIGHT MAMMOGRAM POST ULTRASOUND BIOPSY COMPARISON:  Previous exam(s). FINDINGS: 3D Mammographic images were obtained following stereotactic guided biopsy of 2 separate groups of indeterminate calcifications within the upper central RIGHT breast, at posterior depth and middle depth respectively. The biopsy marking clips are in expected position at the sites of biopsy. IMPRESSION: 1. X shaped biopsy marking clip is displaced approximately 1 cm inferior to the site of biopsy in the upper central RIGHT breast at posterior depth. 2. Appropriate positioning of  the coil shaped biopsy marking clip at the site of biopsy in the upper central RIGHT breast at middle depth. 3. Patient had prolonged bleeding after the procedure. Quick clot was applied and hemostasis obtained. No significant post biopsy hematoma is seen at the biopsy site itself on the postprocedure mammogram. Final Assessment: Post Procedure Mammograms for Marker Placement Electronically Signed  By: Franki Cabot M.D.   On: 06/16/2021 11:37  MM RT BREAST BX W LOC DEV 1ST LESION IMAGE BX SPEC STEREO GUIDE  Addendum Date: 06/22/2021   ADDENDUM REPORT: 06/22/2021 09:29 ADDENDUM: Pathology revealed DUCTAL CARCINOMA IN SITU, HIGH-GRADE WITH NECROSIS AND CALCIFICATIONS of the RIGHT breast, upper central, posterior depth, (posterior group) - X clip. This was found to be concordant by Dr. Franki Cabot. Pathology revealed DUCTAL CARCINOMA IN SITU, HIGH-GRADE WITH NECROSIS AND CALCIFICATIONS of the RIGHT breast, upper central, middle depth, (anterior group) - coil clip. This was found to be concordant by Dr. Franki Cabot. Pathology results were discussed with the patient by telephone. The patient reported doing well after the biopsies with tenderness at the sites. Post biopsy instructions and care were reviewed and questions were answered. The patient was encouraged to call [facility] for any additional concerns. Surgical consultation has been arranged with Dr. Stark Klein at [facility] on June 29, 2021. Pathology results reported by Stacie Acres RN on 06/22/2021. Electronically Signed   By: Franki Cabot M.D.   On: 06/22/2021 09:29   Result Date: 06/22/2021 CLINICAL DATA:  Patient presents today for stereotactic-guided biopsies of 2 nearby groups of calcifications located in the upper central RIGHT breast. EXAM: RIGHT BREAST STEREOTACTIC CORE NEEDLE BIOPSY x2 COMPARISON:  Previous exams. FINDINGS: The patient and I discussed the procedure of stereotactic-guided biopsy  including benefits and alternatives. We discussed the high likelihood of a successful procedure. We discussed the risks of the procedure including infection, bleeding, tissue injury, clip migration, and inadequate sampling. Informed written consent was given. The usual time out protocol was performed immediately prior to the procedure. Site 1: Using sterile technique and 1% Lidocaine as local anesthetic, under stereotactic guidance, a 9 gauge vacuum assisted device was used to perform core needle biopsy of calcifications in the upper central RIGHT breast at posterior depth (posterior group) using a superior approach. Specimen radiograph was performed showing calcifications. Specimens with calcifications are identified for pathology. Lesion quadrant: Upper central At the conclusion of the procedure, X shaped tissue marker clip was deployed into the biopsy cavity. Site 2: Using sterile technique and 1% Lidocaine as local anesthetic, under stereotactic guidance, a 9 gauge vacuum assisted device was used to perform core needle biopsy of calcifications in the upper central RIGHT breast at middle depth (anterior group) using a superior approach. Specimen radiograph was performed showing calcifications. Specimens with calcifications are identified for pathology. Lesion quadrant: Upper central At the conclusion of the procedure, coil shaped tissue marker clip was deployed into the biopsy cavity. Follow-up 2-view mammogram was performed and dictated separately. IMPRESSION: 1. Stereotactic-guided biopsy of indeterminate calcifications within the upper central RIGHT breast, at posterior depth (posterior group). X shaped clip placed at biopsy site. No apparent complications. 2. Stereotactic-guided biopsy of indeterminate calcifications within the upper central RIGHT breast, at middle depth (anterior group). Coil shaped clip placed at biopsy site. No apparent complications. Electronically Signed: By: Franki Cabot M.D. On:  06/16/2021 11:13  MM RT BREAST BX W LOC DEV EA AD LESION IMG BX SPEC STEREO GUIDE  Addendum Date: 06/22/2021   ADDENDUM REPORT: 06/22/2021 09:29 ADDENDUM: Pathology revealed DUCTAL CARCINOMA IN SITU, HIGH-GRADE WITH NECROSIS AND CALCIFICATIONS of the RIGHT breast, upper central, posterior depth, (posterior group) - X clip. This was found to be concordant by Dr. Franki Cabot. Pathology revealed DUCTAL CARCINOMA IN SITU, HIGH-GRADE WITH NECROSIS AND CALCIFICATIONS of the RIGHT breast, upper central, middle  depth, (anterior group) - coil clip. This was found to be concordant by Dr. Franki Cabot. Pathology results were discussed with the patient by telephone. The patient reported doing well after the biopsies with tenderness at the sites. Post biopsy instructions and care were reviewed and questions were answered. The patient was encouraged to call [facility] for any additional concerns. Surgical consultation has been arranged with Dr. Stark Klein at [facility] on June 29, 2021. Pathology results reported by Stacie Acres RN on 06/22/2021. Electronically Signed   By: Franki Cabot M.D.   On: 06/22/2021 09:29   Result Date: 06/22/2021 CLINICAL DATA:  Patient presents today for stereotactic-guided biopsies of 2 nearby groups of calcifications located in the upper central RIGHT breast. EXAM: RIGHT BREAST STEREOTACTIC CORE NEEDLE BIOPSY x2 COMPARISON:  Previous exams. FINDINGS: The patient and I discussed the procedure of stereotactic-guided biopsy including benefits and alternatives. We discussed the high likelihood of a successful procedure. We discussed the risks of the procedure including infection, bleeding, tissue injury, clip migration, and inadequate sampling. Informed written consent was given. The usual time out protocol was performed immediately prior to the procedure. Site 1: Using sterile technique and 1% Lidocaine as local anesthetic, under stereotactic  guidance, a 9 gauge vacuum assisted device was used to perform core needle biopsy of calcifications in the upper central RIGHT breast at posterior depth (posterior group) using a superior approach. Specimen radiograph was performed showing calcifications. Specimens with calcifications are identified for pathology. Lesion quadrant: Upper central At the conclusion of the procedure, X shaped tissue marker clip was deployed into the biopsy cavity. Site 2: Using sterile technique and 1% Lidocaine as local anesthetic, under stereotactic guidance, a 9 gauge vacuum assisted device was used to perform core needle biopsy of calcifications in the upper central RIGHT breast at middle depth (anterior group) using a superior approach. Specimen radiograph was performed showing calcifications. Specimens with calcifications are identified for pathology. Lesion quadrant: Upper central At the conclusion of the procedure, coil shaped tissue marker clip was deployed into the biopsy cavity. Follow-up 2-view mammogram was performed and dictated separately. IMPRESSION: 1. Stereotactic-guided biopsy of indeterminate calcifications within the upper central RIGHT breast, at posterior depth (posterior group). X shaped clip placed at biopsy site. No apparent complications. 2. Stereotactic-guided biopsy of indeterminate calcifications within the upper central RIGHT breast, at middle depth (anterior group). Coil shaped clip placed at biopsy site. No apparent complications. Electronically Signed: By: Franki Cabot M.D. On: 06/16/2021 11:13   ASSESSMENT AND PLAN:  This is a very pleasant 65 years old white female with metastatic non-small cell lung cancer initially diagnosed as stage IIIB non-small cell lung cancer, adenocarcinoma with negative EGFR, ALK mutations diagnosed in October 2016.  Molecular studies showed positive ROS 1 She is status post course of concurrent chemoradiation followed by consolidation chemotherapy with carboplatin and  Alimta. The patient has been in observation for close to 3 years. She underwent palliative radiotherapy to the left supraclavicular lymph nodes with improvement of Rose Figueroa disease. She also underwent SBRT to left upper lobe lung nodule as well as left supraclavicular lymphadenopathy under the care of Dr. Tammi Klippel completed in October 2021. She was found on recent CT scan of the chest to have evidence for disease recurrence in addition to malignant right pleural effusion. The patient started treatment with Entrectinib 600 mg p.o. daily and she has a lot of trouble with the treatment including fatigue, paresthesia, feeling over the most of  the time and imbalance of Rose Figueroa gait.  Rose Figueroa dose of Entrectinib was reduced to 400 mg p.o. daily several weeks ago and she has been tolerating the lower dose much better.  She has occasional hives and rash. I recommended for the patient to continue Rose Figueroa current treatment with Entrectinib 400 mg p.o. daily. For the recently diagnosed right breast ductal carcinoma in situ, she is scheduled for surgical resection on July 28, 2021 under the care of Dr. Barry Dienes. I will see the patient back for follow-up visit in 1 months for evaluation and repeat blood work. She was advised to call immediately if she has any other concerning symptoms in the interval. The patient voices understanding of current disease status and treatment options and is in agreement with the current care plan. All questions were answered. The patient knows to call the clinic with any problems, questions or concerns. We can certainly see the patient much sooner if necessary.  Disclaimer: This note was dictated with voice recognition software. Similar sounding words can inadvertently be transcribed and may not be corrected upon review.

## 2021-07-15 NOTE — Progress Notes (Signed)
PCP - Crissie Sickles, DO-Pt states she has not seen her yet. She sees whoever is available at [facility].  Cardiologist - Dr. Lennox Pippins  PPM/ICD - n/a  Chest x-ray - 04/07/21 EKG - 07/15/21 Stress Test - 05/20/20 ECHO - 05/19/20 Cardiac Cath - denies  Sleep Study - denies CPAP - denies  Blood Thinner Instructions: n/a Aspirin Instructions: n/a  ERAS Protcol -Clear liquids until 0600 DOS. PRE-SURGERY Ensure or G2- (1) Ensure provided.  COVID TEST- Not indicated. Ambulatory procedure.  Anesthesia review: Yes, cardiac history.  Patient denies shortness of breath, fever, cough and chest pain at PAT appointment   All instructions explained to the patient, with a verbal understanding of the material. Patient agrees to go over the instructions while at home for a better understanding. Patient also instructed to self quarantine after being tested for COVID-19. The opportunity to ask questions was provided.

## 2021-07-23 ENCOUNTER — Telehealth: Payer: Self-pay | Admitting: Internal Medicine

## 2021-07-23 NOTE — Telephone Encounter (Signed)
Sch per 12/21 los, pt aware

## 2021-07-27 ENCOUNTER — Ambulatory Visit
Admission: RE | Admit: 2021-07-27 | Discharge: 2021-07-27 | Disposition: A | Discharge status: Home / Self Care | Payer: Medicare HMO | Source: Ambulatory Visit | Attending: General Surgery | Admitting: General Surgery

## 2021-07-27 DIAGNOSIS — C50911 Malignant neoplasm of unspecified site of right female breast: Secondary | ICD-10-CM | POA: Diagnosis not present

## 2021-07-27 DIAGNOSIS — C50411 Malignant neoplasm of upper-outer quadrant of right female breast: Secondary | ICD-10-CM

## 2021-07-27 NOTE — H&P (Signed)
REFERRING PHYSICIAN:  Enriqueta Shutter   PROVIDER:  Georgianne Fick, MD   Care Team: Patient Care Team: Crissie Sickles as PCP - General    MRN: [id number] DOB: 07-07-1956 DATE OF ENCOUNTER: 06/29/2021   Subjective    Chief Complaint: Right Breast Cancer       History of Present Illness: Rose Figueroa is a 66 y.o. female who is seen today as an office consultation at the request of Dr. Curly Rim for evaluation of Right Breast Cancer    Pt has new diagnosis of right breast cancer 05/2021.  Pt presented with screening detected right breast calcifications.  There were two sites noted in the upper right breast, one being anterior and around 8 mm and the other being more posterior at 3 mm in extent.  The total span of these is 2.2 cm.  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  They were both ER + weakly and PR -. She has had two surgical biopsies before on the right breast for benign issues.    Of note, she has been treated for right sided stage IIIB (T2aN3) non small cell lung cancer by Dr. Julien Nordmann.  This was dx in 2016.  She hs received chemo in 2016 and 2017 and radiation to the left supraclavicular node as well as the lung mass in 2019 and 2021 respectively.  She is now on Entrectinib for disease progression in the chest with a pleural effusion.         Diagnostic mammogram:BCG 05/29/21 ACR Breast Density Category b: There are scattered areas of fibroglandular density.   FINDINGS: On today's additional diagnostic views, including magnification views, 2 nearby groups of calcifications are confirmed.   The more anterior group demonstrate coarse heterogeneous, possibly casting, morphology and measure 8 mm extent.   The more posterior calcifications demonstrate punctate and fine pleomorphic morphologies and measure 3 mm extent.   Overall, the extent of calcifications measures 2.2 cm.   IMPRESSION: Recommend 2 site stereotactic  biopsies targeting the anterior and posterior groups of calcifications in the upper RIGHT breast, as detailed above.   RECOMMENDATION: Two site stereotactic biopsies for the RIGHT breast calcifications.   Stereotactic biopsies are scheduled for November 17th.   I have discussed the findings and recommendations with the patient. If applicable, a reminder letter will be sent to the patient regarding the next appointment.   BI-RADS CATEGORY  4: Suspicious.     Pathology core needle biopsy: 06/16/21 1. Breast, right, needle core biopsy, upper central, posterior depth, posterior group, x shaped clip - DUCTAL CARCINOMA IN SITU, HIGH-GRADE WITH NECROSIS AND CALCIFICATIONS. SEE NOTE 2. Breast, right, needle core biopsy, upper central, middle depth, anterior group, coil shaped clip - DUCTAL CARCINOMA IN SITU, HIGH-GRADE WITH NECROSIS AND CALCIFICATIONS. SEE NOTE   Receptors: Estrogen Receptor: 10%, POSITIVE, WEAK STAINING INTENSITY Progesterone Receptor: 0%, NEGATIVE     Review of Systems: A complete review of systems was obtained from the patient.  I have reviewed this information and discussed as appropriate with the patient.  See HPI as well for other ROS.   Review of Systems  Respiratory: Positive for shortness of breath.   Cardiovascular: Positive for leg swelling.  Gastrointestinal: Positive for constipation.  Endo/Heme/Allergies: Bruises/bleeds easily.  All other systems reviewed and are negative.       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)   Personal history of colonic polyps   Malignant neoplasm of upper-outer quadrant of right breast in female, estrogen  receptor positive (CMS-HCC)      Past Surgical History       Past Surgical History:  Procedure Laterality Date   Right Breast Cyst Removal   1990    twice - benign cysts        Allergies      Allergies  Allergen Reactions   Sod Picosulf-Mag Ox-Citric Ac Other (See Comments)              Current Outpatient Medications on File Prior to Visit  Medication Sig Dispense Refill   atorvastatin (LIPITOR) 10 MG tablet 1/2 tablet       entrectinib (ROZLYTREK) 200 mg capsule Take by mouth       niacin 500 MG tablet Take 1 tablet (500 mg total) by mouth once daily       biotin 10 mg Tab Take by mouth       cyanocobalamin (VITAMIN B12) 1000 MCG tablet Take by mouth       magnesium oxide,aspartate,citr 400 mg magnesium Cap as directed       melatonin 3 mg Cap Take 1 capsule by mouth at bedtime       omega 3-dha-epa-fish oil 300 mg-100 mg- 150 mg-1,000 mg Cap 1 capsule        No current facility-administered medications on file prior to visit.      Family History       Family History  Problem Relation Age of Onset   Skin cancer Mother     Obesity Mother     High blood pressure (Hypertension) Mother     Hyperlipidemia (Elevated cholesterol) Mother     Coronary Artery Disease (Blocked arteries around heart) Mother     Colon cancer Mother     Stroke Father     Skin cancer Father     Hyperlipidemia (Elevated cholesterol) Sister     Obesity Sister          Social History        Tobacco Use  Smoking Status Former   Types: Cigarettes   Quit date: 1982   Years since quitting: 40.9  Smokeless Tobacco Never      Social History  Social History         Socioeconomic History   Marital status: Married  Tobacco Use   Smoking status: Former      Types: Cigarettes      Quit date: 1982      Years since quitting: 40.9   Smokeless tobacco: Never  Substance and Sexual Activity   Alcohol use: Never   Drug use: Never        Objective:         Vitals:    06/29/21 1206   BP: 124/82  Pulse: 85  Temp: 36.6 C (97.9 F)  SpO2: 97%  Weight: 73.8 kg (162 lb 12.8 oz)  Height: 160 cm (5\' 3" )    Body mass index is 28.84 kg/m.       Gen:  No acute distress.  Well nourished and well groomed.   Neurological: Alert and oriented to person, place,  and time. Coordination normal.  Head: Normocephalic and atraumatic.  Eyes: Conjunctivae are normal. Pupils are equal, round, and reactive to light. No scleral icterus.  Neck: Normal range of motion. Neck supple. No tracheal deviation or thyromegaly present.  Cardiovascular: Normal rate, regular rhythm, normal heart sounds and intact distal pulses.  Exam reveals no gallop and no friction rub.  No murmur heard. Breast: right breast with bruising in the UOQ at the biopsy sites.  Lateral breast well healed scar and superior circumareolar scar.  No palpable masses.  No LAD. No nipple retraction or nipple discharge.  Breasts relatively symmetric in size.  Mild to moderate ptosis.  Left breast benign.   Respiratory: Effort normal.  No respiratory distress. No chest wall tenderness. Breath sounds normal.  No wheezes, rales or rhonchi.  GI: Soft. Bowel sounds are normal. The abdomen is soft and nontender.  There is no rebound and no guarding.  Musculoskeletal: Normal range of motion. Extremities are nontender.  Lymphadenopathy: No cervical, preauricular, postauricular or axillary adenopathy is present Skin: Skin is warm and dry. No rash noted. No diaphoresis. No erythema. No pallor. No clubbing, cyanosis, or edema.   Psychiatric: Normal mood and affect. Behavior is normal. Judgment and thought content normal.      Labs WBCs 2.9 K at last check 06/11/2021.  Set for recheck 12/22.    Assessment and Plan:    Malignant neoplasm of upper-outer quadrant of right breast in female, estrogen receptor positive (CMS-HCC) Pt has a new diagnosis of cTis right breast cancer.  Will plan seed bracketed right breast lumpectomy.   I think this  is reasonable given that her lung cancer is now 6 years in and she is only on targeted therapy.     I will contact Dr Julien Nordmann to see if he wants her to see a breast oncologist. Her DCIS is only weakly ER positive, so antihormonal tx may not be as helpful in preventing recurrence.    I will also contact D.r Tammi Klippel regarding whether or not he would give her radiation again.  Her lung and supraclavicular node received stereotactic XRT, so chest wall and breast likely did not receive large doses.  However, she does have at this point a small presumed malignant pleural effusion, so they may elect not to do additional treatment.   I think surgery would be well tolerated and would decrease likelihood of recurrence.     The surgical procedure was described to the patient.  I discussed the incision type and location and that we would need radiology involved on with a wire or seed marker and/or sentinel node.       The risks and benefits of the procedure were described to the patient and she wishes to proceed.     We discussed the risks bleeding, infection, damage to other structures, need for further procedures/surgeries.  We discussed the risk of seroma.  The patient was advised if the area in the breast in cancer, we may need to go back to surgery for additional tissue to obtain negative margins or for a lymph node biopsy. The patient was advised that these are the most common complications, but that others can occur as well.  They were advised against taking aspirin or other anti-inflammatory agents/blood thinners the week before surgery.

## 2021-07-28 ENCOUNTER — Ambulatory Visit (HOSPITAL_COMMUNITY): Payer: Medicare HMO | Admitting: Vascular Surgery

## 2021-07-28 ENCOUNTER — Other Ambulatory Visit: Payer: Self-pay

## 2021-07-28 ENCOUNTER — Ambulatory Visit
Admission: RE | Admit: 2021-07-28 | Discharge: 2021-07-28 | Disposition: A | Discharge status: Home / Self Care | Payer: Medicare HMO | Source: Ambulatory Visit | Attending: General Surgery | Admitting: General Surgery

## 2021-07-28 ENCOUNTER — Encounter (HOSPITAL_COMMUNITY): Payer: Self-pay | Admitting: General Surgery

## 2021-07-28 ENCOUNTER — Ambulatory Visit (HOSPITAL_COMMUNITY): Payer: Medicare HMO | Admitting: Anesthesiology

## 2021-07-28 ENCOUNTER — Encounter (HOSPITAL_COMMUNITY)
Admission: RE | Disposition: A | Discharge status: Home / Self Care | Payer: Self-pay | Source: Ambulatory Visit | Attending: General Surgery

## 2021-07-28 ENCOUNTER — Ambulatory Visit (HOSPITAL_COMMUNITY)
Admission: RE | Admit: 2021-07-28 | Discharge: 2021-07-28 | Disposition: A | Discharge status: Home / Self Care | Payer: Medicare HMO | Source: Ambulatory Visit | Attending: General Surgery | Admitting: General Surgery

## 2021-07-28 DIAGNOSIS — N641 Fat necrosis of breast: Secondary | ICD-10-CM | POA: Diagnosis not present

## 2021-07-28 DIAGNOSIS — Z170 Estrogen receptor positive status [ER+]: Secondary | ICD-10-CM

## 2021-07-28 DIAGNOSIS — Z87891 Personal history of nicotine dependence: Secondary | ICD-10-CM | POA: Insufficient documentation

## 2021-07-28 DIAGNOSIS — E782 Mixed hyperlipidemia: Secondary | ICD-10-CM | POA: Diagnosis not present

## 2021-07-28 DIAGNOSIS — R928 Other abnormal and inconclusive findings on diagnostic imaging of breast: Secondary | ICD-10-CM | POA: Diagnosis not present

## 2021-07-28 DIAGNOSIS — Z85118 Personal history of other malignant neoplasm of bronchus and lung: Secondary | ICD-10-CM | POA: Diagnosis not present

## 2021-07-28 DIAGNOSIS — N6031 Fibrosclerosis of right breast: Secondary | ICD-10-CM | POA: Diagnosis not present

## 2021-07-28 DIAGNOSIS — C50411 Malignant neoplasm of upper-outer quadrant of right female breast: Secondary | ICD-10-CM | POA: Insufficient documentation

## 2021-07-28 DIAGNOSIS — C50911 Malignant neoplasm of unspecified site of right female breast: Secondary | ICD-10-CM | POA: Diagnosis not present

## 2021-07-28 DIAGNOSIS — R69 Illness, unspecified: Secondary | ICD-10-CM | POA: Diagnosis not present

## 2021-07-28 DIAGNOSIS — D0511 Intraductal carcinoma in situ of right breast: Secondary | ICD-10-CM | POA: Diagnosis not present

## 2021-07-28 DIAGNOSIS — I2510 Atherosclerotic heart disease of native coronary artery without angina pectoris: Secondary | ICD-10-CM | POA: Diagnosis not present

## 2021-07-28 DIAGNOSIS — R921 Mammographic calcification found on diagnostic imaging of breast: Secondary | ICD-10-CM | POA: Diagnosis not present

## 2021-07-28 HISTORY — PX: BREAST LUMPECTOMY WITH RADIOACTIVE SEED LOCALIZATION: SHX6424

## 2021-07-28 SURGERY — BREAST LUMPECTOMY WITH RADIOACTIVE SEED LOCALIZATION
Anesthesia: General | Site: Breast | Laterality: Right

## 2021-07-28 MED ORDER — BUPIVACAINE-EPINEPHRINE (PF) 0.25% -1:200000 IJ SOLN
INTRAMUSCULAR | Status: AC
  Filled 2021-07-28: qty 30

## 2021-07-28 MED ORDER — LIDOCAINE HCL 1 % IJ SOLN
INTRAMUSCULAR | Status: DC | PRN
  Administered 2021-07-28: 40 mL via INTRAMUSCULAR

## 2021-07-28 MED ORDER — PHENYLEPHRINE 40 MCG/ML (10ML) SYRINGE FOR IV PUSH (FOR BLOOD PRESSURE SUPPORT)
PREFILLED_SYRINGE | INTRAVENOUS | Status: DC | PRN
  Administered 2021-07-28: 160 ug via INTRAVENOUS
  Administered 2021-07-28: 120 ug via INTRAVENOUS

## 2021-07-28 MED ORDER — MIDAZOLAM HCL 2 MG/2ML IJ SOLN
INTRAMUSCULAR | Status: AC
  Filled 2021-07-28: qty 2

## 2021-07-28 MED ORDER — CHLORHEXIDINE GLUCONATE 0.12 % MT SOLN
15.0000 mL | Freq: Once | OROMUCOSAL | Status: AC
  Administered 2021-07-28: 15 mL via OROMUCOSAL
  Filled 2021-07-28: qty 15

## 2021-07-28 MED ORDER — ACETAMINOPHEN 500 MG PO TABS
1000.0000 mg | ORAL_TABLET | ORAL | Status: AC
  Administered 2021-07-28: 1000 mg via ORAL
  Filled 2021-07-28: qty 2

## 2021-07-28 MED ORDER — 0.9 % SODIUM CHLORIDE (POUR BTL) OPTIME
TOPICAL | Status: DC | PRN
  Administered 2021-07-28: 1000 mL

## 2021-07-28 MED ORDER — CHLORHEXIDINE GLUCONATE CLOTH 2 % EX PADS: 6.0000 | MEDICATED_PAD | Freq: Once | CUTANEOUS | Status: DC

## 2021-07-28 MED ORDER — FENTANYL CITRATE (PF) 100 MCG/2ML IJ SOLN: 25.0000 ug | INTRAMUSCULAR | Status: DC | PRN

## 2021-07-28 MED ORDER — FENTANYL CITRATE (PF) 250 MCG/5ML IJ SOLN
INTRAMUSCULAR | Status: DC | PRN
  Administered 2021-07-28: 50 ug via INTRAVENOUS

## 2021-07-28 MED ORDER — LACTATED RINGERS IV SOLN: INTRAVENOUS | Status: DC

## 2021-07-28 MED ORDER — DEXAMETHASONE SODIUM PHOSPHATE 10 MG/ML IJ SOLN
INTRAMUSCULAR | Status: AC
  Filled 2021-07-28: qty 1

## 2021-07-28 MED ORDER — SUCCINYLCHOLINE CHLORIDE 200 MG/10ML IV SOSY
PREFILLED_SYRINGE | INTRAVENOUS | Status: AC
  Filled 2021-07-28: qty 10

## 2021-07-28 MED ORDER — MEPERIDINE HCL 25 MG/ML IJ SOLN: 6.2500 mg | INTRAMUSCULAR | Status: DC | PRN

## 2021-07-28 MED ORDER — ACETAMINOPHEN 160 MG/5ML PO SOLN: 325.0000 mg | ORAL | Status: DC | PRN

## 2021-07-28 MED ORDER — LIDOCAINE 2% (20 MG/ML) 5 ML SYRINGE
INTRAMUSCULAR | Status: DC | PRN
  Administered 2021-07-28: 100 mg via INTRAVENOUS

## 2021-07-28 MED ORDER — LIDOCAINE HCL (PF) 1 % IJ SOLN
INTRAMUSCULAR | Status: AC
  Filled 2021-07-28: qty 30

## 2021-07-28 MED ORDER — PROPOFOL 10 MG/ML IV BOLUS
INTRAVENOUS | Status: AC
  Filled 2021-07-28: qty 20

## 2021-07-28 MED ORDER — CEFAZOLIN SODIUM-DEXTROSE 2-4 GM/100ML-% IV SOLN
2.0000 g | INTRAVENOUS | Status: AC
  Administered 2021-07-28: 2 g via INTRAVENOUS
  Filled 2021-07-28: qty 100

## 2021-07-28 MED ORDER — ACETAMINOPHEN 325 MG PO TABS: 325.0000 mg | ORAL_TABLET | ORAL | Status: DC | PRN

## 2021-07-28 MED ORDER — ONDANSETRON HCL 4 MG/2ML IJ SOLN: 4.0000 mg | Freq: Once | INTRAMUSCULAR | Status: DC | PRN

## 2021-07-28 MED ORDER — PROPOFOL 10 MG/ML IV BOLUS
INTRAVENOUS | Status: DC | PRN
Start: 2021-07-28 — End: 2021-07-28
  Administered 2021-07-28: 100 mg via INTRAVENOUS

## 2021-07-28 MED ORDER — ENSURE PRE-SURGERY PO LIQD: 296.0000 mL | Freq: Once | ORAL | Status: DC

## 2021-07-28 MED ORDER — OXYCODONE HCL 5 MG/5ML PO SOLN: 5.0000 mg | Freq: Once | ORAL | Status: DC | PRN

## 2021-07-28 MED ORDER — FENTANYL CITRATE (PF) 250 MCG/5ML IJ SOLN
INTRAMUSCULAR | Status: AC
  Filled 2021-07-28: qty 5

## 2021-07-28 MED ORDER — ONDANSETRON HCL 4 MG/2ML IJ SOLN
INTRAMUSCULAR | Status: AC
  Filled 2021-07-28: qty 2

## 2021-07-28 MED ORDER — OXYCODONE HCL 5 MG PO TABS: 5.0000 mg | ORAL_TABLET | Freq: Once | ORAL | Status: DC | PRN

## 2021-07-28 MED ORDER — PHENYLEPHRINE HCL-NACL 20-0.9 MG/250ML-% IV SOLN
INTRAVENOUS | Status: DC | PRN
Start: 2021-07-28 — End: 2021-07-28
  Administered 2021-07-28: 50 ug/min via INTRAVENOUS

## 2021-07-28 MED ORDER — MIDAZOLAM HCL 2 MG/2ML IJ SOLN
INTRAMUSCULAR | Status: DC | PRN
Start: 2021-07-28 — End: 2021-07-28
  Administered 2021-07-28: 2 mg via INTRAVENOUS

## 2021-07-28 MED ORDER — DEXAMETHASONE SODIUM PHOSPHATE 10 MG/ML IJ SOLN
INTRAMUSCULAR | Status: DC | PRN
  Administered 2021-07-28: 10 mg via INTRAVENOUS

## 2021-07-28 MED ORDER — ORAL CARE MOUTH RINSE: 15.0000 mL | Freq: Once | OROMUCOSAL | Status: AC

## 2021-07-28 MED ORDER — EPHEDRINE SULFATE-NACL 50-0.9 MG/10ML-% IV SOSY
PREFILLED_SYRINGE | INTRAVENOUS | Status: DC | PRN
  Administered 2021-07-28: 15 mg via INTRAVENOUS

## 2021-07-28 MED ORDER — LIDOCAINE 2% (20 MG/ML) 5 ML SYRINGE
INTRAMUSCULAR | Status: AC
  Filled 2021-07-28: qty 5

## 2021-07-28 MED ORDER — ONDANSETRON HCL 4 MG/2ML IJ SOLN
INTRAMUSCULAR | Status: DC | PRN
  Administered 2021-07-28: 4 mg via INTRAVENOUS

## 2021-07-28 MED ORDER — OXYCODONE HCL 5 MG PO TABS: 2.5000 mg | ORAL_TABLET | Freq: Four times a day (QID) | ORAL | 0 refills | Status: DC | PRN

## 2021-07-28 SURGICAL SUPPLY — 44 items
BAG COUNTER SPONGE SURGICOUNT (BAG) ×2 IMPLANT
BINDER BREAST XLRG (GAUZE/BANDAGES/DRESSINGS) ×1 IMPLANT
BLADE SURG 10 STRL SS (BLADE) ×2 IMPLANT
CANISTER SUCT 3000ML PPV (MISCELLANEOUS) ×1 IMPLANT
CHLORAPREP W/TINT 26 (MISCELLANEOUS) ×2 IMPLANT
CLIP VESOCCLUDE LG 6/CT (CLIP) ×2 IMPLANT
CLSR STERI-STRIP ANTIMIC 1/2X4 (GAUZE/BANDAGES/DRESSINGS) ×1 IMPLANT
COVER PROBE W GEL 5X96 (DRAPES) ×2 IMPLANT
COVER SURGICAL LIGHT HANDLE (MISCELLANEOUS) ×2 IMPLANT
DERMABOND ADHESIVE PROPEN (GAUZE/BANDAGES/DRESSINGS)
DERMABOND ADVANCED (GAUZE/BANDAGES/DRESSINGS) ×1
DERMABOND ADVANCED .7 DNX12 (GAUZE/BANDAGES/DRESSINGS) ×1 IMPLANT
DERMABOND ADVANCED .7 DNX6 (GAUZE/BANDAGES/DRESSINGS) IMPLANT
DEVICE DUBIN SPECIMEN MAMMOGRA (MISCELLANEOUS) ×2 IMPLANT
DRAPE CHEST BREAST 15X10 FENES (DRAPES) ×2 IMPLANT
DRSG PAD ABDOMINAL 8X10 ST (GAUZE/BANDAGES/DRESSINGS) ×2 IMPLANT
ELECT COATED BLADE 2.86 ST (ELECTRODE) ×2 IMPLANT
ELECT REM PT RETURN 9FT ADLT (ELECTROSURGICAL) ×2
ELECTRODE REM PT RTRN 9FT ADLT (ELECTROSURGICAL) ×1 IMPLANT
GAUZE SPONGE 4X4 12PLY STRL (GAUZE/BANDAGES/DRESSINGS) IMPLANT
GAUZE SPONGE 4X4 12PLY STRL LF (GAUZE/BANDAGES/DRESSINGS) ×2 IMPLANT
GLOVE SURG ENC MOIS LTX SZ6 (GLOVE) ×2 IMPLANT
GLOVE SURG UNDER LTX SZ6.5 (GLOVE) ×2 IMPLANT
GOWN STRL REUS W/ TWL LRG LVL3 (GOWN DISPOSABLE) ×1 IMPLANT
GOWN STRL REUS W/TWL 2XL LVL3 (GOWN DISPOSABLE) ×2 IMPLANT
GOWN STRL REUS W/TWL LRG LVL3 (GOWN DISPOSABLE) ×1
KIT BASIN OR (CUSTOM PROCEDURE TRAY) ×2 IMPLANT
KIT MARKER MARGIN INK (KITS) ×2 IMPLANT
LIGHT WAVEGUIDE WIDE FLAT (MISCELLANEOUS) IMPLANT
NDL HYPO 25GX1X1/2 BEV (NEEDLE) ×1 IMPLANT
NEEDLE HYPO 25GX1X1/2 BEV (NEEDLE) ×2 IMPLANT
NS IRRIG 1000ML POUR BTL (IV SOLUTION) ×1 IMPLANT
PACK GENERAL/GYN (CUSTOM PROCEDURE TRAY) ×2 IMPLANT
PAD ABD 8X10 STRL (GAUZE/BANDAGES/DRESSINGS) ×1 IMPLANT
STRIP CLOSURE SKIN 1/2X4 (GAUZE/BANDAGES/DRESSINGS) ×2 IMPLANT
SUT MNCRL AB 4-0 PS2 18 (SUTURE) ×2 IMPLANT
SUT SILK 2 0 SH (SUTURE) IMPLANT
SUT VIC AB 2-0 SH 27 (SUTURE) ×1
SUT VIC AB 2-0 SH 27XBRD (SUTURE) ×1 IMPLANT
SUT VIC AB 3-0 SH 27 (SUTURE) ×1
SUT VIC AB 3-0 SH 27X BRD (SUTURE) ×1 IMPLANT
SYR CONTROL 10ML LL (SYRINGE) ×2 IMPLANT
TOWEL GREEN STERILE (TOWEL DISPOSABLE) ×2 IMPLANT
TOWEL GREEN STERILE FF (TOWEL DISPOSABLE) ×2 IMPLANT

## 2021-07-28 NOTE — Anesthesia Postprocedure Evaluation (Signed)
Anesthesia Post Note  Patient: Rose Figueroa  Procedure(s) Performed: RIGHT BREAST LUMPECTOMY WITH RADIOACTIVE SEED LOCALIZATION X2 (Right: Breast)     Patient location during evaluation: PACU Anesthesia Type: General Level of consciousness: awake and alert Pain management: pain level controlled Vital Signs Assessment: post-procedure vital signs reviewed and stable Respiratory status: spontaneous breathing, nonlabored ventilation, respiratory function stable and patient connected to nasal cannula oxygen Cardiovascular status: blood pressure returned to baseline and stable Postop Assessment: no apparent nausea or vomiting Anesthetic complications: no   No notable events documented.  Last Vitals:  Vitals:   07/28/21 1016 07/28/21 1031  BP: 120/83 115/79  Pulse: 88 86  Resp: 19 15  Temp: (!) 36.2 C 36.4 C  SpO2: 100% 93%    Last Pain:  Vitals:   07/28/21 1031  TempSrc:   PainSc: 0-No pain                 Arvon Schreiner

## 2021-07-28 NOTE — Transfer of Care (Signed)
Immediate Anesthesia Transfer of Care Note  Patient: Rose Figueroa  Procedure(s) Performed: RIGHT BREAST LUMPECTOMY WITH RADIOACTIVE SEED LOCALIZATION X2 (Right: Breast)  Patient Location: PACU  Anesthesia Type:General  Level of Consciousness: drowsy and patient cooperative  Airway & Oxygen Therapy: Patient Spontanous Breathing  Post-op Assessment: Report given to RN and Post -op Vital signs reviewed and stable  Post vital signs: Reviewed and stable  Last Vitals:  Vitals Value Taken Time  BP 120/83 07/28/21 1016  Temp    Pulse 92 07/28/21 1017  Resp 34 07/28/21 1017  SpO2 98 % 07/28/21 1017  Vitals shown include unvalidated device data.  Last Pain:  Vitals:   07/28/21 0746  TempSrc: Oral  PainSc:          Complications: No notable events documented.

## 2021-07-28 NOTE — Discharge Instructions (Addendum)
[facility] Office Phone Number [phone]  BREAST BIOPSY/ PARTIAL MASTECTOMY: POST OP INSTRUCTIONS  Always review your discharge instruction sheet given to you by the facility where your surgery was performed.  IF YOU HAVE DISABILITY OR FAMILY LEAVE FORMS, YOU MUST BRING THEM TO THE OFFICE FOR PROCESSING.  DO NOT GIVE THEM TO YOUR DOCTOR.  A prescription for pain medication may be given to you upon discharge.  Take your pain medication as prescribed, if needed.  If narcotic pain medicine is not needed, then you may take acetaminophen (Tylenol) or ibuprofen (Advil) as needed. Take your usually prescribed medications unless otherwise directed If you need a refill on your pain medication, please contact your pharmacy.  They will contact our office to request authorization.  Prescriptions will not be filled after 5pm or on week-ends. You should eat very light the first 24 hours after surgery, such as soup, crackers, pudding, etc.  Resume your normal diet the day after surgery. Most patients will experience some swelling and bruising in the breast.  Ice packs and a good support bra will help.  Swelling and bruising can take several days to resolve.  It is common to experience some constipation if taking pain medication after surgery.  Increasing fluid intake and taking a stool softener will usually help or prevent this problem from occurring.  A mild laxative (Milk of Magnesia or Miralax) should be taken according to package directions if there are no bowel movements after 48 hours. Unless discharge instructions indicate otherwise, you may remove your bandages 48 hours after surgery, and you may shower at that time.  You may have steri-strips (small skin tapes) in place directly over the incision.  These strips should be left on the skin for 7-10 days.   Any sutures or staples will be removed at the office during your follow-up visit. ACTIVITIES:  You may resume regular daily activities  (gradually increasing) beginning the next day.  Wearing a good support bra or sports bra (or the breast binder) minimizes pain and swelling.  You may have sexual intercourse when it is comfortable. You may drive when you no longer are taking prescription pain medication, you can comfortably wear a seatbelt, and you can safely maneuver your car and apply brakes. RETURN TO WORK:  __________1 week_______________ You should see your doctor in the office for a follow-up appointment approximately two weeks after your surgery.  Your doctor's nurse will typically make your follow-up appointment when she calls you with your pathology report.  Expect your pathology report 2-3 business days after your surgery.  You may call to check if you do not hear from us after three days.   WHEN TO CALL YOUR DOCTOR: Fever over 101.0 Nausea and/or vomiting. Extreme swelling or bruising. Continued bleeding from incision. Increased pain, redness, or drainage from the incision.  The clinic staff is available to answer your questions during regular business hours.  Please don't hesitate to call and ask to speak to one of the nurses for clinical concerns.  If you have a medical emergency, go to the nearest emergency room or call 911.  A surgeon from [facility] is always on call at the hospital.  For further questions, please visit [email or web address]   

## 2021-07-28 NOTE — Interval H&P Note (Signed)
History and Physical Interval Note:  07/28/2021 8:52 AM  Rose Figueroa  has presented today for surgery, with the diagnosis of RIGHT BREAST CANCER.  The various methods of treatment have been discussed with the patient and family. After consideration of risks, benefits and other options for treatment, the patient has consented to  Procedure(s) with comments: RIGHT BREAST LUMPECTOMY WITH RADIOACTIVE SEED LOCALIZATION X2 (Right) - 60 MINUTES ROOM 2 as a surgical intervention.  The patient's history has been reviewed, patient examined, no change in status, stable for surgery.  I have reviewed the patient's chart and labs.  Questions were answered to the patient's satisfaction.     Stark Klein

## 2021-07-28 NOTE — Op Note (Signed)
Right Breast Radioactive seed bracketed lumpectomy ° °Indications: This patient presents with history of right breast cancer, high grade DCIS with necrosis and calcifications,  cTis, upper outer quadrant, receptors +/- ° °Pre-operative Diagnosis: right breast cancer ° °Post-operative Diagnosis: Same ° °Surgeon: BYERLY,FAERA  ° °Anesthesia: General endotracheal anesthesia ° °ASA Class: 3 ° °Procedure Details  °The patient was seen in the Holding Room. The risks, benefits, complications, treatment options, and expected outcomes were discussed with the patient. The possibilities of bleeding, infection, the need for additional procedures, failure to diagnose a condition, and creating a complication requiring other procedures or operations were discussed with the patient. The patient concurred with the proposed plan, giving informed consent.  The site of surgery properly noted/marked. The patient was taken to Operating Room # 2, identified, and the procedure verified as right breast seed bracketed lumpectomy. ° °The right breast and chest were prepped and draped in standard fashion. A curvilinear superolateral incision was made near the previously placed radioactive seeds in a prior scar.  Dissection was carried down around the point of maximum signal intensity. The cautery was used to perform the dissection.   The specimen was inked with the margin marker paint kit.    Specimen radiography confirmed inclusion of the mammographic lesion, the clips, and the seeds.  The background signal in the breast was zero.  Hemostasis was achieved with cautery.  The cavity was marked with clips on each border other than the anterior border.  The wound was irrigated and closed with 3-0 vicryl interrupted deep dermal sutures and 4-0 monocryl running subcuticular suture.   °   °Sterile dressings were applied. At the end of the operation, all sponge, instrument, and needle counts were correct. °  °Findings: °Seeds, clips in specimen.   Posterior margin is pectoralis. °  °Estimated Blood Loss:  min °        °Specimens: left breast tissue with seed °        °Complications:  None; patient tolerated the procedure well. °        °Disposition: PACU - hemodynamically stable. °        °Condition: stable °  ° °

## 2021-07-28 NOTE — Anesthesia Procedure Notes (Signed)
Procedure Name: LMA Insertion Date/Time: 07/28/2021 9:16 AM Performed by: Lance Coon, CRNA Pre-anesthesia Checklist: Patient identified, Emergency Drugs available, Suction available, Patient being monitored and Timeout performed Patient Re-evaluated:Patient Re-evaluated prior to induction Oxygen Delivery Method: Circle system utilized Preoxygenation: Pre-oxygenation with 100% oxygen Induction Type: IV induction LMA: LMA inserted LMA Size: 3.0 Number of attempts: 1 Placement Confirmation: breath sounds checked- equal and bilateral and positive ETCO2 Tube secured with: Tape Dental Injury: Teeth and Oropharynx as per pre-operative assessment

## 2021-07-29 ENCOUNTER — Encounter (HOSPITAL_COMMUNITY): Payer: Self-pay | Admitting: General Surgery

## 2021-07-29 LAB — SURGICAL PATHOLOGY

## 2021-07-30 ENCOUNTER — Telehealth: Payer: Self-pay

## 2021-07-30 DIAGNOSIS — J3489 Other specified disorders of nose and nasal sinuses: Secondary | ICD-10-CM | POA: Diagnosis not present

## 2021-07-30 DIAGNOSIS — R053 Chronic cough: Secondary | ICD-10-CM | POA: Diagnosis not present

## 2021-07-30 NOTE — Telephone Encounter (Signed)
Pt LM advising no one has contacted her regarding scheduling her CT scan. Pt also states she has a cough and it was suggested to her by a family member to try/take Tessalon and she would like a rx for this.  I have reviewed the pts last progress note dated 07/15/21 and have have called the pt back. I have advised her that Dr. Julien Nordmann did not order a CT for this visit but is likely going to order one after her next visit.   With regard to her request for the Tessalon rx, I have confirmed with the pt that her cough is not new but ongoing. She states it has worsened a bit since starting her new tx. She describes it as non productive with the occasional clear sputum. Pt denies any increased or change in SOBE, fever and CP. I have confirmed that she has not tried any OTC cough suppressants. I have advised the pt she can try Robitussin over the weekend to see if it helps but I will also send a request to Dr. Julien Nordmann for a rx. Pt expressed understanding of this information.

## 2021-08-02 ENCOUNTER — Other Ambulatory Visit: Payer: Self-pay | Admitting: Internal Medicine

## 2021-08-02 ENCOUNTER — Ambulatory Visit: Payer: Medicare HMO | Admitting: Internal Medicine

## 2021-08-02 MED ORDER — BENZONATATE 200 MG PO CAPS: 200.0000 mg | ORAL_CAPSULE | Freq: Three times a day (TID) | ORAL | 0 refills | Status: AC | PRN

## 2021-08-03 ENCOUNTER — Encounter: Payer: Self-pay | Admitting: *Deleted

## 2021-08-03 NOTE — Telephone Encounter (Signed)
I spoke with pt and advise the rx was sent. She states she has already picked them up.

## 2021-08-10 DIAGNOSIS — M79672 Pain in left foot: Secondary | ICD-10-CM | POA: Diagnosis not present

## 2021-08-11 ENCOUNTER — Inpatient Hospital Stay: Payer: Medicare HMO

## 2021-08-11 ENCOUNTER — Encounter: Payer: Self-pay | Admitting: Internal Medicine

## 2021-08-11 ENCOUNTER — Inpatient Hospital Stay: Payer: Medicare HMO | Attending: Internal Medicine | Admitting: Internal Medicine

## 2021-08-11 ENCOUNTER — Other Ambulatory Visit: Payer: Self-pay

## 2021-08-11 VITALS — BP 129/87 | HR 96 | Temp 98.6°F | Resp 18 | Ht 63.0 in | Wt 164.9 lb

## 2021-08-11 DIAGNOSIS — Z923 Personal history of irradiation: Secondary | ICD-10-CM | POA: Insufficient documentation

## 2021-08-11 DIAGNOSIS — C3411 Malignant neoplasm of upper lobe, right bronchus or lung: Secondary | ICD-10-CM

## 2021-08-11 DIAGNOSIS — M7989 Other specified soft tissue disorders: Secondary | ICD-10-CM | POA: Diagnosis not present

## 2021-08-11 DIAGNOSIS — D0511 Intraductal carcinoma in situ of right breast: Secondary | ICD-10-CM | POA: Insufficient documentation

## 2021-08-11 DIAGNOSIS — C3412 Malignant neoplasm of upper lobe, left bronchus or lung: Secondary | ICD-10-CM | POA: Insufficient documentation

## 2021-08-11 DIAGNOSIS — C3490 Malignant neoplasm of unspecified part of unspecified bronchus or lung: Secondary | ICD-10-CM | POA: Diagnosis not present

## 2021-08-11 DIAGNOSIS — Z79899 Other long term (current) drug therapy: Secondary | ICD-10-CM | POA: Insufficient documentation

## 2021-08-11 LAB — CBC WITH DIFFERENTIAL (CANCER CENTER ONLY)
Abs Immature Granulocytes: 0.02 10*3/uL (ref 0.00–0.07)
Basophils Absolute: 0 10*3/uL (ref 0.0–0.1)
Basophils Relative: 1 %
Eosinophils Absolute: 0.2 10*3/uL (ref 0.0–0.5)
Eosinophils Relative: 5 %
HCT: 31.6 % — ABNORMAL LOW (ref 36.0–46.0)
Hemoglobin: 10.8 g/dL — ABNORMAL LOW (ref 12.0–15.0)
Immature Granulocytes: 0 %
Lymphocytes Relative: 22 %
Lymphs Abs: 1 10*3/uL (ref 0.7–4.0)
MCH: 31.5 pg (ref 26.0–34.0)
MCHC: 34.2 g/dL (ref 30.0–36.0)
MCV: 92.1 fL (ref 80.0–100.0)
Monocytes Absolute: 0.6 10*3/uL (ref 0.1–1.0)
Monocytes Relative: 12 %
Neutro Abs: 2.8 10*3/uL (ref 1.7–7.7)
Neutrophils Relative %: 60 %
Platelet Count: 326 10*3/uL (ref 150–400)
RBC: 3.43 MIL/uL — ABNORMAL LOW (ref 3.87–5.11)
RDW: 15.4 % (ref 11.5–15.5)
WBC Count: 4.6 10*3/uL (ref 4.0–10.5)
nRBC: 0 % (ref 0.0–0.2)

## 2021-08-11 LAB — CMP (CANCER CENTER ONLY)
ALT: 17 U/L (ref 0–44)
AST: 22 U/L (ref 15–41)
Albumin: 4 g/dL (ref 3.5–5.0)
Alkaline Phosphatase: 60 U/L (ref 38–126)
Anion gap: 3 — ABNORMAL LOW (ref 5–15)
BUN: 20 mg/dL (ref 8–23)
CO2: 34 mmol/L — ABNORMAL HIGH (ref 22–32)
Calcium: 9.2 mg/dL (ref 8.9–10.3)
Chloride: 103 mmol/L (ref 98–111)
Creatinine: 0.92 mg/dL (ref 0.44–1.00)
GFR, Estimated: >60 mL/min (ref >60)
Glucose, Bld: 97 mg/dL (ref 70–99)
Potassium: 4.1 mmol/L (ref 3.5–5.1)
Sodium: 140 mmol/L (ref 135–145)
Total Bilirubin: 0.3 mg/dL (ref 0.3–1.2)
Total Protein: 6.3 g/dL — ABNORMAL LOW (ref 6.5–8.1)

## 2021-08-11 NOTE — Progress Notes (Signed)
[place] [phone]   [phone]  OFFICE PROGRESS NOTE  Lyman Bishop, DO [address] [place] [address]  DIAGNOSIS:  1) Stage IIIB (T2a, N3, M0) non-small cell lung cancer, adenocarcinoma with negative EGFR mutation and negative ALK gene translocation diagnosed in October 2016. 2) right breast ductal carcinoma in situ diagnosed in December 2022.  Biomarker Findings Microsatellite status - MS-Stable Tumor Mutational Burden - TMB-Low (5 Muts/Mb) Genomic Findings For a complete list of the genes assayed, please refer to the Appendix. ROS1 SDC4-ROS1 fusion RBM10 N580f*14 TP53 R273C 7 Disease relevant genes with no reportable alterations: EGFR, KRAS, ALK, BRAF, MET, RET, ERBB2  PRIOR THERAPY:  1) Concurrent chemoradiation with weekly carboplatin for AUC of 2 and paclitaxel 45 MG/M2 status post 6 cycles. Last dose was given 07/13/2015 with partial response. 2) Systemic chemotherapy with carboplatin for AUC of 5 and Alimta 500 MG/M2 every 3 weeks. First dose 11/02/2015. Status post 6 cycles. 3) palliative stereotactic radiotherapy to the left supraclavicular lymph node under the care of Dr. MTammi Klippel  Loss of fraction of radiotherapy scheduled for November 01, 2017 4) palliative radiotherapy to the left supraclavicular lymph node as well as left upper lobe lung mass completed May 04, 2020 under the care of Dr. MTammi Klippel  CURRENT THERAPY: Entrectinib 600 mg p.o. daily.  Started April 23, 2021.  Her dose was reduced to 400 mg p.o. daily 6 weeks ago.  Status post 3 months of treatment  INTERVAL HISTORY: Rose [id number].[id number]. female returns to the clinic today for follow-up visit accompanied by her husband Rose Figueroa  The patient is complaining of swelling of the left leg and foot.  She was seen recently by her primary care physician and had x-ray of the left foot that was unremarkable.  She also has dizzy spells a week ago that  lasted for several hours but improved spontaneously.  She continues to complain of chronic back pain she denied having any current chest pain but has shortness of breath with exertion with no cough or hemoptysis.  She has no current nausea, vomiting, diarrhea or constipation.  She has no headache or visual changes.  She recently underwent lumpectomy for the suspicious breast cancer lesion on July 28, 2021 and the final pathology ([id number]) showed ductal carcinoma in situ, high-grade with necrosis and calcifications spanning a fibrotic area of 2.8 cm.  The resection margins were negative for malignancy but the closest are the posterior margin measuring 0.2 cm and superior margin at 0.4 cm.  The patient is here today for evaluation and recommendation regarding her condition.  MEDICAL HISTORY: Past Medical History:  Diagnosis Date   Age related osteoporosis 12/18/2020   Anxiety    Atherosclerosis 12/18/2020   Back pain    right back lateral   BCC (basal cell carcinoma of skin)    [place] 10/04[place]2017   [place] of [place] of right lung [place] 05/19/2015   Cardiac murmur 04/24/2020   Chest discomfort 04/24/2020   Chronic pain 12/18/2020   Colon polyps    Complication of anesthesia    pt. states that she is difficult to arouse   Coronary atherosclerosis 04/24/2020   Diverticulosis    Dysrhythmia    "skip beat in early 20's"   Encounter for antineoplastic chemotherapy 06/08/2015   Generalized rash 06/24/2015   H/O cold sores    History of anemia    [place]ung cancer ([facility]  RUL mass -being tx- radiation and chemo- remains last chemo 01-04-16   Lung field abnormal 12/18/2020   Metastasis to supraclavicular lymph node ([place]) 10/04/2017   Metastatic non-small cell lung cancer ([facility]) 03/24/2020   Mixed dyslipidemia 04/24/2020   Oral lesion 06/24/2015   Osteoporosis    Personal history of colonic polyps 12/18/2020   PONV (postoperative nausea and vomiting)    Port catheter in place 11/20/2015    Rectal bleeding    right upper lobe lung mass 04/15/2015   malignant tumor found - 10'16-radiation and chemotherapy and remains with chemo at present- Dr. Earlie Server follows.   Sciatica 12/18/2020   Vitamin D deficiency 12/18/2020    ALLERGIES:  is allergic to sod picosulfate-mag ox-cit acd.  MEDICATIONS:  Current Outpatient Medications  Medication Sig Dispense Refill   Acetylcysteine (NAC 600) 600 MG CAPS Take 600 mg by mouth daily.     albuterol (VENTOLIN HFA) 108 (90 Base) MCG/ACT inhaler Inhale 2 puffs into the lungs every 6 (six) hours as needed for wheezing or shortness of breath.     atorvastatin (LIPITOR) 10 MG tablet TAKE 1 TABLET BY MOUTH EVERY DAY (Patient taking differently: Take 5 mg by mouth daily.) 90 tablet 1   benzonatate (TESSALON) 200 MG capsule Take 1 capsule (200 mg total) by mouth 3 (three) times daily as needed for cough. 20 capsule 0   Biotin 10000 MCG TABS Take 10,000 mcg by mouth daily.     Calcium Carb-Cholecalciferol (CALCIUM 1000 + D PO) Take 1 tablet by mouth 2 (two) times daily. 1000 mg /1200 mg     cetirizine (ZYRTEC) 10 MG tablet Take 10 mg by mouth daily.     denosumab (PROLIA) 60 MG/ML SOSY injection Inject 60 mg into the skin every 6 (six) months.     entrectinib (ROZLYTREK) 200 MG capsule Take 3 capsules (600 mg total) by mouth daily. Swallow capsules whole. Do not open, crush, chew, or dissolve the contents. (Patient taking differently: Take 400 mg by mouth daily. Swallow capsules whole. Do not open, crush, chew, or dissolve the contents.) 90 capsule 3   L-Lysine 1000 MG TABS Take 1,000 mg by mouth daily.      magnesium oxide (MAG-OX) 400 MG tablet Take 400 mg by mouth daily.     Melatonin 3 MG CAPS Take 3 mg by mouth at bedtime.     Multiple Vitamins-Minerals (PRESERVISION AREDS) CAPS Take 1 capsule by mouth daily.     niacin 500 MG tablet Take 500 mg by mouth daily.     nitroGLYCERIN (NITROSTAT) 0.4 MG SL tablet Place 0.4 mg under the tongue every 5  (five) minutes as needed for chest pain. (Patient not taking: Reported on 07/15/2021)     Omega-3 Fatty Acids (RA FISH OIL PO) Take 1,200 mg by mouth daily.     oxyCODONE (OXY IR/ROXICODONE) 5 MG immediate release tablet Take 0.5-1 tablets (2.5-5 mg total) by mouth every 6 (six) hours as needed for severe pain. 5 tablet 0   PREBIOTIC PRODUCT PO Take 1 tablet by mouth daily. culterelle     Psyllium (REGULOID PO) Take 750 mg by mouth daily as needed (Constipation).     senna (SENOKOT) 8.6 MG tablet Take 3 tablets by mouth 2 (two) times daily.     Turmeric Curcumin 500 MG CAPS Take 500 mg by mouth daily.     vitamin B-12 (CYANOCOBALAMIN) 1000 MCG tablet Take 1,000 mcg by mouth daily.     No current facility-administered medications for  this visit.    SURGICAL HISTORY:  Past Surgical History:  Procedure Laterality Date   BREAST EXCISIONAL BIOPSY Right    BREAST EXCISIONAL BIOPSY Right    BREAST EXCISIONAL BIOPSY Right    BREAST LUMPECTOMY WITH RADIOACTIVE SEED LOCALIZATION Right 07/28/2021   Procedure: RIGHT BREAST LUMPECTOMY WITH RADIOACTIVE SEED LOCALIZATION X2;  Surgeon: Stark Klein, MD;  Location: [facility];  Service: General;  Laterality: Right;  60 MINUTES ROOM 2   COLONOSCOPY     COLONOSCOPY W/ POLYPECTOMY     COLONOSCOPY WITH PROPOFOL N/A 01/20/2016   Procedure: COLONOSCOPY WITH PROPOFOL;  Surgeon: Arta Silence, MD;  Location: [facility];  Service: Endoscopy;  Laterality: N/A;   CYSTECTOMY     right breast x 2   IR REMOVAL TUN ACCESS W/ PORT W/O FL MOD SED  12/12/2018   MEDIASTINOSCOPY N/A 05/07/2015   Procedure: MEDIASTINOSCOPY;  Surgeon: Ivin Poot, MD;  Location: [facility];  Service: Thoracic;  Laterality: N/A;   TONSILLECTOMY     VIDEO BRONCHOSCOPY WITH ENDOBRONCHIAL ULTRASOUND N/A 05/07/2015   Procedure: VIDEO BRONCHOSCOPY WITH ENDOBRONCHIAL ULTRASOUND;  Surgeon: Ivin Poot, MD;  Location: [facility];  Service: Thoracic;  Laterality: N/A;    REVIEW OF SYSTEMS:   Constitutional: positive for fatigue Eyes: negative Ears, nose, mouth, throat, and face: negative Respiratory: negative Cardiovascular: negative Gastrointestinal: negative Genitourinary:negative Integument/breast: negative Hematologic/lymphatic: negative Musculoskeletal:positive for arthralgias and back pain Neurological: positive for dizziness Behavioral/Psych: negative Endocrine: negative Allergic/Immunologic: negative   PHYSICAL EXAMINATION: General appearance: alert, cooperative, fatigued and no distress Head: Normocephalic, without obvious abnormality, atraumatic Neck: no adenopathy, no JVD, supple, symmetrical, trachea midline and thyroid not enlarged, symmetric, no tenderness/mass/nodules Lymph nodes: Cervical, supraclavicular, and axillary nodes normal. Resp: clear to auscultation bilaterally Back: symmetric, no curvature. ROM normal. No CVA tenderness. Cardio: regular rate and rhythm, S1, S2 normal, no murmur, click, rub or gallop GI: soft, non-tender; bowel sounds normal; no masses,  no organomegaly Extremities: extremities normal, atraumatic, no cyanosis or edema Neurological exam unremarkable  ECOG PERFORMANCE STATUS: 1 - Symptomatic but completely ambulatory  Blood pressure 129/87, pulse 96, temperature 98.6 F (37 C), temperature source Tympanic, resp. rate 18, height $RemoveBe'5\' 3"'eDrDUFRgh$  (1.6 m), weight 164 lb 14.4 oz (74.8 kg), SpO2 95 %.  LABORATORY DATA: Lab Results  Component Value Date   WBC 4.6 08/11/2021   HGB 10.8 (L) 08/11/2021   HCT 31.6 (L) 08/11/2021   MCV 92.1 08/11/2021   PLT 326 08/11/2021      Chemistry      Component Value Date/Time   NA 140 07/15/2021 0923   NA 139 03/20/2017 1038   K 4.2 07/15/2021 0923   K 4.3 03/20/2017 1038   CL 103 07/15/2021 0923   CO2 32 07/15/2021 0923   CO2 27 03/20/2017 1038   BUN 25 (H) 07/15/2021 0923   BUN 15.4 03/20/2017 1038   CREATININE 1.12 (H) 07/15/2021 0923   CREATININE 0.7 03/20/2017 1038      Component  Value Date/Time   CALCIUM 9.5 07/15/2021 0923   CALCIUM 9.8 03/20/2017 1038   ALKPHOS 45 07/15/2021 0923   ALKPHOS 37 (L) 03/20/2017 1038   AST 41 07/15/2021 0923   AST 25 03/20/2017 1038   ALT 49 (H) 07/15/2021 0923   ALT 22 03/20/2017 1038   BILITOT 0.5 07/15/2021 0923   BILITOT 0.42 03/20/2017 1038       RADIOGRAPHIC STUDIES: MM Breast Surgical Specimen  Result Date: 07/28/2021 CLINICAL DATA:  Specimen radiograph status post right  breast lumpectomy. EXAM: SPECIMEN RADIOGRAPH OF THE RIGHT BREAST COMPARISON:  Previous exam(s). FINDINGS: Status post excision of the right breast. The 2 radioactive seeds and 2 biopsy marker clips are present, completely intact, and were marked for pathology. These findings were communicated to the OR at 9:48 a.m. IMPRESSION: Specimen radiograph of the right breast. Electronically Signed   By: Ammie Ferrier M.D.   On: 07/28/2021 09:48  MM RT RADIOACTIVE SEED LOC MAMMO GUIDE  Result Date: 07/27/2021 : CLINICAL DATA:   Patient presents for bracketed seed localization prior to lumpectomy of the RIGHT breast. Recent biopsies show high-grade ductal carcinoma at 2 sites of calcifications. On post biopsy mammogram, the X shaped clip was noted to be 1.0 centimeters inferior to the biopsy site. The coil shaped clip was in the appropriate position. EXAM: MAMMOGRAPHIC GUIDED RADIOACTIVE SEED LOCALIZATION OF THE RIGHT BREAST COMPARISON:   Previous exam(s). FINDINGS: Patient presents for radioactive seed localization prior to lumpectomy. I met with the patient and we discussed the procedure of seed localization including benefits and alternatives. We discussed the high likelihood of a successful procedure. We discussed the risks of the procedure including infection, bleeding, tissue injury and further surgery. We discussed the low dose of radioactivity involved in the procedure. Informed, written consent was given. The usual time-out protocol was performed immediately prior  to the procedure. Site1: Using mammographic guidance, sterile technique, 1% lidocaine and an I-125 radioactive seed, the coil shaped clip was localized using a craniocaudal approach. The follow-up mammogram images confirm the seed in the expected location and were marked for Dr. Barry Dienes. Follow-up survey of the patient confirms presence of the radioactive seed. Order number of I-125 seed:  [id number]. Total activity:   8.366 millicuries reference Date: 04/30/2021 Site 2: Using mammographic guidance, sterile technique, 1% lidocaine and an I-125 radioactive seed, the residual calcifications 1.0 centimeters superior to the X shaped clip were localized using a craniocaudal approach. The follow-up mammogram images confirm the seed in the expected location and were marked for Dr. Barry Dienes. Follow-up survey of the patient confirms presence of the radioactive seed. Order number of I-125 seed:  [id number]. Total activity:   0.354 millicuries reference Date: 04/30/2021 The patient tolerated the procedure well and was released from the [facility]. She was given instructions regarding seed removal. IMPRESSION: Bracketed radioactive seed localization of the RIGHT breast. No apparent complications. Electronically Signed By: Nolon Nations M.D. On: 07/27/2021 14:38 Electronically Signed   By: Nolon Nations M.D.   On: 07/27/2021 14:49  MM RT RADIO SEED EA ADD LESION LOC MAMMO  Result Date: 07/27/2021 CLINICAL DATA:  Patient presents for bracketed seed localization prior to lumpectomy of the RIGHT breast. Recent biopsies show high-grade ductal carcinoma at 2 sites of calcifications. On post biopsy mammogram, the X shaped clip was noted to be 1.0 centimeters inferior to the biopsy site. The coil shaped clip was in the appropriate position. EXAM: MAMMOGRAPHIC GUIDED RADIOACTIVE SEED LOCALIZATION OF THE RIGHT BREAST COMPARISON:  Previous exam(s). FINDINGS: Patient presents for radioactive seed localization prior to lumpectomy. I  met with the patient and we discussed the procedure of seed localization including benefits and alternatives. We discussed the high likelihood of a successful procedure. We discussed the risks of the procedure including infection, bleeding, tissue injury and further surgery. We discussed the low dose of radioactivity involved in the procedure. Informed, written consent was given. The usual time-out protocol was performed immediately prior to the procedure. Site1: Using mammographic guidance, sterile technique, 1%  lidocaine and an I-125 radioactive seed, the coil shaped clip was localized using a craniocaudal approach. The follow-up mammogram images confirm the seed in the expected location and were marked for Dr. Barry Dienes. Follow-up survey of the patient confirms presence of the radioactive seed. Order number of I-125 seed:  [id number]. Total activity:  3.235 millicuries reference Date: 04/30/2021 Site 2: Using mammographic guidance, sterile technique, 1% lidocaine and an I-125 radioactive seed, the residual calcifications 1.0 centimeters superior to the X shaped clip were localized using a craniocaudal approach. The follow-up mammogram images confirm the seed in the expected location and were marked for Dr. Barry Dienes. Follow-up survey of the patient confirms presence of the radioactive seed. Order number of I-125 seed:  [id number]. Total activity:  2.706 millicuries reference Date: 04/30/2021 The patient tolerated the procedure well and was released from the [facility]. She was given instructions regarding seed removal. IMPRESSION: Bracketed radioactive seed localization of the RIGHT breast. No apparent complications. Electronically Signed   By: Nolon Nations M.D.   On: 07/27/2021 14:38   ASSESSMENT AND PLAN:  This is a very pleasant 66 years old white female with metastatic non-small cell lung cancer initially diagnosed as stage IIIB non-small cell lung cancer, adenocarcinoma with negative EGFR, ALK mutations  diagnosed in October 2016.  Molecular studies showed positive ROS 1 She is status post course of concurrent chemoradiation followed by consolidation chemotherapy with carboplatin and Alimta. The patient has been in observation for close to 3 years. She underwent palliative radiotherapy to the left supraclavicular lymph nodes with improvement of her disease. She also underwent SBRT to left upper lobe lung nodule as well as left supraclavicular lymphadenopathy under the care of Dr. Tammi Klippel completed in October 2021. She was found on recent CT scan of the chest to have evidence for disease recurrence in addition to malignant right pleural effusion. The patient started treatment with Entrectinib 600 mg p.o. daily and she has a lot of trouble with the treatment including fatigue, paresthesia, feeling over the most of the time and imbalance of her gait.  Her dose of Entrectinib was reduced to 400 mg p.o. daily several weeks ago and she has been tolerating the lower dose much better.  The patient continues to tolerate her treatment with Entrectinib fairly well. I recommended for her to continue her current treatment with Entrectinib with the same dose 400 mg p.o. daily. I will see her back for follow-up visit in around 5 weeks with repeat CT scan of the chest, abdomen and pelvis for restaging of her disease. Regarding the swelling of the lower extremities, I will order Doppler of the left lower extremity to rule out deep venous thrombosis. For the recently diagnosed ductal carcinoma in situ, the patient underwent surgical resection.  She has close margin of resection and I am not sure if the patient would require any additional radiotherapy to that area or close monitoring.  I will also discussed with Dr. Lindi Adie, our breast cancer specialist whether the patient will need any additional hormonal therapy at this point or just observation. I will see her back for follow-up visit with the repeat scan. She was  advised to call immediately if she has any other concerning symptoms in the interval.  The patient voices understanding of current disease status and treatment options and is in agreement with the current care plan. All questions were answered. The patient knows to call the clinic with any problems, questions or concerns. We can certainly see the patient much sooner  if necessary.  Disclaimer: This note was dictated with voice recognition software. Similar sounding words can inadvertently be transcribed and may not be corrected upon review.

## 2021-08-13 ENCOUNTER — Ambulatory Visit (HOSPITAL_COMMUNITY)
Admission: RE | Admit: 2021-08-13 | Discharge: 2021-08-13 | Disposition: A | Discharge status: Home / Self Care | Payer: Medicare HMO | Source: Ambulatory Visit | Attending: Physician Assistant | Admitting: Physician Assistant

## 2021-08-13 ENCOUNTER — Other Ambulatory Visit: Payer: Self-pay | Admitting: Physician Assistant

## 2021-08-13 ENCOUNTER — Other Ambulatory Visit: Payer: Self-pay

## 2021-08-13 ENCOUNTER — Telehealth: Payer: Self-pay | Admitting: Medical Oncology

## 2021-08-13 DIAGNOSIS — M79605 Pain in left leg: Secondary | ICD-10-CM

## 2021-08-13 DIAGNOSIS — M7989 Other specified soft tissue disorders: Secondary | ICD-10-CM | POA: Diagnosis not present

## 2021-08-13 DIAGNOSIS — R2242 Localized swelling, mass and lump, left lower limb: Secondary | ICD-10-CM

## 2021-08-13 MED ORDER — FUROSEMIDE 20 MG PO TABS: 20.0000 mg | ORAL_TABLET | Freq: Every day | ORAL | 0 refills | Status: DC

## 2021-08-13 NOTE — Progress Notes (Signed)
Left lower extremity venous duplex has been completed. Preliminary results can be found in CV Proc through chart review.  Results were given to Ansyi at Dr. Worthy Flank office.  08/13/21 2:21 PM Rose Figueroa RVT

## 2021-08-13 NOTE — Telephone Encounter (Signed)
Pain and swelling of left lower extremity. Asking if she should take a " fluid pill".  She has not been scheduled for her venous US per Dr Julien Nordmann.   Ordered changed to stat per Cassie and LVM at cardiovascular for appointment today [phone]. I told her to get the [place] results and then Julien Nordmann will advise re "Fluid Pill"

## 2021-08-13 NOTE — Addendum Note (Signed)
Addended by: Ardeen Garland on: 08/13/2021 12:38 PM   Modules accepted: Orders

## 2021-08-16 ENCOUNTER — Encounter: Payer: Self-pay | Admitting: Internal Medicine

## 2021-08-16 ENCOUNTER — Encounter: Payer: Self-pay | Admitting: *Deleted

## 2021-08-17 ENCOUNTER — Ambulatory Visit: Payer: Medicare HMO

## 2021-08-17 ENCOUNTER — Ambulatory Visit: Payer: Medicare HMO | Admitting: Radiation Oncology

## 2021-08-31 DIAGNOSIS — M79672 Pain in left foot: Secondary | ICD-10-CM | POA: Diagnosis not present

## 2021-09-14 ENCOUNTER — Other Ambulatory Visit: Payer: Self-pay

## 2021-09-14 ENCOUNTER — Inpatient Hospital Stay: Payer: Medicare HMO | Attending: Internal Medicine

## 2021-09-14 ENCOUNTER — Ambulatory Visit (HOSPITAL_COMMUNITY)
Admission: RE | Admit: 2021-09-14 | Discharge: 2021-09-14 | Disposition: A | Discharge status: Home / Self Care | Payer: Medicare HMO | Source: Ambulatory Visit | Attending: Internal Medicine | Admitting: Internal Medicine

## 2021-09-14 ENCOUNTER — Encounter (HOSPITAL_COMMUNITY): Payer: Self-pay

## 2021-09-14 DIAGNOSIS — J90 Pleural effusion, not elsewhere classified: Secondary | ICD-10-CM | POA: Diagnosis not present

## 2021-09-14 DIAGNOSIS — C3490 Malignant neoplasm of unspecified part of unspecified bronchus or lung: Secondary | ICD-10-CM

## 2021-09-14 DIAGNOSIS — C7951 Secondary malignant neoplasm of bone: Secondary | ICD-10-CM | POA: Diagnosis not present

## 2021-09-14 DIAGNOSIS — K7689 Other specified diseases of liver: Secondary | ICD-10-CM | POA: Diagnosis not present

## 2021-09-14 LAB — CMP (CANCER CENTER ONLY)
ALT: 22 U/L (ref 0–44)
AST: 29 U/L (ref 15–41)
Albumin: 4.2 g/dL (ref 3.5–5.0)
Alkaline Phosphatase: 64 U/L (ref 38–126)
Anion gap: 3 — ABNORMAL LOW (ref 5–15)
BUN: 15 mg/dL (ref 8–23)
CO2: 35 mmol/L — ABNORMAL HIGH (ref 22–32)
Calcium: 9.3 mg/dL (ref 8.9–10.3)
Chloride: 103 mmol/L (ref 98–111)
Creatinine: 0.86 mg/dL (ref 0.44–1.00)
GFR, Estimated: >60 mL/min (ref >60)
Glucose, Bld: 88 mg/dL (ref 70–99)
Potassium: 4.1 mmol/L (ref 3.5–5.1)
Sodium: 141 mmol/L (ref 135–145)
Total Bilirubin: 0.5 mg/dL (ref 0.3–1.2)
Total Protein: 6.6 g/dL (ref 6.5–8.1)

## 2021-09-14 LAB — CBC WITH DIFFERENTIAL (CANCER CENTER ONLY)
Abs Immature Granulocytes: 0.01 10*3/uL (ref 0.00–0.07)
Basophils Absolute: 0 10*3/uL (ref 0.0–0.1)
Basophils Relative: 1 %
Eosinophils Absolute: 0.2 10*3/uL (ref 0.0–0.5)
Eosinophils Relative: 4 %
HCT: 33.2 % — ABNORMAL LOW (ref 36.0–46.0)
Hemoglobin: 11.3 g/dL — ABNORMAL LOW (ref 12.0–15.0)
Immature Granulocytes: 0 %
Lymphocytes Relative: 19 %
Lymphs Abs: 0.9 10*3/uL (ref 0.7–4.0)
MCH: 31.7 pg (ref 26.0–34.0)
MCHC: 34 g/dL (ref 30.0–36.0)
MCV: 93.3 fL (ref 80.0–100.0)
Monocytes Absolute: 0.6 10*3/uL (ref 0.1–1.0)
Monocytes Relative: 12 %
Neutro Abs: 3 10*3/uL (ref 1.7–7.7)
Neutrophils Relative %: 64 %
Platelet Count: 276 10*3/uL (ref 150–400)
RBC: 3.56 MIL/uL — ABNORMAL LOW (ref 3.87–5.11)
RDW: 14.6 % (ref 11.5–15.5)
WBC Count: 4.6 10*3/uL (ref 4.0–10.5)
nRBC: 0 % (ref 0.0–0.2)

## 2021-09-14 MED ORDER — IOHEXOL 300 MG/ML  SOLN
100.0000 mL | Freq: Once | INTRAMUSCULAR | Status: AC | PRN
  Administered 2021-09-14: 100 mL via INTRAVENOUS

## 2021-09-16 ENCOUNTER — Inpatient Hospital Stay: Payer: Medicare HMO | Admitting: Internal Medicine

## 2021-09-16 ENCOUNTER — Encounter: Payer: Self-pay | Admitting: *Deleted

## 2021-09-16 ENCOUNTER — Other Ambulatory Visit: Payer: Self-pay

## 2021-09-16 ENCOUNTER — Encounter: Payer: Self-pay | Admitting: Internal Medicine

## 2021-09-16 ENCOUNTER — Other Ambulatory Visit: Payer: Self-pay | Admitting: Medical Oncology

## 2021-09-16 VITALS — BP 140/94 | HR 97 | Temp 98.1°F | Resp 17 | Ht 63.0 in | Wt 168.7 lb

## 2021-09-16 DIAGNOSIS — D0511 Intraductal carcinoma in situ of right breast: Secondary | ICD-10-CM | POA: Diagnosis not present

## 2021-09-16 DIAGNOSIS — J910 Malignant pleural effusion: Secondary | ICD-10-CM | POA: Insufficient documentation

## 2021-09-16 DIAGNOSIS — C3411 Malignant neoplasm of upper lobe, right bronchus or lung: Secondary | ICD-10-CM | POA: Diagnosis not present

## 2021-09-16 DIAGNOSIS — C3412 Malignant neoplasm of upper lobe, left bronchus or lung: Secondary | ICD-10-CM | POA: Insufficient documentation

## 2021-09-16 NOTE — Progress Notes (Signed)
[name] [phone]   [phone]  OFFICE PROGRESS NOTE  Rose Bishop, DO [address] [place] [address]  DIAGNOSIS:  1) Stage IIIB (T2a, N3, M0) non-small cell lung cancer, adenocarcinoma with negative EGFR mutation and negative ALK gene translocation diagnosed in October 2016. 2) right breast ductal carcinoma in situ diagnosed in December 2022.  Biomarker Findings Microsatellite status - MS-Stable Tumor Mutational Burden - TMB-Low (5 Muts/Mb) Genomic Findings For a complete list of the genes assayed, please refer to the Appendix. ROS1 SDC4-ROS1 fusion RBM10 N541f*14 TP53 R273C 7 Disease relevant genes with no reportable alterations: EGFR, KRAS, ALK, BRAF, MET, RET, ERBB2  PRIOR THERAPY:  1) Concurrent chemoradiation with weekly carboplatin for AUC of 2 and paclitaxel 45 MG/M2 status post 6 cycles. Last dose was given 07/13/2015 with partial response. 2) Systemic chemotherapy with carboplatin for AUC of 5 and Alimta 500 MG/M2 every 3 weeks. First dose 11/02/2015. Status post 6 cycles. 3) palliative stereotactic radiotherapy to the left supraclavicular lymph node under the care of Dr. MTammi Klippel  Loss of fraction of radiotherapy scheduled for November 01, 2017 4) palliative radiotherapy to the left supraclavicular lymph node as well as left upper lobe lung mass completed May 04, 2020 under the care of Dr. MTammi Klippel  CURRENT THERAPY: Entrectinib 600 mg p.o. daily.  Started April 23, 2021.  Her dose was reduced to 400 mg p.o. daily.  Status post 5 months of treatment  INTERVAL HISTORY: Rose [id number].[id number]. female returns to the clinic today for follow-up visit.  The patient is feeling fine today with no concerning complaints except for swelling in the left foot.  She had Doppler performed in the past that showed no concerning finding for DVT.  She was also seen by her primary care physician and ruled out for gout.  She  also has some cough and gaining weight on the Entrectinib.  She denied having any current chest pain, shortness of breath or hemoptysis.  She has no nausea, vomiting, diarrhea or constipation.  She has no headache or visual changes.  She is here today for evaluation with repeat CT scan of the chest, abdomen and pelvis for restaging of her disease.  MEDICAL HISTORY: Past Medical History:  Diagnosis Date   Age related osteoporosis 12/18/2020   Anxiety    Atherosclerosis 12/18/2020   Back pain    right back lateral   BCC (basal cell carcinoma of skin)    Bone metastasis ([facility] 10/04/2017   [place] of [place] [place] 05/19/2015   Cardiac murmur 04/24/2020   Chest discomfort 04/24/2020   Chronic pain 12/18/2020   Colon polyps    Complication of anesthesia    pt. states that she is difficult to arouse   Coronary atherosclerosis 04/24/2020   Diverticulosis    Dysrhythmia    "skip beat in early 20's"   Encounter for antineoplastic chemotherapy 06/08/2015   Generalized rash 06/24/2015   H/O cold sores    History of anemia    Lung cancer (HCC)    RUL mass -being tx- radiation and chemo- remains last chemo 01-04-16   Lung field abnormal 12/18/2020   Metastasis to supraclavicular lymph node ([facility] 10/04/2017   Metastatic non-small cell lung cancer ([facility] 03/24/2020   Mixed dyslipidemia 04/24/2020   Oral lesion 06/24/2015   Osteoporosis    Personal history of colonic polyps 12/18/2020   PONV (postoperative nausea and vomiting)  Port catheter in place 11/20/2015   Rectal bleeding    right upper lobe lung mass 04/15/2015   malignant tumor found - 10'16-radiation and chemotherapy and remains with chemo at present- Dr. Earlie Server follows.   Sciatica 12/18/2020   Vitamin D deficiency 12/18/2020    ALLERGIES:  is allergic to latex and sod picosulfate-mag ox-cit acd.  MEDICATIONS:  Current Outpatient Medications  Medication Sig Dispense Refill   Acetylcysteine (NAC 600) 600 MG CAPS Take 600  mg by mouth daily.     albuterol (VENTOLIN HFA) 108 (90 Base) MCG/ACT inhaler Inhale 2 puffs into the lungs every 6 (six) hours as needed for wheezing or shortness of breath.     atorvastatin (LIPITOR) 10 MG tablet TAKE 1 TABLET BY MOUTH EVERY DAY (Patient taking differently: Take 5 mg by mouth daily.) 90 tablet 1   benzonatate (TESSALON) 200 MG capsule Take 1 capsule (200 mg total) by mouth 3 (three) times daily as needed for cough. 20 capsule 0   Biotin 10000 MCG TABS Take 10,000 mcg by mouth daily.     Calcium Carb-Cholecalciferol (CALCIUM 1000 + D PO) Take 1 tablet by mouth 2 (two) times daily. 1000 mg /1200 mg     cetirizine (ZYRTEC) 10 MG tablet Take 10 mg by mouth daily.     denosumab (PROLIA) 60 MG/ML SOSY injection Inject 60 mg into the skin every 6 (six) months.     entrectinib (ROZLYTREK) 200 MG capsule Take 3 capsules (600 mg total) by mouth daily. Swallow capsules whole. Do not open, crush, chew, or dissolve the contents. (Patient taking differently: Take 400 mg by mouth daily. Swallow capsules whole. Do not open, crush, chew, or dissolve the contents.) 90 capsule 3   furosemide (LASIX) 20 MG tablet Take 1 tablet (20 mg total) by mouth daily. 3 tablet 0   L-Lysine 1000 MG TABS Take 1,000 mg by mouth daily.      magnesium oxide (MAG-OX) 400 MG tablet Take 400 mg by mouth daily.     Melatonin 3 MG CAPS Take 3 mg by mouth at bedtime.     Multiple Vitamins-Minerals (PRESERVISION AREDS) CAPS Take 1 capsule by mouth daily.     niacin 500 MG tablet Take 500 mg by mouth daily.     nitroGLYCERIN (NITROSTAT) 0.4 MG SL tablet Place 0.4 mg under the tongue every 5 (five) minutes as needed for chest pain. (Patient not taking: Reported on 07/15/2021)     Omega-3 Fatty Acids (RA FISH OIL PO) Take 1,200 mg by mouth daily.     PREBIOTIC PRODUCT PO Take 1 tablet by mouth daily. culterelle     Psyllium (REGULOID PO) Take 750 mg by mouth daily as needed (Constipation).     Turmeric Curcumin 500 MG CAPS  Take 500 mg by mouth daily.     vitamin B-12 (CYANOCOBALAMIN) 1000 MCG tablet Take 1,000 mcg by mouth daily.     No current facility-administered medications for this visit.    SURGICAL HISTORY:  Past Surgical History:  Procedure Laterality Date   BREAST EXCISIONAL BIOPSY Right    BREAST EXCISIONAL BIOPSY Right    BREAST EXCISIONAL BIOPSY Right    BREAST LUMPECTOMY WITH RADIOACTIVE SEED LOCALIZATION Right 07/28/2021   Procedure: RIGHT BREAST LUMPECTOMY WITH RADIOACTIVE SEED LOCALIZATION X2;  Surgeon: Stark Klein, MD;  Location: [facility];  Service: General;  Laterality: Right;  60 MINUTES ROOM 2   COLONOSCOPY     COLONOSCOPY W/ POLYPECTOMY     COLONOSCOPY WITH PROPOFOL  N/A 01/20/2016   Procedure: COLONOSCOPY WITH PROPOFOL;  Surgeon: Arta Silence, MD;  Location: [facility];  Service: Endoscopy;  Laterality: N/A;   CYSTECTOMY     right breast x 2   IR REMOVAL TUN ACCESS W/ PORT W/O FL MOD SED  12/12/2018   MEDIASTINOSCOPY N/A 05/07/2015   Procedure: MEDIASTINOSCOPY;  Surgeon: Ivin Poot, MD;  Location: [facility];  Service: Thoracic;  Laterality: N/A;   TONSILLECTOMY     VIDEO BRONCHOSCOPY WITH ENDOBRONCHIAL ULTRASOUND N/A 05/07/2015   Procedure: VIDEO BRONCHOSCOPY WITH ENDOBRONCHIAL ULTRASOUND;  Surgeon: Ivin Poot, MD;  Location: [facility];  Service: Thoracic;  Laterality: N/A;    REVIEW OF SYSTEMS:  Constitutional: positive for fatigue Eyes: negative Ears, nose, mouth, throat, and face: negative Respiratory: positive for cough Cardiovascular: negative Gastrointestinal: negative Genitourinary:negative Integument/breast: negative Hematologic/lymphatic: negative Musculoskeletal:positive for arthralgias Neurological: negative Behavioral/Psych: negative Endocrine: negative Allergic/Immunologic: negative   PHYSICAL EXAMINATION: General appearance: alert, cooperative, fatigued and no distress Head: Normocephalic, without obvious abnormality, atraumatic Neck: no adenopathy, no  JVD, supple, symmetrical, trachea midline and thyroid not enlarged, symmetric, no tenderness/mass/nodules Lymph nodes: Cervical, supraclavicular, and axillary nodes normal. Resp: clear to auscultation bilaterally Back: symmetric, no curvature. ROM normal. No CVA tenderness. Cardio: regular rate and rhythm, S1, S2 normal, no murmur, click, rub or gallop GI: soft, non-tender; bowel sounds normal; no masses,  no organomegaly Extremities: extremities normal, atraumatic, no cyanosis or edema Neurological exam unremarkable  ECOG PERFORMANCE STATUS: 1 - Symptomatic but completely ambulatory  Blood pressure (!) 140/94, pulse 97, temperature 98.1 F (36.7 C), temperature source Tympanic, resp. rate 17, height 5' 3"  (1.6 m), weight 168 lb 11.2 oz (76.5 kg), SpO2 93 %.  LABORATORY DATA: Lab Results  Component Value Date   WBC 4.6 09/14/2021   HGB 11.3 (L) 09/14/2021   HCT 33.2 (L) 09/14/2021   MCV 93.3 09/14/2021   PLT 276 09/14/2021      Chemistry      Component Value Date/Time   NA 141 09/14/2021 1024   NA 139 03/20/2017 1038   K 4.1 09/14/2021 1024   K 4.3 03/20/2017 1038   CL 103 09/14/2021 1024   CO2 35 (H) 09/14/2021 1024   CO2 27 03/20/2017 1038   BUN 15 09/14/2021 1024   BUN 15.4 03/20/2017 1038   CREATININE 0.86 09/14/2021 1024   CREATININE 0.7 03/20/2017 1038      Component Value Date/Time   CALCIUM 9.3 09/14/2021 1024   CALCIUM 9.8 03/20/2017 1038   ALKPHOS 64 09/14/2021 1024   ALKPHOS 37 (L) 03/20/2017 1038   AST 29 09/14/2021 1024   AST 25 03/20/2017 1038   ALT 22 09/14/2021 1024   ALT 22 03/20/2017 1038   BILITOT 0.5 09/14/2021 1024   BILITOT 0.42 03/20/2017 1038       RADIOGRAPHIC STUDIES: CT Chest W Contrast  Result Date: 09/15/2021 CLINICAL DATA:  Primary Cancer Type: Lung Imaging Indication: Assess response to therapy Interval therapy since last imaging? Yes Initial Cancer Diagnosis Date: 05/07/2015; Established by: Biopsy-proven Detailed Pathology:  Stage IIIB non-small cell lung cancer, adenocarcinoma. Primary Tumor location:  Right upper lobe. Surgeries: No thoracic. Chemotherapy: Yes; Ongoing? No; Most recent administration: 2017 Immunotherapy? No Radiation therapy? Yes Date Range: 04/21/2020 - 05/04/2020; Target: left supraclavicular lymph node and left upper lobe Date Range: 10/19/2017 - 11/01/2017; Target: left chest and thoracic spine Date Range: 06/01/2015 - 07/16/2015; Target: Right upper lobe Other Cancer Therapies: Entrectinib daily. Right breast lumpectomy with seed localization 07/28/2021 for  right breast ductal carcinoma in situ. EXAM: CT CHEST, ABDOMEN, AND PELVIS WITH CONTRAST TECHNIQUE: Multidetector CT imaging of the chest, abdomen and pelvis was performed following the standard protocol during bolus administration of intravenous contrast. RADIATION DOSE REDUCTION: This exam was performed according to the departmental dose-optimization program which includes automated exposure control, adjustment of the mA and/or kV according to patient size and/or use of iterative reconstruction technique. CONTRAST:  152m OMNIPAQUE IOHEXOL 300 MG/ML  SOLN COMPARISON:  Most recent CT chest, abdomen and pelvis 06/11/2021. 04/01/2020 PET-CT. FINDINGS: CT CHEST FINDINGS Cardiovascular: No acute cardiovascular findings. Post radiation therapy vascular changes in RIGHT upper lobe. Mediastinum/Nodes: No axillary or supraclavicular adenopathy. No mediastinal or hilar adenopathy. No pericardial fluid. Esophagus normal. Postsurgical change in the RIGHT breast is new from prior. Lungs/Pleura: Rounded consolidation in the RIGHT upper lobe along the mediastinal border is not changed from prior. Thickening about the RIGHT hilum is unchanged. There is pleural thickening in the RIGHT lower lobe. Nodular thickening in the azygoesophageal recess measures 7 mm (image 108/6) and not changed from prior. No new nodularity. Pleuroparenchymal thickening in the LEFT upper lobe is  also stable. There are bilateral small pleural effusions. RIGHT effusion is similar to prior. LEFT effusion is increased. Musculoskeletal: Mixed lytic and sclerotic lesion in the upper thoracic spine unchanged. Posterior RIGHT rib fractures again noted.  No new findings. CT ABDOMEN AND PELVIS FINDINGS Hepatobiliary: Several small hypodense lesions in liver unchanged. Gallbladder normal. Pancreas: Pancreas is normal. No ductal dilatation. No pancreatic inflammation. Spleen: Normal spleen Adrenals/urinary tract: Adrenal glands and kidneys are normal. The ureters and bladder normal. Stomach/Bowel: Stomach, small bowel, appendix, and cecum are normal. The colon and rectosigmoid colon are normal. Vascular/Lymphatic: Abdominal aorta is normal caliber. There is no retroperitoneal or periportal lymphadenopathy. No pelvic lymphadenopathy. Reproductive: Uterus and adnexa unremarkable. Other: No peritoneal metastasis Musculoskeletal: No pelvis or lumbar spine metastasis IMPRESSION: Chest Impression: 1. Stable post radiation change in the LEFT and RIGHT upper lobe. No new or suspicious pulmonary nodularity. 2. Stable pleural parenchymal thickening in the RIGHT lower lobe. 3. Bilateral small effusions.  LEFT effusion increased from prior. 4. Stable mixed lytic and sclerotic metastasis in the upper thoracic spine. Stable posterior RIGHT rib fractures. No new skeletal metastasis 5. Postsurgical change in RIGHT breast is new from prior. Abdomen / Pelvis Impression: 1. No evidence of metastatic disease in the abdomen pelvis. Electronically Signed   By: SSuzy BouchardM.D.   On: 09/15/2021 11:27   CT Abdomen Pelvis W Contrast  Result Date: 09/15/2021 CLINICAL DATA:  Primary Cancer Type: Lung Imaging Indication: Assess response to therapy Interval therapy since last imaging? Yes Initial Cancer Diagnosis Date: 05/07/2015; Established by: Biopsy-proven Detailed Pathology: Stage IIIB non-small cell lung cancer, adenocarcinoma.  Primary Tumor location:  Right upper lobe. Surgeries: No thoracic. Chemotherapy: Yes; Ongoing? No; Most recent administration: 2017 Immunotherapy? No Radiation therapy? Yes Date Range: 04/21/2020 - 05/04/2020; Target: left supraclavicular lymph node and left upper lobe Date Range: 10/19/2017 - 11/01/2017; Target: left chest and thoracic spine Date Range: 06/01/2015 - 07/16/2015; Target: Right upper lobe Other Cancer Therapies: Entrectinib daily. Right breast lumpectomy with seed localization 07/28/2021 for right breast ductal carcinoma in situ. EXAM: CT CHEST, ABDOMEN, AND PELVIS WITH CONTRAST TECHNIQUE: Multidetector CT imaging of the chest, abdomen and pelvis was performed following the standard protocol during bolus administration of intravenous contrast. RADIATION DOSE REDUCTION: This exam was performed according to the departmental dose-optimization program which includes automated exposure control, adjustment of the  mA and/or kV according to patient size and/or use of iterative reconstruction technique. CONTRAST:  189m OMNIPAQUE IOHEXOL 300 MG/ML  SOLN COMPARISON:  Most recent CT chest, abdomen and pelvis 06/11/2021. 04/01/2020 PET-CT. FINDINGS: CT CHEST FINDINGS Cardiovascular: No acute cardiovascular findings. Post radiation therapy vascular changes in RIGHT upper lobe. Mediastinum/Nodes: No axillary or supraclavicular adenopathy. No mediastinal or hilar adenopathy. No pericardial fluid. Esophagus normal. Postsurgical change in the RIGHT breast is new from prior. Lungs/Pleura: Rounded consolidation in the RIGHT upper lobe along the mediastinal border is not changed from prior. Thickening about the RIGHT hilum is unchanged. There is pleural thickening in the RIGHT lower lobe. Nodular thickening in the azygoesophageal recess measures 7 mm (image 108/6) and not changed from prior. No new nodularity. Pleuroparenchymal thickening in the LEFT upper lobe is also stable. There are bilateral small pleural effusions.  RIGHT effusion is similar to prior. LEFT effusion is increased. Musculoskeletal: Mixed lytic and sclerotic lesion in the upper thoracic spine unchanged. Posterior RIGHT rib fractures again noted.  No new findings. CT ABDOMEN AND PELVIS FINDINGS Hepatobiliary: Several small hypodense lesions in liver unchanged. Gallbladder normal. Pancreas: Pancreas is normal. No ductal dilatation. No pancreatic inflammation. Spleen: Normal spleen Adrenals/urinary tract: Adrenal glands and kidneys are normal. The ureters and bladder normal. Stomach/Bowel: Stomach, small bowel, appendix, and cecum are normal. The colon and rectosigmoid colon are normal. Vascular/Lymphatic: Abdominal aorta is normal caliber. There is no retroperitoneal or periportal lymphadenopathy. No pelvic lymphadenopathy. Reproductive: Uterus and adnexa unremarkable. Other: No peritoneal metastasis Musculoskeletal: No pelvis or lumbar spine metastasis IMPRESSION: Chest Impression: 1. Stable post radiation change in the LEFT and RIGHT upper lobe. No new or suspicious pulmonary nodularity. 2. Stable pleural parenchymal thickening in the RIGHT lower lobe. 3. Bilateral small effusions.  LEFT effusion increased from prior. 4. Stable mixed lytic and sclerotic metastasis in the upper thoracic spine. Stable posterior RIGHT rib fractures. No new skeletal metastasis 5. Postsurgical change in RIGHT breast is new from prior. Abdomen / Pelvis Impression: 1. No evidence of metastatic disease in the abdomen pelvis. Electronically Signed   By: SSuzy BouchardM.D.   On: 09/15/2021 11:27    ASSESSMENT AND PLAN:  This is a very pleasant 66years old white female with metastatic non-small cell lung cancer initially diagnosed as stage IIIB non-small cell lung cancer, adenocarcinoma with negative EGFR, ALK mutations diagnosed in October 2016.  Molecular studies showed positive ROS 1 She is status post course of concurrent chemoradiation followed by consolidation chemotherapy with  carboplatin and Alimta. The patient has been in observation for close to 3 years. She underwent palliative radiotherapy to the left supraclavicular lymph nodes with improvement of her disease. She also underwent SBRT to left upper lobe lung nodule as well as left supraclavicular lymphadenopathy under the care of Dr. MTammi Klippelcompleted in October 2021. She was found on recent CT scan of the chest to have evidence for disease recurrence in addition to malignant right pleural effusion. The patient started treatment with Entrectinib 600 mg p.o. daily and she has a lot of trouble with the treatment including fatigue, paresthesia, feeling over the most of the time and imbalance of her gait.  Her dose of Entrectinib was reduced to 400 mg p.o. daily several weeks ago and she has been tolerating the lower dose much better. The patient has been tolerating her treatment well except for the swelling of the left lower extremity was tested with Doppler for DVT and this was negative. She had repeat CT  scan of the chest, abdomen pelvis performed recently.  I personally and independently reviewed the scans and discussed the results with the patient today. Her scan showed no concerning findings for disease progression. I recommended for her to continue her current treatment with Entrectinib.  She was wondering about reducing the dose of Entrectinib because of the swelling in the lower extremities and the fatigue.  I will give her a trial of Entrectinib 200 mg p.o. twice daily for the next 2 weeks to see if there is any improvement in the swelling or the other adverse effects. I will see her back for follow-up visit in 4 weeks for evaluation and repeat blood work. For the recently diagnosed ductal carcinoma in situ, status post surgical resection.  I discussed with Dr. Payton Mccallum and the patient does not need any additional treatment at this point including hormonal therapy. She was advised to call immediately if she has any  concerning symptoms in the interval.  The patient voices understanding of current disease status and treatment options and is in agreement with the current care plan. All questions were answered. The patient knows to call the clinic with any problems, questions or concerns. We can certainly see the patient much sooner if necessary.  Disclaimer: This note was dictated with voice recognition software. Similar sounding words can inadvertently be transcribed and may not be corrected upon review.

## 2021-09-20 ENCOUNTER — Telehealth: Payer: Self-pay

## 2021-09-22 NOTE — Telephone Encounter (Signed)
Pt called to advise since her dose has been reduced, she has seen improvement in her swelling ?

## 2021-09-28 ENCOUNTER — Telehealth: Payer: Self-pay | Admitting: Medical Oncology

## 2021-09-28 NOTE — Telephone Encounter (Signed)
Update on Entrectinib 200 mg bid.  ? ?Her calf is decreased in size . ? ?"My foot and ankle are blown up after I walk for an hour and my vision is double and my eyes are playing tricks on me". ? ?Her family has noticed that Rose Figueroa is much more awake and happier. ? ?Should she restart her 400 mg dose on Thursday which is when she is due to go back to 400 mg/day? ? ?Next appt 3/23. ? ?I called her back and her husband said she is outside working in the yard. ?

## 2021-09-29 ENCOUNTER — Encounter (HOSPITAL_COMMUNITY): Payer: Self-pay

## 2021-10-10 ENCOUNTER — Other Ambulatory Visit: Payer: Self-pay | Admitting: Cardiology

## 2021-10-14 ENCOUNTER — Inpatient Hospital Stay: Payer: Medicare HMO | Admitting: Internal Medicine

## 2021-10-14 ENCOUNTER — Encounter: Payer: Self-pay | Admitting: Internal Medicine

## 2021-10-14 ENCOUNTER — Encounter: Payer: Self-pay | Admitting: *Deleted

## 2021-10-14 ENCOUNTER — Inpatient Hospital Stay: Payer: Medicare HMO | Attending: Internal Medicine

## 2021-10-14 ENCOUNTER — Other Ambulatory Visit: Payer: Self-pay

## 2021-10-14 VITALS — BP 138/91 | HR 93 | Temp 97.2°F | Resp 18 | Ht 63.0 in | Wt 169.1 lb

## 2021-10-14 DIAGNOSIS — C3431 Malignant neoplasm of lower lobe, right bronchus or lung: Secondary | ICD-10-CM | POA: Diagnosis not present

## 2021-10-14 DIAGNOSIS — C3411 Malignant neoplasm of upper lobe, right bronchus or lung: Secondary | ICD-10-CM

## 2021-10-14 DIAGNOSIS — Z5111 Encounter for antineoplastic chemotherapy: Secondary | ICD-10-CM

## 2021-10-14 DIAGNOSIS — C3490 Malignant neoplasm of unspecified part of unspecified bronchus or lung: Secondary | ICD-10-CM | POA: Diagnosis not present

## 2021-10-14 DIAGNOSIS — D0511 Intraductal carcinoma in situ of right breast: Secondary | ICD-10-CM | POA: Diagnosis not present

## 2021-10-14 DIAGNOSIS — C7951 Secondary malignant neoplasm of bone: Secondary | ICD-10-CM | POA: Insufficient documentation

## 2021-10-14 DIAGNOSIS — C3412 Malignant neoplasm of upper lobe, left bronchus or lung: Secondary | ICD-10-CM | POA: Diagnosis not present

## 2021-10-14 LAB — CBC WITH DIFFERENTIAL (CANCER CENTER ONLY)
Abs Immature Granulocytes: 0.02 10*3/uL (ref 0.00–0.07)
Basophils Absolute: 0.1 10*3/uL (ref 0.0–0.1)
Basophils Relative: 1 %
Eosinophils Absolute: 0.2 10*3/uL (ref 0.0–0.5)
Eosinophils Relative: 6 %
HCT: 32.8 % — ABNORMAL LOW (ref 36.0–46.0)
Hemoglobin: 11.1 g/dL — ABNORMAL LOW (ref 12.0–15.0)
Immature Granulocytes: 1 %
Lymphocytes Relative: 21 %
Lymphs Abs: 0.9 10*3/uL (ref 0.7–4.0)
MCH: 31.7 pg (ref 26.0–34.0)
MCHC: 33.8 g/dL (ref 30.0–36.0)
MCV: 93.7 fL (ref 80.0–100.0)
Monocytes Absolute: 0.6 10*3/uL (ref 0.1–1.0)
Monocytes Relative: 15 %
Neutro Abs: 2.4 10*3/uL (ref 1.7–7.7)
Neutrophils Relative %: 56 %
Platelet Count: 211 10*3/uL (ref 150–400)
RBC: 3.5 MIL/uL — ABNORMAL LOW (ref 3.87–5.11)
RDW: 14.8 % (ref 11.5–15.5)
WBC Count: 4.2 10*3/uL (ref 4.0–10.5)
nRBC: 0 % (ref 0.0–0.2)

## 2021-10-14 LAB — CMP (CANCER CENTER ONLY)
ALT: 27 U/L (ref 0–44)
AST: 30 U/L (ref 15–41)
Albumin: 4.2 g/dL (ref 3.5–5.0)
Alkaline Phosphatase: 63 U/L (ref 38–126)
Anion gap: 5 (ref 5–15)
BUN: 23 mg/dL (ref 8–23)
CO2: 31 mmol/L (ref 22–32)
Calcium: 9.1 mg/dL (ref 8.9–10.3)
Chloride: 102 mmol/L (ref 98–111)
Creatinine: 1.06 mg/dL — ABNORMAL HIGH (ref 0.44–1.00)
GFR, Estimated: 58 mL/min — ABNORMAL LOW (ref >60)
Glucose, Bld: 82 mg/dL (ref 70–99)
Potassium: 4.4 mmol/L (ref 3.5–5.1)
Sodium: 138 mmol/L (ref 135–145)
Total Bilirubin: 0.4 mg/dL (ref 0.3–1.2)
Total Protein: 6.6 g/dL (ref 6.5–8.1)

## 2021-10-14 NOTE — Progress Notes (Signed)
Oncology Nurse Navigator Documentation ? ? ?  10/14/2021  ? 11:00 AM 09/16/2021  ?  4:00 PM 08/16/2021  ?  1:00 PM 08/03/2021  ? 12:00 PM 07/12/2021  ?  3:00 PM 07/06/2021  ?  2:00 PM 07/01/2021  ?  3:00 PM  ?Oncology Nurse Navigator Flowsheets  ?Diagnosis Status      Confirmed Diagnosis Complete   ?Planned Course of Treatment  Surgery       ?Phase of Treatment      Surgery   ?Surgery Actual Start Date:      07/28/2021   ?Navigator Follow Up Date:   09/17/2021 08/24/2021 08/04/2021 07/13/2021 07/06/2021  ?Navigator Follow Up Reason:   Review Note Appointment Review Pathology Appointment Review Appointment Review  ?Navigation Complete Date:  09/16/2021       ?Post Navigation: Continue to Follow Patient? Yes No       ?Navigator Location [facility]  ?Navigator Encounter Type Clinic/[facility] Appt/Treatment Plan Review Appt/Treatment Plan Review Pathology Review Appt/Treatment Plan Review Appt/Treatment Plan Review Appt/Treatment Plan Review  ?Treatment Initiated Date      07/28/2021   ?Patient Visit Type MedOnc/I spoke with Ms. Despain today.  It was great catching up with her.  She is currently on oral biologic.  I help to educate and added information about medication on AVS.  I did give her contrast today due to her next scan will be before her next appt.         ?Treatment Phase Treatment        ?Barriers/Navigation Needs Education        ?Education Other        ?Interventions Education;Psycho-Social Support None Required  Coordination of Care  Coordination of Care;Referrals Coordination of Care  ?Acuity Level 3-Moderate Needs (3-4 Barriers Identified)        ?Referrals      Radiation Oncology   ?Coordination of Care       Appts  ?Education Method Verbal;Other        ?Time Spent with Patient 30 15 15 15 15 15 15   ?  ?

## 2021-10-14 NOTE — Patient Instructions (Signed)
Entrectinib oral capsules ?What is this medication? ?ENTRECTINIB (en TREK ti nib) is a medicine that targets proteins in cancer cells and stops the cancer cells from growing. It is used to treat lung cancer and neurotrophic receptor tyrosine kinase gene fusion-positive solid tumors. ?This medicine may be used for other purposes; ask your health care provider or pharmacist if you have questions. ?COMMON BRAND NAME(S): ROZLYTREK ?What should I tell my care team before I take this medication? ?They need to know if you have any of these conditions: ?anxiety or panic attacks ?bone problems ?dementia ?depression ?eye disease, vision problems ?heart disease or heart failure ?history of irregular heartbeat ?kidney disease ?liver disease ?trouble sleeping ?an unusual or allergic reaction to entrectinib, other medicines, foods, dyes, or preservatives ?pregnant or trying to get pregnant ?breast-feeding ?How should I use this medication? ?Take this medicine by mouth with a glass of water. Follow the directions on the prescription label. Do not cut, crush or chew this medicine. Swallow the capsules whole. Do not take with grapefruit juice. You can take it with or without food. If it upsets your stomach, take it with food. Take your medicine at regular intervals. Do not take it more often than directed. Do not stop taking except on your doctor's advice. ?Talk to your pediatrician regarding the use of this medicine in children. While this drug may be prescribed for children as young as 6 years old for selected conditions, precautions do apply. ?Overdosage: If you think you have taken too much of this medicine contact a poison control center or emergency room at once. ?NOTE: This medicine is only for you. Do not share this medicine with others. ?What if I miss a dose? ?If you miss a dose, take it as soon as you can. If your next dose is to be taken in less than 12 hours, then do not take the missed dose. Take the next dose at your  regular time. Do not take double or extra doses. ?What may interact with this medication? ?Do not take this medicine with any of the following medications: ?cisapride ?dronedarone ?pimozide ?thioridazine ?This medicine may also interact with the following medications: ?aprepitant ?bosentan ?certain antibiotics like erythromycin or clarithromycin ?certain antivirals for HIV or hepatitis ?certain medicines for depression, anxiety, or psychotic disorders ?certain medicines for fungal infections like fluconazole, itraconazole, ketoconazole, posaconazole, and voriconazole ?certain medicines for irregular heart beat like amiodarone, dofetilide, encainide, flecainide, propafenone, quinidine ?certain medicines for seizures like carbamazepine, phenobarbital, phenytoin ?cimetidine ?ciprofloxacin ?conivaptan ?crizotinib ?cyclosporine ?digoxin ?diltiazem ?enzalutamide ?grapefruit juice ?idelalisib ?imatinib ?mifepristone ?mitotane ?modafinil ?other medicines that prolong the QT interval (cause an abnormal heart rhythm) ?rifampin ?St. John's Wort ?verapamil ?This list may not describe all possible interactions. Give your health care provider a list of all the medicines, herbs, non-prescription drugs, or dietary supplements you use. Also tell them if you smoke, drink alcohol, or use illegal drugs. Some items may interact with your medicine. ?What should I watch for while using this medication? ?This drug may make you feel generally unwell. This is not uncommon, as chemotherapy can affect healthy cells as well as cancer cells. Report any side effects. Continue your course of treatment even though you feel ill unless your doctor tells you to stop. You will need blood work done while you are taking this medicine. ?Tell your doctor or health care professional right away if you have any change in your eyesight. ?Avoid taking products that contain aspirin, acetaminophen, ibuprofen, naproxen, or ketoprofen unless instructed by your  doctor. These medicines may hide a fever. ?Call your doctor or health care professional for advice if you get a fever, chills or sore throat, or other symptoms of a cold or flu. Do not treat yourself. This drug decreases your body's ability to fight infections. Try to avoid being around people who are sick. ?Do not become pregnant while taking this medicine or for 5 weeks after stopping it. Women should inform their doctor if they wish to become pregnant or think they might be pregnant. Men should not father a child while taking this medicine and for 3 months after stopping it. There is a potential for serious side effects to an unborn child. Talk to your health care professional or pharmacist for more information. ?Do not breast-feed an infant while taking this medicine or for 7 days after stopping it. ?What side effects may I notice from receiving this medication? ?Side effects that you should report to your doctor or health care professional as soon as possible: ?allergic reactions like skin rash, itching or hives; swelling of the face, lips, or tongue ?bone pain ?changes in vision ?changes in emotions or moods ?confusion ?loss of balance or coordination ?loss of memory ?pain, tingling, numbness in the hands or feet ?sensitivity to light ?signs and symptoms of a dangerous change in heartbeat or heart rhythm like chest pain; dizziness; fast or irregular heartbeat; palpitations; feeling faint or lightheaded, falls; breathing problems ?signs and symptoms of heart failure like breathing problems, fast, irregular heartbeat, sudden weight gain; swelling of the ankles, feet, hands; unusually weak or tired ?signs and symptoms of infection like fever or chills; cough; sore throat; pain or trouble passing urine ?signs and symptoms of liver injury like dark yellow or brown urine; general ill feeling or flu-like symptoms; light-colored stools; loss of appetite; nausea; right upper belly pain; unusually weak or tired;  yellowing of the eyes or skin ?vomiting ?Side effects that usually do not require medical attention (report these to your doctor or health care professional if they continue or are bothersome): ?changes in taste ?constipation ?diarrhea ?dizziness ?joint pain ?loss of appetite ?nausea ?muscle pain ?tiredness ?trouble sleeping ?This list may not describe all possible side effects. Call your doctor for medical advice about side effects. You may report side effects to FDA at 1-800-FDA-1088. ?Where should I keep my medication? ?Keep out of the reach of children and pets. ?Store below 30 degrees C (86 degrees F). Keep this medication in the original container. Protect from moisture. Keep the container tightly closed. Get rid of any unused medication after the expiration date. ?To get rid of medications that are no longer needed or have expired: ?Take the medication to a medication take-back program. Check with your pharmacy or law enforcement to find a location. ?If you cannot return the medication, check the label or package insert to see if the medication should be thrown in the garbage or flushed down the toilet. If you are not sure, ask your care team. If it is safe to put in the trash, empty the medication out of the container. Mix the medication with cat litter, dirt, coffee grounds, or other unwanted substance. Seal the mixture in a bag or container. Put it in the trash. ?NOTE: This sheet is a summary. It may not cover all possible information. If you have questions about this medicine, talk to your doctor, pharmacist, or health care provider. ?? 2022 Elsevier/Gold Standard (2021-03-03 00:00:00) ? ?

## 2021-10-14 NOTE — Progress Notes (Signed)
?    [facility] ?[phone]   [phone] ? ?OFFICE PROGRESS NOTE ? ?Masneri, Adele Barthel, DO ?[address] ?[place] [address] ? ?DIAGNOSIS:  ?1) Stage IIIB (T2a, N3, M0) non-small cell lung cancer, adenocarcinoma with negative EGFR mutation and negative ALK gene translocation diagnosed in October 2016. ?2) right breast ductal carcinoma in situ diagnosed in December 2022. ? ?Biomarker Findings ?Microsatellite status - MS-Stable ?Tumor Mutational Burden - TMB-Low (5 Muts/Mb) ?Genomic Findings ?For a complete list of the genes assayed, please refer to the Appendix. ?ROS1 SDC4-ROS1 fusion ?RBM10 N577f*14 ?TP53 R273C ?7 Disease relevant genes with no reportable alterations: EGFR, KRAS, ?ALK, BRAF, MET, RET, ERBB2 ? ?PRIOR THERAPY:  ?1) Concurrent chemoradiation with weekly carboplatin for AUC of 2 and paclitaxel 45 MG/M2 status post 6 cycles. Last dose was given 07/13/2015 with partial response. ?2) Systemic chemotherapy with carboplatin for AUC of 5 and Alimta 500 MG/M2 every 3 weeks. First dose 11/02/2015. Status post 6 cycles. ?3) palliative stereotactic radiotherapy to the left supraclavicular lymph node under the care of Dr. MTammi Klippel  Loss of fraction of radiotherapy scheduled for November 01, 2017 ?4) palliative radiotherapy to the left supraclavicular lymph node as well as left upper lobe lung mass completed May 04, 2020 under the care of Dr. MTammi Klippel ? ?CURRENT THERAPY: Entrectinib 600 mg p.o. daily.  Started April 23, 2021.  Her dose was reduced to 400 mg p.o. daily.  Status post 6 months of treatment ? ?INTERVAL HISTORY: ?Rose PERLSTEIN89y.o. female returns to the clinic today for follow-up visit.  The patient is feeling fine today with no concerning complaints except for fatigue and lack of stamina.  She does not exercise at regular basis.  She denied having any current chest pain, shortness of breath except with exertion with no cough or hemoptysis.  She  denied having any nausea, vomiting, diarrhea or constipation.  She has no headache but has some occasional visual changes.  She denied having any recent weight loss or night sweats.  She continues to tolerate her treatment with Entrectinib 200 mg p.o. daily fairly well.  She is here today for evaluation and repeat blood work. ?  ?MEDICAL HISTORY: ?Past Medical History:  ?Diagnosis Date  ? Age related osteoporosis 12/18/2020  ? Anxiety   ? Atherosclerosis 12/18/2020  ? Back pain   ? right back lateral  ? BCC (basal cell carcinoma of skin)   ? Bone metastasis ([facility] 10/04/2017  ? [place] of [place] of [place] 05/19/2015  ? Cardiac murmur 04/24/2020  ? Chest discomfort 04/24/2020  ? Chronic pain 12/18/2020  ? Colon polyps   ? Complication of anesthesia   ? pt. states that she is difficult to arouse  ? Coronary atherosclerosis 04/24/2020  ? Diverticulosis   ? Dysrhythmia   ? "skip beat in early 20's"  ? Encounter for antineoplastic chemotherapy 06/08/2015  ? Generalized rash 06/24/2015  ? H/O cold sores   ? History of anemia   ? Lung cancer ([facility]   ? RUL mass -being tx- radiation and chemo- remains last chemo 01-04-16  ? Lung field abnormal 12/18/2020  ? Metastasis to supraclavicular lymph node ([facility] 10/04/2017  ? Metastatic non-small cell lung cancer ([facility] 03/24/2020  ? Mixed dyslipidemia 04/24/2020  ? Oral lesion 06/24/2015  ? Osteoporosis   ? Personal history of colonic polyps 12/18/2020  ? PONV (postoperative nausea and vomiting)   ? Port catheter in place 11/20/2015  ?  Rectal bleeding   ? right upper lobe lung mass 04/15/2015  ? malignant tumor found - 10'16-radiation and chemotherapy and remains with chemo at present- Dr. Earlie Server follows.  ? Sciatica 12/18/2020  ? Vitamin D deficiency 12/18/2020  ? ? ?ALLERGIES:  is allergic to latex and sod picosulfate-mag ox-cit acd. ? ?MEDICATIONS:  ?Current Outpatient Medications  ?Medication Sig Dispense Refill  ? Acetylcysteine (NAC 600) 600 MG CAPS Take 600 mg by mouth daily.     ? albuterol (VENTOLIN HFA) 108 (90 Base) MCG/ACT inhaler Inhale 2 puffs into the lungs every 6 (six) hours as needed for wheezing or shortness of breath.    ? atorvastatin (LIPITOR) 10 MG tablet Take 1 tablet (10 mg total) by mouth daily. 30 tablet 1  ? benzonatate (TESSALON) 200 MG capsule Take 1 capsule (200 mg total) by mouth 3 (three) times daily as needed for cough. 20 capsule 0  ? Biotin 10000 MCG TABS Take 10,000 mcg by mouth daily.    ? Calcium Carb-Cholecalciferol (CALCIUM 1000 + D PO) Take 1 tablet by mouth 2 (two) times daily. 1000 mg /1200 mg    ? cetirizine (ZYRTEC) 10 MG tablet Take 10 mg by mouth daily.    ? denosumab (PROLIA) 60 MG/ML SOSY injection Inject 60 mg into the skin every 6 (six) months.    ? L-Lysine 1000 MG TABS Take 1,000 mg by mouth daily.     ? Melatonin 3 MG CAPS Take 3 mg by mouth at bedtime.    ? Multiple Vitamins-Minerals (PRESERVISION AREDS) CAPS Take 1 capsule by mouth daily.    ? niacin 500 MG tablet Take 500 mg by mouth daily.    ? nitroGLYCERIN (NITROSTAT) 0.4 MG SL tablet Place 0.4 mg under the tongue every 5 (five) minutes as needed for chest pain. (Patient not taking: Reported on 07/15/2021)    ? Omega-3 Fatty Acids (RA FISH OIL PO) Take 1,200 mg by mouth daily.    ? PREBIOTIC PRODUCT PO Take 1 tablet by mouth daily. culterelle    ? Psyllium (REGULOID PO) Take 750 mg by mouth daily as needed (Constipation).    ? Turmeric Curcumin 500 MG CAPS Take 500 mg by mouth daily.    ? vitamin B-12 (CYANOCOBALAMIN) 1000 MCG tablet Take 1,000 mcg by mouth daily.    ? ?No current facility-administered medications for this visit.  ? ? ?SURGICAL HISTORY:  ?Past Surgical History:  ?Procedure Laterality Date  ? BREAST EXCISIONAL BIOPSY Right   ? BREAST EXCISIONAL BIOPSY Right   ? BREAST EXCISIONAL BIOPSY Right   ? BREAST LUMPECTOMY WITH RADIOACTIVE SEED LOCALIZATION Right 07/28/2021  ? Procedure: RIGHT BREAST LUMPECTOMY WITH RADIOACTIVE SEED LOCALIZATION X2;  Surgeon: Stark Klein, MD;   Location: [facility];  Service: General;  Laterality: Right;  60 MINUTES ROOM 2  ? COLONOSCOPY    ? COLONOSCOPY W/ POLYPECTOMY    ? COLONOSCOPY WITH PROPOFOL N/A 01/20/2016  ? Procedure: COLONOSCOPY WITH PROPOFOL;  Surgeon: Arta Silence, MD;  Location: [facility];  Service: Endoscopy;  Laterality: N/A;  ? CYSTECTOMY    ? right breast x 2  ? IR REMOVAL TUN ACCESS W/ PORT W/O FL MOD SED  12/12/2018  ? MEDIASTINOSCOPY N/A 05/07/2015  ? Procedure: MEDIASTINOSCOPY;  Surgeon: Ivin Poot, MD;  Location: [facility];  Service: Thoracic;  Laterality: N/A;  ? TONSILLECTOMY    ? VIDEO BRONCHOSCOPY WITH ENDOBRONCHIAL ULTRASOUND N/A 05/07/2015  ? Procedure: VIDEO BRONCHOSCOPY WITH ENDOBRONCHIAL ULTRASOUND;  Surgeon: Ivin Poot, MD;  Location: [facility];  Service: Thoracic;  Laterality: N/A;  ? ? ?REVIEW OF SYSTEMS:  A comprehensive review of systems was negative except for: Constitutional: positive for fatigue ?Eyes: positive for visual disturbance ?Musculoskeletal: positive for muscle weakness  ? ?PHYSICAL EXAMINATION: General appearance: alert, cooperative, fatigued and no distress ?Head: Normocephalic, without obvious abnormality, atraumatic ?Neck: no adenopathy, no JVD, supple, symmetrical, trachea midline and thyroid not enlarged, symmetric, no tenderness/mass/nodules ?Lymph nodes: Cervical, supraclavicular, and axillary nodes normal. ?Resp: clear to auscultation bilaterally ?Back: symmetric, no curvature. ROM normal. No CVA tenderness. ?Cardio: regular rate and rhythm, S1, S2 normal, no murmur, click, rub or gallop ?GI: soft, non-tender; bowel sounds normal; no masses,  no organomegaly ?Extremities: extremities normal, atraumatic, no cyanosis or edema ?Neurological exam unremarkable ? ?ECOG PERFORMANCE STATUS: 1 - Symptomatic but completely ambulatory ? ?Blood pressure (!) 138/91, pulse 93, temperature (!) 97.2 ?F (36.2 ?C), temperature source Tympanic, resp. rate 18, height 5' 3"  (1.6 m), weight 169 lb 1.6 oz (76.7 kg), SpO2  95 %. ? ?LABORATORY DATA: ?Lab Results  ?Component Value Date  ? WBC 4.2 10/14/2021  ? HGB 11.1 (L) 10/14/2021  ? HCT 32.8 (L) 10/14/2021  ? MCV 93.7 10/14/2021  ? PLT 211 10/14/2021  ? ? ?  Chemistry

## 2021-10-19 ENCOUNTER — Other Ambulatory Visit: Payer: Self-pay | Admitting: Internal Medicine

## 2021-10-19 MED ORDER — ENTRECTINIB 200 MG PO CAPS: 400.0000 mg | ORAL_CAPSULE | Freq: Every day | ORAL | 2 refills | Status: DC

## 2021-10-21 ENCOUNTER — Telehealth: Payer: Self-pay

## 2021-10-21 NOTE — Telephone Encounter (Signed)
Pt LVM stating additional information was needed by [facility] and to please call them at [phone] or [phone]. ? ?I have called [place] and spoke with a representative there who advised they too don't know what information the pt felt we needed and confirmed they have all the information they needed.  ? ?I then attempted to call the pt on a conference call but there was no answer, so I left a voicemail. ?

## 2021-10-22 ENCOUNTER — Encounter: Payer: Self-pay | Admitting: Internal Medicine

## 2021-10-26 ENCOUNTER — Telehealth: Payer: Self-pay | Admitting: Internal Medicine

## 2021-10-26 ENCOUNTER — Telehealth: Payer: Self-pay | Admitting: Medical Oncology

## 2021-10-26 DIAGNOSIS — L509 Urticaria, unspecified: Secondary | ICD-10-CM

## 2021-10-26 MED ORDER — METHYLPREDNISOLONE 4 MG PO TBPK: ORAL_TABLET | ORAL | 0 refills | Status: DC

## 2021-10-26 NOTE — Telephone Encounter (Signed)
.  Called patient to schedule appointment per 4/3 INBASKET , patient is aware of date and time.   ?

## 2021-10-26 NOTE — Telephone Encounter (Signed)
?   Drug Reaction- ? left arm swelling/tightness/Hives ?Rose Figueroa reports she woke up today @ 1000 with swelling &tightness in her left arm . "My watch is tight". ?This afternoon she broke out in hives all over her body with her "ears on fire" and her face red. ?She denies SOB,facial swelling, anxious,dyspnea. Per Dr Julien Nordmann I told Rose Figueroa he prescribed a medrol dose pak. ?I instructed her to go to to ED for any worsening symptoms. ?I asked her to call back tomorrow with update. She voiced understanding. ?Pt asking if she can hod the Entrectinib. ?

## 2021-10-26 NOTE — Telephone Encounter (Signed)
Per Dr Julien Nordmann I told Rose Figueroa she can hold the Entrectinib for now and he does not know what effect it will have on her cancer. ?

## 2021-10-27 ENCOUNTER — Telehealth: Payer: Self-pay | Admitting: Medical Oncology

## 2021-10-27 NOTE — Telephone Encounter (Signed)
Hives update- ?Cleared up yesterday after steroid and a bath ,however ,today she had  breakthrough hives .  ? ?She is pretty sure today's hives are from eating nightshade eggplant and tomatoes today . ?I told her to continue medrol dose pak . She denies any swelling,sob or other signs of allergic reaction.  ?Mohamed to advise. ? ?

## 2021-11-03 ENCOUNTER — Other Ambulatory Visit: Payer: Self-pay | Admitting: Cardiology

## 2021-11-03 DIAGNOSIS — H2511 Age-related nuclear cataract, right eye: Secondary | ICD-10-CM | POA: Diagnosis not present

## 2021-11-03 DIAGNOSIS — H5213 Myopia, bilateral: Secondary | ICD-10-CM | POA: Diagnosis not present

## 2021-11-03 DIAGNOSIS — H40003 Preglaucoma, unspecified, bilateral: Secondary | ICD-10-CM | POA: Diagnosis not present

## 2021-11-03 DIAGNOSIS — H2512 Age-related nuclear cataract, left eye: Secondary | ICD-10-CM | POA: Diagnosis not present

## 2021-11-03 DIAGNOSIS — H524 Presbyopia: Secondary | ICD-10-CM | POA: Diagnosis not present

## 2021-11-03 DIAGNOSIS — H52209 Unspecified astigmatism, unspecified eye: Secondary | ICD-10-CM | POA: Diagnosis not present

## 2021-11-17 ENCOUNTER — Telehealth: Payer: Self-pay | Admitting: Internal Medicine

## 2021-11-17 NOTE — Telephone Encounter (Signed)
Called patient regarding upcoming appointments, patient is notified. 

## 2021-11-26 DIAGNOSIS — T451X5A Adverse effect of antineoplastic and immunosuppressive drugs, initial encounter: Secondary | ICD-10-CM | POA: Diagnosis not present

## 2021-11-26 DIAGNOSIS — M14672 Charcot's joint, left ankle and foot: Secondary | ICD-10-CM | POA: Diagnosis not present

## 2021-11-26 DIAGNOSIS — M79672 Pain in left foot: Secondary | ICD-10-CM | POA: Diagnosis not present

## 2021-11-29 ENCOUNTER — Encounter: Payer: Self-pay | Admitting: *Deleted

## 2021-11-29 NOTE — Progress Notes (Signed)
I received a call from Ms. Rose Figueroa today. We talked about her side effects of the medication and she has decided to stop this for now.  I will update Dr. Julien Nordmann. She is scheduled to see Dr. Julien Nordmann next week and would like him to be notified.  ?

## 2021-12-07 ENCOUNTER — Ambulatory Visit (HOSPITAL_COMMUNITY)
Admission: RE | Admit: 2021-12-07 | Discharge: 2021-12-07 | Disposition: A | Discharge status: Home / Self Care | Payer: Medicare HMO | Source: Ambulatory Visit | Attending: Internal Medicine | Admitting: Internal Medicine

## 2021-12-07 ENCOUNTER — Inpatient Hospital Stay: Payer: Medicare HMO | Attending: Internal Medicine

## 2021-12-07 ENCOUNTER — Other Ambulatory Visit: Payer: Self-pay

## 2021-12-07 DIAGNOSIS — C3491 Malignant neoplasm of unspecified part of right bronchus or lung: Secondary | ICD-10-CM | POA: Insufficient documentation

## 2021-12-07 DIAGNOSIS — D0511 Intraductal carcinoma in situ of right breast: Secondary | ICD-10-CM | POA: Diagnosis not present

## 2021-12-07 DIAGNOSIS — J181 Lobar pneumonia, unspecified organism: Secondary | ICD-10-CM | POA: Diagnosis not present

## 2021-12-07 DIAGNOSIS — C7989 Secondary malignant neoplasm of other specified sites: Secondary | ICD-10-CM | POA: Diagnosis not present

## 2021-12-07 DIAGNOSIS — J90 Pleural effusion, not elsewhere classified: Secondary | ICD-10-CM | POA: Diagnosis not present

## 2021-12-07 DIAGNOSIS — C3490 Malignant neoplasm of unspecified part of unspecified bronchus or lung: Secondary | ICD-10-CM

## 2021-12-07 DIAGNOSIS — C778 Secondary and unspecified malignant neoplasm of lymph nodes of multiple regions: Secondary | ICD-10-CM | POA: Diagnosis not present

## 2021-12-07 DIAGNOSIS — K769 Liver disease, unspecified: Secondary | ICD-10-CM | POA: Diagnosis not present

## 2021-12-07 DIAGNOSIS — J189 Pneumonia, unspecified organism: Secondary | ICD-10-CM | POA: Diagnosis not present

## 2021-12-07 LAB — CBC WITH DIFFERENTIAL (CANCER CENTER ONLY)
Abs Immature Granulocytes: 0.01 10*3/uL (ref 0.00–0.07)
Basophils Absolute: 0 10*3/uL (ref 0.0–0.1)
Basophils Relative: 1 %
Eosinophils Absolute: 0.1 10*3/uL (ref 0.0–0.5)
Eosinophils Relative: 3 %
HCT: 39.5 % (ref 36.0–46.0)
Hemoglobin: 13.4 g/dL (ref 12.0–15.0)
Immature Granulocytes: 0 %
Lymphocytes Relative: 29 %
Lymphs Abs: 1.2 10*3/uL (ref 0.7–4.0)
MCH: 30.8 pg (ref 26.0–34.0)
MCHC: 33.9 g/dL (ref 30.0–36.0)
MCV: 90.8 fL (ref 80.0–100.0)
Monocytes Absolute: 0.5 10*3/uL (ref 0.1–1.0)
Monocytes Relative: 11 %
Neutro Abs: 2.4 10*3/uL (ref 1.7–7.7)
Neutrophils Relative %: 56 %
Platelet Count: 253 10*3/uL (ref 150–400)
RBC: 4.35 MIL/uL (ref 3.87–5.11)
RDW: 13.6 % (ref 11.5–15.5)
WBC Count: 4.3 10*3/uL (ref 4.0–10.5)
nRBC: 0 % (ref 0.0–0.2)

## 2021-12-07 LAB — CMP (CANCER CENTER ONLY)
ALT: 44 U/L (ref 0–44)
AST: 30 U/L (ref 15–41)
Albumin: 4.2 g/dL (ref 3.5–5.0)
Alkaline Phosphatase: 58 U/L (ref 38–126)
Anion gap: 5 (ref 5–15)
BUN: 14 mg/dL (ref 8–23)
CO2: 30 mmol/L (ref 22–32)
Calcium: 9.4 mg/dL (ref 8.9–10.3)
Chloride: 105 mmol/L (ref 98–111)
Creatinine: 0.7 mg/dL (ref 0.44–1.00)
GFR, Estimated: >60 mL/min (ref >60)
Glucose, Bld: 95 mg/dL (ref 70–99)
Potassium: 4.4 mmol/L (ref 3.5–5.1)
Sodium: 140 mmol/L (ref 135–145)
Total Bilirubin: 0.4 mg/dL (ref 0.3–1.2)
Total Protein: 6.8 g/dL (ref 6.5–8.1)

## 2021-12-07 MED ORDER — SODIUM CHLORIDE (PF) 0.9 % IJ SOLN
INTRAMUSCULAR | Status: AC
  Filled 2021-12-07: qty 50

## 2021-12-07 MED ORDER — IOHEXOL 300 MG/ML  SOLN
100.0000 mL | Freq: Once | INTRAMUSCULAR | Status: AC | PRN
  Administered 2021-12-07: 100 mL via INTRAVENOUS

## 2021-12-09 ENCOUNTER — Ambulatory Visit: Payer: Medicare HMO | Admitting: Internal Medicine

## 2021-12-09 ENCOUNTER — Inpatient Hospital Stay: Payer: Medicare HMO | Admitting: Internal Medicine

## 2021-12-09 ENCOUNTER — Other Ambulatory Visit: Payer: Self-pay

## 2021-12-09 ENCOUNTER — Encounter: Payer: Self-pay | Admitting: Internal Medicine

## 2021-12-09 VITALS — BP 149/92 | HR 99 | Temp 97.3°F | Resp 17 | Wt 158.2 lb

## 2021-12-09 DIAGNOSIS — C778 Secondary and unspecified malignant neoplasm of lymph nodes of multiple regions: Secondary | ICD-10-CM | POA: Diagnosis not present

## 2021-12-09 DIAGNOSIS — C3411 Malignant neoplasm of upper lobe, right bronchus or lung: Secondary | ICD-10-CM | POA: Diagnosis not present

## 2021-12-09 DIAGNOSIS — C3491 Malignant neoplasm of unspecified part of right bronchus or lung: Secondary | ICD-10-CM | POA: Diagnosis not present

## 2021-12-09 DIAGNOSIS — D0511 Intraductal carcinoma in situ of right breast: Secondary | ICD-10-CM | POA: Diagnosis not present

## 2021-12-09 NOTE — Progress Notes (Signed)
Rose Figueroa [phone]   [phone]  OFFICE PROGRESS NOTE  Lyman Bishop, DO [address] [place] [address]  DIAGNOSIS:  1) Stage IIIB (T2a, N3, M0) non-small cell lung cancer, adenocarcinoma with negative EGFR mutation and negative ALK gene translocation diagnosed in October 2016. 2) right breast ductal carcinoma in situ diagnosed in December 2022.  Biomarker Findings Microsatellite status - MS-Stable Tumor Mutational Burden - TMB-Low (5 Muts/Mb) Genomic Findings For a complete list of the genes assayed, please refer to the Appendix. ROS1 SDC4-ROS1 fusion RBM10 N538f*14 TP53 R273C 7 Disease relevant genes with no reportable alterations: EGFR, KRAS, ALK, BRAF, MET, RET, ERBB2  PRIOR THERAPY:  1) Concurrent chemoradiation with weekly carboplatin for AUC of 2 and paclitaxel 45 MG/M2 status post 6 cycles. Last dose was given 07/13/2015 with partial response. 2) Systemic chemotherapy with carboplatin for AUC of 5 and Alimta 500 MG/M2 every 3 weeks. First dose 11/02/2015. Status post 6 cycles. 3) palliative stereotactic radiotherapy to the left supraclavicular lymph node under the care of Dr. MTammi Klippel  Loss of fraction of radiotherapy scheduled for November 01, 2017 4) palliative radiotherapy to the left supraclavicular lymph node as well as left upper lobe lung mass completed May 04, 2020 under the care of Dr. MTammi Klippel  CURRENT THERAPY: Entrectinib 600 mg p.o. daily.  Started April 23, 2021.  Her dose was reduced to 400 mg p.o. daily.  Status post 7 months of treatment.  Her treatment has been on hold since October 25, 2021 secondary to intolerance  INTERVAL HISTORY: Rose MERVINE66y.o. female returns to the clinic today for follow-up visit accompanied by her husband Rose Figueroa  The patient is feeling fine today with no concerning complaints except for the swelling of the left upper extremity as well as increasing fatigue and  weakness and back pain.  She discontinued her treatment with Entrectinib on October 25, 2021 secondary to intolerance and increasing fatigue and weakness as well as rash.  She has no current chest pain but has shortness of breath with exertion with no cough or hemoptysis.  She has no nausea, vomiting, diarrhea or constipation.  She has no headache or visual changes.  She has no recent weight loss or night sweats.  The patient had repeat CT scan of the chest, abdomen pelvis performed recently and she is here for evaluation and discussion of her risk her results.   MEDICAL HISTORY: Past Medical History:  Diagnosis Date   Age related osteoporosis 12/18/2020   Anxiety    Atherosclerosis 12/18/2020   Back pain    right back lateral   BCC (basal cell carcinoma of skin)    Bone metastasis 10/04/2017   [place] [place]2015   Cardiac murmur 04/24/2020   Chest discomfort 04/24/2020   Chronic pain 12/18/2020   Colon polyps    Complication of anesthesia    pt. states that she is difficult to arouse   Coronary atherosclerosis 04/24/2020   Diverticulosis    Dysrhythmia    "skip beat in early 20's"   Encounter for antineoplastic chemotherapy 06/08/2015   Generalized rash 06/24/2015   H/O cold sores    History of anemia    Lung cancer (HCC)    RUL mass -being tx- radiation and chemo- remains last chemo 01-04-16   Lung field abnormal 12/18/2020   Metastasis to supraclavicular lymph node ([facility] 10/04/2017   Metastatic non-small cell lung  cancer ([facility]) 03/24/2020   Mixed dyslipidemia 04/24/2020   Oral lesion 06/24/2015   Osteoporosis    Personal history of colonic polyps 12/18/2020   PONV (postoperative nausea and vomiting)    Port catheter in place 11/20/2015   Rectal bleeding    right upper lobe lung mass 04/15/2015   malignant tumor found - 10'16-radiation and chemotherapy and remains with chemo at present- Dr. Earlie Server follows.   Sciatica 12/18/2020   Vitamin D deficiency  12/18/2020    ALLERGIES:  is allergic to latex and sod picosulfate-mag ox-cit acd.  MEDICATIONS:  Current Outpatient Medications  Medication Sig Dispense Refill   Acetylcysteine (NAC 600) 600 MG CAPS Take 600 mg by mouth daily.     albuterol (VENTOLIN HFA) 108 (90 Base) MCG/ACT inhaler Inhale 2 puffs into the lungs every 6 (six) hours as needed for wheezing or shortness of breath.     atorvastatin (LIPITOR) 10 MG tablet TAKE 1 TABLET BY MOUTH EVERY DAY 30 tablet 1   benzonatate (TESSALON) 200 MG capsule Take 1 capsule (200 mg total) by mouth 3 (three) times daily as needed for cough. 20 capsule 0   Biotin 10000 MCG TABS Take 10,000 mcg by mouth daily.     Calcium Carb-Cholecalciferol (CALCIUM 1000 + D PO) Take 1 tablet by mouth 2 (two) times daily. 1000 mg /1200 mg     cetirizine (ZYRTEC) 10 MG tablet Take 10 mg by mouth daily.     denosumab (PROLIA) 60 MG/ML SOSY injection Inject 60 mg into the skin every 6 (six) months.     L-Lysine 1000 MG TABS Take 1,000 mg by mouth daily.      Melatonin 3 MG CAPS Take 3 mg by mouth at bedtime.     Multiple Vitamins-Minerals (PRESERVISION AREDS) CAPS Take 1 capsule by mouth daily.     niacin 500 MG tablet Take 500 mg by mouth daily.     nitroGLYCERIN (NITROSTAT) 0.4 MG SL tablet Place 0.4 mg under the tongue every 5 (five) minutes as needed for chest pain. (Patient not taking: Reported on 07/15/2021)     Omega-3 Fatty Acids (RA FISH OIL PO) Take 1,200 mg by mouth daily.     PREBIOTIC PRODUCT PO Take 1 tablet by mouth daily. culterelle     Psyllium (REGULOID PO) Take 750 mg by mouth daily as needed (Constipation).     Turmeric Curcumin 500 MG CAPS Take 500 mg by mouth daily.     vitamin B-12 (CYANOCOBALAMIN) 1000 MCG tablet Take 1,000 mcg by mouth daily.     No current facility-administered medications for this visit.    SURGICAL HISTORY:  Past Surgical History:  Procedure Laterality Date   BREAST EXCISIONAL BIOPSY Right    BREAST EXCISIONAL  BIOPSY Right    BREAST EXCISIONAL BIOPSY Right    BREAST LUMPECTOMY WITH RADIOACTIVE SEED LOCALIZATION Right 07/28/2021   Procedure: RIGHT BREAST LUMPECTOMY WITH RADIOACTIVE SEED LOCALIZATION X2;  Surgeon: Stark Klein, MD;  Location: [facility];  Service: General;  Laterality: Right;  60 MINUTES ROOM 2   COLONOSCOPY     COLONOSCOPY W/ POLYPECTOMY     COLONOSCOPY WITH PROPOFOL N/A 01/20/2016   Procedure: COLONOSCOPY WITH PROPOFOL;  Surgeon: Arta Silence, MD;  Location: [facility];  Service: Endoscopy;  Laterality: N/A;   CYSTECTOMY     right breast x 2   IR REMOVAL TUN ACCESS W/ PORT W/O FL MOD SED  12/12/2018   MEDIASTINOSCOPY N/A 05/07/2015   Procedure: MEDIASTINOSCOPY;  Surgeon: Tharon Aquas  Kerby Less, MD;  Location: [facility];  Service: Thoracic;  Laterality: N/A;   TONSILLECTOMY     VIDEO BRONCHOSCOPY WITH ENDOBRONCHIAL ULTRASOUND N/A 05/07/2015   Procedure: VIDEO BRONCHOSCOPY WITH ENDOBRONCHIAL ULTRASOUND;  Surgeon: Ivin Poot, MD;  Location: [facility];  Service: Thoracic;  Laterality: N/A;    REVIEW OF SYSTEMS:  Constitutional: positive for fatigue Eyes: negative Ears, nose, mouth, throat, and face: negative Respiratory: positive for dyspnea on exertion Cardiovascular: negative Gastrointestinal: negative Genitourinary:negative Integument/breast: negative Hematologic/lymphatic: negative Musculoskeletal:positive for back pain Neurological: negative Behavioral/Psych: negative Endocrine: negative Allergic/Immunologic: negative   PHYSICAL EXAMINATION: General appearance: alert, cooperative, fatigued and no distress Head: Normocephalic, without obvious abnormality, atraumatic Neck: no adenopathy, no JVD, supple, symmetrical, trachea midline and thyroid not enlarged, symmetric, no tenderness/mass/nodules Lymph nodes: Cervical, supraclavicular, and axillary nodes normal. Resp: clear to auscultation bilaterally Back: symmetric, no curvature. ROM normal. No CVA tenderness. Cardio: regular rate  and rhythm, S1, S2 normal, no murmur, click, rub or gallop GI: soft, non-tender; bowel sounds normal; no masses,  no organomegaly Extremities: extremities normal, atraumatic, no cyanosis or edema Neurological exam unremarkable  ECOG PERFORMANCE STATUS: 1 - Symptomatic but completely ambulatory  Blood pressure (!) 149/92, pulse 99, temperature (!) 97.3 F (36.3 C), temperature source Tympanic, resp. rate 17, weight 158 lb 4 oz (71.8 kg), SpO2 99 %.  LABORATORY DATA: Lab Results  Component Value Date   WBC 4.3 12/07/2021   HGB 13.4 12/07/2021   HCT 39.5 12/07/2021   MCV 90.8 12/07/2021   PLT 253 12/07/2021      Chemistry      Component Value Date/Time   NA 140 12/07/2021 1031   NA 139 03/20/2017 1038   K 4.4 12/07/2021 1031   K 4.3 03/20/2017 1038   CL 105 12/07/2021 1031   CO2 30 12/07/2021 1031   CO2 27 03/20/2017 1038   BUN 14 12/07/2021 1031   BUN 15.4 03/20/2017 1038   CREATININE 0.70 12/07/2021 1031   CREATININE 0.7 03/20/2017 1038      Component Value Date/Time   CALCIUM 9.4 12/07/2021 1031   CALCIUM 9.8 03/20/2017 1038   ALKPHOS 58 12/07/2021 1031   ALKPHOS 37 (L) 03/20/2017 1038   AST 30 12/07/2021 1031   AST 25 03/20/2017 1038   ALT 44 12/07/2021 1031   ALT 22 03/20/2017 1038   BILITOT 0.4 12/07/2021 1031   BILITOT 0.42 03/20/2017 1038       RADIOGRAPHIC STUDIES: CT Chest W Contrast  Result Date: 12/08/2021 CLINICAL DATA:  Primary Cancer Type: Lung Imaging Indication: Assess response to therapy Interval therapy since last imaging? Yes Initial Cancer Diagnosis Date: 05/07/2015; Established by: Biopsy-proven Detailed Pathology: Stage IIIB non-small cell lung cancer, adenocarcinoma. Primary Tumor location:  Right upper lobe. Surgeries: No thoracic. Chemotherapy: Yes; Ongoing? No; Most recent administration: 2017 Immunotherapy? No Radiation therapy? Yes Date Range: 04/21/2020 - 05/04/2020; Target: left supraclavicular lymph node and left upper lobe Date Range:  10/19/2017 - 11/01/2017; Target: left chest and thoracic spine Date Range: 06/01/2015 - 07/16/2015; Target: Right upper lobe Other Cancer Therapies: Entrectinib daily. Right breast lumpectomy with seed localization 07/28/2021 for right breast ductal carcinoma in situ. * Tracking Code: BO * EXAM: CT CHEST, ABDOMEN, AND PELVIS WITH CONTRAST TECHNIQUE: Multidetector CT imaging of the chest, abdomen and pelvis was performed following the standard protocol during bolus administration of intravenous contrast. RADIATION DOSE REDUCTION: This exam was performed according to the departmental dose-optimization program which includes automated exposure control, adjustment of the mA and/or kV  according to patient size and/or use of iterative reconstruction technique. CONTRAST:  100 mL OMNIPAQUE IOHEXOL 300 MG/ML  SOLN COMPARISON:  Most recent CT chest, abdomen and pelvis 09/14/2021. 04/01/2020 PET-CT. FINDINGS: CT CHEST FINDINGS Cardiovascular: Venous collaterals extending over the LEFT chest wall. No pulmonary embolism. Trace pericardial thickening/fluid. Mediastinum/Nodes: Soft tissue thickening in the RIGHT hilum is similar to prior. No discrete measurable lymph nodes. No supraclavicular adenopathy. Cluster of mildly enlarged LEFT axillary lymph nodes. The more superior nodes are unchanged (image 9 mm node on image 15/2 common for example). However, more inferior LEFT axillary node is enlarged (measuring 10 mm on image 21/2). Lungs/Pleura: Focus of rounded atelectasis/consolidation in the medial RIGHT suprahilar lung is unchanged (image 42/4. There is a moderate RIGHT effusion slightly increased in volume. No new measurable nodules in the RIGHT lung. No LEFT lung pulmonary nodules. Musculoskeletal: Healed bilateral rib fractures. Chronic compression fracture at T6 with sclerosis. Sclerosis in the T2 vertebral body additionally. Unchanged from prior CT ABDOMEN AND PELVIS FINDINGS Hepatobiliary: Several small hypodense lesions  in liver unchanged from prior gallbladder normal. Pancreas: Pancreas is normal. No ductal dilatation. No pancreatic inflammation. Spleen: Normal spleen Adrenals/urinary tract: Adrenal glands and kidneys are normal. The ureters and bladder normal. Stomach/Bowel: Stomach, small bowel, appendix, and cecum are normal. The colon and rectosigmoid colon are normal. Vascular/Lymphatic: Abdominal aorta is normal caliber. There is no retroperitoneal or periportal lymphadenopathy. No pelvic lymphadenopathy. Reproductive: Uterus and adnexa unremarkable. Other: No free fluid. Musculoskeletal: No aggressive osseous lesion. IMPRESSION: Chest Impression: 1. Stable RIGHT suprahilar consolidation without interval growth. 2. Slight increase in RIGHT pleural effusion. 3. Stable sclerotic metastasis in the upper thoracic spine. Stable healed rib fractures. 4. New enlarged prominent LEFT axillary lymph nodes. Consider biopsy or FDG PET scan to evaluate for malignant adenopathy. Abdomen / Pelvis Impression: 1. No metastatic disease in the abdomen pelvis. Electronically Signed   By: Suzy Bouchard M.D.   On: 12/08/2021 11:26   CT Abdomen Pelvis W Contrast  Result Date: 12/08/2021 CLINICAL DATA:  Primary Cancer Type: Lung Imaging Indication: Assess response to therapy Interval therapy since last imaging? Yes Initial Cancer Diagnosis Date: 05/07/2015; Established by: Biopsy-proven Detailed Pathology: Stage IIIB non-small cell lung cancer, adenocarcinoma. Primary Tumor location:  Right upper lobe. Surgeries: No thoracic. Chemotherapy: Yes; Ongoing? No; Most recent administration: 2017 Immunotherapy? No Radiation therapy? Yes Date Range: 04/21/2020 - 05/04/2020; Target: left supraclavicular lymph node and left upper lobe Date Range: 10/19/2017 - 11/01/2017; Target: left chest and thoracic spine Date Range: 06/01/2015 - 07/16/2015; Target: Right upper lobe Other Cancer Therapies: Entrectinib daily. Right breast lumpectomy with seed  localization 07/28/2021 for right breast ductal carcinoma in situ. * Tracking Code: BO * EXAM: CT CHEST, ABDOMEN, AND PELVIS WITH CONTRAST TECHNIQUE: Multidetector CT imaging of the chest, abdomen and pelvis was performed following the standard protocol during bolus administration of intravenous contrast. RADIATION DOSE REDUCTION: This exam was performed according to the departmental dose-optimization program which includes automated exposure control, adjustment of the mA and/or kV according to patient size and/or use of iterative reconstruction technique. CONTRAST:  100 mL OMNIPAQUE IOHEXOL 300 MG/ML  SOLN COMPARISON:  Most recent CT chest, abdomen and pelvis 09/14/2021. 04/01/2020 PET-CT. FINDINGS: CT CHEST FINDINGS Cardiovascular: Venous collaterals extending over the LEFT chest wall. No pulmonary embolism. Trace pericardial thickening/fluid. Mediastinum/Nodes: Soft tissue thickening in the RIGHT hilum is similar to prior. No discrete measurable lymph nodes. No supraclavicular adenopathy. Cluster of mildly enlarged LEFT axillary lymph nodes. The more superior  nodes are unchanged (image 9 mm node on image 15/2 common for example). However, more inferior LEFT axillary node is enlarged (measuring 10 mm on image 21/2). Lungs/Pleura: Focus of rounded atelectasis/consolidation in the medial RIGHT suprahilar lung is unchanged (image 42/4. There is a moderate RIGHT effusion slightly increased in volume. No new measurable nodules in the RIGHT lung. No LEFT lung pulmonary nodules. Musculoskeletal: Healed bilateral rib fractures. Chronic compression fracture at T6 with sclerosis. Sclerosis in the T2 vertebral body additionally. Unchanged from prior CT ABDOMEN AND PELVIS FINDINGS Hepatobiliary: Several small hypodense lesions in liver unchanged from prior gallbladder normal. Pancreas: Pancreas is normal. No ductal dilatation. No pancreatic inflammation. Spleen: Normal spleen Adrenals/urinary tract: Adrenal glands and kidneys  are normal. The ureters and bladder normal. Stomach/Bowel: Stomach, small bowel, appendix, and cecum are normal. The colon and rectosigmoid colon are normal. Vascular/Lymphatic: Abdominal aorta is normal caliber. There is no retroperitoneal or periportal lymphadenopathy. No pelvic lymphadenopathy. Reproductive: Uterus and adnexa unremarkable. Other: No free fluid. Musculoskeletal: No aggressive osseous lesion. IMPRESSION: Chest Impression: 1. Stable RIGHT suprahilar consolidation without interval growth. 2. Slight increase in RIGHT pleural effusion. 3. Stable sclerotic metastasis in the upper thoracic spine. Stable healed rib fractures. 4. New enlarged prominent LEFT axillary lymph nodes. Consider biopsy or FDG PET scan to evaluate for malignant adenopathy. Abdomen / Pelvis Impression: 1. No metastatic disease in the abdomen pelvis. Electronically Signed   By: Suzy Bouchard M.D.   On: 12/08/2021 11:26    ASSESSMENT AND PLAN:  This is a very pleasant 66 years old white female with metastatic non-small cell lung cancer initially diagnosed as stage IIIB non-small cell lung cancer, adenocarcinoma with negative EGFR, ALK mutations diagnosed in October 2016.  Molecular studies showed positive ROS 1 She is status post course of concurrent chemoradiation followed by consolidation chemotherapy with carboplatin and Alimta. The patient has been in observation for close to 3 years. She underwent palliative radiotherapy to the left supraclavicular lymph nodes with improvement of her disease. She also underwent SBRT to left upper lobe lung nodule as well as left supraclavicular lymphadenopathy under the care of Dr. Tammi Klippel completed in October 2021. She was found on recent CT scan of the chest to have evidence for disease recurrence in addition to malignant right pleural effusion. The patient started treatment with Entrectinib 600 mg p.o. daily and she has a lot of trouble with the treatment including fatigue,  paresthesia, feeling over the most of the time and imbalance of her gait.  Her dose of Entrectinib was reduced to 400 mg p.o. daily several weeks ago and she has been tolerating the lower dose much better.  Her dose was even reduced to Entrectinib 200 mg p.o. daily but she continues to have issues with this medication with increasing fatigue and weakness as well as rash. She discontinued her treatment completely on October 25, 2021 because of the poor quality of life. The patient had repeat CT scan of the chest, abdomen pelvis performed recently.  I personally and independently reviewed the scan images and discussed the result and showed the images to the patient and her husband. Her scan showed no concerning findings for disease progression except for enlarging left axillary lymph node suspicious to be secondary to her breast cancer but also progressive lung cancer could not be completely excluded. I recommended for the patient to have ultrasound guided core biopsy of the left axillary lymph node for confirmation of her diagnosis. I will see her back for follow-up visit  in around 3-4 weeks for evaluation and discussion of her treatment options based on the biopsy results. If the progressive disease is consistent with non-small cell lung cancer, I may discuss with her other treatment options for her condition including all targeted therapy with Xalkori or Lorlatinib. The patient was advised to call immediately if she has any other concerning symptoms in the interval. Over the last few weeks she was treated with Entrectinib 200 mg daily.  She tolerated this dose much better. For the recently diagnosed ductal carcinoma in situ, status post surgical resection.  I discussed with Dr. Lindi Adie and the patient does not need any additional treatment at this point including hormonal therapy.    The patient voices understanding of current disease status and treatment options and is in agreement with the current care  plan. All questions were answered. The patient knows to call the clinic with any problems, questions or concerns. We can certainly see the patient much sooner if necessary.  Disclaimer: This note was dictated with voice recognition software. Similar sounding words can inadvertently be transcribed and may not be corrected upon review.

## 2021-12-13 ENCOUNTER — Encounter: Payer: Self-pay | Admitting: *Deleted

## 2021-12-13 NOTE — Progress Notes (Signed)
I received a message that Rose Figueroa's bx needs to get scheduled.  Radiology notified this bx is in review and will call her with an appt for procedure. I called [name] to give her an update. I was unable to reach but did leave vm message with my name and phone number to call.

## 2021-12-14 ENCOUNTER — Other Ambulatory Visit: Payer: Medicare HMO

## 2021-12-14 ENCOUNTER — Encounter: Payer: Self-pay | Admitting: *Deleted

## 2021-12-14 ENCOUNTER — Telehealth: Payer: Self-pay | Admitting: Medical Oncology

## 2021-12-14 NOTE — Telephone Encounter (Signed)
It LVM for  Tylee that a PA is  not required for [place] bx per  [facility]-"[facility] is saying no PA required, all the patient other scans has been authorized".

## 2021-12-14 NOTE — Progress Notes (Unsigned)
Greggory Keen, MD  [facility] Procedure Requests [place] for [place] core bx left axillary adenopathy   TS

## 2021-12-16 ENCOUNTER — Ambulatory Visit: Payer: Medicare HMO | Admitting: Internal Medicine

## 2021-12-16 ENCOUNTER — Other Ambulatory Visit: Payer: Self-pay | Admitting: Radiology

## 2021-12-16 DIAGNOSIS — R590 Localized enlarged lymph nodes: Secondary | ICD-10-CM

## 2021-12-17 ENCOUNTER — Encounter (HOSPITAL_COMMUNITY): Payer: Self-pay

## 2021-12-17 ENCOUNTER — Ambulatory Visit (HOSPITAL_COMMUNITY)
Admission: RE | Admit: 2021-12-17 | Discharge: 2021-12-17 | Disposition: A | Discharge status: Home / Self Care | Payer: Medicare HMO | Source: Ambulatory Visit | Attending: Internal Medicine | Admitting: Internal Medicine

## 2021-12-17 DIAGNOSIS — C50919 Malignant neoplasm of unspecified site of unspecified female breast: Secondary | ICD-10-CM | POA: Diagnosis not present

## 2021-12-17 DIAGNOSIS — C7951 Secondary malignant neoplasm of bone: Secondary | ICD-10-CM | POA: Diagnosis not present

## 2021-12-17 DIAGNOSIS — Z85118 Personal history of other malignant neoplasm of bronchus and lung: Secondary | ICD-10-CM | POA: Diagnosis not present

## 2021-12-17 DIAGNOSIS — Z86000 Personal history of in-situ neoplasm of breast: Secondary | ICD-10-CM | POA: Insufficient documentation

## 2021-12-17 DIAGNOSIS — R590 Localized enlarged lymph nodes: Secondary | ICD-10-CM

## 2021-12-17 DIAGNOSIS — C3411 Malignant neoplasm of upper lobe, right bronchus or lung: Secondary | ICD-10-CM | POA: Insufficient documentation

## 2021-12-17 DIAGNOSIS — C773 Secondary and unspecified malignant neoplasm of axilla and upper limb lymph nodes: Secondary | ICD-10-CM | POA: Diagnosis not present

## 2021-12-17 DIAGNOSIS — J90 Pleural effusion, not elsewhere classified: Secondary | ICD-10-CM | POA: Insufficient documentation

## 2021-12-17 LAB — CBC WITH DIFFERENTIAL/PLATELET
Abs Immature Granulocytes: 0.02 10*3/uL (ref 0.00–0.07)
Basophils Absolute: 0 10*3/uL (ref 0.0–0.1)
Basophils Relative: 1 %
Eosinophils Absolute: 0.2 10*3/uL (ref 0.0–0.5)
Eosinophils Relative: 4 %
HCT: 41 % (ref 36.0–46.0)
Hemoglobin: 13.8 g/dL (ref 12.0–15.0)
Immature Granulocytes: 1 %
Lymphocytes Relative: 28 %
Lymphs Abs: 1.1 10*3/uL (ref 0.7–4.0)
MCH: 30.9 pg (ref 26.0–34.0)
MCHC: 33.7 g/dL (ref 30.0–36.0)
MCV: 91.9 fL (ref 80.0–100.0)
Monocytes Absolute: 0.4 10*3/uL (ref 0.1–1.0)
Monocytes Relative: 11 %
Neutro Abs: 2.2 10*3/uL (ref 1.7–7.7)
Neutrophils Relative %: 55 %
Platelets: 329 10*3/uL (ref 150–400)
RBC: 4.46 MIL/uL (ref 3.87–5.11)
RDW: 14 % (ref 11.5–15.5)
WBC: 4 10*3/uL (ref 4.0–10.5)
nRBC: 0 % (ref 0.0–0.2)

## 2021-12-17 LAB — PROTIME-INR
INR: 1 (ref 0.8–1.2)
Prothrombin Time: 13.2 seconds (ref 11.4–15.2)

## 2021-12-17 MED ORDER — SODIUM CHLORIDE 0.9 % IV SOLN
INTRAVENOUS | Status: DC
Start: 2021-12-17 — End: 2021-12-18

## 2021-12-17 MED ORDER — MIDAZOLAM HCL 2 MG/2ML IJ SOLN
INTRAMUSCULAR | Status: AC | PRN
  Administered 2021-12-17: 1 mg via INTRAVENOUS

## 2021-12-17 MED ORDER — LIDOCAINE HCL 1 % IJ SOLN
INTRAMUSCULAR | Status: AC | PRN
  Administered 2021-12-17: 10 mL via INTRADERMAL

## 2021-12-17 MED ORDER — FENTANYL CITRATE (PF) 100 MCG/2ML IJ SOLN
INTRAMUSCULAR | Status: AC
  Filled 2021-12-17: qty 2

## 2021-12-17 MED ORDER — MIDAZOLAM HCL 2 MG/2ML IJ SOLN
INTRAMUSCULAR | Status: AC
  Filled 2021-12-17: qty 2

## 2021-12-17 MED ORDER — GELATIN ABSORBABLE 12-7 MM EX MISC
CUTANEOUS | Status: AC
  Filled 2021-12-17: qty 1

## 2021-12-17 MED ORDER — FENTANYL CITRATE (PF) 100 MCG/2ML IJ SOLN
INTRAMUSCULAR | Status: AC | PRN
  Administered 2021-12-17: 50 ug via INTRAVENOUS

## 2021-12-17 MED ORDER — LIDOCAINE HCL 1 % IJ SOLN
INTRAMUSCULAR | Status: AC
  Filled 2021-12-17: qty 20

## 2021-12-17 NOTE — Procedures (Signed)
Interventional Radiology Procedure Note  Procedure: [place] CORE BX LEFT AX ADENOPATHY    Complications: None  Estimated Blood Loss:  MIN  Findings: 18 G CORE X 5    M. Daryll Brod, MD

## 2021-12-17 NOTE — H&P (Signed)
Referring Physician(s): Mohamed,Mohamed  Supervising Physician: Daryll Brod  Patient Status:  [facility]  Chief Complaint: " I'm here for a biopsy"   Subjective: Patient known to IR service from Port-A-Cath placement in 2016 and removal in 2020, left supraclavicular lymph node biopsy in 2019 and right thoracentesis in 2022.  She has a history of metastatic lung cancer diagnosed in 2016 as well as right breast DCIS diagnosed in 2022.  Latest posttreatment scans revealed:  Chest Impression:   1. Stable RIGHT suprahilar consolidation without interval growth. 2. Slight increase in RIGHT pleural effusion. 3. Stable sclerotic metastasis in the upper thoracic spine. Stable healed rib fractures. 4. New enlarged prominent LEFT axillary lymph nodes. Consider biopsy or FDG PET scan to evaluate for malignant adenopathy.   Abdomen / Pelvis Impression:   1. No metastatic disease in the abdomen pelvis  She presents today for image guided left axillary lymph node biopsy for further evaluation.  She currently denies fever, headache, chest pain, worsening dyspnea, cough, abdominal pain, nausea, vomiting or bleeding.  She does have upper back pain.  Additional medical history as below.  Past Medical History:  Diagnosis Date   Age related osteoporosis 12/18/2020   Anxiety    Atherosclerosis 12/18/2020   Back pain    right back lateral   BCC (basal cell carcinoma of skin)    Bone metastasis 10/04/2017   Cancer of upper lobe of right lung ([place]) 05/19/2015   Cardiac murmur 04/24/2020   Chest discomfort 04/24/2020   Chronic pain 12/18/2020   Colon polyps    Complication of anesthesia    pt. 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 Earlie Server follows.   Sciatica 12/18/2020   Vitamin D deficiency 12/18/2020   Past Surgical History:  Procedure Laterality Date   BREAST EXCISIONAL BIOPSY Right    BREAST EXCISIONAL BIOPSY Right    BREAST EXCISIONAL BIOPSY Right    BREAST LUMPECTOMY WITH RADIOACTIVE SEED LOCALIZATION Right 07/28/2021   Procedure: RIGHT BREAST LUMPECTOMY WITH RADIOACTIVE SEED LOCALIZATION X2;  Surgeon: Stark Klein, MD;  Location: [facility];  Service: General;  Laterality: Right;  60 MINUTES ROOM 2   COLONOSCOPY     COLONOSCOPY W/ POLYPECTOMY     COLONOSCOPY WITH PROPOFOL N/A 01/20/2016   Procedure: COLONOSCOPY WITH PROPOFOL;  Surgeon: Arta Silence, MD;  Location: [facility];  Service: Endoscopy;  Laterality: N/A;   CYSTECTOMY     right breast x 2   IR REMOVAL TUN ACCESS W/ PORT W/O FL MOD SED  12/12/2018   MEDIASTINOSCOPY N/A 05/07/2015   Procedure: MEDIASTINOSCOPY;  Surgeon: Ivin Poot, MD;  Location: [facility];  Service: Thoracic;  Laterality: N/A;   TONSILLECTOMY     VIDEO BRONCHOSCOPY WITH ENDOBRONCHIAL ULTRASOUND N/A 05/07/2015   Procedure: VIDEO BRONCHOSCOPY WITH ENDOBRONCHIAL ULTRASOUND;  Surgeon: Ivin Poot, MD;  Location: [facility];  Service: Thoracic;  Laterality: N/A;     Allergies: Latex and Sod picosulfate-mag ox-cit acd  Medications: Prior to Admission medications   Medication Sig Start Date End Date  Taking? Authorizing Provider  atorvastatin (LIPITOR) 10 MG tablet TAKE 1 TABLET BY MOUTH EVERY DAY 11/03/21  Yes Revankar, Reita Cliche, MD  Acetylcysteine (NAC 600) 600 MG CAPS Take 600 mg by mouth daily.    [provider]  albuterol (VENTOLIN HFA) 108 (90 Base) MCG/ACT inhaler Inhale 2 puffs into the lungs every 6 (six) hours as needed for wheezing or shortness of breath. 12/11/20   [provider]  benzonatate (TESSALON) 200 MG capsule Take 1 capsule (200 mg total) by mouth 3 (three) times daily as needed for cough. 08/02/21   Curt Bears, MD  Biotin 10000 MCG TABS Take 10,000 mcg by mouth daily.    [provider]  Calcium Carb-Cholecalciferol (CALCIUM 1000 + D PO) Take 1 tablet by mouth 2 (two) times daily. 1000 mg /1200 mg    [provider]  cetirizine (ZYRTEC) 10 MG tablet Take 10 mg by mouth daily.    [provider]  denosumab (PROLIA) 60 MG/ML SOSY injection Inject 60 mg into the skin every 6 (six) months.    [provider]  L-Lysine 1000 MG TABS Take 1,000 mg by mouth daily.     [provider]  Melatonin 3 MG CAPS Take 3 mg by mouth at bedtime.    [provider]  Multiple Vitamins-Minerals (PRESERVISION AREDS) CAPS Take 1 capsule by mouth daily.    [provider]  niacin 500 MG tablet Take 500 mg by mouth daily. 08/21/20   [provider]  nitroGLYCERIN (NITROSTAT) 0.4 MG SL tablet Place 0.4 mg under the tongue every 5 (five) minutes as needed for chest pain. Patient not taking: Reported on 07/15/2021    [provider]  Omega-3 Fatty Acids (RA FISH OIL PO) Take 1,200 mg by mouth daily.    [provider]  PREBIOTIC PRODUCT PO Take 1 tablet by mouth daily. culterelle    [provider]  Psyllium (REGULOID PO) Take 750 mg by mouth daily as needed (Constipation).    [provider]  Turmeric Curcumin 500 MG CAPS Take 500 mg by mouth daily.    [provider]  vitamin B-12 (CYANOCOBALAMIN) 1000 MCG tablet Take 1,000 mcg by mouth daily.    [provider]     Vital Signs: BP (!) 131/98   Pulse 97   Temp 98 F (36.7 C) (Oral)   Resp 18   SpO2 97%   Physical Exam awake, alert.  Chest with slightly diminished breath sounds right base, left clear.  Heart with regular rate and rhythm.  Abdomen soft, positive bowel sounds, nontender.  No right lower extremity edema, left lower extremity in boot  Imaging: No results found.  Labs:  CBC: Recent Labs    09/14/21 1024 10/14/21 1010 12/07/21 1031 12/17/21 1133  WBC 4.6 4.2 4.3 4.0  HGB 11.3* 11.1* 13.4 13.8  HCT 33.2* 32.8* 39.5 41.0  PLT 276 211 253 329    COAGS: No results for input(s): INR, APTT in the last 8760 hours.  BMP:  Recent Labs    08/11/21 1347 09/14/21 1024 10/14/21 1010 12/07/21 1031  NA 140 141 138 140  K 4.1 4.1 4.4 4.4  CL 103 103 102 105  CO2 34* 35* 31 30  GLUCOSE 97 88 82 95  BUN 20 15 23 14   CALCIUM 9.2 9.3 9.1 9.4  CREATININE 0.92 0.86 1.06* 0.70  GFRNONAA >60 >60 58* >60    LIVER FUNCTION TESTS: Recent Labs    08/11/21 1347 09/14/21 1024 10/14/21 1010 12/07/21 1031  BILITOT 0.3 0.5 0.4 0.4  AST 22 29 30 30   ALT 17 22 27  44  ALKPHOS 60 64 63 58  PROT 6.3* 6.6 6.6 6.8  ALBUMIN 4.0 4.2 4.2 4.2    Assessment and Plan: Patient known to IR service from Port-A-Cath placement in 2016 and removal in 2020, left supraclavicular lymph node biopsy in 2019 and right thoracentesis in 2022.  She has a history of metastatic lung cancer diagnosed in 2016 as well as right breast DCIS diagnosed in 2022.  Latest posttreatment scans revealed:  Chest Impression:   1. Stable RIGHT suprahilar consolidation without interval growth. 2. Slight increase in RIGHT pleural effusion. 3. Stable sclerotic metastasis in the upper thoracic spine. Stable healed rib fractures. 4. New enlarged prominent LEFT axillary lymph nodes. Consider biopsy or FDG PET scan  to evaluate for malignant adenopathy.   Abdomen / Pelvis Impression:   1. No metastatic disease in the abdomen pelvis  She presents today for image guided left axillary lymph node biopsy for further evaluation. Risks and benefits of procedure was discussed with the patient  including, but not limited to bleeding, infection, damage to adjacent structures or low yield requiring additional tests.  All of the questions were answered and there is agreement to proceed.  Consent signed and in chart.    Electronically Signed: D. Rowe Robert, PA-C 12/17/2021, 11:52 AM   I spent a total of 20 minutes at the the patient's bedside AND on the patient's hospital floor or unit, greater than 50% of which was counseling/coordinating care for image guided left axillary lymph node biopsy

## 2021-12-22 LAB — SURGICAL PATHOLOGY

## 2021-12-24 ENCOUNTER — Telehealth: Payer: Self-pay | Admitting: Medical Oncology

## 2021-12-24 NOTE — Telephone Encounter (Signed)
Rose Figueroa said she had the biopsy and "it showed  breast cancer".  Asking about next steps . She needs a call back.

## 2021-12-24 NOTE — Telephone Encounter (Signed)
Unable to reach pt with this message:   Rubin Payor will talk to Dr Julien Nordmann on Tuesday about the next step. She also recommended for Chalon to contact her breast cancer oncologist if she remembers the name.

## 2021-12-27 ENCOUNTER — Telehealth: Payer: Self-pay | Admitting: Internal Medicine

## 2021-12-27 DIAGNOSIS — T451X5A Adverse effect of antineoplastic and immunosuppressive drugs, initial encounter: Secondary | ICD-10-CM | POA: Diagnosis not present

## 2021-12-27 DIAGNOSIS — M14672 Charcot's joint, left ankle and foot: Secondary | ICD-10-CM | POA: Diagnosis not present

## 2021-12-27 DIAGNOSIS — M79672 Pain in left foot: Secondary | ICD-10-CM | POA: Diagnosis not present

## 2021-12-27 NOTE — Telephone Encounter (Signed)
Called patient regarding upcoming June appointments, patient is notified.  

## 2021-12-30 ENCOUNTER — Telehealth: Payer: Self-pay | Admitting: Medical Oncology

## 2021-12-30 NOTE — Telephone Encounter (Signed)
Pt has appt June 23 with Dr Barry Dienes.

## 2022-01-04 ENCOUNTER — Other Ambulatory Visit: Payer: Self-pay | Admitting: Medical Oncology

## 2022-01-04 ENCOUNTER — Encounter: Payer: Self-pay | Admitting: Internal Medicine

## 2022-01-04 ENCOUNTER — Inpatient Hospital Stay: Payer: Medicare HMO | Admitting: Internal Medicine

## 2022-01-04 ENCOUNTER — Telehealth: Payer: Self-pay

## 2022-01-04 ENCOUNTER — Other Ambulatory Visit: Payer: Self-pay

## 2022-01-04 ENCOUNTER — Inpatient Hospital Stay: Payer: Medicare HMO | Attending: Internal Medicine

## 2022-01-04 VITALS — BP 153/89 | HR 101 | Temp 96.8°F | Resp 17 | Wt 157.5 lb

## 2022-01-04 DIAGNOSIS — I2510 Atherosclerotic heart disease of native coronary artery without angina pectoris: Secondary | ICD-10-CM | POA: Diagnosis not present

## 2022-01-04 DIAGNOSIS — G479 Sleep disorder, unspecified: Secondary | ICD-10-CM | POA: Diagnosis not present

## 2022-01-04 DIAGNOSIS — Z79899 Other long term (current) drug therapy: Secondary | ICD-10-CM | POA: Diagnosis not present

## 2022-01-04 DIAGNOSIS — C3491 Malignant neoplasm of unspecified part of right bronchus or lung: Secondary | ICD-10-CM | POA: Diagnosis not present

## 2022-01-04 DIAGNOSIS — Z923 Personal history of irradiation: Secondary | ICD-10-CM | POA: Insufficient documentation

## 2022-01-04 DIAGNOSIS — C3411 Malignant neoplasm of upper lobe, right bronchus or lung: Secondary | ICD-10-CM

## 2022-01-04 DIAGNOSIS — M79672 Pain in left foot: Secondary | ICD-10-CM | POA: Diagnosis not present

## 2022-01-04 DIAGNOSIS — E782 Mixed hyperlipidemia: Secondary | ICD-10-CM | POA: Insufficient documentation

## 2022-01-04 DIAGNOSIS — Z9221 Personal history of antineoplastic chemotherapy: Secondary | ICD-10-CM | POA: Diagnosis not present

## 2022-01-04 DIAGNOSIS — Z853 Personal history of malignant neoplasm of breast: Secondary | ICD-10-CM | POA: Insufficient documentation

## 2022-01-04 DIAGNOSIS — Z85828 Personal history of other malignant neoplasm of skin: Secondary | ICD-10-CM | POA: Diagnosis not present

## 2022-01-04 DIAGNOSIS — M8008XA Age-related osteoporosis with current pathological fracture, vertebra(e), initial encounter for fracture: Secondary | ICD-10-CM | POA: Insufficient documentation

## 2022-01-04 DIAGNOSIS — C7951 Secondary malignant neoplasm of bone: Secondary | ICD-10-CM | POA: Insufficient documentation

## 2022-01-04 DIAGNOSIS — C3431 Malignant neoplasm of lower lobe, right bronchus or lung: Secondary | ICD-10-CM | POA: Diagnosis not present

## 2022-01-04 DIAGNOSIS — R5383 Other fatigue: Secondary | ICD-10-CM | POA: Diagnosis not present

## 2022-01-04 DIAGNOSIS — R0609 Other forms of dyspnea: Secondary | ICD-10-CM | POA: Diagnosis not present

## 2022-01-04 DIAGNOSIS — C3490 Malignant neoplasm of unspecified part of unspecified bronchus or lung: Secondary | ICD-10-CM

## 2022-01-04 DIAGNOSIS — Z8719 Personal history of other diseases of the digestive system: Secondary | ICD-10-CM | POA: Insufficient documentation

## 2022-01-04 DIAGNOSIS — J90 Pleural effusion, not elsewhere classified: Secondary | ICD-10-CM | POA: Insufficient documentation

## 2022-01-04 DIAGNOSIS — G8929 Other chronic pain: Secondary | ICD-10-CM | POA: Insufficient documentation

## 2022-01-04 DIAGNOSIS — E559 Vitamin D deficiency, unspecified: Secondary | ICD-10-CM | POA: Diagnosis not present

## 2022-01-04 LAB — CBC WITH DIFFERENTIAL (CANCER CENTER ONLY)
Abs Immature Granulocytes: 0.01 10*3/uL (ref 0.00–0.07)
Basophils Absolute: 0 10*3/uL (ref 0.0–0.1)
Basophils Relative: 1 %
Eosinophils Absolute: 0.2 10*3/uL (ref 0.0–0.5)
Eosinophils Relative: 5 %
HCT: 38.3 % (ref 36.0–46.0)
Hemoglobin: 13.2 g/dL (ref 12.0–15.0)
Immature Granulocytes: 0 %
Lymphocytes Relative: 26 %
Lymphs Abs: 1.2 10*3/uL (ref 0.7–4.0)
MCH: 30.7 pg (ref 26.0–34.0)
MCHC: 34.5 g/dL (ref 30.0–36.0)
MCV: 89.1 fL (ref 80.0–100.0)
Monocytes Absolute: 0.6 10*3/uL (ref 0.1–1.0)
Monocytes Relative: 13 %
Neutro Abs: 2.5 10*3/uL (ref 1.7–7.7)
Neutrophils Relative %: 55 %
Platelet Count: 275 10*3/uL (ref 150–400)
RBC: 4.3 MIL/uL (ref 3.87–5.11)
RDW: 13.7 % (ref 11.5–15.5)
WBC Count: 4.5 10*3/uL (ref 4.0–10.5)
nRBC: 0 % (ref 0.0–0.2)

## 2022-01-04 LAB — CMP (CANCER CENTER ONLY)
ALT: 15 U/L (ref 0–44)
AST: 21 U/L (ref 15–41)
Albumin: 4 g/dL (ref 3.5–5.0)
Alkaline Phosphatase: 49 U/L (ref 38–126)
Anion gap: 7 (ref 5–15)
BUN: 12 mg/dL (ref 8–23)
CO2: 26 mmol/L (ref 22–32)
Calcium: 9.4 mg/dL (ref 8.9–10.3)
Chloride: 104 mmol/L (ref 98–111)
Creatinine: 0.65 mg/dL (ref 0.44–1.00)
GFR, Estimated: >60 mL/min (ref >60)
Glucose, Bld: 100 mg/dL — ABNORMAL HIGH (ref 70–99)
Potassium: 4.2 mmol/L (ref 3.5–5.1)
Sodium: 137 mmol/L (ref 135–145)
Total Bilirubin: 0.4 mg/dL (ref 0.3–1.2)
Total Protein: 6.7 g/dL (ref 6.5–8.1)

## 2022-01-04 MED ORDER — LIDOCAINE 5 % EX PTCH: 1.0000 | MEDICATED_PATCH | CUTANEOUS | 0 refills | Status: AC

## 2022-01-04 NOTE — Progress Notes (Signed)
Rose [phone]   [phone]  OFFICE PROGRESS NOTE  Rose Bishop, DO [address] [place] [id number]  DIAGNOSIS:  1) Stage IIIB (T2a, N3, M0) non-small cell lung cancer, adenocarcinoma with negative EGFR mutation and negative ALK gene translocation diagnosed in October 2016. 2) right breast ductal carcinoma in situ diagnosed in December 2022.  Biomarker Findings Microsatellite status - MS-Stable Tumor Mutational Burden - TMB-Low (5 Muts/Mb) Genomic Findings For a complete list of the genes assayed, please refer to the Appendix. ROS1 SDC4-ROS1 fusion RBM10 N549f*14 TP53 R273C 7 Disease relevant genes with no reportable alterations: EGFR, KRAS, ALK, BRAF, MET, RET, ERBB2  PRIOR THERAPY:  1) Concurrent chemoradiation with weekly carboplatin for AUC of 2 and paclitaxel 45 MG/M2 status post 6 cycles. Last dose was given 07/13/2015 with partial response. 2) Systemic chemotherapy with carboplatin for AUC of 5 and Alimta 500 MG/M2 every 3 weeks. First dose 11/02/2015. Status post 6 cycles. 3) palliative stereotactic radiotherapy to the left supraclavicular lymph node under the care of Dr. MTammi Klippel  Loss of fraction of radiotherapy scheduled for November 01, 2017 4) palliative radiotherapy to the left supraclavicular lymph node as well as left upper lobe lung mass completed May 04, 2020 under the care of Dr. MTammi Klippel  CURRENT THERAPY: Entrectinib 600 mg p.o. daily.  Started April 23, 2021.  Her dose was reduced to 400 mg p.o. daily.  Status post 7 months of treatment.  Her treatment has been on hold since October 25, 2021 secondary to intolerance  INTERVAL HISTORY: [name] returns to the clinic today for follow-up visit.  The patient continues to have pain on the left foot from nonhealing fracture and she is still wearing a boot.  She denied having any current chest pain but has chronic back pain and  she is requiring lidocaine patch.  She has no shortness of breath except with exertion no cough or hemoptysis.  She has no nausea, vomiting, diarrhea or constipation.  She has no headache or visual changes.  She denied having any recent weight loss or night sweats.  She underwent ultrasound-guided core biopsy of the left axillary lymph node and the final pathology was consistent with metastatic carcinoma of breast primary.  The patient is here today for evaluation and recommendation regarding her condition.  She has been off treatment with Entrectinib since October 25, 2021 secondary to intolerance.   MEDICAL HISTORY: Past Medical History:  Diagnosis Date   Age related osteoporosis 12/18/2020   Anxiety    Atherosclerosis 12/18/2020   Back pain    right back lateral   BCC (basal cell carcinoma of skin)    Bone metastasis 10/04/2017   [place]   Cardiac murmur 04/24/2020   Chest discomfort 04/24/2020   Chronic pain 12/18/2020   Colon polyps    Complication of anesthesia    pt. states that she is difficult to arouse   Coronary atherosclerosis 04/24/2020   Diverticulosis    Dysrhythmia    "skip beat in early 20's"   Encounter for antineoplastic chemotherapy 06/08/2015   Generalized rash 06/24/2015   H/O cold sores    History of anemia    Lung cancer (HCC)    RUL mass -being tx- radiation and chemo- remains last chemo 01-04-16, 2023 left lung Ca   Lung field abnormal 12/18/2020   Metastasis to supraclavicular lymph node ([facility]  10/04/2017   Metastatic non-small cell lung cancer ([place]) 03/24/2020   Mixed dyslipidemia 04/24/2020   Oral lesion 06/24/2015   Osteoporosis    Personal history of colonic polyps 12/18/2020   PONV (postoperative nausea and vomiting)    Port catheter in place 11/20/2015   Rectal bleeding    right upper lobe lung mass 04/15/2015   malignant tumor found - 10'16-radiation and chemotherapy and remains with chemo at present- Dr.  Earlie Server follows.   Sciatica 12/18/2020   Vitamin D deficiency 12/18/2020    ALLERGIES:  is allergic to latex and sod picosulfate-mag ox-cit acd.  MEDICATIONS:  Current Outpatient Medications  Medication Sig Dispense Refill   Acetylcysteine (NAC 600) 600 MG CAPS Take 600 mg by mouth daily.     albuterol (VENTOLIN HFA) 108 (90 Base) MCG/ACT inhaler Inhale 2 puffs into the lungs every 6 (six) hours as needed for wheezing or shortness of breath.     atorvastatin (LIPITOR) 10 MG tablet TAKE 1 TABLET BY MOUTH EVERY DAY 30 tablet 1   benzonatate (TESSALON) 200 MG capsule Take 1 capsule (200 mg total) by mouth 3 (three) times daily as needed for cough. 20 capsule 0   Biotin 10000 MCG TABS Take 10,000 mcg by mouth daily.     Calcium Carb-Cholecalciferol (CALCIUM 1000 + D PO) Take 1 tablet by mouth 2 (two) times daily. 1000 mg /1200 mg     cetirizine (ZYRTEC) 10 MG tablet Take 10 mg by mouth daily.     denosumab (PROLIA) 60 MG/ML SOSY injection Inject 60 mg into the skin every 6 (six) months.     L-Lysine 1000 MG TABS Take 1,000 mg by mouth daily.      Melatonin 3 MG CAPS Take 3 mg by mouth at bedtime.     Multiple Vitamins-Minerals (PRESERVISION AREDS) CAPS Take 1 capsule by mouth daily.     nitroGLYCERIN (NITROSTAT) 0.4 MG SL tablet Place 0.4 mg under the tongue every 5 (five) minutes as needed for chest pain. (Patient not taking: Reported on 07/15/2021)     Omega-3 Fatty Acids (RA FISH OIL PO) Take 1,200 mg by mouth daily.     PREBIOTIC PRODUCT PO Take 1 tablet by mouth daily. culterelle     Psyllium (REGULOID PO) Take 750 mg by mouth daily as needed (Constipation).     Turmeric Curcumin 500 MG CAPS Take 500 mg by mouth daily.     vitamin B-12 (CYANOCOBALAMIN) 1000 MCG tablet Take 1,000 mcg by mouth daily.     No current facility-administered medications for this visit.    SURGICAL HISTORY:  Past Surgical History:  Procedure Laterality Date   BREAST EXCISIONAL BIOPSY Right    BREAST  EXCISIONAL BIOPSY Right    BREAST EXCISIONAL BIOPSY Right    BREAST LUMPECTOMY WITH RADIOACTIVE SEED LOCALIZATION Right 07/28/2021   Procedure: RIGHT BREAST LUMPECTOMY WITH RADIOACTIVE SEED LOCALIZATION X2;  Surgeon: Stark Klein, MD;  Location: [facility];  Service: General;  Laterality: Right;  60 MINUTES ROOM 2   COLONOSCOPY     COLONOSCOPY W/ POLYPECTOMY     COLONOSCOPY WITH PROPOFOL N/A 01/20/2016   Procedure: COLONOSCOPY WITH PROPOFOL;  Surgeon: Arta Silence, MD;  Location: [facility];  Service: Endoscopy;  Laterality: N/A;   CYSTECTOMY     right breast x 2   IR REMOVAL TUN ACCESS W/ PORT W/O FL MOD SED  12/12/2018   MEDIASTINOSCOPY N/A 05/07/2015   Procedure: MEDIASTINOSCOPY;  Surgeon: Ivin Poot, MD;  Location: [facility];  Service: Thoracic;  Laterality: N/A;   TONSILLECTOMY     VIDEO BRONCHOSCOPY WITH ENDOBRONCHIAL ULTRASOUND N/A 05/07/2015   Procedure: VIDEO BRONCHOSCOPY WITH ENDOBRONCHIAL ULTRASOUND;  Surgeon: Ivin Poot, MD;  Location: [facility];  Service: Thoracic;  Laterality: N/A;    REVIEW OF SYSTEMS:  Constitutional: positive for fatigue Eyes: negative Ears, nose, mouth, throat, and face: negative Respiratory: positive for dyspnea on exertion Cardiovascular: negative Gastrointestinal: negative Genitourinary:negative Integument/breast: negative Hematologic/lymphatic: negative Musculoskeletal:positive for back pain Neurological: negative Behavioral/Psych: positive for sleep disturbance Endocrine: negative Allergic/Immunologic: negative   PHYSICAL EXAMINATION: General appearance: alert, cooperative, fatigued and no distress Head: Normocephalic, without obvious abnormality, atraumatic Neck: no adenopathy, no JVD, supple, symmetrical, trachea midline and thyroid not enlarged, symmetric, no tenderness/mass/nodules Lymph nodes: Cervical, supraclavicular, and axillary nodes normal. Resp: clear to auscultation bilaterally Back: symmetric, no curvature. ROM normal. No CVA  tenderness. Cardio: regular rate and rhythm, S1, S2 normal, no murmur, click, rub or gallop GI: soft, non-tender; bowel sounds normal; no masses,  no organomegaly Extremities: extremities normal, atraumatic, no cyanosis or edema Neurological exam unremarkable  ECOG PERFORMANCE STATUS: 1 - Symptomatic but completely ambulatory  Blood pressure (!) 153/89, pulse (!) 101, temperature (!) 96.8 F (36 C), temperature source Oral, resp. rate 17, weight 157 lb 8 oz (71.4 kg), SpO2 97 %.  LABORATORY DATA: Lab Results  Component Value Date   WBC 4.5 01/04/2022   HGB 13.2 01/04/2022   HCT 38.3 01/04/2022   MCV 89.1 01/04/2022   PLT 275 01/04/2022      Chemistry      Component Value Date/Time   NA 140 12/07/2021 1031   NA 139 03/20/2017 1038   K 4.4 12/07/2021 1031   K 4.3 03/20/2017 1038   CL 105 12/07/2021 1031   CO2 30 12/07/2021 1031   CO2 27 03/20/2017 1038   BUN 14 12/07/2021 1031   BUN 15.4 03/20/2017 1038   CREATININE 0.70 12/07/2021 1031   CREATININE 0.7 03/20/2017 1038      Component Value Date/Time   CALCIUM 9.4 12/07/2021 1031   CALCIUM 9.8 03/20/2017 1038   ALKPHOS 58 12/07/2021 1031   ALKPHOS 37 (L) 03/20/2017 1038   AST 30 12/07/2021 1031   AST 25 03/20/2017 1038   ALT 44 12/07/2021 1031   ALT 22 03/20/2017 1038   BILITOT 0.4 12/07/2021 1031   BILITOT 0.42 03/20/2017 1038       RADIOGRAPHIC STUDIES: [place] CORE BIOPSY (LYMPH NODES)  Result Date: 12/17/2021 INDICATION: History of breast cancer, mildly enlarged left axillary adenopathy EXAM: ULTRASOUND GUIDED CORE BIOPSY OF LEFT AXILLARY ADENOPATHY MEDICATIONS: 1% lidocaine local ANESTHESIA/SEDATION: Versed 1.25m IV; Fentanyl 589m IV; Moderate Sedation Time:  11 minutes The patient was continuously monitored during the procedure by the interventional radiology nurse under my direct supervision. FLUOROSCOPY: Fluoroscopy Time: None. COMPLICATIONS: None immediate. PROCEDURE: The procedure, risks, benefits, and  alternatives were explained to the patient. Questions regarding the procedure were encouraged and answered. The patient understands and consents to the procedure. Previous imaging reviewed. Left axillary adenopathy was localized and marked. This was correlated with the prior CT. Under sterile conditions and local anesthesia, an 18 gauge core biopsy was advanced to the left axillary adenopathy. 18 gauge core biopsies obtained under direct ultrasound. Approximate 5 1 cm core biopsies obtained. Samples were intact and placed in saline. Postprocedure imaging demonstrates no hemorrhage or hematoma. Patient tolerated biopsy well. Images obtained for documentation. FINDINGS: Imaging confirms needle placement in the left axillary adenopathy for 18 gauge  core biopsy IMPRESSION: Successful ultrasound left axillary adenopathy 18 gauge core biopsies Electronically Signed   By: Jerilynn Mages.  Shick M.D.   On: 12/17/2021 15:13   CT Chest W Contrast  Result Date: 12/08/2021 CLINICAL DATA:  Primary Cancer Type: Lung Imaging Indication: Assess response to therapy Interval therapy since last imaging? Yes Initial Cancer Diagnosis Date: 05/07/2015; Established by: Biopsy-proven Detailed Pathology: Stage IIIB non-small cell lung cancer, adenocarcinoma. Primary Tumor location:  Right upper lobe. Surgeries: No thoracic. Chemotherapy: Yes; Ongoing? No; Most recent administration: 2017 Immunotherapy? No Radiation therapy? Yes Date Range: 04/21/2020 - 05/04/2020; Target: left supraclavicular lymph node and left upper lobe Date Range: 10/19/2017 - 11/01/2017; Target: left chest and thoracic spine Date Range: 06/01/2015 - 07/16/2015; Target: Right upper lobe Other Cancer Therapies: Entrectinib daily. Right breast lumpectomy with seed localization 07/28/2021 for right breast ductal carcinoma in situ. * Tracking Code: BO * EXAM: CT CHEST, ABDOMEN, AND PELVIS WITH CONTRAST TECHNIQUE: Multidetector CT imaging of the chest, abdomen and pelvis was  performed following the standard protocol during bolus administration of intravenous contrast. RADIATION DOSE REDUCTION: This exam was performed according to the departmental dose-optimization program which includes automated exposure control, adjustment of the mA and/or kV according to patient size and/or use of iterative reconstruction technique. CONTRAST:  100 mL OMNIPAQUE IOHEXOL 300 MG/ML  SOLN COMPARISON:  Most recent CT chest, abdomen and pelvis 09/14/2021. 04/01/2020 PET-CT. FINDINGS: CT CHEST FINDINGS Cardiovascular: Venous collaterals extending over the LEFT chest wall. No pulmonary embolism. Trace pericardial thickening/fluid. Mediastinum/Nodes: Soft tissue thickening in the RIGHT hilum is similar to prior. No discrete measurable lymph nodes. No supraclavicular adenopathy. Cluster of mildly enlarged LEFT axillary lymph nodes. The more superior nodes are unchanged (image 9 mm node on image 15/2 common for example). However, more inferior LEFT axillary node is enlarged (measuring 10 mm on image 21/2). Lungs/Pleura: Focus of rounded atelectasis/consolidation in the medial RIGHT suprahilar lung is unchanged (image 42/4. There is a moderate RIGHT effusion slightly increased in volume. No new measurable nodules in the RIGHT lung. No LEFT lung pulmonary nodules. Musculoskeletal: Healed bilateral rib fractures. Chronic compression fracture at T6 with sclerosis. Sclerosis in the T2 vertebral body additionally. Unchanged from prior CT ABDOMEN AND PELVIS FINDINGS Hepatobiliary: Several small hypodense lesions in liver unchanged from prior gallbladder normal. Pancreas: Pancreas is normal. No ductal dilatation. No pancreatic inflammation. Spleen: Normal spleen Adrenals/urinary tract: Adrenal glands and kidneys are normal. The ureters and bladder normal. Stomach/Bowel: Stomach, small bowel, appendix, and cecum are normal. The colon and rectosigmoid colon are normal. Vascular/Lymphatic: Abdominal aorta is normal  caliber. There is no retroperitoneal or periportal lymphadenopathy. No pelvic lymphadenopathy. Reproductive: Uterus and adnexa unremarkable. Other: No free fluid. Musculoskeletal: No aggressive osseous lesion. IMPRESSION: Chest Impression: 1. Stable RIGHT suprahilar consolidation without interval growth. 2. Slight increase in RIGHT pleural effusion. 3. Stable sclerotic metastasis in the upper thoracic spine. Stable healed rib fractures. 4. New enlarged prominent LEFT axillary lymph nodes. Consider biopsy or FDG PET scan to evaluate for malignant adenopathy. Abdomen / Pelvis Impression: 1. No metastatic disease in the abdomen pelvis. Electronically Signed   By: Suzy Bouchard M.D.   On: 12/08/2021 11:26   CT Abdomen Pelvis W Contrast  Result Date: 12/08/2021 CLINICAL DATA:  Primary Cancer Type: Lung Imaging Indication: Assess response to therapy Interval therapy since last imaging? Yes Initial Cancer Diagnosis Date: 05/07/2015; Established by: Biopsy-proven Detailed Pathology: Stage IIIB non-small cell lung cancer, adenocarcinoma. Primary Tumor location:  Right upper lobe. Surgeries: No thoracic.  Chemotherapy: Yes; Ongoing? No; Most recent administration: 2017 Immunotherapy? No Radiation therapy? Yes Date Range: 04/21/2020 - 05/04/2020; Target: left supraclavicular lymph node and left upper lobe Date Range: 10/19/2017 - 11/01/2017; Target: left chest and thoracic spine Date Range: 06/01/2015 - 07/16/2015; Target: Right upper lobe Other Cancer Therapies: Entrectinib daily. Right breast lumpectomy with seed localization 07/28/2021 for right breast ductal carcinoma in situ. * Tracking Code: BO * EXAM: CT CHEST, ABDOMEN, AND PELVIS WITH CONTRAST TECHNIQUE: Multidetector CT imaging of the chest, abdomen and pelvis was performed following the standard protocol during bolus administration of intravenous contrast. RADIATION DOSE REDUCTION: This exam was performed according to the departmental dose-optimization program  which includes automated exposure control, adjustment of the mA and/or kV according to patient size and/or use of iterative reconstruction technique. CONTRAST:  100 mL OMNIPAQUE IOHEXOL 300 MG/ML  SOLN COMPARISON:  Most recent CT chest, abdomen and pelvis 09/14/2021. 04/01/2020 PET-CT. FINDINGS: CT CHEST FINDINGS Cardiovascular: Venous collaterals extending over the LEFT chest wall. No pulmonary embolism. Trace pericardial thickening/fluid. Mediastinum/Nodes: Soft tissue thickening in the RIGHT hilum is similar to prior. No discrete measurable lymph nodes. No supraclavicular adenopathy. Cluster of mildly enlarged LEFT axillary lymph nodes. The more superior nodes are unchanged (image 9 mm node on image 15/2 common for example). However, more inferior LEFT axillary node is enlarged (measuring 10 mm on image 21/2). Lungs/Pleura: Focus of rounded atelectasis/consolidation in the medial RIGHT suprahilar lung is unchanged (image 42/4. There is a moderate RIGHT effusion slightly increased in volume. No new measurable nodules in the RIGHT lung. No LEFT lung pulmonary nodules. Musculoskeletal: Healed bilateral rib fractures. Chronic compression fracture at T6 with sclerosis. Sclerosis in the T2 vertebral body additionally. Unchanged from prior CT ABDOMEN AND PELVIS FINDINGS Hepatobiliary: Several small hypodense lesions in liver unchanged from prior gallbladder normal. Pancreas: Pancreas is normal. No ductal dilatation. No pancreatic inflammation. Spleen: Normal spleen Adrenals/urinary tract: Adrenal glands and kidneys are normal. The ureters and bladder normal. Stomach/Bowel: Stomach, small bowel, appendix, and cecum are normal. The colon and rectosigmoid colon are normal. Vascular/Lymphatic: Abdominal aorta is normal caliber. There is no retroperitoneal or periportal lymphadenopathy. No pelvic lymphadenopathy. Reproductive: Uterus and adnexa unremarkable. Other: No free fluid. Musculoskeletal: No aggressive osseous  lesion. IMPRESSION: Chest Impression: 1. Stable RIGHT suprahilar consolidation without interval growth. 2. Slight increase in RIGHT pleural effusion. 3. Stable sclerotic metastasis in the upper thoracic spine. Stable healed rib fractures. 4. New enlarged prominent LEFT axillary lymph nodes. Consider biopsy or FDG PET scan to evaluate for malignant adenopathy. Abdomen / Pelvis Impression: 1. No metastatic disease in the abdomen pelvis. Electronically Signed   By: Suzy Bouchard M.D.   On: 12/08/2021 11:26    ASSESSMENT AND PLAN:  This is a very pleasant 67 years old white female with metastatic non-small cell lung cancer initially diagnosed as stage IIIB non-small cell lung cancer, adenocarcinoma with negative EGFR, ALK mutations diagnosed in October 2016.  Molecular studies showed positive ROS 1 She is status post course of concurrent chemoradiation followed by consolidation chemotherapy with carboplatin and Alimta. The patient has been in observation for close to 3 years. She underwent palliative radiotherapy to the left supraclavicular lymph nodes with improvement of her disease. She also underwent SBRT to left upper lobe lung nodule as well as left supraclavicular lymphadenopathy under the care of Dr. Tammi Klippel completed in October 2021. She was found on recent CT scan of the chest to have evidence for disease recurrence in addition to malignant right pleural  effusion. The patient started treatment with Entrectinib 600 mg p.o. daily and she has a lot of trouble with the treatment including fatigue, paresthesia, feeling over the most of the time and imbalance of her gait.  Her dose of Entrectinib was reduced to 400 mg p.o. daily several weeks ago and she has been tolerating the lower dose much better.  Her dose was even reduced to Entrectinib 200 mg p.o. daily but she continues to have issues with this medication with increasing fatigue and weakness as well as rash. She discontinued her treatment  completely on October 25, 2021 because of the poor quality of life. The patient was found on previous imaging study to have enlarging left axillary lymph node and this was biopsied and was consistent with metastatic carcinoma of breast primary. The patient is scheduled to see Dr. Barry Dienes next week for evaluation of this condition.  I will also arrange for her to see Dr. Lindi Adie for discussion of her treatment options for the breast cancer. For the metastatic lung cancer she is currently on observation after she discontinued her treatment with Entrectinib.  I will see her back for follow-up visit in 3 months for evaluation with repeat CT scan of the chest, abdomen and pelvis for restaging of her disease.  I discussed with the patient the option of treatment with Xalkori or Lorlatinib but she would like to hold on this option for now. For the chronic back pain I will give her prescription for lidocaine patch based on her request. For the insomnia she will try Advil PM. The patient was advised to call immediately if she has any other concerning symptoms in the interval. The patient voices understanding of current disease status and treatment options and is in agreement with the current care plan. All questions were answered. The patient knows to call the clinic with any problems, questions or concerns. We can certainly see the patient much sooner if necessary.  Disclaimer: This note was dictated with voice recognition software. Similar sounding words can inadvertently be transcribed and may not be corrected upon review.

## 2022-01-04 NOTE — Telephone Encounter (Signed)
Notified Patient of prior authorization approval for Lidocaine 5% Patches. Medication is approved through 07/24/2022. No other needs or concerns voiced at this time.

## 2022-01-07 ENCOUNTER — Telehealth: Payer: Self-pay | Admitting: Hematology and Oncology

## 2022-01-07 ENCOUNTER — Other Ambulatory Visit: Payer: Self-pay | Admitting: General Surgery

## 2022-01-07 DIAGNOSIS — Z853 Personal history of malignant neoplasm of breast: Secondary | ICD-10-CM

## 2022-01-07 NOTE — Telephone Encounter (Signed)
Called pt to sch appt per 6/16 staff msg from [employer]. Pt declined to sch at this time. She stated she wanted to meet with her surgeon first before scheduling any other appts. Msg sent to [facility] to let her know of pt's decision.

## 2022-01-11 ENCOUNTER — Encounter: Payer: Self-pay | Admitting: *Deleted

## 2022-01-13 DIAGNOSIS — M810 Age-related osteoporosis without current pathological fracture: Secondary | ICD-10-CM | POA: Diagnosis not present

## 2022-01-14 DIAGNOSIS — I890 Lymphedema, not elsewhere classified: Secondary | ICD-10-CM | POA: Diagnosis not present

## 2022-01-14 DIAGNOSIS — C3490 Malignant neoplasm of unspecified part of unspecified bronchus or lung: Secondary | ICD-10-CM | POA: Diagnosis not present

## 2022-01-14 DIAGNOSIS — C50912 Malignant neoplasm of unspecified site of left female breast: Secondary | ICD-10-CM | POA: Diagnosis not present

## 2022-01-14 DIAGNOSIS — C773 Secondary and unspecified malignant neoplasm of axilla and upper limb lymph nodes: Secondary | ICD-10-CM | POA: Diagnosis not present

## 2022-01-17 ENCOUNTER — Telehealth: Payer: Self-pay | Admitting: Hematology and Oncology

## 2022-01-21 ENCOUNTER — Telehealth: Payer: Self-pay | Admitting: Cardiology

## 2022-01-21 ENCOUNTER — Ambulatory Visit: Payer: Medicare HMO

## 2022-01-21 ENCOUNTER — Ambulatory Visit
Admission: RE | Admit: 2022-01-21 | Discharge: 2022-01-21 | Disposition: A | Discharge status: Home / Self Care | Payer: Medicare HMO | Source: Ambulatory Visit | Attending: General Surgery | Admitting: General Surgery

## 2022-01-21 DIAGNOSIS — Z853 Personal history of malignant neoplasm of breast: Secondary | ICD-10-CM | POA: Diagnosis not present

## 2022-01-21 DIAGNOSIS — R928 Other abnormal and inconclusive findings on diagnostic imaging of breast: Secondary | ICD-10-CM | POA: Diagnosis not present

## 2022-01-21 NOTE — Telephone Encounter (Signed)
Left message for patient to call back  

## 2022-01-21 NOTE — Telephone Encounter (Signed)
Pt c/o Shortness Of Breath: STAT if SOB developed within the last 24 hours or pt is noticeably SOB on the phone  1. Are you currently SOB (can you hear that pt is SOB on the phone)? no  2. How long have you been experiencing SOB? About 2 weeks   3. Are you SOB when sitting or when up moving around? Moving around  4. Are you currently experiencing any other symptoms? Pt states that when she is bring groceries in, walking, or going up the stairs she has SOB. She states she has to constantly sit down and rest.   Pt made an appt to see Melina Copa PA, 01/26/22 at 10:55.

## 2022-01-21 NOTE — Telephone Encounter (Signed)
Pt returning a call.

## 2022-01-21 NOTE — Telephone Encounter (Signed)
Called patient to get more information regarding her shortness of breath. She reports that she has become more short of breath in the last two weeks especially with exertion. She does have an inhaler that she uses and she has waken up in the middle of the night and started coughing. She does have an appointment to see Melina Copa on 01/26/22 and feels that she would like to wait do anything until she see's Dayna next Wednesday. I encouraged her to go to the ER if the symptoms became worse. She stated that she would and she had no further questions at this time.

## 2022-01-24 ENCOUNTER — Other Ambulatory Visit: Payer: Self-pay | Admitting: General Surgery

## 2022-01-24 ENCOUNTER — Encounter: Payer: Self-pay | Admitting: *Deleted

## 2022-01-24 DIAGNOSIS — C50912 Malignant neoplasm of unspecified site of left female breast: Secondary | ICD-10-CM

## 2022-01-24 DIAGNOSIS — Z170 Estrogen receptor positive status [ER+]: Secondary | ICD-10-CM

## 2022-01-24 DIAGNOSIS — C3490 Malignant neoplasm of unspecified part of unspecified bronchus or lung: Secondary | ICD-10-CM

## 2022-01-25 ENCOUNTER — Encounter: Payer: Self-pay | Admitting: Physician Assistant

## 2022-01-25 NOTE — Progress Notes (Unsigned)
Cardiology Office Note    Date:  01/26/2022   ID:  Rose Figueroa, DOB [birth year], MRN [id number]  PCP:  Lyman Bishop, DO  Cardiologist:  Jenean Lindau, MD  Electrophysiologist:  None   Chief Complaint: shortness of breath with exertion  History of Present Illness:   Rose Figueroa is a 66 y.o. female with history of CAD per Dr. Julien Nordmann note (coronary calcification), non-small cell lung CA, breast cancer, Charcot joint, mild-moderate TR, dyslipidemia, anxiety, atherosclerosis, lymphedema of LUE, anemia, sciatica who is seen for evaluation of SOB.   Dr. Geraldo Pitter previously pursued workup for coronary calcification with stress test in 04/2020 that was normal. Also had echo at that time with EF 60-65%, normal RV, mild-moderate TR, normal PASP. She has history of lung CA metastasized to left supraclavicular node with prior chemo/radiation. She was also recently diagnosed breast cancer. It appears this was diagnosed 05/2021 on the right, DCIS, treated with lumpectomy 07/2021. CT 11/2021 showed right suprahilar consolidation without interval growth, slight increase in right pleural effusion, stable sclerotic metastasis in the upper thoracic spine, stable healed rib fractures, new enlarged prominent left axillary lymph nodes. Oncology note references that the CT was felt to reflect disease recurrence and malignant pleural effusion. It apears she underwent biopsy of these and the final pathology was with metastatic carcinoma of breast primary. Surgery note indicates this is felt to be a left sided breast CA, so further imaging is planned along with referral back to oncology. The general surgery team was also helping to manage her LUE lymphedema which was felt related to her breast cancer.  She called in requesting an appointment due to exertional dyspnea. She first noticed this about 2 1/2 weeks ago after driving to [place]. She initially attributed it to the [place] heat/humidity but it  persisted even upon return. It is particularly noticeable when she walks, relieved with rest. She has not had any overt chest pain. She also noticed increased dyspnea with recumbency. She does note that for the last several days this is not as pronounced as it was when it first started. When it first occurred she had to stop and rest frequently while walking - now she can walk a bit further before having to rest, but is not yet back to baseline. She is not SOB upright at rest. She reports she originally gained weight with her last onc med regimen but has since lost about 10 of it, though no major weight changes peripheral to this change in breathing. She continues to have LUE edema compatible with the known lymphedema diagnosis. She has also noticed enlarging lymph node to the back of her left neck as well as some enlargement in her left supraclavicular note. She sees oncology on Friday (Dr. Lindi Adie). Dr. Julien Nordmann is her usual oncologist. She does report a history of requiring thoracentesis in the past. No lower extremity edema.    Labwork independently reviewed: 12/2021 CBC wnl, K 4.2, Cr 0.65, LFTs wnl 12/2020 LDL 84, trig 55  Cardiology Studies:   Studies reviewed are outlined and summarized above. Reports included below if pertinent.   NST 04/2020 The left ventricular ejection fraction is hyperdynamic (>65%). Nuclear stress EF: 77%. There was no ST segment deviation noted during stress. The study is normal. This is a low risk study.  Echo 04/2020    1. Left ventricular ejection fraction, by estimation, is 60 to 65%. The  left ventricle has normal function. The left ventricle has no regional  wall motion abnormalities. Left ventricular diastolic parameters are  indeterminate. The average left  ventricular global longitudinal strain is -12.2 %.   2. Right ventricular systolic function is normal. The right ventricular  size is normal. There is normal pulmonary artery systolic pressure.   3.  The mitral valve is normal in structure. Trivial mitral valve  regurgitation. No evidence of mitral stenosis.   4. Tricuspid valve regurgitation is mild to moderate.   5. The aortic valve is normal in structure. Aortic valve regurgitation is  not visualized. No aortic stenosis is present.     Past Medical History:  Diagnosis Date   Age related osteoporosis 12/18/2020   Anxiety    Atherosclerosis 12/18/2020   Back pain    right back lateral   BCC (basal cell carcinoma of skin)    Bone metastasis 10/04/2017   Cancer of upper lobe of right lung ([place]) 05/19/2015   Chronic pain 12/18/2020   Colon polyps    Complication of anesthesia    pt. states that she is difficult to arouse   Coronary artery calcification of native artery    Coronary atherosclerosis 04/24/2020   Diverticulosis    Dysrhythmia    "skip beat in early 20's"   Encounter for antineoplastic chemotherapy 06/08/2015   Generalized rash 06/24/2015   H/O cold sores    History of anemia    Lung cancer (HCC)    RUL mass -being tx- radiation and chemo- remains last chemo 01-04-16, 2023 left lung Ca   Lung field abnormal 12/18/2020   Metastasis to supraclavicular lymph node ([place]) 10/04/2017   Metastatic non-small cell lung cancer ([facility]) 03/24/2020   Mixed dyslipidemia 04/24/2020   Oral lesion 06/24/2015   Osteoporosis    Personal history of colonic polyps 12/18/2020   PONV (postoperative nausea and vomiting)    Port catheter in place 11/20/2015   Rectal bleeding    right upper lobe lung mass 04/15/2015   malignant tumor found - 10'16-radiation and chemotherapy and remains with chemo at present- Dr. Earlie Server follows.   Sciatica 12/18/2020   Tricuspid regurgitation    Vitamin D deficiency 12/18/2020    Past Surgical History:  Procedure Laterality Date   BREAST EXCISIONAL BIOPSY Right    BREAST EXCISIONAL BIOPSY Right    BREAST EXCISIONAL BIOPSY Right    BREAST LUMPECTOMY WITH RADIOACTIVE SEED LOCALIZATION Right  07/28/2021   Procedure: RIGHT BREAST LUMPECTOMY WITH RADIOACTIVE SEED LOCALIZATION X2;  Surgeon: Stark Klein, MD;  Location: [facility];  Service: General;  Laterality: Right;  60 MINUTES ROOM 2   COLONOSCOPY     COLONOSCOPY W/ POLYPECTOMY     COLONOSCOPY WITH PROPOFOL N/A 01/20/2016   Procedure: COLONOSCOPY WITH PROPOFOL;  Surgeon: Arta Silence, MD;  Location: [facility];  Service: Endoscopy;  Laterality: N/A;   CYSTECTOMY     right breast x 2   IR REMOVAL TUN ACCESS W/ PORT W/O FL MOD SED  12/12/2018   MEDIASTINOSCOPY N/A 05/07/2015   Procedure: MEDIASTINOSCOPY;  Surgeon: Ivin Poot, MD;  Location: [facility];  Service: Thoracic;  Laterality: N/A;   TONSILLECTOMY     VIDEO BRONCHOSCOPY WITH ENDOBRONCHIAL ULTRASOUND N/A 05/07/2015   Procedure: VIDEO BRONCHOSCOPY WITH ENDOBRONCHIAL ULTRASOUND;  Surgeon: Ivin Poot, MD;  Location: [facility];  Service: Thoracic;  Laterality: N/A;    Current Medications: Current Meds  Medication Sig   Acetylcysteine (NAC 600) 600 MG CAPS Take 600 mg by mouth daily.   albuterol (VENTOLIN HFA) 108 (90 Base) MCG/ACT inhaler  Inhale 2 puffs into the lungs every 6 (six) hours as needed for wheezing or shortness of breath.   atorvastatin (LIPITOR) 10 MG tablet TAKE 1 TABLET BY MOUTH EVERY DAY   benzonatate (TESSALON) 200 MG capsule Take 1 capsule (200 mg total) by mouth 3 (three) times daily as needed for cough.   Biotin 10000 MCG TABS Take 10,000 mcg by mouth daily.   Calcium Carb-Cholecalciferol (CALCIUM 1000 + D PO) Take 1 tablet by mouth 2 (two) times daily. 1000 mg /1200 mg   cetirizine (ZYRTEC) 10 MG tablet Take 10 mg by mouth daily.   denosumab (PROLIA) 60 MG/ML SOSY injection Inject 60 mg into the skin every 6 (six) months.   L-Lysine 1000 MG TABS Take 1,000 mg by mouth daily.    lidocaine (LIDODERM) 5 % Place 1 patch onto the skin daily. Remove & Discard patch within 12 hours or as directed by MD   Melatonin 3 MG CAPS Take 3 mg by mouth at bedtime.    Multiple Vitamins-Minerals (PRESERVISION AREDS) CAPS Take 1 capsule by mouth daily.   nitroGLYCERIN (NITROSTAT) 0.4 MG SL tablet Place 0.4 mg under the tongue every 5 (five) minutes as needed for chest pain.   Omega-3 Fatty Acids (RA FISH OIL PO) Take 1,200 mg by mouth daily.   PREBIOTIC PRODUCT PO Take 1 tablet by mouth daily. culterelle   Psyllium (REGULOID PO) Take 750 mg by mouth daily as needed (Constipation).   Turmeric Curcumin 500 MG CAPS Take 500 mg by mouth daily.   vitamin B-12 (CYANOCOBALAMIN) 1000 MCG tablet Take 1,000 mcg by mouth daily.      Allergies:   Latex and Sod picosulfate-mag ox-cit acd   Social History   Socioeconomic History   Marital status: Married    Spouse name: Not on file   Number of children: Not on file   Years of education: Not on file   Highest education level: Not on file  Occupational History   Occupation: retired  Tobacco Use   Smoking status: Former    Packs/day: 1.00    Years: 5.00    Total pack years: 5.00    Types: Cigarettes    Quit date: 07/26/1975    Years since quitting: 46.5   Smokeless tobacco: Never  Vaping Use   Vaping Use: Never used  Substance and Sexual Activity   Alcohol use: Yes    Alcohol/week: 0.0 standard drinks of alcohol    Comment: occ-. none now.   Drug use: No   Sexual activity: Not on file  Other Topics Concern   Not on file  Social History Narrative   Not on file   Social Determinants of Health   Financial Resource Strain: Not on file  Food Insecurity: Not on file  Transportation Needs: Not on file  Physical Activity: Not on file  Stress: Not on file  Social Connections: Not on file     Family History:  The patient's family history includes Cancer in her father and maternal grandmother; Hypertension in her mother. There is no history of Breast cancer.  ROS:   Please see the history of present illness.  All other systems are reviewed and otherwise negative.    EKG(s)/Additional Labs   EKG:   EKG is ordered today, personally reviewed, demonstrating sinus tach 107bpm, low voltage QRS, no acute STT changes. Appears similar to prior.  Recent Labs: 01/04/2022: ALT 15; BUN 12; Creatinine 0.65; Hemoglobin 13.2; Platelet Count 275; Potassium 4.2; Sodium 137  Recent  Lipid Panel    Component Value Date/Time   CHOL 166 12/28/2020 1430   TRIG 55 12/28/2020 1430   HDL 71 12/28/2020 1430   CHOLHDL 2.3 12/28/2020 1430   LDLCALC 84 12/28/2020 1430    PHYSICAL EXAM:    VS:  BP 136/84   Pulse (!) 107   Ht 5\' 3"  (1.6 m)   Wt 158 lb (71.7 kg)   SpO2 95%   BMI 27.99 kg/m   BMI: Body mass index is 27.99 kg/m.  GEN: Well nourished, well developed female in no acute distress HEENT: normocephalic, atraumatic Neck: no JVD, carotid bruits. + left supraclavicular lymph node and posterior occipital lymph node Cardiac: RRR; no murmurs, rubs, or gallops, isolated LUE edema which is chronic for patient, no LE edema Respiratory:  diminished BS on right side 1/2 way up, clear to auscultation bilaterally on the left, no wheezing or rhonchi, normal work of breathing GI: soft, nontender, nondistended, + BS MS: no deformity or atrophy Skin: warm and dry, no rash Neuro:  Alert and Oriented x 3, Strength and sensation are intact, follows commands Psych: euthymic mood, full affect  Wt Readings from Last 3 Encounters:  01/26/22 158 lb (71.7 kg)  01/04/22 157 lb 8 oz (71.4 kg)  12/09/21 158 lb 4 oz (71.8 kg)     ASSESSMENT & PLAN:   1. Shortness of breath - exam is consistent with right sided pleural effusion. This was known from prior scan in 11/2021 but need to evaluate for any progression. Given her long car ride to [place] and malignancy, I also feel it is prudent to rule out PE. This will also help [place] screen for any sizable pericardial effusion. I have requested our nursing staff obtain stat CT angiogram today. She is hemodynamically stable in the office and not hypoxic. Will plan to communicate  results to oncology as well. May end up needing thoracentesis. Will also request expedited echocardiogram as well. As I suspect she has a primary pulmonary reason for her dyspnea, I will hold off on ischemic workup and start with the echo first from a cardiac standpoint. Check labs outlined in #2. ER precautions reviewed as well. Addendum: CT scan was not able to be scheduled until 6pm. I called the patient and recommended she proceed to the emergency room to expedite the workup and she politely declines. She prefers to continue with outpatient workup. She is aware to proceed to the ER if her symptoms worsen. I will plan to sign out to on-call APP to be aware of the pending report. If any significant findings on CT requiring more urgent intervention she understands that the recommendation may still be to proceed to the ER.  2. Sinus tachycardia - this appears intermittently chronic back to even 2016 at times. Will pursue workup above with CT and echo. CT will also help [place] screen for any significant pericardial effusion as well. She has no JVD on exam, hypotension or muffled heart songs. Hold off on treating with AVN blockers in case this is compensatory. Will also check CMET, CBC, BNP. I'd like to check her thyroid but she takes Biotin which can skew the results. I asked her to hold this 1 week before next visit at which time we'll obtain a TSH.  3. Coronary calcification, dyslipidemia - prior noninvasive evaluation in 2021 was reassuring. Start with pulm/onc workup as above, and revisit in follow-up. She has not had lipids checked in over a year. Will check fasting lipid profile when she  returns to next appointment fasting.  5. Tricuspid regurgitation - will await echocardiogram.    Disposition: F/u with myself in 2-3 weeks. Strongly encouraged her to keep onc follow-up on Friday and also to discuss her lymph node concerns with her team at that time.   Medication Adjustments/Labs and Tests  Ordered: Current medicines are reviewed at length with the patient today.  Concerns regarding medicines are outlined above. Medication changes, Labs and Tests ordered today are summarized above and listed in the Patient Instructions accessible in Encounters.   Signed, Charlie Pitter, PA-C  01/26/2022 11:11 AM    [place] Phone: [phone]; Fax: [phone]

## 2022-01-26 ENCOUNTER — Telehealth: Payer: Self-pay | Admitting: Physician Assistant

## 2022-01-26 ENCOUNTER — Ambulatory Visit: Payer: Medicare HMO | Admitting: Physician Assistant

## 2022-01-26 ENCOUNTER — Encounter: Payer: Self-pay | Admitting: Physician Assistant

## 2022-01-26 ENCOUNTER — Encounter: Payer: Self-pay | Admitting: *Deleted

## 2022-01-26 ENCOUNTER — Ambulatory Visit (HOSPITAL_BASED_OUTPATIENT_CLINIC_OR_DEPARTMENT_OTHER)
Admission: RE | Admit: 2022-01-26 | Discharge: 2022-01-26 | Disposition: A | Discharge status: Home / Self Care | Payer: Medicare HMO | Source: Ambulatory Visit | Attending: Physician Assistant | Admitting: Physician Assistant

## 2022-01-26 VITALS — BP 136/84 | HR 107 | Ht 63.0 in | Wt 158.0 lb

## 2022-01-26 DIAGNOSIS — I071 Rheumatic tricuspid insufficiency: Secondary | ICD-10-CM | POA: Diagnosis not present

## 2022-01-26 DIAGNOSIS — R0602 Shortness of breath: Secondary | ICD-10-CM | POA: Diagnosis not present

## 2022-01-26 DIAGNOSIS — I2584 Coronary atherosclerosis due to calcified coronary lesion: Secondary | ICD-10-CM | POA: Insufficient documentation

## 2022-01-26 DIAGNOSIS — E785 Hyperlipidemia, unspecified: Secondary | ICD-10-CM | POA: Insufficient documentation

## 2022-01-26 DIAGNOSIS — I2510 Atherosclerotic heart disease of native coronary artery without angina pectoris: Secondary | ICD-10-CM | POA: Diagnosis not present

## 2022-01-26 DIAGNOSIS — R000 Tachycardia, unspecified: Secondary | ICD-10-CM

## 2022-01-26 DIAGNOSIS — R079 Chest pain, unspecified: Secondary | ICD-10-CM | POA: Diagnosis not present

## 2022-01-26 DIAGNOSIS — M14672 Charcot's joint, left ankle and foot: Secondary | ICD-10-CM | POA: Diagnosis not present

## 2022-01-26 DIAGNOSIS — H1843 Other calcerous corneal degeneration: Secondary | ICD-10-CM

## 2022-01-26 MED ORDER — IOHEXOL 350 MG/ML SOLN
100.0000 mL | Freq: Once | INTRAVENOUS | Status: AC | PRN
Start: 2022-01-26 — End: 2022-01-26
  Administered 2022-01-26: 100 mL via INTRAVENOUS

## 2022-01-26 MED ORDER — IOHEXOL 350 MG/ML SOLN
50.0000 mL | Freq: Once | INTRAVENOUS | Status: AC | PRN
  Administered 2022-01-26: 50 mL via INTRAVENOUS

## 2022-01-26 NOTE — Telephone Encounter (Signed)
Called patient to discuss CT scan result.  No pulmonary embolism however interval increase in right pleural effusion to moderate to large.  Patient reports stable breathing.  Ordering provider likely is going to reach out to oncologist tomorrow to arrange thoracentesis.  Patient is expecting a call from our office or oncology.

## 2022-01-26 NOTE — Patient Instructions (Addendum)
Medication Instructions:  Your physician recommends that you continue on your current medications as directed. Please refer to the Current Medication list given to you today. *If you need a refill on your cardiac medications before your next appointment, please call your pharmacy*   Lab Work: TODAY- CMET, BNP, & CBC  ON SAME DAY AS ECHO FASTING LIPID & TSH (on same day as echo; HOLD Biotin on day you have do your lab work) If you have labs (blood work) drawn today and your tests are completely normal, you will receive your results only by: [place] (if you have MyChart) OR A paper copy in the mail If you have any lab test that is abnormal or we need to change your treatment, we will call you to review the results.   Testing/Procedures: CT ANGIO CHEST PULMONARY EMBOLISM (PE) W/ WO CONTRAST  Your physician has requested that you have an echocardiogram. Echocardiography is a painless test that uses sound waves to create images of your heart. It provides your doctor with information about the size and shape of your heart and how well your heart's chambers and valves are working. This procedure takes approximately one hour. There are no restrictions for this procedure.   Follow-Up: At [facility], you and your health needs are our priority.  As part of our continuing mission to provide you with exceptional heart care, we have created designated Provider Care Teams.  These Care Teams include your primary Cardiologist (physician) and Advanced Practice Providers (APPs -  Physician Assistants and Nurse Practitioners) who all work together to provide you with the care you need, when you need it.  We recommend signing up for the patient portal called "MyChart".  Sign up information is provided on this After Visit Summary.  MyChart is used to connect with patients for Virtual Visits (Telemedicine).  Patients are able to view lab/test results, encounter notes, upcoming appointments, etc.   Non-urgent messages can be sent to your provider as well.   To learn more about what you can do with MyChart, go to [email or web address].    Your next appointment:   2-3 week(s)  The format for your next appointment:   In Person  Provider:   Melina Copa, PA     Other Instructions   Important Information About Sugar

## 2022-01-27 ENCOUNTER — Encounter: Payer: Self-pay | Admitting: Internal Medicine

## 2022-01-27 ENCOUNTER — Ambulatory Visit (HOSPITAL_COMMUNITY)
Admission: RE | Admit: 2022-01-27 | Discharge: 2022-01-27 | Disposition: A | Discharge status: Home / Self Care | Payer: Medicare HMO | Source: Ambulatory Visit | Attending: Radiology | Admitting: Radiology

## 2022-01-27 ENCOUNTER — Telehealth: Payer: Self-pay

## 2022-01-27 ENCOUNTER — Ambulatory Visit (HOSPITAL_COMMUNITY)
Admission: RE | Admit: 2022-01-27 | Discharge: 2022-01-27 | Disposition: A | Discharge status: Home / Self Care | Payer: Medicare HMO | Source: Ambulatory Visit | Attending: Hematology and Oncology | Admitting: Hematology and Oncology

## 2022-01-27 ENCOUNTER — Other Ambulatory Visit: Payer: Self-pay

## 2022-01-27 VITALS — BP 145/92

## 2022-01-27 DIAGNOSIS — C3411 Malignant neoplasm of upper lobe, right bronchus or lung: Secondary | ICD-10-CM | POA: Diagnosis not present

## 2022-01-27 DIAGNOSIS — R0789 Other chest pain: Secondary | ICD-10-CM | POA: Diagnosis not present

## 2022-01-27 DIAGNOSIS — C3490 Malignant neoplasm of unspecified part of unspecified bronchus or lung: Secondary | ICD-10-CM | POA: Diagnosis not present

## 2022-01-27 DIAGNOSIS — J948 Other specified pleural conditions: Secondary | ICD-10-CM | POA: Diagnosis not present

## 2022-01-27 DIAGNOSIS — E559 Vitamin D deficiency, unspecified: Secondary | ICD-10-CM

## 2022-01-27 DIAGNOSIS — J90 Pleural effusion, not elsewhere classified: Secondary | ICD-10-CM | POA: Insufficient documentation

## 2022-01-27 HISTORY — PX: IR THORACENTESIS ASP PLEURAL SPACE W/IMG GUIDE: IMG5380

## 2022-01-27 LAB — GRAM STAIN

## 2022-01-27 LAB — PROTEIN, PLEURAL OR PERITONEAL FLUID: Total protein, fluid: 3.8 g/dL

## 2022-01-27 LAB — ALBUMIN, PLEURAL OR PERITONEAL FLUID: Albumin, Fluid: 2.6 g/dL

## 2022-01-27 LAB — GLUCOSE, PLEURAL OR PERITONEAL FLUID: Glucose, Fluid: 98 mg/dL

## 2022-01-27 MED ORDER — LIDOCAINE HCL (PF) 1 % IJ SOLN
INTRAMUSCULAR | Status: DC | PRN
  Administered 2022-01-27: 10 mL

## 2022-01-27 MED ORDER — LIDOCAINE HCL 1 % IJ SOLN
INTRAMUSCULAR | Status: AC
  Filled 2022-01-27: qty 20

## 2022-01-27 NOTE — Procedures (Signed)
PROCEDURE SUMMARY:  Successful US guided right thoracentesis. Yielded 1.7 L of clear yellow fluid. Pt tolerated procedure well. No immediate complications.  Specimen was sent for labs. CXR ordered.  EBL < 5 mL  Ascencion Dike PA-C 01/27/2022 4:23 PM

## 2022-01-27 NOTE — Telephone Encounter (Signed)
Incoming call from pt, informed pt of upcoming thora scheduled for today at 2:30, pt verbalized understanding and thanks

## 2022-01-27 NOTE — Progress Notes (Signed)
Urgent Thoracentesis  per MD

## 2022-01-27 NOTE — Telephone Encounter (Signed)
Reached out to Dr. Julien Nordmann with oncology team who looped in Dr. Lindi Adie. They are going to assist with arranging thoracentesis today. I greatly appreciate their help. Will otherwise document on CT/lab result note.

## 2022-01-28 ENCOUNTER — Other Ambulatory Visit: Payer: Medicare HMO

## 2022-01-28 ENCOUNTER — Other Ambulatory Visit: Payer: Self-pay

## 2022-01-28 ENCOUNTER — Encounter: Payer: Self-pay | Admitting: Internal Medicine

## 2022-01-28 ENCOUNTER — Telehealth: Payer: Self-pay | Admitting: Physician Assistant

## 2022-01-28 ENCOUNTER — Inpatient Hospital Stay: Payer: Medicare HMO | Attending: Internal Medicine | Admitting: Hematology and Oncology

## 2022-01-28 ENCOUNTER — Ambulatory Visit
Admission: RE | Admit: 2022-01-28 | Discharge: 2022-01-28 | Disposition: A | Discharge status: Home / Self Care | Payer: Medicare HMO | Source: Ambulatory Visit | Attending: General Surgery | Admitting: General Surgery

## 2022-01-28 DIAGNOSIS — C773 Secondary and unspecified malignant neoplasm of axilla and upper limb lymph nodes: Secondary | ICD-10-CM | POA: Diagnosis not present

## 2022-01-28 DIAGNOSIS — C50912 Malignant neoplasm of unspecified site of left female breast: Secondary | ICD-10-CM

## 2022-01-28 DIAGNOSIS — Z170 Estrogen receptor positive status [ER+]: Secondary | ICD-10-CM

## 2022-01-28 DIAGNOSIS — C770 Secondary and unspecified malignant neoplasm of lymph nodes of head, face and neck: Secondary | ICD-10-CM | POA: Diagnosis not present

## 2022-01-28 DIAGNOSIS — C50911 Malignant neoplasm of unspecified site of right female breast: Secondary | ICD-10-CM | POA: Diagnosis not present

## 2022-01-28 DIAGNOSIS — Z79899 Other long term (current) drug therapy: Secondary | ICD-10-CM | POA: Diagnosis not present

## 2022-01-28 DIAGNOSIS — Z87891 Personal history of nicotine dependence: Secondary | ICD-10-CM | POA: Insufficient documentation

## 2022-01-28 DIAGNOSIS — Z85118 Personal history of other malignant neoplasm of bronchus and lung: Secondary | ICD-10-CM | POA: Diagnosis not present

## 2022-01-28 DIAGNOSIS — Z809 Family history of malignant neoplasm, unspecified: Secondary | ICD-10-CM | POA: Diagnosis not present

## 2022-01-28 DIAGNOSIS — C3411 Malignant neoplasm of upper lobe, right bronchus or lung: Secondary | ICD-10-CM | POA: Insufficient documentation

## 2022-01-28 DIAGNOSIS — M7989 Other specified soft tissue disorders: Secondary | ICD-10-CM | POA: Diagnosis not present

## 2022-01-28 DIAGNOSIS — C3490 Malignant neoplasm of unspecified part of unspecified bronchus or lung: Secondary | ICD-10-CM

## 2022-01-28 LAB — COMPREHENSIVE METABOLIC PANEL
ALT: 16 IU/L (ref 0–32)
AST: 16 IU/L (ref 0–40)
Albumin/Globulin Ratio: 2.9 — ABNORMAL HIGH (ref 1.2–2.2)
Albumin: 4.1 g/dL (ref 3.8–4.8)
Alkaline Phosphatase: 64 IU/L (ref 44–121)
BUN/Creatinine Ratio: 22 (ref 12–28)
BUN: 14 mg/dL (ref 8–27)
Bilirubin Total: 0.2 mg/dL (ref 0.0–1.2)
CO2: 23 mmol/L (ref 20–29)
Calcium: 9.6 mg/dL (ref 8.7–10.3)
Chloride: 103 mmol/L (ref 96–106)
Creatinine, Ser: 0.64 mg/dL (ref 0.57–1.00)
Globulin, Total: 1.4 g/dL — ABNORMAL LOW (ref 1.5–4.5)
Glucose: 97 mg/dL (ref 70–99)
Potassium: 4.7 mmol/L (ref 3.5–5.2)
Sodium: 140 mmol/L (ref 134–144)
Total Protein: 5.5 g/dL — ABNORMAL LOW (ref 6.0–8.5)
eGFR: 97 mL/min/{1.73_m2} (ref >59)

## 2022-01-28 LAB — CBC
Hematocrit: 41.5 % (ref 34.0–46.6)
Hemoglobin: 13.5 g/dL (ref 11.1–15.9)
MCH: 29.6 pg (ref 26.6–33.0)
MCHC: 32.5 g/dL (ref 31.5–35.7)
MCV: 91 fL (ref 79–97)
Platelets: 356 10*3/uL (ref 150–450)
RBC: 4.56 x10E6/uL (ref 3.77–5.28)
RDW: 13 % (ref 11.7–15.4)
WBC: 5.4 10*3/uL (ref 3.4–10.8)

## 2022-01-28 LAB — PRO B NATRIURETIC PEPTIDE: NT-Pro BNP: 256 pg/mL (ref 0–301)

## 2022-01-28 MED ORDER — GADOBUTROL 1 MMOL/ML IV SOLN
7.0000 mL | Freq: Once | INTRAVENOUS | Status: AC | PRN
  Administered 2022-01-28: 7 mL via INTRAVENOUS

## 2022-01-28 NOTE — Telephone Encounter (Signed)
Pt is returning call in regards to results in the ECG 07/05 date. Requesting call back after after 2 p.m.

## 2022-01-28 NOTE — Telephone Encounter (Signed)
Left message for patient to call back  

## 2022-01-28 NOTE — Progress Notes (Signed)
[facility] NOTE  Patient Care Team: Masneri, Adele Barthel, DO as PCP - General (Family Medicine) Revankar, Reita Cliche, MD as PCP - Cardiology (Cardiology) Valrie Hart, RN as Oncology Nurse Navigator (Oncology) Rockwell Germany, RN as Oncology Nurse Navigator Mauro Kaufmann, RN as Oncology Nurse Navigator Nicholas Lose, MD as Consulting Physician (Hematology and Oncology)  CHIEF COMPLAINTS/PURPOSE OF CONSULTATION:  Newly diagnosed breast cancer  HISTORY OF PRESENTING ILLNESS:  Rose Figueroa 66 y.o. female is here because of recent diagnosis of {left/right:[id number]} breast cancer. She presents to the clinic for a consult.  I reviewed her records extensively and collaborated the history with the patient.  SUMMARY OF ONCOLOGIC HISTORY: Oncology History Overview Note  Patient presented in Sept with c/o chest tightness and difficulty breathing.  Work up CXR showed RUL infiltrate.  She was then followed with scans.   Cancer of upper lobe of right lung [facility])   Staging form: Lung, AJCC 7th Edition     Clinical stage from 05/19/2015: Stage IIIB (T2a, N3, M0) - Signed by Curt Bears, MD on 05/19/2015       Staging comments: Adenocarcinoma      Cancer of upper lobe of right lung ([place])  04/22/2015 Imaging   PET IMPRESSION: 1. Hypermetabolic right upper lobe mass with hypermetabolic ipsilateral hilar, mediastinal, subcarinal, internal mammary and right supraclavicular lymph nodes. Findings are most consistent with primary bronchogenic carcinoma    05/07/2015 Surgery   OPERATION: 1. Video bronchoscopy. 2. EBUS-endobronchial ultrasound-guided transbronchial biopsy of     mediastinal lymph node. 3. Mediastinoscopy and biopsy of mediastinal lymph nodes.     05/18/2015 Imaging   MRI Brain IMPRESSION: Negative brain MRI. No acute or metastatic intracranial abnormality   05/19/2015 Initial Diagnosis   Cancer of upper lobe of right lung ([place])   05/22/2015 -  Radiation  Therapy   SIM   06/01/2015 -  Chemotherapy   1st chemo   04/05/2021 -  Chemotherapy    Patient is on Treatment Plan: LUNG NSCLC ENTRECTINIB         MEDICAL HISTORY:  Past Medical History:  Diagnosis Date   Age related osteoporosis 12/18/2020   Anxiety    Atherosclerosis 12/18/2020   Back pain    right back lateral   BCC (basal cell carcinoma of skin)    Bone metastasis 10/04/2017   Cancer of upper lobe of right lung ([place]) 05/19/2015   Chronic pain 12/18/2020   Colon polyps    Complication of anesthesia    pt. states that she is difficult to arouse   Coronary artery calcification of native artery    Coronary atherosclerosis 04/24/2020   Diverticulosis    Dysrhythmia    "skip beat in early 20's"   Encounter for antineoplastic chemotherapy 06/08/2015   Generalized rash 06/24/2015   H/O cold sores    History of anemia    Lung cancer (HCC)    RUL mass -being tx- radiation and chemo- remains last chemo 01-04-16, 2023 left lung Ca   Lung field abnormal 12/18/2020   Metastasis to supraclavicular lymph node ([place]) 10/04/2017   Metastatic non-small cell lung cancer ([place]) 03/24/2020   Mixed dyslipidemia 04/24/2020   Oral lesion 06/24/2015   Osteoporosis    Personal history of colonic polyps 12/18/2020   PONV (postoperative nausea and vomiting)    Port catheter in place 11/20/2015   Rectal bleeding    right upper lobe lung mass 04/15/2015   malignant tumor found - 10'16-radiation  and chemotherapy and remains with chemo at present- Dr. Earlie Server follows.   Sciatica 12/18/2020   Tricuspid regurgitation    Vitamin D deficiency 12/18/2020    SURGICAL HISTORY: Past Surgical History:  Procedure Laterality Date   BREAST EXCISIONAL BIOPSY Right    BREAST EXCISIONAL BIOPSY Right    BREAST EXCISIONAL BIOPSY Right    BREAST LUMPECTOMY WITH RADIOACTIVE SEED LOCALIZATION Right 07/28/2021   Procedure: RIGHT BREAST LUMPECTOMY WITH RADIOACTIVE SEED LOCALIZATION X2;  Surgeon: Stark Klein, MD;  Location: [facility];  Service: General;  Laterality: Right;  60 MINUTES ROOM 2   COLONOSCOPY     COLONOSCOPY W/ POLYPECTOMY     COLONOSCOPY WITH PROPOFOL N/A 01/20/2016   Procedure: COLONOSCOPY WITH PROPOFOL;  Surgeon: Arta Silence, MD;  [facility];  Service: Endoscopy;  Laterality: N/A;   CYSTECTOMY     right breast x 2   IR REMOVAL TUN ACCESS W/ PORT W/O FL MOD SED  12/12/2018   IR THORACENTESIS ASP PLEURAL SPACE W/IMG GUIDE  01/27/2022   MEDIASTINOSCOPY N/A 05/07/2015   Procedure: MEDIASTINOSCOPY;  Surgeon: Ivin Poot, MD;  Location: [facility];  Service: Thoracic;  Laterality: N/A;   TONSILLECTOMY     VIDEO BRONCHOSCOPY WITH ENDOBRONCHIAL ULTRASOUND N/A 05/07/2015   Procedure: VIDEO BRONCHOSCOPY WITH ENDOBRONCHIAL ULTRASOUND;  Surgeon: Ivin Poot, MD;  [facility];  Service: Thoracic;  Laterality: N/A;    SOCIAL HISTORY: Social History   Socioeconomic History   Marital status: Married    Spouse name: Not on file   Number of children: Not on file   Years of education: Not on file   Highest education level: Not on file  Occupational History   Occupation: retired  Tobacco Use   Smoking status: Former    Packs/day: 1.00    Years: 5.00    Total pack years: 5.00    Types: Cigarettes    Quit date: 07/26/1975    Years since quitting: 46.5   Smokeless tobacco: Never  Vaping Use   Vaping Use: Never used  Substance and Sexual Activity   Alcohol use: Yes    Alcohol/week: 0.0 standard drinks of alcohol    Comment: occ-. none now.   Drug use: No   Sexual activity: Not on file  Other Topics Concern   Not on file  Social History Narrative   Not on file   Social Determinants of Health   Financial Resource Strain: Not on file  Food Insecurity: Not on file  Transportation Needs: Not on file  Physical Activity: Not on file  Stress: Not on file  Social Connections: Not on file  Intimate Partner Violence: Not on file    FAMILY HISTORY: Family  History  Problem Relation Age of Onset   Hypertension Mother    Cancer Father    Cancer Maternal Grandmother    Breast cancer Neg Hx     ALLERGIES:  is allergic to latex and sod picosulfate-mag ox-cit acd.  MEDICATIONS:  Current Outpatient Medications  Medication Sig Dispense Refill   Acetylcysteine (NAC 600) 600 MG CAPS Take 600 mg by mouth daily.     albuterol (VENTOLIN HFA) 108 (90 Base) MCG/ACT inhaler Inhale 2 puffs into the lungs every 6 (six) hours as needed for wheezing or shortness of breath.     atorvastatin (LIPITOR) 10 MG tablet TAKE 1 TABLET BY MOUTH EVERY DAY 30 tablet 1   benzonatate (TESSALON) 200 MG capsule Take 1 capsule (200 mg total) by mouth 3 (three)  times daily as needed for cough. 20 capsule 0   Biotin 10000 MCG TABS Take 10,000 mcg by mouth daily.     Calcium Carb-Cholecalciferol (CALCIUM 1000 + D PO) Take 1 tablet by mouth 2 (two) times daily. 1000 mg /1200 mg     cetirizine (ZYRTEC) 10 MG tablet Take 10 mg by mouth daily.     denosumab (PROLIA) 60 MG/ML SOSY injection Inject 60 mg into the skin every 6 (six) months.     L-Lysine 1000 MG TABS Take 1,000 mg by mouth daily.      lidocaine (LIDODERM) 5 % Place 1 patch onto the skin daily. Remove & Discard patch within 12 hours or as directed by MD 15 patch 0   Melatonin 3 MG CAPS Take 3 mg by mouth at bedtime.     Multiple Vitamins-Minerals (PRESERVISION AREDS) CAPS Take 1 capsule by mouth daily.     nitroGLYCERIN (NITROSTAT) 0.4 MG SL tablet Place 0.4 mg under the tongue every 5 (five) minutes as needed for chest pain.     Omega-3 Fatty Acids (RA FISH OIL PO) Take 1,200 mg by mouth daily.     PREBIOTIC PRODUCT PO Take 1 tablet by mouth daily. culterelle     Psyllium (REGULOID PO) Take 750 mg by mouth daily as needed (Constipation).     Turmeric Curcumin 500 MG CAPS Take 500 mg by mouth daily.     vitamin B-12 (CYANOCOBALAMIN) 1000 MCG tablet Take 1,000 mcg by mouth daily.     No current facility-administered  medications for this visit.    REVIEW OF SYSTEMS:   Constitutional: Denies fevers, chills or abnormal night sweats Breast: *** Denies any palpable lumps or discharge All other systems were reviewed with the patient and are negative.  PHYSICAL EXAMINATION: ECOG PERFORMANCE STATUS: {CHL ONC ECOG PS:[phone]}  There were no vitals filed for this visit. There were no vitals filed for this visit.  GENERAL:alert, no distress and comfortable BREAST:*** No palpable nodules in breast. No palpable axillary or supraclavicular lymphadenopathy (exam performed in the presence of a chaperone)   LABORATORY DATA:  I have reviewed the data as listed Lab Results  Component Value Date   WBC 5.4 01/26/2022   HGB 13.5 01/26/2022   HCT 41.5 01/26/2022   MCV 91 01/26/2022   PLT 356 01/26/2022   Lab Results  Component Value Date   NA 140 01/26/2022   K 4.7 01/26/2022   CL 103 01/26/2022   CO2 23 01/26/2022    RADIOGRAPHIC STUDIES: I have personally reviewed the radiological reports and agreed with the findings in the report.  ASSESSMENT AND PLAN:  No problem-specific Assessment & Plan notes found for this encounter.   All questions were answered. The patient knows to call the clinic with any problems, questions or concerns.    [place], CMA 01/28/22  I Gardiner Coins am scribing for Dr. Lindi Adie  ***

## 2022-01-29 ENCOUNTER — Encounter: Payer: Self-pay | Admitting: Internal Medicine

## 2022-01-29 DIAGNOSIS — C50912 Malignant neoplasm of unspecified site of left female breast: Secondary | ICD-10-CM | POA: Insufficient documentation

## 2022-01-29 NOTE — Assessment & Plan Note (Signed)
Stage IIIB (T2a, N3, M0) non-small cell lung cancer, adenocarcinoma with negative EGFR mutation and negative ALK gene translocation diagnosed in October 2016. 2) right breast ductal carcinoma in situ diagnosed in December 2022  PRIOR THERAPY:  1) Concurrent chemoradiation with weekly carboplatin for AUC of 2 and paclitaxel 45 MG/M2 status post 6 cycles. Last dose was given 07/13/2015 with partial response. 2) Systemic chemotherapy with carboplatin for AUC of 5 and Alimta 500 MG/M2 every 3 weeks. First dose 11/02/2015. Status post 6 cycles. 3) palliative stereotactic radiotherapy to the left supraclavicular lymph node under the care of Dr. Tammi Klippel.  Loss of fraction of radiotherapy scheduled for November 01, 2017 4) palliative radiotherapy to the left supraclavicular lymph node as well as left upper lobe lung mass completed May 04, 2020 under the care of Dr. Tammi Klippel.  CURRENT THERAPY: Entrectinib 600 mg p.o. daily.  Started April 23, 2021.  12/17/21: Left Axillary LN Biopsy: metastatic cancer weakly ER positive suggestive of Met breast cancer  01/28/22: Breast MRI: Left breast edema with skin thickening without abnormal enhancement, 5 left axillary LN  Plan: 1. Thoracentesis: Pleural fluid cytology is pending. If the cytology suggests lung cancer, then the treatment needs to be focused on met lung cancer. If its non diagnostic, I will request Dr.Byerly to get an excisional LN biopsy to make the diagnosis more accurate.

## 2022-01-31 ENCOUNTER — Encounter: Payer: Self-pay | Admitting: *Deleted

## 2022-01-31 NOTE — Telephone Encounter (Signed)
Returned call to Pt.  She was returning call, but states she is aware of CT results.  No further questions.

## 2022-01-31 NOTE — Telephone Encounter (Signed)
Patient returning call.

## 2022-02-01 ENCOUNTER — Telehealth: Payer: Self-pay | Admitting: Internal Medicine

## 2022-02-01 LAB — CULTURE, BODY FLUID W GRAM STAIN -BOTTLE: Culture: NO GROWTH

## 2022-02-01 NOTE — Telephone Encounter (Signed)
Called patient regarding upcoming September appointments, patient has been called and notified.

## 2022-02-02 ENCOUNTER — Ambulatory Visit: Payer: Medicare HMO | Attending: General Surgery | Admitting: Rehabilitation

## 2022-02-02 ENCOUNTER — Other Ambulatory Visit: Payer: Self-pay

## 2022-02-02 ENCOUNTER — Encounter: Payer: Self-pay | Admitting: Rehabilitation

## 2022-02-02 DIAGNOSIS — C3490 Malignant neoplasm of unspecified part of unspecified bronchus or lung: Secondary | ICD-10-CM | POA: Insufficient documentation

## 2022-02-02 DIAGNOSIS — M25612 Stiffness of left shoulder, not elsewhere classified: Secondary | ICD-10-CM | POA: Diagnosis not present

## 2022-02-02 DIAGNOSIS — I890 Lymphedema, not elsewhere classified: Secondary | ICD-10-CM | POA: Insufficient documentation

## 2022-02-02 DIAGNOSIS — M436 Torticollis: Secondary | ICD-10-CM | POA: Diagnosis not present

## 2022-02-02 NOTE — Therapy (Signed)
OUTPATIENT PHYSICAL THERAPY ONCOLOGY EVALUATION  Patient Name: Rose Figueroa MRN: [id number] DOB:1956-01-13, 66 y.o., female Today's Date: 02/02/2022   PT End of Session - 02/02/22 2147     Visit Number 1    Number of Visits 19    Date for PT Re-Evaluation 03/30/22    PT Start Time 1302    PT Stop Time 2440    PT Time Calculation (min) 50 min    Activity Tolerance Patient tolerated treatment well    Behavior During Therapy [facility] for tasks assessed/performed             Past Medical History:  Diagnosis Date   Age related osteoporosis 12/18/2020   Anxiety    Atherosclerosis 12/18/2020   Back pain    right back lateral   BCC (basal cell carcinoma of skin)    Bone metastasis 10/04/2017   Cancer of upper lobe of right lung ([place]) 05/19/2015   Chronic pain 12/18/2020   Colon polyps    Complication of anesthesia    pt. states that she is difficult to arouse   Coronary artery calcification of native artery    Coronary atherosclerosis 04/24/2020   Diverticulosis    Dysrhythmia    "skip beat in early 20's"   Encounter for antineoplastic chemotherapy 06/08/2015   Generalized rash 06/24/2015   H/O cold sores    History of anemia    Lung cancer (HCC)    RUL mass -being tx- radiation and chemo- remains last chemo 01-04-16, 2023 left lung Ca   Lung field abnormal 12/18/2020   Metastasis to supraclavicular lymph node ([place]) 10/04/2017   Metastatic non-small cell lung cancer ([place]) 03/24/2020   Mixed dyslipidemia 04/24/2020   Oral lesion 06/24/2015   Osteoporosis    Personal history of colonic polyps 12/18/2020   PONV (postoperative nausea and vomiting)    Port catheter in place 11/20/2015   Rectal bleeding    right upper lobe lung mass 04/15/2015   malignant tumor found - 10'16-radiation and chemotherapy and remains with chemo at present- Dr. Earlie Server follows.   Sciatica 12/18/2020   Tricuspid regurgitation    Vitamin D deficiency 12/18/2020   Past Surgical History:   Procedure Laterality Date   BREAST EXCISIONAL BIOPSY Right    BREAST EXCISIONAL BIOPSY Right    BREAST EXCISIONAL BIOPSY Right    BREAST LUMPECTOMY WITH RADIOACTIVE SEED LOCALIZATION Right 07/28/2021   Procedure: RIGHT BREAST LUMPECTOMY WITH RADIOACTIVE SEED LOCALIZATION X2;  Surgeon: Stark Klein, MD;  Location: [facility];  Service: General;  Laterality: Right;  60 MINUTES ROOM 2   COLONOSCOPY     COLONOSCOPY W/ POLYPECTOMY     COLONOSCOPY WITH PROPOFOL N/A 01/20/2016   Procedure: COLONOSCOPY WITH PROPOFOL;  Surgeon: Arta Silence, MD;  [facility];  Service: Endoscopy;  Laterality: N/A;   CYSTECTOMY     right breast x 2   IR REMOVAL TUN ACCESS W/ PORT W/O FL MOD SED  12/12/2018   IR THORACENTESIS ASP PLEURAL SPACE W/IMG GUIDE  01/27/2022   MEDIASTINOSCOPY N/A 05/07/2015   Procedure: MEDIASTINOSCOPY;  Surgeon: Ivin Poot, MD;  Location: [facility];  Service: Thoracic;  Laterality: N/A;   TONSILLECTOMY     VIDEO BRONCHOSCOPY WITH ENDOBRONCHIAL ULTRASOUND N/A 05/07/2015   Procedure: VIDEO BRONCHOSCOPY WITH ENDOBRONCHIAL ULTRASOUND;  Surgeon: Ivin Poot, MD;  Location: [facility] OR;  Service: Thoracic;  Laterality: N/A;   Patient Active Problem List   Diagnosis Date Noted   Breast cancer metastasized to axillary lymph node,  left ([place]) 01/29/2022   Atherosclerosis 12/18/2020   Chronic pain 12/18/2020   Lung field abnormal 12/18/2020   Personal history of colonic polyps 12/18/2020   Sciatica 12/18/2020   Vitamin D deficiency 12/18/2020   Age related osteoporosis 12/18/2020   Lung cancer ([place])    Anxiety    Back pain    BCC (basal cell carcinoma of skin)    Colon polyps    Complication of anesthesia    Diverticulosis    Dysrhythmia    H/O cold sores    History of anemia    Osteoporosis    PONV (postoperative nausea and vomiting)    Rectal bleeding    Coronary atherosclerosis 04/24/2020   Mixed dyslipidemia 04/24/2020   Cardiac murmur 04/24/2020   Chest discomfort  04/24/2020   Metastatic non-small cell lung cancer ([place]) 03/24/2020   Bone metastasis 10/04/2017   Metastasis to supraclavicular lymph node ([place]) 10/04/2017   Port catheter in place 11/20/2015   Generalized rash 06/24/2015   Oral lesion 06/24/2015   Encounter for antineoplastic chemotherapy 06/08/2015   Cancer of upper lobe of right lung ([place]) 05/19/2015   right upper lobe lung mass 04/15/2015    PCP: Dr. Crissie Sickles  REFERRING PROVIDER: Dr. Barry Dienes  REFERRING DIAG: I89.0 (ICD-10-CM) - Lymphedema, not elsewhere classified  THERAPY DIAG:    ONSET DATE: 11/2021  Rationale for Evaluation and Treatment Rehabilitation  SUBJECTIVE                                                                                                                                                                                           SUBJECTIVE STATEMENT: Treatment may be another chemotherapy but I dont know for sure.  Wearing a boot for a broken bone.  The swelling started around April or May.  It will fluctuate in size. Can be a bit worse with activity. I have had radiation on that side in the past.  The breast is also swollen.   PERTINENT HISTORY:  They think it is metastatic lung cancer not new breast cancer on the left side. Still processing imaging and has another MRI next week.  Hx of Rt lumpectomy with no LN removed on 07/28/21. Pt declined radiation.  she has been treated for right sided stage IIIB (T2aN3) non small cell lung cancer by Dr. Julien Nordmann. This was dx in 2016. She hs received chemo in 2016 and 2017 and radiation to the left supraclavicular node as well as the lung mass in 2019 and 2021 respectively.   Recent imaging showing left axillary lymphadenopathy with metastatic carcinoma on recent biopsy.  I had almost 2L pulled off of  my lung last week on the Rt side   PAIN:  Are you having pain? No Just tingling and cold in the hand occasionally   PRECAUTIONS: active cancer left axillary LNs of  unknown origin  WEIGHT BEARING RESTRICTIONS wearing boot all the time for Charcot foot  FALLS:  Has patient fallen in last 6 months? No  LIVING ENVIRONMENT: Lives with: lives with their spouse  OCCUPATION: not working  LEISURE: nothing extra  HAND DOMINANCE : right   PRIOR LEVEL OF FUNCTION: Independent  PATIENT GOALS what do I do with the swelling?   OBJECTIVE  COGNITION:  Overall cognitive status: Within functional limits for tasks assessed   PALPATION: Pitting not assessed today. Tightness in Left supraclavicular and cervical region from past radiation  OBSERVATIONS / OTHER ASSESSMENTS: Lt UE appears larger overall - left breast appears larger   POSTURE: stiff neck movements  UPPER EXTREMITY AROM/PROM:  A/PROM RIGHT   eval   Shoulder extension   Shoulder flexion 143  Shoulder abduction 165  Shoulder internal rotation   Shoulder external rotation     (Blank rows = not tested)  A/PROM LEFT   eval  Shoulder extension   Shoulder flexion 140  Shoulder abduction 165  Shoulder internal rotation   Shoulder external rotation     (Blank rows = not tested)   LYMPHEDEMA ASSESSMENTS:   LANDMARK RIGHT  eval   31  10 cm proximal to olecranon process 28.7  Olecranon process 25.5   23.7  10 cm proximal to ulnar styloid process 20.7  Just proximal to ulnar styloid process 15.1  Across hand at thumb web space 17.5  At base of 2nd digit 6.2  (Blank rows = not tested) 1886ml  LANDMARK LEFT  eval   33.5  10 cm proximal to olecranon process 31.5  Olecranon process 28.3   26.4  10 cm proximal to ulnar styloid process 23  Just proximal to ulnar styloid process 16.3  Across hand at thumb web space 18.3  At base of 2nd digit 6.0  (Blank rows = not tested) 9length 2257ml  Difference of 369ml  TODAY'S TREATMENT  Discussed CDT options including effectiveness with space occupying lesion if this is what is causing the new swelling. Bandaging vs velcro with MLD  with transition into day and night sleeves.  Discussed elevation and distal to proximal MLD like sweeping of the UE to start working on fluid movement  PATIENT EDUCATION:  Education details: lymphedema and treatment options, POC Person educated: Patient Education method: Explanation Education comprehension: verbalized understanding, returned demonstration, verbal cues required, tactile cues required, and needs further education  HOME EXERCISE PROGRAM: elevation  ASSESSMENT:  CLINICAL IMPRESSION: Patient is a 66 y.o. female who was seen today for physical therapy evaluation and treatment for her new onset of left UE lymphedema and new onset of Lt space occupying lesion which is either new breast cancer or metastatic lung cancer so treatment options are still pending. Discussed CDT options and decided to attempting compression bandaging with MLD to see if any decreases are achieved with focus on teaching pt or spouse how to bandage to manage edema until POC for cancer treatment is set.  Will consider velcro if bandaging does not go well but due to no insurance coverage we will start with bandages.   Pt also has edema in the left breast and most likely also chest and whole upper quadrant which will be addressed with MLD and bra if needed.  Pt also demonstrates  decreased cervical AROM and shoulder AROM due to hx of radiation which we can add to treatment plan.    OBJECTIVE IMPAIRMENTS decreased knowledge of condition, decreased knowledge of use of DME, decreased ROM, decreased strength, and increased edema.   ACTIVITY LIMITATIONS carrying and lifting  PARTICIPATION LIMITATIONS: cleaning, laundry, community activity, and yard work  PERSONAL FACTORS Past/current experiences and 1-2 comorbidities: radiation hx, metastatic status with lymphadenopathy  are also affecting patient's functional outcome.   REHAB POTENTIAL: Good  CLINICAL DECISION MAKING: Evolving/moderate complexity  EVALUATION  COMPLEXITY: Moderate  GOALS: Goals reviewed with patient? Yes   LONG TERM GOALS: Target date: 03/30/22  Pt will obtain appropriate compression garments for management of UE and breast lymphedema Baseline:  Goal status: INITIAL  2.  Pt will decrease Lt UE volume to 238ml or less to demonstrate decreased lymphatic load Baseline:  Goal status: INITIAL  3.  Pt will be ind with HEP to continue cervical and UE mobility Baseline:  Goal status: INITIAL  4.  Pt will be ind with self MLD or use of compression pump Baseline:  Goal status: INITIAL  PLAN: PT FREQUENCY: 3x/week  PT DURATION: 8 weeks  PLANNED INTERVENTIONS: Therapeutic exercises, Patient/Family education, Self Care, DME instructions, Manual lymph drainage, Compression bandaging, Taping, Manual therapy, and Re-evaluation  PLAN FOR NEXT SESSION: perform MLD and bandaging to the Lt UE - teach bandaging to pt and husband as able - teach MLD - send demo for garments - consider pump once treatment plan is established   Stark Bray, PT 02/02/2022, 9:52 PM

## 2022-02-03 LAB — CYTOLOGY - NON PAP

## 2022-02-04 ENCOUNTER — Telehealth: Payer: Self-pay | Admitting: Medical Oncology

## 2022-02-04 NOTE — Telephone Encounter (Signed)
Lab coordination-Requests cardiology labs   Ordered by Sharrell Ku, PA-C  be drawn at cancer center on the 19th. Done.   Pt understands to be NPO after midnight for the cardiology labs.

## 2022-02-07 ENCOUNTER — Other Ambulatory Visit: Payer: Self-pay | Admitting: Medical Oncology

## 2022-02-07 DIAGNOSIS — C3411 Malignant neoplasm of upper lobe, right bronchus or lung: Secondary | ICD-10-CM

## 2022-02-08 ENCOUNTER — Encounter: Payer: Self-pay | Admitting: *Deleted

## 2022-02-09 ENCOUNTER — Telehealth: Payer: Self-pay | Admitting: Pharmacy Technician

## 2022-02-09 ENCOUNTER — Other Ambulatory Visit: Payer: Self-pay

## 2022-02-09 ENCOUNTER — Other Ambulatory Visit (HOSPITAL_COMMUNITY): Payer: Self-pay

## 2022-02-09 ENCOUNTER — Inpatient Hospital Stay: Payer: Medicare HMO

## 2022-02-09 ENCOUNTER — Telehealth: Payer: Self-pay | Admitting: Pharmacist

## 2022-02-09 ENCOUNTER — Inpatient Hospital Stay (HOSPITAL_BASED_OUTPATIENT_CLINIC_OR_DEPARTMENT_OTHER): Payer: Medicare HMO | Admitting: Internal Medicine

## 2022-02-09 ENCOUNTER — Other Ambulatory Visit: Payer: Self-pay | Admitting: Physician Assistant

## 2022-02-09 VITALS — BP 138/96 | HR 98 | Temp 97.0°F | Resp 18 | Wt 151.6 lb

## 2022-02-09 DIAGNOSIS — I071 Rheumatic tricuspid insufficiency: Secondary | ICD-10-CM | POA: Diagnosis not present

## 2022-02-09 DIAGNOSIS — Z5111 Encounter for antineoplastic chemotherapy: Secondary | ICD-10-CM

## 2022-02-09 DIAGNOSIS — E785 Hyperlipidemia, unspecified: Secondary | ICD-10-CM | POA: Diagnosis not present

## 2022-02-09 DIAGNOSIS — Z87891 Personal history of nicotine dependence: Secondary | ICD-10-CM | POA: Diagnosis not present

## 2022-02-09 DIAGNOSIS — C50912 Malignant neoplasm of unspecified site of left female breast: Secondary | ICD-10-CM | POA: Diagnosis not present

## 2022-02-09 DIAGNOSIS — C3411 Malignant neoplasm of upper lobe, right bronchus or lung: Secondary | ICD-10-CM | POA: Diagnosis not present

## 2022-02-09 DIAGNOSIS — Z79899 Other long term (current) drug therapy: Secondary | ICD-10-CM | POA: Diagnosis not present

## 2022-02-09 DIAGNOSIS — C773 Secondary and unspecified malignant neoplasm of axilla and upper limb lymph nodes: Secondary | ICD-10-CM | POA: Diagnosis not present

## 2022-02-09 DIAGNOSIS — Z809 Family history of malignant neoplasm, unspecified: Secondary | ICD-10-CM | POA: Diagnosis not present

## 2022-02-09 DIAGNOSIS — I2510 Atherosclerotic heart disease of native coronary artery without angina pectoris: Secondary | ICD-10-CM | POA: Diagnosis not present

## 2022-02-09 DIAGNOSIS — R0602 Shortness of breath: Secondary | ICD-10-CM | POA: Diagnosis not present

## 2022-02-09 DIAGNOSIS — C770 Secondary and unspecified malignant neoplasm of lymph nodes of head, face and neck: Secondary | ICD-10-CM | POA: Diagnosis not present

## 2022-02-09 DIAGNOSIS — I2584 Coronary atherosclerosis due to calcified coronary lesion: Secondary | ICD-10-CM | POA: Diagnosis not present

## 2022-02-09 DIAGNOSIS — R000 Tachycardia, unspecified: Secondary | ICD-10-CM | POA: Diagnosis not present

## 2022-02-09 LAB — CMP (CANCER CENTER ONLY)
ALT: 16 U/L (ref 0–44)
AST: 19 U/L (ref 15–41)
Albumin: 4 g/dL (ref 3.5–5.0)
Alkaline Phosphatase: 48 U/L (ref 38–126)
Anion gap: 5 (ref 5–15)
BUN: 12 mg/dL (ref 8–23)
CO2: 29 mmol/L (ref 22–32)
Calcium: 9.3 mg/dL (ref 8.9–10.3)
Chloride: 105 mmol/L (ref 98–111)
Creatinine: 0.65 mg/dL (ref 0.44–1.00)
GFR, Estimated: >60 mL/min (ref >60)
Glucose, Bld: 92 mg/dL (ref 70–99)
Potassium: 4 mmol/L (ref 3.5–5.1)
Sodium: 139 mmol/L (ref 135–145)
Total Bilirubin: 0.4 mg/dL (ref 0.3–1.2)
Total Protein: 6.4 g/dL — ABNORMAL LOW (ref 6.5–8.1)

## 2022-02-09 LAB — CBC WITH DIFFERENTIAL (CANCER CENTER ONLY)
Abs Immature Granulocytes: 0.01 10*3/uL (ref 0.00–0.07)
Basophils Absolute: 0 10*3/uL (ref 0.0–0.1)
Basophils Relative: 1 %
Eosinophils Absolute: 0.3 10*3/uL (ref 0.0–0.5)
Eosinophils Relative: 6 %
HCT: 39.1 % (ref 36.0–46.0)
Hemoglobin: 13.3 g/dL (ref 12.0–15.0)
Immature Granulocytes: 0 %
Lymphocytes Relative: 23 %
Lymphs Abs: 1.1 10*3/uL (ref 0.7–4.0)
MCH: 29.9 pg (ref 26.0–34.0)
MCHC: 34 g/dL (ref 30.0–36.0)
MCV: 87.9 fL (ref 80.0–100.0)
Monocytes Absolute: 0.5 10*3/uL (ref 0.1–1.0)
Monocytes Relative: 11 %
Neutro Abs: 2.8 10*3/uL (ref 1.7–7.7)
Neutrophils Relative %: 59 %
Platelet Count: 305 10*3/uL (ref 150–400)
RBC: 4.45 MIL/uL (ref 3.87–5.11)
RDW: 13.8 % (ref 11.5–15.5)
WBC Count: 4.8 10*3/uL (ref 4.0–10.5)
nRBC: 0 % (ref 0.0–0.2)

## 2022-02-09 MED ORDER — CRIZOTINIB 250 MG PO CAPS
250.0000 mg | ORAL_CAPSULE | Freq: Two times a day (BID) | ORAL | 2 refills | Status: DC
  Filled 2022-02-09: qty 60, 30d supply, fill #0

## 2022-02-09 MED ORDER — PROCHLORPERAZINE MALEATE 10 MG PO TABS: 10.0000 mg | ORAL_TABLET | Freq: Four times a day (QID) | ORAL | 0 refills | Status: DC | PRN

## 2022-02-09 NOTE — Progress Notes (Signed)
[place] Telephone:([phone]) [phone]   [phone]  OFFICE PROGRESS NOTE  Lyman Bishop, DO [address] [place] [address]  DIAGNOSIS:  1) Stage IIIB (T2a, N3, M0) non-small cell lung cancer, adenocarcinoma with negative EGFR mutation and negative ALK gene translocation diagnosed in October 2016. 2) right breast ductal carcinoma in situ diagnosed in December 2022.  Biomarker Findings Microsatellite status - MS-Stable Tumor Mutational Burden - TMB-Low (5 Muts/Mb) Genomic Findings For a complete list of the genes assayed, please refer to the Appendix. ROS1 SDC4-ROS1 fusion RBM10 N523f*14 TP53 R273C 7 Disease relevant genes with no reportable alterations: EGFR, KRAS, ALK, BRAF, MET, RET, ERBB2  PRIOR THERAPY:  1) Concurrent chemoradiation with weekly carboplatin for AUC of 2 and paclitaxel 45 MG/M2 status post 6 cycles. Last dose was given 07/13/2015 with partial response. 2) Systemic chemotherapy with carboplatin for AUC of 5 and Alimta 500 MG/M2 every 3 weeks. First dose 11/02/2015. Status post 6 cycles. 3) palliative stereotactic radiotherapy to the left supraclavicular lymph node under the care of Dr. MTammi Klippel  Loss of fraction of radiotherapy scheduled for November 01, 2017 4) palliative radiotherapy to the left supraclavicular lymph node as well as left upper lobe lung mass completed May 04, 2020 under the care of Dr. MTammi Klippel 5) Entrectinib 600 mg p.o. daily.  Started April 23, 2021.  Her dose was reduced to 400 mg p.o. daily.  Status post 7 months of treatment.  Her treatment has been on hold since October 25, 2021 secondary to intolerance  CURRENT THERAPY: Xalkori 250 mg p.o. twice daily.  Expected to start in the next few days.  INTERVAL HISTORY: Rose CINQUEMANI89y.o. female returns to the clinic today for follow-up visit accompanied by her husband.  The patient continues to complain of increasing fatigue and weakness as well as  shortness of breath at baseline increased with exertion.  Her oxygen saturation today is 100% but she is feeling short of breath.  She denied having any chest pain but has cough with no hemoptysis.  She has no nausea, vomiting, diarrhea or constipation.  She has no headache or visual changes.  She could not tolerate her treatment with Entrectinib and she discontinued this treatment completely in April 2023.  She was found to have enlarging left axillary lymphadenopathy and the biopsy was consistent with metastatic breast adenocarcinoma.  She had CT angiogram of the chest on 01/26/2022 that showed interval increase in the moderate to large right pleural effusion as well as increase in the left axillary lymphadenopathy and increase in asymmetric left breast dermal thickening compared to the right in addition to the chronic metastatic lesion at T2, T3, T6 sclerotic lesions.  The patient underwent ultrasound-guided right thoracentesis with drainage of 1.7 L of fluid and the final cytology was consistent with metastatic adenocarcinoma of lung primary.  She is here today for evaluation and discussion of her treatment options.    MEDICAL HISTORY: Past Medical History:  Diagnosis Date   Age related osteoporosis 12/18/2020   Anxiety    Atherosclerosis 12/18/2020   Back pain    right back lateral   BCC (basal cell carcinoma of skin)    Bone metastasis 10/04/2017   [place]   Chronic pain 12/18/2020   Colon polyps    Complication of anesthesia    pt. states that she is difficult to arouse   Coronary artery calcification of  native artery    Coronary atherosclerosis 04/24/2020   Diverticulosis    Dysrhythmia    "skip beat in early 20's"   Encounter for antineoplastic chemotherapy 06/08/2015   Generalized rash 06/24/2015   H/O cold sores    History of anemia    Lung cancer (HCC)    RUL mass -being tx- radiation and chemo- remains last chemo 01-04-16, 2023 left  lung Ca   Lung field abnormal 12/18/2020   Metastasis to supraclavicular lymph node ([place]) 10/04/2017   Metastatic non-small cell lung cancer ([place]) 03/24/2020   Mixed dyslipidemia 04/24/2020   Oral lesion 06/24/2015   Osteoporosis    Personal history of colonic polyps 12/18/2020   PONV (postoperative nausea and vomiting)    Port catheter in place 11/20/2015   Rectal bleeding    right upper lobe lung mass 04/15/2015   malignant tumor found - 10'16-radiation and chemotherapy and remains with chemo at present- Dr. Earlie Server follows.   Sciatica 12/18/2020   Tricuspid regurgitation    Vitamin D deficiency 12/18/2020    ALLERGIES:  is allergic to latex and sod picosulfate-mag ox-cit acd.  MEDICATIONS:  Current Outpatient Medications  Medication Sig Dispense Refill   Acetylcysteine (NAC 600) 600 MG CAPS Take 600 mg by mouth daily.     albuterol (VENTOLIN HFA) 108 (90 Base) MCG/ACT inhaler Inhale 2 puffs into the lungs every 6 (six) hours as needed for wheezing or shortness of breath.     atorvastatin (LIPITOR) 10 MG tablet TAKE 1 TABLET BY MOUTH EVERY DAY 30 tablet 1   benzonatate (TESSALON) 200 MG capsule Take 1 capsule (200 mg total) by mouth 3 (three) times daily as needed for cough. 20 capsule 0   Biotin 10000 MCG TABS Take 10,000 mcg by mouth daily.     Calcium Carb-Cholecalciferol (CALCIUM 1000 + D PO) Take 1 tablet by mouth 2 (two) times daily. 1000 mg /1200 mg     cetirizine (ZYRTEC) 10 MG tablet Take 10 mg by mouth daily.     denosumab (PROLIA) 60 MG/ML SOSY injection Inject 60 mg into the skin every 6 (six) months.     L-Lysine 1000 MG TABS Take 1,000 mg by mouth daily.      lidocaine (LIDODERM) 5 % Place 1 patch onto the skin daily. Remove & Discard patch within 12 hours or as directed by MD 15 patch 0   Melatonin 3 MG CAPS Take 3 mg by mouth at bedtime.     Multiple Vitamins-Minerals (PRESERVISION AREDS) CAPS Take 1 capsule by mouth daily.     nitroGLYCERIN (NITROSTAT) 0.4 MG SL  tablet Place 0.4 mg under the tongue every 5 (five) minutes as needed for chest pain.     Omega-3 Fatty Acids (RA FISH OIL PO) Take 1,200 mg by mouth daily.     PREBIOTIC PRODUCT PO Take 1 tablet by mouth daily. culterelle     Psyllium (REGULOID PO) Take 750 mg by mouth daily as needed (Constipation).     Turmeric Curcumin 500 MG CAPS Take 500 mg by mouth daily.     vitamin B-12 (CYANOCOBALAMIN) 1000 MCG tablet Take 1,000 mcg by mouth daily.     No current facility-administered medications for this visit.    SURGICAL HISTORY:  Past Surgical History:  Procedure Laterality Date   BREAST EXCISIONAL BIOPSY Right    BREAST EXCISIONAL BIOPSY Right    BREAST EXCISIONAL BIOPSY Right    BREAST LUMPECTOMY WITH RADIOACTIVE SEED LOCALIZATION Right 07/28/2021   Procedure: RIGHT BREAST  LUMPECTOMY WITH RADIOACTIVE SEED LOCALIZATION X2;  Surgeon: Stark Klein, MD;  Location: [facility];  Service: General;  Laterality: Right;  60 MINUTES ROOM 2   COLONOSCOPY     COLONOSCOPY W/ POLYPECTOMY     COLONOSCOPY WITH PROPOFOL N/A 01/20/2016   Procedure: COLONOSCOPY WITH PROPOFOL;  Surgeon: Arta Silence, MD;  Location: [facility];  Service: Endoscopy;  Laterality: N/A;   CYSTECTOMY     right breast x 2   IR REMOVAL TUN ACCESS W/ PORT W/O FL MOD SED  12/12/2018   IR THORACENTESIS ASP PLEURAL SPACE W/IMG GUIDE  01/27/2022   MEDIASTINOSCOPY N/A 05/07/2015   Procedure: MEDIASTINOSCOPY;  Surgeon: Ivin Poot, MD;  Location: [facility];  Service: Thoracic;  Laterality: N/A;   TONSILLECTOMY     VIDEO BRONCHOSCOPY WITH ENDOBRONCHIAL ULTRASOUND N/A 05/07/2015   Procedure: VIDEO BRONCHOSCOPY WITH ENDOBRONCHIAL ULTRASOUND;  Surgeon: Ivin Poot, MD;  Location: [facility];  Service: Thoracic;  Laterality: N/A;    REVIEW OF SYSTEMS:  Constitutional: positive for fatigue Eyes: negative Ears, nose, mouth, throat, and face: negative Respiratory: positive for cough and dyspnea on exertion Cardiovascular:  negative Gastrointestinal: negative Genitourinary:negative Integument/breast: negative Hematologic/lymphatic: negative Musculoskeletal:positive for arthralgias and back pain Neurological: negative Behavioral/Psych: positive for anxiety, depression, and sleep disturbance Endocrine: negative Allergic/Immunologic: negative   PHYSICAL EXAMINATION: General appearance: alert, cooperative, fatigued and no distress Head: Normocephalic, without obvious abnormality, atraumatic Neck: no adenopathy, no JVD, supple, symmetrical, trachea midline and thyroid not enlarged, symmetric, no tenderness/mass/nodules Lymph nodes: Cervical, supraclavicular, and axillary nodes normal. Resp: clear to auscultation bilaterally Back: symmetric, no curvature. ROM normal. No CVA tenderness. Cardio: regular rate and rhythm, S1, S2 normal, no murmur, click, rub or gallop GI: soft, non-tender; bowel sounds normal; no masses,  no organomegaly Extremities: extremities normal, atraumatic, no cyanosis or edema Neurological exam unremarkable  ECOG PERFORMANCE STATUS: 1 - Symptomatic but completely ambulatory  Blood pressure (!) 138/96, pulse 98, temperature (!) 97 F (36.1 C), temperature source Oral, resp. rate 18, weight 151 lb 9 oz (68.7 kg), SpO2 100 %.  LABORATORY DATA: Lab Results  Component Value Date   WBC 4.8 02/09/2022   HGB 13.3 02/09/2022   HCT 39.1 02/09/2022   MCV 87.9 02/09/2022   PLT 305 02/09/2022      Chemistry      Component Value Date/Time   NA 140 01/26/2022 1153   NA 139 03/20/2017 1038   K 4.7 01/26/2022 1153   K 4.3 03/20/2017 1038   CL 103 01/26/2022 1153   CO2 23 01/26/2022 1153   CO2 27 03/20/2017 1038   BUN 14 01/26/2022 1153   BUN 15.4 03/20/2017 1038   CREATININE 0.64 01/26/2022 1153   CREATININE 0.65 01/04/2022 0923   CREATININE 0.7 03/20/2017 1038      Component Value Date/Time   CALCIUM 9.6 01/26/2022 1153   CALCIUM 9.8 03/20/2017 1038   ALKPHOS 64 01/26/2022 1153    ALKPHOS 37 (L) 03/20/2017 1038   AST 16 01/26/2022 1153   AST 21 01/04/2022 0923   AST 25 03/20/2017 1038   ALT 16 01/26/2022 1153   ALT 15 01/04/2022 0923   ALT 22 03/20/2017 1038   BILITOT 0.2 01/26/2022 1153   BILITOT 0.4 01/04/2022 0923   BILITOT 0.42 03/20/2017 1038       RADIOGRAPHIC STUDIES: MR BREAST BILATERAL W WO CONTRAST INC CAD  Result Date: 01/28/2022 CLINICAL DATA:  66 year old female with history of right breast DCIS status post lumpectomy diagnosed in December of  2022. Of note, she was also diagnosed metastatic with non-small cell lung cancer, initially in October of 2016. She had an ultrasound-guided biopsy of a left axillary lymph node on 12/17/21 demonstrating metastatic carcinoma of breast primary. She presented to the [facility] 01/21/2022 with left arm and left breast swelling that she had been experiencing since May. Diagnostic workup demonstrated a global asymmetry throughout the left breast, without any focal abnormalities, and enlarged left axillary lymph nodes were also noted. EXAM: BILATERAL BREAST MRI WITH AND WITHOUT CONTRAST TECHNIQUE: Multiplanar, multisequence MR images of both breasts were obtained prior to and following the intravenous administration of 7 ml of Gadavist Three-dimensional MR images were rendered by post-processing of the original MR data on an independent workstation. The three-dimensional MR images were interpreted, and findings are reported in the following complete MRI report for this study. Three dimensional images were evaluated at the independent interpreting workstation using the DynaCAD thin client. COMPARISON:  No prior MRI available for comparison. Correlation made with prior mammogram and ultrasound images. FINDINGS: Breast composition: b. Scattered fibroglandular tissue. Background parenchymal enhancement: Minimal. Right breast: Surgical changes noted in the superior posterior right breast consistent with history of lumpectomy. No mass  or abnormal enhancement. Left breast: No mass or abnormal enhancement. Skin thickening is noted along the lateral aspect of the left breast, though there is no enhancement within the skin. Edema is seen within the skin on the T2 images, with edema also visualized within the breast tissue, primarily in the central and inferior posterior aspect of the breast, which may account for the global asymmetry on mamography. No enhancement is seen in this area. Lymph nodes: At least 5 enlarged lymph nodes are noted in the left axilla. Ancillary findings: A partially visualized moderate right pleural effusion is noted, which was more fully evaluated on recent CT exams prior to thoracentesis. IMPRESSION: 1. Left breast edema and skin thickening, without associated abnormal enhancement. The edema in the left breast may account for the global asymmetry seen mammographically. No suspicious findings within the left breast to suggest a primary malignancy. 2. Multiple (at least 5) abnormal left axillary lymph nodes are identified. One was recently biopsy demonstrating metastatic breast cancer. 3. Surgical changes post lumpectomy in the right breast. No evidence of right breast malignancy. RECOMMENDATION: inspection of the left breast is suggested, and if there is any concern for an inflammatory breast cancer given the edema with skin thickening, than skin punch biopsy would be recommended. However, there is no enhancement of the skin or within the breast on MRI to suggest this diagnosis. Otherwise, clinical follow-up for the metastatic left axillary lymph nodes given no breast primary found by MRI. BI-RADS CATEGORY  6: Known biopsy-proven malignancy. Electronically Signed   By: Ammie Ferrier M.D.   On: 01/28/2022 12:58  IR THORACENTESIS ASP PLEURAL SPACE W/IMG GUIDE  Result Date: 01/27/2022 INDICATION: Shortness of breath. Current diagnosis of breast cancer and lung cancer. Large right pleural effusion seen on recent CT  imaging. Request for diagnostic and therapeutic thoracentesis. EXAM: ULTRASOUND GUIDED RIGHT THORACENTESIS MEDICATIONS: 1% plain lidocaine, 5 mL COMPLICATIONS: None immediate. PROCEDURE: An ultrasound guided thoracentesis was thoroughly discussed with the patient and questions answered. The benefits, risks, alternatives and complications were also discussed. The patient understands and wishes to proceed with the procedure. Written consent was obtained. Ultrasound was performed to localize and mark an adequate pocket of fluid in the right chest. The area was then prepped and draped in the normal sterile fashion. 1%  Lidocaine was used for local anesthesia. Under ultrasound guidance a 6 Fr Safe-T-Centesis catheter was introduced. Thoracentesis was performed. The catheter was removed and a dressing applied. FINDINGS: A total of approximately 1.7 L of clear yellow fluid was removed. Samples were sent to the laboratory as requested by the clinical team. IMPRESSION: Successful ultrasound guided right thoracentesis yielding 1.7 L of pleural fluid. Read by: Ascencion Dike PA-C Electronically Signed   By: Miachel Roux M.D.   On: 01/27/2022 16:39   DG Chest 1 View  Result Date: 01/27/2022 CLINICAL DATA:  Status post thoracentesis EXAM: CHEST  1 VIEW COMPARISON:  CT chest angiogram, 01/26/2022, chest radiograph, 04/07/2021 FINDINGS: The heart size and mediastinal contours are within normal limits. Significant interval decrease in a large right pleural effusion, with improved aeration of the right lung. Unchanged small left pleural effusion and or pleural thickening. No pneumothorax. Unchanged fracture deformities of the posterior right ribs. IMPRESSION: Significant interval decrease in a large right pleural effusion, with improved aeration of the right lung. No pneumothorax. Electronically Signed   By: Delanna Ahmadi M.D.   On: 01/27/2022 16:16   CT Angio Chest Pulmonary Embolism (PE) W or WO Contrast  Result Date:  01/26/2022 CLINICAL DATA:  Chest pain or SOB, pleurisy or effusion suspected Pulmonary embolism (PE) suspected, high prob. , chest pain. H/o lung cancer. EXAM: CT ANGIOGRAPHY CHEST WITH CONTRAST TECHNIQUE: Multidetector CT imaging of the chest was performed using the standard protocol during bolus administration of intravenous contrast. Multiplanar CT image reconstructions and MIPs were obtained to evaluate the vascular anatomy. RADIATION DOSE REDUCTION: This exam was performed according to the departmental dose-optimization program which includes automated exposure control, adjustment of the mA and/or kV according to patient size and/or use of iterative reconstruction technique. CONTRAST:  93m OMNIPAQUE IOHEXOL 350 MG/ML SOLN COMPARISON:  CT chest 12/07/2021 FINDINGS: Cardiovascular: Satisfactory opacification of the pulmonary arteries to the segmental level. No evidence of pulmonary embolism. The main pulmonary artery is normal in caliber. Normal heart size. Trace to small volume pericardial effusion. The thoracic aorta is normal in caliber. Mild atherosclerotic plaque of the thoracic aorta. No coronary artery calcifications. Mediastinum/Nodes: Left lung radiation fibrosis. Interval increase in left axilla lymphadenopathy: As an example a 1.3 cm (from 1.1 cm close) lymph node (5:93). No enlarged mediastinal or hilar lymph nodes with limited evaluation of the right hilar region due to adjacent lung volume loss. Thyroid gland, trachea, and esophagus demonstrate no significant findings. Lungs/Pleura: Passive atelectasis of the right lung. No query right lobectomy. No focal consolidation. No pulmonary nodule. No pulmonary mass. Interval increase in moderate to large volume right pleural effusion. No pneumothorax. Upper Abdomen: No acute abnormality. Musculoskeletal: Interval increase in asymmetric left breast dermal thickening compared to the right. Indeterminate partially visualized sclerotic appearance of the left  acromion (5:1). No acute displaced fracture. Old healed left rib fractures. Old healed right rib fractures. Chronic T6 compression fracture with associated underlying sclerotic lesion. Chronic T2-T3 sclerotic lesions again noted. Multilevel degenerative changes of the spine. Review of the MIP images confirms the above findings. IMPRESSION: 1. No pulmonary embolus. 2. Interval increase in moderate to large volume right pleural effusion. 3. Interval increase in left axillary lymphadenopathy. 4. Interval increase in asymmetric left breast dermal thickening compared to the right. 5. Trace to small volume pericardial effusion. 6. Chronic metastatic lesions with chronic T2, T3, T6 sclerotic lesions. Associated chronic T6 pathologic compression fracture. Partially visualized sclerotic appearance of the left acromion. Old healed bilateral rib  fractures, some of which may be pathologic. Electronically Signed   By: Iven Finn M.D.   On: 01/26/2022 18:40   MM DIAG BREAST TOMO BILATERAL  Result Date: 01/21/2022 CLINICAL DATA:  66 year old female presenting for diagnostic bilateral valuation. Patient has a history of right breast DCIS in 2023 status post lumpectomy. She recently had a left axillary node biopsy demonstrating metastatic carcinoma. EXAM: DIGITAL DIAGNOSTIC BILATERAL MAMMOGRAM WITH TOMOSYNTHESIS AND CAD TECHNIQUE: Bilateral digital diagnostic mammography and breast tomosynthesis was performed. The images were evaluated with computer-aided detection. COMPARISON:  Previous exam(s). ACR Breast Density Category b: There are scattered areas of fibroglandular density. FINDINGS: Right breast: There is asymmetry with distortion at the surgical site in the upper central right breast consistent with postsurgical scarring. No suspicious mass or microcalcification. No new suspicious findings elsewhere in the right breast. Left breast: There is global asymmetry throughout the left breast without a discrete mass,  predominantly in the central aspect of the breast. There is an enlarged axillary lymph node visualized in the left axilla. IMPRESSION: 1.  Postsurgical changes in the right breast from recent lumpectomy. 2. Global asymmetry in the left breast which is suspicious particularly given recently biopsy left axillary metastatic node. RECOMMENDATION: Bilateral breast MRI for evaluation of global asymmetry in the left breast. I have discussed the findings and recommendations with the patient. If applicable, a reminder letter will be sent to the patient regarding the next appointment. BI-RADS CATEGORY  4: Suspicious. Electronically Signed   By: Audie Pinto M.D.   On: 01/21/2022 15:12   ASSESSMENT AND PLAN:  This is a very pleasant 66 years old white female with metastatic non-small cell lung cancer initially diagnosed as stage IIIB non-small cell lung cancer, adenocarcinoma with negative EGFR, ALK mutations diagnosed in October 2016.  Molecular studies showed positive ROS 1 She is status post course of concurrent chemoradiation followed by consolidation chemotherapy with carboplatin and Alimta. The patient has been in observation for close to 3 years. She underwent palliative radiotherapy to the left supraclavicular lymph nodes with improvement of her disease. She also underwent SBRT to left upper lobe lung nodule as well as left supraclavicular lymphadenopathy under the care of Dr. Tammi Klippel completed in October 2021. She was found on recent CT scan of the chest to have evidence for disease recurrence in addition to malignant right pleural effusion. The patient started treatment with Entrectinib 600 mg p.o. daily and she has a lot of trouble with the treatment including fatigue, paresthesia, feeling over the most of the time and imbalance of her gait.  Her dose of Entrectinib was reduced to 400 mg p.o. daily several weeks ago and she has been tolerating the lower dose much better.  Her dose was even reduced to  Entrectinib 200 mg p.o. daily but she continues to have issues with this medication with increasing fatigue and weakness as well as rash. She discontinued her treatment completely on October 25, 2021 because of the poor quality of life. The patient has been on observation since that time but recent imaging studies with CT angiogram of the chest showed progressive disease with increase in right pleural effusion and the cytology was consistent with progressive adenocarcinoma of the lung.  She also continues to have increase in the size of the left axillary lymphadenopathy as well as the increase in the asymmetric left breast dermal thickening compared to the right and this may be also consistent with progressive breast cancer. I had a lengthy discussion with the patient  and her husband about her current condition and treatment options. I discussed with the patient the option of treatment with other oral agent like Xalkori for treatment of her ROS1 lung adenocarcinoma since she was unable to tolerate the treatment with Entrectinib.  Other option will be treatment with Lorlatinib but this will be a second line option if Xalkori does not control her disease or if she has significant adverse effect with it. She will see the pharmacist for oral oncolytic later today for education and help with the medications. I will see her back for follow-up visit in 2-3 weeks for evaluation and repeat blood work and management of any adverse effect of her treatment. She will need to continue close follow-up and monitoring by Dr. Barry Dienes and Dr. Lindi Adie for her breast cancer. For the recurrent light pleural effusion, we will continue to monitor closely and consider The patient for drainage if needed. The patient was advised to call immediately if she has any other concerning symptoms in the interval.  The patient voices understanding of current disease status and treatment options and is in agreement with the current care  plan. All questions were answered. The patient knows to call the clinic with any problems, questions or concerns. We can certainly see the patient much sooner if necessary. The total time spent in the appointment was 40 minutes.  Disclaimer: This note was dictated with voice recognition software. Similar sounding words can inadvertently be transcribed and may not be corrected upon review.

## 2022-02-09 NOTE — Telephone Encounter (Signed)
Oral Oncology Patient Advocate Encounter   Began application for assistance for Xalkori through [employer].   Application will be submitted upon completion of necessary supporting documentation.   [employer] [facility] phone number [phone].   I will continue to check the status until final determination.   Lady Deutscher, CPhT-Adv Pharmacy Patient Advocate Specialist [place] Patient Advocate Team Direct Number: [phone]  Fax: [phone]

## 2022-02-09 NOTE — Telephone Encounter (Addendum)
Oral Chemotherapy Pharmacist Encounter  I met with patient and patient's husband in clinic for overview of: Xalkori (crizotinib) for the treatment of metastatic non-small cell lung cancer, ROS1-positive, planned duration until disease progression or unacceptable toxicity.   Counseled patient on administration, dosing, side effects, monitoring, drug-food interactions, safe handling, storage, and disposal.  CBC w/ Diff and CMP from 02/09/22 assessed, no relevant lab abnormalities noted. EKG on 01/26/22 reviewed - Qtc WNL (443 ms).  Patient will take Xalkori 25m capsules, 1 capsule by mouth 2 times daily, with or with out food.  Patient knows to avoid grapefruit and grapefruit juice while on treatment with Xalkori.  Xalkori start date: 02/14/22 PM dose  Adverse effects include but are not limited to: diarrhea, nausea, constipation, fatigue, dizziness, decreased blood counts, abnormal electrolytes, hepatotoxicity, pulmonary toxicity, cardiac conduction changes, and visual disturbances.   Patient instructed that visual disturbances can occur within the first week of treatment with Xalkori. Compazine [facility]'s [facility] for patient to have on hand as needed for nausea/vomiting. OK per Dr. MJulien Nordmann  Patient will obtain anti diarrheal and alert the office of 4 or more loose stools above baseline.  Reviewed with patient importance of keeping a medication schedule and plan for any missed doses. No barriers to medication adherence identified.  Current medication list in Epic reviewed, DDIs with Xalkori identified: Category C drug-drug interaction between Xalkori and Atorvastatin - Xalkori a moderate CYP3A4 inhibitor may increase serum concentrations of atorvastatin leading to increased risk of ADEs from atorvastatin including but not limited to increase LFT, myalgias, dark colored urine. Patient made aware to monitor and alert office if these ADEs are noted - no change in therapy warranted at this  time.  Medication reconciliation performed and medication/allergy list updated.  All questions answered.  Patient agreement for treatment documented in MD note on 02/09/22.  Ms. KGerkenand her husband voiced understanding and appreciation.   Medication education handout given to patient. Patient knows to call the office with questions or concerns. Oral Chemotherapy Clinic phone number provided to patient.   RLeron Croak PharmD, BCPS, BCOP Hematology/Oncology Clinical Pharmacist [place] [phone]/19/2023 10:10 AM

## 2022-02-09 NOTE — Progress Notes (Signed)
DISCONTINUE ON PATHWAY REGIMEN - Non-Small Cell Lung     A cycle is every 28 days:     Entrectinib   **Always confirm dose/schedule in your pharmacy ordering system**  REASON: Toxicities / Adverse Event PRIOR TREATMENT: [id number]: Entrectinib 600 mg Daily Until Progression or Unacceptable Toxicity TREATMENT RESPONSE: Partial Response (PR)  START OFF PATHWAY REGIMEN - Non-Small Cell Lung   [id number]:Crizotinib 250 mg PO BID D1-28 q28 Days:   A cycle is every 28 days:     Crizotinib   **Always confirm dose/schedule in your pharmacy ordering system**  Patient Characteristics: Stage IV Metastatic, Nonsquamous, Molecular Analysis Completed, Molecular Alteration Present and Eligible for Molecular Targeted Therapy, Second Line - Molecular Targeted Therapy, ROS1 Fusion/Rearrangement Positive Therapeutic Status: Stage IV Metastatic Histology: Nonsquamous Cell Broad Molecular Profiling Status: Engineer, manufacturing Analysis Results: Alteration Present and Eligible for Molecular Targeted Therapy Molecular Alteration Present: ROS1 Fusion/Rearrangement Positive Molecular Targeted Line of Therapy: Second Paediatric nurse Therapy Intent of Therapy: Non-Curative / Palliative Intent, Discussed with Patient

## 2022-02-09 NOTE — Telephone Encounter (Signed)
Oral Oncology Patient Advocate Encounter   Received notification that prior authorization for Rose Figueroa  is required.   PA submitted on 02/09/2022 Key [id number] Status is pending     Lady Deutscher, CPhT-Adv Pharmacy Patient Advocate Specialist [place] Patient Advocate Team Direct Number: [phone]  Fax: [phone]

## 2022-02-09 NOTE — Telephone Encounter (Signed)
Oral Oncology Patient Advocate Encounter  Prior Authorization for Hulda Humphrey has been approved.    PA# [id number] Effective dates: 02/09/2022 through 07/24/2022  Patients co-pay is $3,710.62.    Lady Deutscher, CPhT-Adv Pharmacy Patient Advocate Specialist [place] Patient Advocate Team Direct Number: [phone]  Fax: [phone]

## 2022-02-10 ENCOUNTER — Other Ambulatory Visit: Payer: Medicare HMO

## 2022-02-10 ENCOUNTER — Ambulatory Visit (HOSPITAL_COMMUNITY): Payer: Medicare HMO | Attending: Internal Medicine

## 2022-02-10 ENCOUNTER — Ambulatory Visit: Payer: Medicare HMO | Admitting: Internal Medicine

## 2022-02-10 DIAGNOSIS — I071 Rheumatic tricuspid insufficiency: Secondary | ICD-10-CM | POA: Diagnosis not present

## 2022-02-10 DIAGNOSIS — I2510 Atherosclerotic heart disease of native coronary artery without angina pectoris: Secondary | ICD-10-CM | POA: Insufficient documentation

## 2022-02-10 DIAGNOSIS — E785 Hyperlipidemia, unspecified: Secondary | ICD-10-CM | POA: Insufficient documentation

## 2022-02-10 DIAGNOSIS — R000 Tachycardia, unspecified: Secondary | ICD-10-CM | POA: Insufficient documentation

## 2022-02-10 DIAGNOSIS — R0602 Shortness of breath: Secondary | ICD-10-CM | POA: Diagnosis not present

## 2022-02-10 DIAGNOSIS — I2584 Coronary atherosclerosis due to calcified coronary lesion: Secondary | ICD-10-CM | POA: Insufficient documentation

## 2022-02-10 LAB — LIPID PANEL
Chol/HDL Ratio: 2.6 ratio (ref 0.0–4.4)
Cholesterol, Total: 157 mg/dL (ref 100–199)
HDL: 60 mg/dL (ref >39)
LDL Chol Calc (NIH): 84 mg/dL (ref 0–99)
Triglycerides: 67 mg/dL (ref 0–149)
VLDL Cholesterol Cal: 13 mg/dL (ref 5–40)

## 2022-02-10 LAB — ECHOCARDIOGRAM COMPLETE
Area-P 1/2: 5.46 cm2
S' Lateral: 2.1 cm

## 2022-02-10 LAB — TSH: TSH: 6.68 u[IU]/mL — ABNORMAL HIGH (ref 0.450–4.500)

## 2022-02-11 NOTE — Telephone Encounter (Signed)
Oral Oncology Patient Advocate Encounter   Received notification that the application for assistance for Xalkori through [place] has been approved.   [employer] [facility] phone number [phone].   Effective dates: 02/10/2022 through 07/24/2022  I have spoken to the patient.   Lady Deutscher, CPhT-Adv Pharmacy Patient Advocate Specialist [place] Patient Advocate Team Direct Number: [phone]  Fax: [phone]

## 2022-02-13 NOTE — Progress Notes (Addendum)
Cardiology Office Note    Date:  02/14/2022   ID:  Rose Figueroa, DOB 29-Feb-1956, MRN [id number]  PCP:  Lyman Bishop, DO  Cardiologist:  Jenean Lindau, MD  Electrophysiologist:  None   Chief Complaint: f/u SOB -> confirmed to have malignant pleural effusion last visit  History of Present Illness:   Rose Figueroa is a 66 y.o. female with history of coronary calcification, non-small cell lung CA, breast cancer, malignant right pleural effusion s/p thoracentesis, small pericardial effusion by echo, Charcot joint, mild-moderate TR in 2021 (not seen on echo 01/2022), dyslipidemia, anxiety, atherosclerosis, lymphedema of LUE, anemia, sciatica who is seen for evaluation of SOB.    Dr. Geraldo Pitter previously pursued workup for coronary calcification with stress test in 04/2020 that was normal. Also had echo at that time with EF 60-65%, normal RV, mild-moderate TR, normal PASP. She has history of lung CA metastasized to left supraclavicular node with prior chemo/radiation. She was also recently diagnosed with breast cancer. It appears this was diagnosed 05/2021 on the right, DCIS, treated with lumpectomy 07/2021. CT 11/2021 showed right suprahilar consolidation without interval growth, slight increase in right pleural effusion, stable sclerotic metastasis in the upper thoracic spine, stable healed rib fractures, new enlarged prominent left axillary lymph nodes. Oncology note referenced that the CT was felt to reflect disease recurrence and malignant pleural effusion. Final pathology was reported to show metastatic carcinoma of breast primary. The general surgery team has also been helping to manage her LUE lymphedema which was felt related to her breast cancer. I met her in clinic earlier this month for increasing dyspnea/sensation of tightness with exam concerning for enlarging pleural effusion. She had had recent travel to [facility]. CT angio was performed, which excluded PE, but showed enlarging right  pleural effusion along with other ancillary findings related to her malignancy. Oncology assisted [place] with setting up a diagnostic/therapeutic thoracentesis with pathology returning positive for metastatic adenocarcinoma of lung primary per onc notes. She underwent echo 02/10/22 showing EF 50-55%, mild LVH, G1DD, normal RV, normal PASP, small pericardial effusion, moderate right pleural effusion, normal RA pressure.  She returns for follow-up today. She reports that the although her symptoms of dyspnea and orthopnea improved with thoracentesis, they did not fully resolve and have since returned to a similar level. She also had some discomfort up until her neck after the procedure. She is due to start targeted oral therapy for her cancer this week. She has not had any lower extremity edema, overt chest pain (moreso sensation of tightness particularly upon lying back), or syncope. She did have one episode of dizziness upon standing recently. She notes generalized fatigue.   Labwork independently reviewed: 01/2022 CBC wnl, K 4.0, Cr 0.65, Albumin 4.0, TSH 6.680, LDL 84, trig 67 12/2021 CBC wnl, K 4.2, Cr 0.65, LFTs wnl 12/2020 LDL 84, trig 55   Cardiology Studies:   Studies reviewed are outlined and summarized above. Reports included below if pertinent.   2d Echo 02/10/22    1. Left ventricular ejection fraction, by estimation, is 50 to 55%. Left  ventricular ejection fraction by PLAX is 51 %. The left ventricle has low  normal function. The left ventricle has no regional wall motion  abnormalities. There is mild left  ventricular hypertrophy. Left ventricular diastolic parameters are  consistent with Grade I diastolic dysfunction (impaired relaxation).   2. Right ventricular systolic function is normal. The right ventricular  size is normal. There is normal pulmonary artery systolic  pressure. The  estimated right ventricular systolic pressure is 80.3 mmHg.   3. A small pericardial effusion is  present. The pericardial effusion is  circumferential. There is no evidence of cardiac tamponade. Moderate  pleural effusion in the right lateral region.   4. The mitral valve is abnormal. Trivial mitral valve regurgitation.   5. The aortic valve was not well visualized. Aortic valve regurgitation  is not visualized.   6. The inferior vena cava is normal in size with greater than 50%  respiratory variability, suggesting right atrial pressure of 3 mmHg.   Comparison(s): Changes from prior study are noted. 05/19/2020: LVEF  60-65%, mild to moderate TR, no pericardial effusion.   NST 04/2020 The left ventricular ejection fraction is hyperdynamic (>65%). Nuclear stress EF: 77%. There was no ST segment deviation noted during stress. The study is normal. This is a low risk study.   Echo 04/2020    1. Left ventricular ejection fraction, by estimation, is 60 to 65%. The  left ventricle has normal function. The left ventricle has no regional  wall motion abnormalities. Left ventricular diastolic parameters are  indeterminate. The average left  ventricular global longitudinal strain is -12.2 %.   2. Right ventricular systolic function is normal. The right ventricular  size is normal. There is normal pulmonary artery systolic pressure.   3. The mitral valve is normal in structure. Trivial mitral valve  regurgitation. No evidence of mitral stenosis.   4. Tricuspid valve regurgitation is mild to moderate.   5. The aortic valve is normal in structure. Aortic valve regurgitation is  not visualized. No aortic stenosis is present.     Past Medical History:  Diagnosis Date   Age related osteoporosis 12/18/2020   Anxiety    Atherosclerosis 12/18/2020   Back pain    right back lateral   BCC (basal cell carcinoma of skin)    Bone metastasis 10/04/2017   Cancer of upper lobe of right lung ([place]) 05/19/2015   Chronic pain 12/18/2020   Colon polyps    Complication of anesthesia    pt. states  that she is difficult to arouse   Coronary artery calcification of native artery    Coronary atherosclerosis 04/24/2020   Diverticulosis    Dysrhythmia    "skip beat in early 20's"   Encounter for antineoplastic chemotherapy 06/08/2015   Generalized rash 06/24/2015   H/O cold sores    History of anemia    Lung cancer (HCC)    RUL mass -being tx- radiation and chemo- remains last chemo 01-04-16, 2023 left lung Ca   Lung field abnormal 12/18/2020   Metastasis to supraclavicular lymph node ([place]) 10/04/2017   Metastatic non-small cell lung cancer ([place]) 03/24/2020   Mixed dyslipidemia 04/24/2020   Oral lesion 06/24/2015   Osteoporosis    Personal history of colonic polyps 12/18/2020   PONV (postoperative nausea and vomiting)    Port catheter in place 11/20/2015   Rectal bleeding    right upper lobe lung mass 04/15/2015   malignant tumor found - 10'16-radiation and chemotherapy and remains with chemo at present- Dr. Earlie Server follows.   Sciatica 12/18/2020   Tricuspid regurgitation    Vitamin D deficiency 12/18/2020    Past Surgical History:  Procedure Laterality Date   BREAST EXCISIONAL BIOPSY Right    BREAST EXCISIONAL BIOPSY Right    BREAST EXCISIONAL BIOPSY Right    BREAST LUMPECTOMY WITH RADIOACTIVE SEED LOCALIZATION Right 07/28/2021   Procedure: RIGHT BREAST LUMPECTOMY WITH RADIOACTIVE SEED LOCALIZATION  X2;  Surgeon: Stark Klein, MD;  Location: [place];  Service: General;  Laterality: Right;  60 MINUTES ROOM 2   COLONOSCOPY     COLONOSCOPY W/ POLYPECTOMY     COLONOSCOPY WITH PROPOFOL N/A 01/20/2016   Procedure: COLONOSCOPY WITH PROPOFOL;  Surgeon: Arta Silence, MD;  Location: [facility];  Service: Endoscopy;  Laterality: N/A;   CYSTECTOMY     right breast x 2   IR REMOVAL TUN ACCESS W/ PORT W/O FL MOD SED  12/12/2018   IR THORACENTESIS ASP PLEURAL SPACE W/IMG GUIDE  01/27/2022   MEDIASTINOSCOPY N/A 05/07/2015   Procedure: MEDIASTINOSCOPY;  Surgeon: Ivin Poot, MD;   Location: [place];  Service: Thoracic;  Laterality: N/A;   TONSILLECTOMY     VIDEO BRONCHOSCOPY WITH ENDOBRONCHIAL ULTRASOUND N/A 05/07/2015   Procedure: VIDEO BRONCHOSCOPY WITH ENDOBRONCHIAL ULTRASOUND;  Surgeon: Ivin Poot, MD;  Location: [facility];  Service: Thoracic;  Laterality: N/A;    Current Medications: Current Meds  Medication Sig   Acetylcysteine (NAC 600) 600 MG CAPS Take 600 mg by mouth daily.   albuterol (VENTOLIN HFA) 108 (90 Base) MCG/ACT inhaler Inhale 2 puffs into the lungs every 6 (six) hours as needed for wheezing or shortness of breath.   atorvastatin (LIPITOR) 10 MG tablet Take 5 mg by mouth daily.   benzonatate (TESSALON) 200 MG capsule Take 1 capsule (200 mg total) by mouth 3 (three) times daily as needed for cough.   Biotin 10000 MCG TABS Take 10,000 mcg by mouth daily.   Calcium Carb-Cholecalciferol (CALCIUM 1000 + D PO) Take 1 tablet by mouth 2 (two) times daily. 1000 mg /1200 mg   cetirizine (ZYRTEC) 10 MG tablet Take 10 mg by mouth daily.   crizotinib (XALKORI) 250 MG capsule Take 1 capsule (250 mg total) by mouth 2 (two) times daily.   denosumab (PROLIA) 60 MG/ML SOSY injection Inject 60 mg into the skin every 6 (six) months.   L-Lysine 1000 MG TABS Take 1,000 mg by mouth daily.    lidocaine (LIDODERM) 5 % Place 1 patch onto the skin daily. Remove & Discard patch within 12 hours or as directed by MD   Melatonin 3 MG CAPS Take 3 mg by mouth at bedtime.   Multiple Vitamins-Minerals (PRESERVISION AREDS) CAPS Take 1 capsule by mouth daily.   nitroGLYCERIN (NITROSTAT) 0.4 MG SL tablet Place 0.4 mg under the tongue every 5 (five) minutes as needed for chest pain.   Omega-3 Fatty Acids (RA FISH OIL PO) Take 1,200 mg by mouth daily.   PREBIOTIC PRODUCT PO Take 1 tablet by mouth daily. culterelle   prochlorperazine (COMPAZINE) 10 MG tablet Take 1 tablet (10 mg total) by mouth every 6 (six) hours as needed for nausea or vomiting.   Psyllium (REGULOID PO) Take 750 mg by  mouth daily as needed (Constipation).   Turmeric Curcumin 500 MG CAPS Take 500 mg by mouth daily.   vitamin B-12 (CYANOCOBALAMIN) 1000 MCG tablet Take 1,000 mcg by mouth daily.      Allergies:   Latex and Sod picosulfate-mag ox-cit acd   Social History   Socioeconomic History   Marital status: Married    Spouse name: Not on file   Number of children: Not on file   Years of education: Not on file   Highest education level: Not on file  Occupational History   Occupation: retired  Tobacco Use   Smoking status: Former    Packs/day: 1.00    Years: 5.00  Total pack years: 5.00    Types: Cigarettes    Quit date: 07/26/1975    Years since quitting: 46.5   Smokeless tobacco: Never  Vaping Use   Vaping Use: Never used  Substance and Sexual Activity   Alcohol use: Yes    Alcohol/week: 0.0 standard drinks of alcohol    Comment: occ-. none now.   Drug use: No   Sexual activity: Not on file  Other Topics Concern   Not on file  Social History Narrative   Not on file   Social Determinants of Health   Financial Resource Strain: Not on file  Food Insecurity: Not on file  Transportation Needs: Not on file  Physical Activity: Not on file  Stress: Not on file  Social Connections: Not on file     Family History:  The patient's family history includes Cancer in her father and maternal grandmother; Hypertension in her mother. There is no history of Breast cancer.  ROS:   Please see the history of present illness.  All other systems are reviewed and otherwise negative.    EKG(s)/Additional Labs   EKG:  EKG is not ordered today  Recent Labs: 01/26/2022: NT-Pro BNP 256 02/09/2022: ALT 16; BUN 12; Creatinine 0.65; Hemoglobin 13.3; Platelet Count 305; Potassium 4.0; Sodium 139; TSH 6.680  Recent Lipid Panel    Component Value Date/Time   CHOL 157 02/09/2022 0000   TRIG 67 02/09/2022 0000   HDL 60 02/09/2022 0000   CHOLHDL 2.6 02/09/2022 0000   LDLCALC 84 02/09/2022 0000     PHYSICAL EXAM:    VS:  BP 102/78   Pulse (!) 102   Ht 5' 3"  (1.6 m)   Wt 153 lb (69.4 kg)   SpO2 95%   BMI 27.10 kg/m   BMI: Body mass index is 27.1 kg/m.  GEN: Well nourished, well developed female in no acute distress HEENT: normocephalic, atraumatic Neck: no JVD, carotid bruits, or masses Cardiac: RRR; no murmurs, rubs, or gallops, no LE edema (has known chronic R arm lymphedema) Respiratory:  diminished BS R lung base about 1/3 of the way up, no crackles or wheezing, normal work of breathing GI: soft, nontender, nondistended, + BS MS: left foot in boot Skin: warm and dry, no rash Neuro:  Alert and Oriented x 3, Strength and sensation are intact, follows commands Psych: euthymic mood, full affect  Wt Readings from Last 3 Encounters:  02/14/22 153 lb (69.4 kg)  02/09/22 151 lb 9 oz (68.7 kg)  01/28/22 154 lb 9.6 oz (70.1 kg)     ASSESSMENT & PLAN:   1. Malignant right pleural effusion - symptoms did improve somewhat with thoracentesis but have since recurred. Her echo demonstrated recurrent right moderate pleural effusion and exam is consistent with recurrence of fluid. Oncology's note did mention they felt this may return. I will get a 2V CXR today and plan to share those results with her oncology team as she may benefit from consideration of more regular thoracentesis procedures versus chest tube. Her BNP was normal and she had not had any other physical exam findings consistent with CHF, so this is felt to be related to her malignancy. Diastolic dysfunction is present but RA pressure was normal by echo. Given this is a malignant process, it seems unlikely that diuretic therapy would be sufficient to keep the fluid at [place].  2. Pericardial effusion - trace to small by CT 01/27/22, small by echocardiogram. She has not had any abrupt decline since the  echocardiogram was performed a few days ago, so we will continue to follow clinically. Discussed observation for warning symptoms.  Etiology of pericardial effusion is not clear at this time but could be malignant or reactive. Pericardiocentesis is not indicated at this time given the small volume. If this were to become an more significant issue, would need to consider pericardial window.  3. Sinus tachycardia - intermittently chronic back to 2017 per review of MAR. Seen similarly in 2021 cardiology visit as well. Suspect reactive to underlying medical stressors. Advised to f/u PCP for elevated TSH though (was elevated, not low). She periodically has to use inhaler therapy therefore would hold off on any beta blocker therapy at this time. Primary issue seems to be malignancy and pleural effusion rather than related to heart rate.  4. Coronary calcification - this was previously reported per Dr. Julien Nordmann note, though CT 01/26/22 showed no coronary calcifications. This did pickup mild atherosclerotic plaque of the aorta so would continue statin therapy. LDL 84. She was unable to tolerate 73m of atorvastatin so has been taking 577mdaily. Reasonable to continue this. We discussed potentially changing to rosuvastatin but since she is in the process of starting a new cancer regimen I am concerned about clouding the clinical picture in case there is statin intolerance with rosuvastatin as well. We can revisit in follow-up.    Disposition: F/u with Dr. ReGeraldo Pitterr myself in 3 months.   Medication Adjustments/Labs and Tests Ordered: Current medicines are reviewed at length with the patient today.  Concerns regarding medicines are outlined above. Medication changes, Labs and Tests ordered today are summarized above and listed in the Patient Instructions accessible in Encounters.   Signed, DaCharlie PitterPA-C  02/14/2022 2:09 PM    [place]: [phone]: [phone]

## 2022-02-14 ENCOUNTER — Other Ambulatory Visit: Payer: Self-pay

## 2022-02-14 ENCOUNTER — Ambulatory Visit
Admission: RE | Admit: 2022-02-14 | Discharge: 2022-02-14 | Disposition: A | Discharge status: Home / Self Care | Payer: Medicare HMO | Source: Ambulatory Visit | Attending: Physician Assistant | Admitting: Physician Assistant

## 2022-02-14 ENCOUNTER — Encounter: Payer: Self-pay | Admitting: Physician Assistant

## 2022-02-14 ENCOUNTER — Ambulatory Visit: Payer: Medicare HMO | Admitting: Physician Assistant

## 2022-02-14 VITALS — BP 102/78 | HR 102 | Ht 63.0 in | Wt 153.0 lb

## 2022-02-14 DIAGNOSIS — R000 Tachycardia, unspecified: Secondary | ICD-10-CM

## 2022-02-14 DIAGNOSIS — I3139 Other pericardial effusion (noninflammatory): Secondary | ICD-10-CM

## 2022-02-14 DIAGNOSIS — I2510 Atherosclerotic heart disease of native coronary artery without angina pectoris: Secondary | ICD-10-CM | POA: Diagnosis not present

## 2022-02-14 DIAGNOSIS — J910 Malignant pleural effusion: Secondary | ICD-10-CM

## 2022-02-14 DIAGNOSIS — I2584 Coronary atherosclerosis due to calcified coronary lesion: Secondary | ICD-10-CM | POA: Diagnosis not present

## 2022-02-14 DIAGNOSIS — J90 Pleural effusion, not elsewhere classified: Secondary | ICD-10-CM | POA: Diagnosis not present

## 2022-02-14 NOTE — Patient Instructions (Signed)
Medication Instructions:  Your physician recommends that you continue on your current medications as directed. Please refer to the Current Medication list given to you today. *If you need a refill on your cardiac medications before your next appointment, please call your pharmacy*   Lab Work: None   Testing/Procedures: A chest x-ray takes a picture of the organs and structures inside the chest, including the heart, lungs, and blood vessels. This test can show several things, including, whether the heart is enlarges; whether fluid is building up in the lungs; and whether pacemaker / defibrillator leads are still in place.   Follow-Up: At [facility], you and your health needs are our priority.  As part of our continuing mission to provide you with exceptional heart care, we have created designated Provider Care Teams.  These Care Teams include your primary Cardiologist (physician) and Advanced Practice Providers (APPs -  Physician Assistants and Nurse Practitioners) who all work together to provide you with the care you need, when you need it.  We recommend signing up for the patient portal called "MyChart".  Sign up information is provided on this After Visit Summary.  MyChart is used to connect with patients for Virtual Visits (Telemedicine).  Patients are able to view lab/test results, encounter notes, upcoming appointments, etc.  Non-urgent messages can be sent to your provider as well.   To learn more about what you can do with MyChart, go to [email or web address].    Your next appointment:   3 month(s)  The format for your next appointment:   In Person  Provider:   Jyl Heinz, MD or Melina Copa, PA   Other Instructions   Important Information About Sugar

## 2022-02-15 ENCOUNTER — Other Ambulatory Visit: Payer: Self-pay

## 2022-02-16 ENCOUNTER — Ambulatory Visit: Payer: Medicare HMO | Admitting: Rehabilitation

## 2022-02-16 ENCOUNTER — Encounter: Payer: Self-pay | Admitting: Rehabilitation

## 2022-02-16 ENCOUNTER — Telehealth: Payer: Self-pay | Admitting: Internal Medicine

## 2022-02-16 DIAGNOSIS — C3490 Malignant neoplasm of unspecified part of unspecified bronchus or lung: Secondary | ICD-10-CM | POA: Diagnosis not present

## 2022-02-16 DIAGNOSIS — M436 Torticollis: Secondary | ICD-10-CM

## 2022-02-16 DIAGNOSIS — I890 Lymphedema, not elsewhere classified: Secondary | ICD-10-CM | POA: Diagnosis not present

## 2022-02-16 DIAGNOSIS — M25612 Stiffness of left shoulder, not elsewhere classified: Secondary | ICD-10-CM

## 2022-02-16 NOTE — Patient Instructions (Addendum)
PLEASE KEEP YOUR BANDAGES ON AS LONG AS POSSIBLE TO GET THE BEST SWELLING REDUCTION. Should your bandages become uncomfortable or feel too tight, follow these steps: Elevate your extremity higher than your heart.  Try to move your arm or leg joints against the firmness of the bandage to help with moving the fluid and allow the bandages to loosen a bit.  If the bandaging is still is too tight, it is ok to carefully remove the top layer.  There will still be more layers under it that can provide compression to your extremity. Finally, if you STILL have significant pain after trying these steps, it is ok to take the bandage off.  Check your skin carefully for any signs of irritation  PLEASE bring ALL bandage materials back to your next appointment as we will reuse what we can TAKE CARE OF YOUR BANDAGES SO THEY WILL LAST LONGER AND STAY IN BETTER CONDITION Washing bandages:  Wash periodically using a mild detergent in warm water.  Do not use fabric softener or bleach.  Place bandages in a mesh lingerie bag or in a tied off pillow case and use the gentle cycle of the washing machine or hand wash. If you hand wash, you may want to put them in the spin cycle of your washer to get the extra water out, but make sure you put them in a mesh bag first. Do not wring or stretch them while they are wet.  Drying bandages: Lay the bandages out smoothly on a towel away from direct sunlight or heating sources that can damage the fabric. Rolling bandages in a towel and gently squeezing the towel to remove excess water before laying them out can speed up the process.  If you use a drying rack, place a towel on top of the rack to lay the bandages on.  If they hang down to dry, they fabric could be stretched out and the bandage will lose its compression.   Or, keep bandages in the mesh bag and dry them in the dryer on the low or no heat cycle. Rolling bandages: Please roll your bandages after drying them so they are ready for  your next treatment. If they are rolled too loose, they will be difficult to apply.  If rolled too tight, they can get stretched out.   TAKE CARE OF YOUR SKIN Apply a low pH moisturizing lotion to your skin daily Avoid scratching your skin Treat skin irritations quickly  Know the 5 warning signs of infection: redness, pain, warmth to touch, fever and increased swelling.  Call your physician immediately if you notice any of these signs of a possible infection.   Self bandaging the arm:   Follow along with this video:  [employer]  -OR-  Search in your internet browser: "self bandaging arm MD Ouida Sills"   And the video should appear in the video search results "Lymphedema Management: Self bandaging your arm" on YouTube   This video is for caregiver assistance:  [employer]com  -OR-  Enter " Lymphedema Management: Caregiver Bandaging you arm" into your search browser and your video should appear in the results

## 2022-02-16 NOTE — Telephone Encounter (Signed)
.  Called patient to schedule appointment per 7/25 inbasket, patient is aware of date and time.

## 2022-02-16 NOTE — Therapy (Signed)
OUTPATIENT PHYSICAL THERAPY ONCOLOGY TREATMENT  Patient Name: Rose Figueroa MRN: [id number] DOB:1956/01/17, 66 y.o., female Today's Date: 02/16/2022   PT End of Session - 02/16/22 1556     Visit Number 2    Number of Visits 74    PT Start Time 1500    PT Stop Time 3149    PT Time Calculation (min) 53 min    Activity Tolerance Patient tolerated treatment well    Behavior During Therapy [facility] for tasks assessed/performed              Past Medical History:  Diagnosis Date   Age related osteoporosis 12/18/2020   Anxiety    Atherosclerosis 12/18/2020   Back pain    right back lateral   BCC (basal cell carcinoma of skin)    Bone metastasis 10/04/2017   Cancer of upper lobe of right lung ([place]) 05/19/2015   Chronic pain 12/18/2020   Colon polyps    Complication of anesthesia    pt. states that she is difficult to arouse   Coronary artery calcification of native artery    Coronary atherosclerosis 04/24/2020   Diverticulosis    Dysrhythmia    "skip beat in early 20's"   Encounter for antineoplastic chemotherapy 06/08/2015   Generalized rash 06/24/2015   H/O cold sores    History of anemia    Lung cancer (HCC)    RUL mass -being tx- radiation and chemo- remains last chemo 01-04-16, 2023 left lung Ca   Lung field abnormal 12/18/2020   Metastasis to supraclavicular lymph node ([place]) 10/04/2017   Metastatic non-small cell lung cancer ([place]) 03/24/2020   Mixed dyslipidemia 04/24/2020   Oral lesion 06/24/2015   Osteoporosis    Personal history of colonic polyps 12/18/2020   PONV (postoperative nausea and vomiting)    Port catheter in place 11/20/2015   Rectal bleeding    right upper lobe lung mass 04/15/2015   malignant tumor found - 10'16-radiation and chemotherapy and remains with chemo at present- Dr. Earlie Server follows.   Sciatica 12/18/2020   Tricuspid regurgitation    Vitamin D deficiency 12/18/2020   Past Surgical History:  Procedure Laterality Date   BREAST  EXCISIONAL BIOPSY Right    BREAST EXCISIONAL BIOPSY Right    BREAST EXCISIONAL BIOPSY Right    BREAST LUMPECTOMY WITH RADIOACTIVE SEED LOCALIZATION Right 07/28/2021   Procedure: RIGHT BREAST LUMPECTOMY WITH RADIOACTIVE SEED LOCALIZATION X2;  Surgeon: Stark Klein, MD;  Location: [place];  Service: General;  Laterality: Right;  60 MINUTES ROOM 2   COLONOSCOPY     COLONOSCOPY W/ POLYPECTOMY     COLONOSCOPY WITH PROPOFOL N/A 01/20/2016   Procedure: COLONOSCOPY WITH PROPOFOL;  Surgeon: Arta Silence, MD;  Location: [facility];  Service: Endoscopy;  Laterality: N/A;   CYSTECTOMY     right breast x 2   IR REMOVAL TUN ACCESS W/ PORT W/O FL MOD SED  12/12/2018   IR THORACENTESIS ASP PLEURAL SPACE W/IMG GUIDE  01/27/2022   MEDIASTINOSCOPY N/A 05/07/2015   Procedure: MEDIASTINOSCOPY;  Surgeon: Ivin Poot, MD;  Location: [place];  Service: Thoracic;  Laterality: N/A;   TONSILLECTOMY     VIDEO BRONCHOSCOPY WITH ENDOBRONCHIAL ULTRASOUND N/A 05/07/2015   Procedure: VIDEO BRONCHOSCOPY WITH ENDOBRONCHIAL ULTRASOUND;  Surgeon: Ivin Poot, MD;  Location: [facility] OR;  Service: Thoracic;  Laterality: N/A;   Patient Active Problem List   Diagnosis Date Noted   Breast cancer metastasized to axillary lymph node, left ([place]) 01/29/2022   Atherosclerosis 12/18/2020  Chronic pain 12/18/2020   Lung field abnormal 12/18/2020   Personal history of colonic polyps 12/18/2020   Sciatica 12/18/2020   Vitamin D deficiency 12/18/2020   Age related osteoporosis 12/18/2020   Lung cancer (HCC)    Anxiety    Back pain    BCC (basal cell carcinoma of skin)    Colon polyps    Complication of anesthesia    Diverticulosis    Dysrhythmia    H/O cold sores    History of anemia    Osteoporosis    PONV (postoperative nausea and vomiting)    Rectal bleeding    Coronary atherosclerosis 04/24/2020   Mixed dyslipidemia 04/24/2020   Cardiac murmur 04/24/2020   Chest discomfort 04/24/2020   Metastatic non-small cell lung  cancer ([place]) 03/24/2020   Bone metastasis 10/04/2017   Metastasis to supraclavicular lymph node ([place]) 10/04/2017   Port catheter in place 11/20/2015   Generalized rash 06/24/2015   Oral lesion 06/24/2015   Encounter for antineoplastic chemotherapy 06/08/2015   Cancer of upper lobe of right lung ([place]) 05/19/2015   right upper lobe lung mass 04/15/2015    PCP: Dr. Crissie Sickles  REFERRING PROVIDER: Dr. Barry Dienes  REFERRING DIAG: I89.0 (ICD-10-CM) - Lymphedema, not elsewhere classified  THERAPY DIAG:    ONSET DATE: 11/2021  Rationale for Evaluation and Treatment Rehabilitation  SUBJECTIVE                                                                                                                                                                                        SUBJECTIVE STATEMENT: My cancer is lung cancer.    PERTINENT HISTORY:  Hx of Rt lumpectomy with no LN removed on 07/28/21. Pt declined radiation.  she has been treated for right sided stage IIIB (T2aN3) non small cell lung cancer by Dr. Julien Nordmann. This was dx in 2016. She hs received chemo in 2016 and 2017 and radiation to the left supraclavicular node as well as the lung mass in 2019 and 2021 respectively.   Recent imaging showing enlarging left axillary lymphadenopathy with metastatic breast carcinoma on recent biopsy with increased dermal thickening of the Lt breast and metastatic lesions T2, T3, T6. Also with hx of metastatic pneumothorax on the Rt.    PAIN:  Are you having pain? No Just tingling and cold in the hand occasionally   PRECAUTIONS: active cancer left axillary LNs of unknown origin  WEIGHT BEARING RESTRICTIONS wearing boot all the time for Charcot foot  FALLS:  Has patient fallen in last 6 months? No  LIVING ENVIRONMENT: Lives with: lives with their spouse  OCCUPATION: not working  LEISURE: nothing extra  HAND DOMINANCE : right   PRIOR LEVEL OF FUNCTION: Independent  PATIENT GOALS what do I do  with the swelling?   OBJECTIVE  COGNITION:  Overall cognitive status: Within functional limits for tasks assessed   PALPATION: Pitting not assessed today. Tightness in Left supraclavicular and cervical region from past radiation  OBSERVATIONS / OTHER ASSESSMENTS: Lt UE appears larger overall - left breast appears larger   POSTURE: stiff neck movements  UPPER EXTREMITY AROM/PROM:  A/PROM RIGHT   eval   Shoulder extension   Shoulder flexion 143  Shoulder abduction 165  Shoulder internal rotation   Shoulder external rotation     (Blank rows = not tested)  A/PROM LEFT   eval  Shoulder extension   Shoulder flexion 140  Shoulder abduction 165  Shoulder internal rotation   Shoulder external rotation     (Blank rows = not tested)   LYMPHEDEMA ASSESSMENTS:   LANDMARK RIGHT  eval   31  10 cm proximal to olecranon process 28.7  Olecranon process 25.5   23.7  10 cm proximal to ulnar styloid process 20.7  Just proximal to ulnar styloid process 15.1  Across hand at thumb web space 17.5  At base of 2nd digit 6.2  (Blank rows = not tested) 1829ml  LANDMARK LEFT  eval   33.5  10 cm proximal to olecranon process 31.5  Olecranon process 28.3   26.4  10 cm proximal to ulnar styloid process 23  Just proximal to ulnar styloid process 16.3  Across hand at thumb web space 18.3  At base of 2nd digit 6.0  (Blank rows = not tested) 9length 227ml  Difference of 389ml  TODAY'S TREATMENT  02/16/22: Manual Therapy: Applied compression bandage to the Left UE explaining each step to the patient and husband.  Then husband Elta [place]) reapplied the bandage with tcs, vcs, and demo as needed.  Then PT reapplied upon pt leaving. Pt was given instruction on bandage care, washing, when to remove the bandages, and how to access the [facility] caregiver bandaging your UE video per instruction section.   EVAL:Discussed CDT options including effectiveness with space occupying lesion if this is  what is causing the new swelling. Bandaging vs velcro with MLD with transition into day and night sleeves.  Discussed elevation and distal to proximal MLD like sweeping of the UE to start working on fluid movement  PATIENT EDUCATION:  Education details: lymphedema and treatment options, POC Person educated: Patient Education method: Explanation Education comprehension: verbalized understanding, returned demonstration, verbal cues required, tactile cues required, and needs further education  HOME EXERCISE PROGRAM: elevation  ASSESSMENT:  CLINICAL IMPRESSION: Focused on educated pt and husband on compression bandaging along with reviewing velcro vs bandaging and how we transition into day and night garments.  Pt still wants to try bandages due to cost but may feel that the velcro is easier.  Also discussed alight and how this could help with velcro or final day and night sleeves.  Pts husband did well with bandaging after cueing.  Pt noted the hand felt tighter than PTs so we worked on adjusting as needed.    OBJECTIVE IMPAIRMENTS decreased knowledge of condition, decreased knowledge of use of DME, decreased ROM, decreased strength, and increased edema.   ACTIVITY LIMITATIONS carrying and lifting  PARTICIPATION LIMITATIONS: cleaning, laundry, community activity, and yard work  PERSONAL FACTORS Past/current experiences and 1-2 comorbidities: radiation hx, metastatic status with lymphadenopathy  are also affecting patient's functional outcome.   REHAB POTENTIAL:  Good  CLINICAL DECISION MAKING: Evolving/moderate complexity  EVALUATION COMPLEXITY: Moderate  GOALS: Goals reviewed with patient? Yes   LONG TERM GOALS: Target date: 03/30/22  Pt will obtain appropriate compression garments for management of UE and breast lymphedema Baseline:  Goal status: INITIAL  2.  Pt will decrease Lt UE volume to 22ml or less to demonstrate decreased lymphatic load Baseline:  Goal status:  INITIAL  3.  Pt will be ind with HEP to continue cervical and UE mobility Baseline:  Goal status: INITIAL  4.  Pt will be ind with self MLD or use of compression pump Baseline:  Goal status: INITIAL  PLAN: PT FREQUENCY: 3x/week  PT DURATION: 8 weeks  PLANNED INTERVENTIONS: Therapeutic exercises, Patient/Family education, Self Care, DME instructions, Manual lymph drainage, Compression bandaging, Taping, Manual therapy, and Re-evaluation  PLAN FOR NEXT SESSION: How were bandages? Did they stay on or try to rebandage? perform MLD to the Lt UE/breast/upper quadrant mindful of active cancer in Lt axilla.  - send demo for garments - consider pump once treatment plan is established   Stark Bray, PT 02/16/2022, 4:51 PM

## 2022-02-17 ENCOUNTER — Telehealth: Payer: Self-pay

## 2022-02-17 NOTE — Telephone Encounter (Signed)
RECEIVED A LAB RESULT FROM [facility] STATING THE PATIENT HAS A ELEVATED TSH OF 6.680.   HOW DO WE NEED TO PROCEED.

## 2022-02-18 ENCOUNTER — Ambulatory Visit: Payer: Medicare HMO | Admitting: Physical Therapy

## 2022-02-18 DIAGNOSIS — I890 Lymphedema, not elsewhere classified: Secondary | ICD-10-CM | POA: Diagnosis not present

## 2022-02-18 DIAGNOSIS — M436 Torticollis: Secondary | ICD-10-CM

## 2022-02-18 DIAGNOSIS — C3490 Malignant neoplasm of unspecified part of unspecified bronchus or lung: Secondary | ICD-10-CM | POA: Diagnosis not present

## 2022-02-18 DIAGNOSIS — M25612 Stiffness of left shoulder, not elsewhere classified: Secondary | ICD-10-CM

## 2022-02-18 NOTE — Therapy (Signed)
OUTPATIENT PHYSICAL THERAPY ONCOLOGY TREATMENT  Patient Name: Rose Figueroa MRN: [id number] DOB:[birth year], 66 y.o., female Today's Date: 02/18/2022   PT End of Session - 02/18/22 1114     Visit Number 3    Number of Visits 19    Date for PT Re-Evaluation 03/30/22    PT Start Time 1005    PT Stop Time 1110    PT Time Calculation (min) 65 min    Activity Tolerance Patient tolerated treatment well    Behavior During Therapy [facility] for tasks assessed/performed               Past Medical History:  Diagnosis Date   Age related osteoporosis 12/18/2020   Anxiety    Atherosclerosis 12/18/2020   Back pain    right back lateral   BCC (basal cell carcinoma of skin)    Bone metastasis 10/04/2017   Cancer of upper lobe of right lung ([place]) 05/19/2015   Chronic pain 12/18/2020   Colon polyps    Complication of anesthesia    pt. states that she is difficult to arouse   Coronary artery calcification of native artery    Coronary atherosclerosis 04/24/2020   Diverticulosis    Dysrhythmia    "skip beat in early 20's"   Encounter for antineoplastic chemotherapy 06/08/2015   Generalized rash 06/24/2015   H/O cold sores    History of anemia    Lung cancer (HCC)    RUL mass -being tx- radiation and chemo- remains last chemo 01-04-16, 2023 left lung Ca   Lung field abnormal 12/18/2020   Metastasis to supraclavicular lymph node ([place]) 10/04/2017   Metastatic non-small cell lung cancer ([place]) 03/24/2020   Mixed dyslipidemia 04/24/2020   Oral lesion 06/24/2015   Osteoporosis    Personal history of colonic polyps 12/18/2020   PONV (postoperative nausea and vomiting)    Port catheter in place 11/20/2015   Rectal bleeding    right upper lobe lung mass 04/15/2015   malignant tumor found - 10'16-radiation and chemotherapy and remains with chemo at present- Dr. Earlie Server follows.   Sciatica 12/18/2020   Tricuspid regurgitation    Vitamin D deficiency 12/18/2020   Past Surgical History:   Procedure Laterality Date   BREAST EXCISIONAL BIOPSY Right    BREAST EXCISIONAL BIOPSY Right    BREAST EXCISIONAL BIOPSY Right    BREAST LUMPECTOMY WITH RADIOACTIVE SEED LOCALIZATION Right 07/28/2021   Procedure: RIGHT BREAST LUMPECTOMY WITH RADIOACTIVE SEED LOCALIZATION X2;  Surgeon: Stark Klein, MD;  Location: [place];  Service: General;  Laterality: Right;  60 MINUTES ROOM 2   COLONOSCOPY     COLONOSCOPY W/ POLYPECTOMY     COLONOSCOPY WITH PROPOFOL N/A 01/20/2016   Procedure: COLONOSCOPY WITH PROPOFOL;  Surgeon: Arta Silence, MD;  [facility];  Service: Endoscopy;  Laterality: N/A;   CYSTECTOMY     right breast x 2   IR REMOVAL TUN ACCESS W/ PORT W/O FL MOD SED  12/12/2018   IR THORACENTESIS ASP PLEURAL SPACE W/IMG GUIDE  01/27/2022   MEDIASTINOSCOPY N/A 05/07/2015   Procedure: MEDIASTINOSCOPY;  Surgeon: Ivin Poot, MD;  Location: [place];  Service: Thoracic;  Laterality: N/A;   TONSILLECTOMY     VIDEO BRONCHOSCOPY WITH ENDOBRONCHIAL ULTRASOUND N/A 05/07/2015   Procedure: VIDEO BRONCHOSCOPY WITH ENDOBRONCHIAL ULTRASOUND;  Surgeon: Ivin Poot, MD;  Location: [place];  Service: Thoracic;  Laterality: N/A;   Patient Active Problem List   Diagnosis Date Noted   Breast cancer metastasized to axillary  lymph node, left ([place]) 01/29/2022   Atherosclerosis 12/18/2020   Chronic pain 12/18/2020   Lung field abnormal 12/18/2020   Personal history of colonic polyps 12/18/2020   Sciatica 12/18/2020   Vitamin D deficiency 12/18/2020   Age related osteoporosis 12/18/2020   Lung cancer ([place])    Anxiety    Back pain    BCC (basal cell carcinoma of skin)    Colon polyps    Complication of anesthesia    Diverticulosis    Dysrhythmia    H/O cold sores    History of anemia    Osteoporosis    PONV (postoperative nausea and vomiting)    Rectal bleeding    Coronary atherosclerosis 04/24/2020   Mixed dyslipidemia 04/24/2020   Cardiac murmur 04/24/2020   Chest discomfort  04/24/2020   Metastatic non-small cell lung cancer ([place]) 03/24/2020   Bone metastasis 10/04/2017   Metastasis to supraclavicular lymph node ([place]) 10/04/2017   Port catheter in place 11/20/2015   Generalized rash 06/24/2015   Oral lesion 06/24/2015   Encounter for antineoplastic chemotherapy 06/08/2015   Cancer of upper lobe of right lung ([place]) 05/19/2015   right upper lobe lung mass 04/15/2015    PCP: Dr. Crissie Sickles  REFERRING PROVIDER: Dr. Barry Dienes  REFERRING DIAG: I89.0 (ICD-10-CM) - Lymphedema, not elsewhere classified  THERAPY DIAG:    ONSET DATE: 11/2021  Rationale for Evaluation and Treatment Rehabilitation  SUBJECTIVE                                                                                                                                                                                        SUBJECTIVE STATEMENT: Pt states she wearing the tg soft at night and putting the compression bandages on during the day. She has seen a difference in her swelling already  She started back on targeted therapy this week which is 2 pills a day   PERTINENT HISTORY:  Hx of Rt lumpectomy with no LN removed on 07/28/21. Pt declined radiation.  she has been treated for right sided stage IIIB (T2aN3) non small cell lung cancer by Dr. Julien Nordmann. This was dx in 2016. She hs received chemo in 2016 and 2017 and radiation to the left supraclavicular node as well as the lung mass in 2019 and 2021 respectively.   Recent imaging showing enlarging left axillary lymphadenopathy with metastatic breast carcinoma on recent biopsy with increased dermal thickening of the Lt breast and metastatic lesions T2, T3, T6. Also with hx of metastatic pneumothorax on the Rt.    PAIN:  Are you having pain? No    PRECAUTIONS: active cancer left axillary LNs of unknown origin  WEIGHT BEARING RESTRICTIONS wearing boot all the time for Charcot foot  FALLS:  Has patient fallen in last 6 months? No  LIVING  ENVIRONMENT: Lives with: lives with their spouse   OCCUPATION: not working  LEISURE: nothing extra  HAND DOMINANCE : right   PRIOR LEVEL OF FUNCTION: Independent  PATIENT GOALS what do I do with the swelling?   OBJECTIVE  COGNITION:  Overall cognitive status: Within functional limits for tasks assessed   PALPATION: Pitting not assessed today. Tightness in Left supraclavicular and cervical region from past radiation  OBSERVATIONS / OTHER ASSESSMENTS: Lt UE appears larger overall - left breast appears larger   POSTURE: stiff neck movements  UPPER EXTREMITY AROM/PROM:  A/PROM RIGHT   eval   Shoulder extension   Shoulder flexion 143  Shoulder abduction 165  Shoulder internal rotation   Shoulder external rotation     (Blank rows = not tested)  A/PROM LEFT   eval  Shoulder extension   Shoulder flexion 140  Shoulder abduction 165  Shoulder internal rotation   Shoulder external rotation     (Blank rows = not tested)   LYMPHEDEMA ASSESSMENTS:   LANDMARK RIGHT  eval    31   10 cm proximal to olecranon process 28.7   Olecranon process 25.5    23.7   10 cm proximal to ulnar styloid process 20.7   Just proximal to ulnar styloid process 15.1   Across hand at thumb web space 17.5   At base of 2nd digit 6.2   (Blank rows = not tested) 1862ml  LANDMARK LEFT  eval LEFT 09/21/2021   33.5 34  10 cm proximal to olecranon process 31.5 30.5  Olecranon process 28.3 26.3   26.4 25.7  10 cm proximal to ulnar styloid process 23 23.2  Just proximal to ulnar styloid process 16.3 16  Across hand at thumb web space 18.3 18.1  At base of 2nd digit 6.0 5.7  (Blank rows = not tested) 9length 2272ml  Difference of 32ml  TODAY'S TREATMENT  02/18/2022: Pt comes in with bandages intact that she applied herself. Remeasured arm and discussed results and plan for treatments for next week and getting a sleeve when she has stabilized reduction. Instructed in basics of the  lymphatic system and purpose of MLD.  Performed 5 diaphragmatic breaths, left inguinal nodes and left lateral trunk to groin Avoided neck and left axilla. Stationary circles on left shoudler, upper arm medial elbow and forearm to hand with return along pathways.  Extra time spent on fullness in forearm with arm in elevation.  Instructed pt on importance of elevation and exercise.  Bandages left arm with TG soft, elastomull to thumb and fingers 1-3, artiflex, foam pad at anterior elbow and 2 short stretch bandages from hand to axilla    02/16/22: Manual Therapy: Applied compression bandage to the Left UE explaining each step to the patient and husband.  Then husband Elta Gu[place]) reapplied the bandage with tcs, vcs, and demo as needed.  Then PT reapplied upon pt leaving. Pt was given instruction on bandage care, washing, when to remove the bandages, and how to access the [facility] caregiver bandaging your UE video per instruction section.   EVAL:Discussed CDT options including effectiveness with space occupying lesion if this is what is causing the new swelling. Bandaging vs velcro with MLD with transition into day and night sleeves.  Discussed elevation and distal to proximal MLD like sweeping of the UE to start working on fluid movement  PATIENT EDUCATION:  Education details: lymphedema and treatment options, POC Person educated: Patient Education method: Explanation Education comprehension: verbalized understanding, returned demonstration, verbal cues required, tactile cues required, and needs further education  HOME EXERCISE PROGRAM: elevation  ASSESSMENT:  CLINICAL IMPRESSION: Pt has had some initial reduction with compression bandages and is able to bandage it herself. She will try to keep on the bandages on for > 24 hours  this time to see if she can sleep in them. Pt aware she will be asked to keep bandages on 24/7 next week as she will coming 3 times next week with goal of attaining maximal  reduction  with less refill . Expect pt may be ready for compression sleeve fitting within a few weeks.  OBJECTIVE IMPAIRMENTS decreased knowledge of condition, decreased knowledge of use of DME, decreased ROM, decreased strength, and increased edema.   ACTIVITY LIMITATIONS carrying and lifting  PARTICIPATION LIMITATIONS: cleaning, laundry, community activity, and yard work  PERSONAL FACTORS Past/current experiences and 1-2 comorbidities: radiation hx, metastatic status with lymphadenopathy  are also affecting patient's functional outcome.   REHAB POTENTIAL: Good  CLINICAL DECISION MAKING: Evolving/moderate complexity  EVALUATION COMPLEXITY: Moderate  GOALS: Goals reviewed with patient? Yes   LONG TERM GOALS: Target date: 03/30/22  Pt will obtain appropriate compression garments for management of UE and breast lymphedema Baseline:  Goal status: INITIAL  2.  Pt will decrease Lt UE volume to 280ml or less to demonstrate decreased lymphatic load Baseline:  Goal status: INITIAL  3.  Pt will be ind with HEP to continue cervical and UE mobility Baseline:  Goal status: INITIAL  4.  Pt will be ind with self MLD or use of compression pump Baseline:  Goal status: INITIAL  PLAN: PT FREQUENCY: 3x/week  PT DURATION: 8 weeks  PLANNED INTERVENTIONS: Therapeutic exercises, Patient/Family education, Self Care, DME instructions, Manual lymph drainage, Compression bandaging, Taping, Manual therapy, and Re-evaluation  PLAN FOR NEXT SESSION: How were bandages? Did they stay on or try to rebandage? perform MLD to the Lt UE/breast/upper quadrant mindful of active cancer in Lt axilla.  - send demo for garments - consider pump once treatment plan is established  Helene Kelp K. Owens Shark PT  Norwood Levo, PT 02/18/2022, 11:18 AM

## 2022-02-18 NOTE — Patient Instructions (Addendum)
When resting, keep arm elevated with hand higher than elbow higher than shoulder. "Queen wave" ( twisting forearm) when in bandages [place] and straighten elbow

## 2022-02-21 ENCOUNTER — Ambulatory Visit: Payer: Medicare HMO

## 2022-02-21 DIAGNOSIS — M436 Torticollis: Secondary | ICD-10-CM

## 2022-02-21 DIAGNOSIS — M25612 Stiffness of left shoulder, not elsewhere classified: Secondary | ICD-10-CM

## 2022-02-21 DIAGNOSIS — I890 Lymphedema, not elsewhere classified: Secondary | ICD-10-CM | POA: Diagnosis not present

## 2022-02-21 DIAGNOSIS — C3490 Malignant neoplasm of unspecified part of unspecified bronchus or lung: Secondary | ICD-10-CM

## 2022-02-21 NOTE — Therapy (Signed)
OUTPATIENT PHYSICAL THERAPY ONCOLOGY TREATMENT  Patient Name: Rose Figueroa MRN: [id number] DOB:[birth year], 66 y.o., female Today's Date: 02/21/2022   PT End of Session - 02/21/22 1013     Visit Number 4    Number of Visits 19    Date for PT Re-Evaluation 03/30/22    PT Start Time 1007    PT Stop Time 1102    PT Time Calculation (min) 55 min    Activity Tolerance Patient tolerated treatment well    Behavior During Therapy [facility] for tasks assessed/performed               Past Medical History:  Diagnosis Date   Age related osteoporosis 12/18/2020   Anxiety    Atherosclerosis 12/18/2020   Back pain    right back lateral   BCC (basal cell carcinoma of skin)    Bone metastasis 10/04/2017   Cancer of upper lobe of right lung ([place]) 05/19/2015   Chronic pain 12/18/2020   Colon polyps    Complication of anesthesia    pt. states that she is difficult to arouse   Coronary artery calcification of native artery    Coronary atherosclerosis 04/24/2020   Diverticulosis    Dysrhythmia    "skip beat in early 20's"   Encounter for antineoplastic chemotherapy 06/08/2015   Generalized rash 06/24/2015   H/O cold sores    History of anemia    Lung cancer (HCC)    RUL mass -being tx- radiation and chemo- remains last chemo 01-04-16, 2023 left lung Ca   Lung field abnormal 12/18/2020   Metastasis to supraclavicular lymph node ([place]) 10/04/2017   Metastatic non-small cell lung cancer ([place]) 03/24/2020   Mixed dyslipidemia 04/24/2020   Oral lesion 06/24/2015   Osteoporosis    Personal history of colonic polyps 12/18/2020   PONV (postoperative nausea and vomiting)    Port catheter in place 11/20/2015   Rectal bleeding    right upper lobe lung mass 04/15/2015   malignant tumor found - 10'16-radiation and chemotherapy and remains with chemo at present- Dr. Earlie Server follows.   Sciatica 12/18/2020   Tricuspid regurgitation    Vitamin D deficiency 12/18/2020   Past Surgical History:   Procedure Laterality Date   BREAST EXCISIONAL BIOPSY Right    BREAST EXCISIONAL BIOPSY Right    BREAST EXCISIONAL BIOPSY Right    BREAST LUMPECTOMY WITH RADIOACTIVE SEED LOCALIZATION Right 07/28/2021   Procedure: RIGHT BREAST LUMPECTOMY WITH RADIOACTIVE SEED LOCALIZATION X2;  Surgeon: Stark Klein, MD;  Location: [place];  Service: General;  Laterality: Right;  60 MINUTES ROOM 2   COLONOSCOPY     COLONOSCOPY W/ POLYPECTOMY     COLONOSCOPY WITH PROPOFOL N/A 01/20/2016   Procedure: COLONOSCOPY WITH PROPOFOL;  Surgeon: Arta Silence, MD;  [facility];  Service: Endoscopy;  Laterality: N/A;   CYSTECTOMY     right breast x 2   IR REMOVAL TUN ACCESS W/ PORT W/O FL MOD SED  12/12/2018   IR THORACENTESIS ASP PLEURAL SPACE W/IMG GUIDE  01/27/2022   MEDIASTINOSCOPY N/A 05/07/2015   Procedure: MEDIASTINOSCOPY;  Surgeon: Ivin Poot, MD;  Location: [place];  Service: Thoracic;  Laterality: N/A;   TONSILLECTOMY     VIDEO BRONCHOSCOPY WITH ENDOBRONCHIAL ULTRASOUND N/A 05/07/2015   Procedure: VIDEO BRONCHOSCOPY WITH ENDOBRONCHIAL ULTRASOUND;  Surgeon: Ivin Poot, MD;  Location: [place];  Service: Thoracic;  Laterality: N/A;   Patient Active Problem List   Diagnosis Date Noted   Breast cancer metastasized to axillary  lymph node, left ([place]) 01/29/2022   Atherosclerosis 12/18/2020   Chronic pain 12/18/2020   Lung field abnormal 12/18/2020   Personal history of colonic polyps 12/18/2020   Sciatica 12/18/2020   Vitamin D deficiency 12/18/2020   Age related osteoporosis 12/18/2020   Lung cancer ([place])    Anxiety    Back pain    BCC (basal cell carcinoma of skin)    Colon polyps    Complication of anesthesia    Diverticulosis    Dysrhythmia    H/O cold sores    History of anemia    Osteoporosis    PONV (postoperative nausea and vomiting)    Rectal bleeding    Coronary atherosclerosis 04/24/2020   Mixed dyslipidemia 04/24/2020   Cardiac murmur 04/24/2020   Chest discomfort  04/24/2020   Metastatic non-small cell lung cancer ([place]) 03/24/2020   Bone metastasis 10/04/2017   Metastasis to supraclavicular lymph node ([place]) 10/04/2017   Port catheter in place 11/20/2015   Generalized rash 06/24/2015   Oral lesion 06/24/2015   Encounter for antineoplastic chemotherapy 06/08/2015   Cancer of upper lobe of right lung ([place]) 05/19/2015   right upper lobe lung mass 04/15/2015    PCP: Dr. Crissie Sickles  REFERRING PROVIDER: Dr. Barry Dienes  REFERRING DIAG: I89.0 (ICD-10-CM) - Lymphedema, not elsewhere classified  THERAPY DIAG:    ONSET DATE: 11/2021  Rationale for Evaluation and Treatment Rehabilitation  SUBJECTIVE                                                                                                                                                                                        SUBJECTIVE STATEMENT: My husband has been wrapping me at home and it seems to be going well. The lymph nodes under my armpit have been feeling smaller since starting this so that's good.   PERTINENT HISTORY:  Hx of Rt lumpectomy with no LN removed on 07/28/21. Pt declined radiation.  she has been treated for right sided stage IIIB (T2aN3) non small cell lung cancer by Dr. Julien Nordmann. This was dx in 2016. She hs received chemo in 2016 and 2017 and radiation to the left supraclavicular node as well as the lung mass in 2019 and 2021 respectively.   Recent imaging showing enlarging left axillary lymphadenopathy with metastatic breast carcinoma on recent biopsy with increased dermal thickening of the Lt breast and metastatic lesions T2, T3, T6. Also with hx of metastatic pneumothorax on the Rt.    PAIN:  Are you having pain? No    PRECAUTIONS: active cancer left axillary LNs of unknown origin  WEIGHT BEARING RESTRICTIONS wearing boot all the time for Charcot foot  FALLS:  Has patient fallen in last 6 months? No  LIVING ENVIRONMENT: Lives with: lives with their  spouse   OCCUPATION: not working  LEISURE: nothing extra  HAND DOMINANCE : right   PRIOR LEVEL OF FUNCTION: Independent  PATIENT GOALS what do I do with the swelling?   OBJECTIVE  COGNITION:  Overall cognitive status: Within functional limits for tasks assessed   PALPATION: Pitting not assessed today. Tightness in Left supraclavicular and cervical region from past radiation  OBSERVATIONS / OTHER ASSESSMENTS: Lt UE appears larger overall - left breast appears larger   POSTURE: stiff neck movements  UPPER EXTREMITY AROM/PROM:  A/PROM RIGHT   eval   Shoulder extension   Shoulder flexion 143  Shoulder abduction 165  Shoulder internal rotation   Shoulder external rotation     (Blank rows = not tested)  A/PROM LEFT   eval  Shoulder extension   Shoulder flexion 140  Shoulder abduction 165  Shoulder internal rotation   Shoulder external rotation     (Blank rows = not tested)   LYMPHEDEMA ASSESSMENTS:   LANDMARK RIGHT  eval    31   10 cm proximal to olecranon process 28.7   Olecranon process 25.5    23.7   10 cm proximal to ulnar styloid process 20.7   Just proximal to ulnar styloid process 15.1   Across hand at thumb web space 17.5   At base of 2nd digit 6.2   (Blank rows = not tested) 185ml  LANDMARK LEFT  eval LEFT 09/21/2021   33.5 34  10 cm proximal to olecranon process 31.5 30.5  Olecranon process 28.3 26.3   26.4 25.7  10 cm proximal to ulnar styloid process 23 23.2  Just proximal to ulnar styloid process 16.3 16  Across hand at thumb web space 18.3 18.1  At base of 2nd digit 6.0 5.7  (Blank rows = not tested) 9length 2241ml  Difference of 31ml  TODAY'S TREATMENT  02/21/22: Pt comes in with bandages intact that her husband had applied. Offered critique while removing as bandages had been wrapped up but then back down her arm (the 10 and 12 cm ) Manual Therapy Manual Lymph Drainage: In Supine (avoided short neck due to radiation  fibrosis), 5 diaphragmatic breaths, Lt inguinal and Rt axillary nodes, Lt axillo-inguinal and anterior inter-axillary anastomosis, then focused on Lt UE working from upper lateral arm, medial to latera, then lateral again, next worked on Manufacturing systems engineer and post forearm , spending time on anterior aspect where increase fluid noted, briefly on dorsum of hand then retraced all steps ending with LN's. Reviewed basics of anatomy of lymphatic system and basic principles of MLD while performing, especially importance of light pressure and skin stretch, not slide. Compression Bandaging: Cocoa butter applied to Lt UE, next TG soft, then Elastomull to fingers 1-4, Artiflex with foam at cubital fossa, then 1-6 cm to hand, and 1-10 and 12 cm short stretch bandages from wrist to axilla reviewing correct technique with pt while applying.   02/18/2022:  Pt comes in with bandages intact that she applied herself. Remeasured arm and discussed results and plan for treatments for next week and getting a sleeve when she has stabilized reduction. Instructed in basics of the lymphatic system and purpose of MLD.  Performed 5 diaphragmatic breaths, left inguinal nodes and left lateral trunk to groin Avoided neck and left axilla. Stationary circles on left shoudler, upper arm medial elbow and forearm to hand with return along pathways.  Extra time spent on fullness in forearm with arm in elevation.  Instructed pt on importance of elevation and exercise.  Bandages left arm with TG soft, elastomull to thumb and fingers 1-3, artiflex, foam pad at anterior elbow and 2 short stretch bandages from hand to axilla    02/16/22: Manual Therapy: Applied compression bandage to the Left UE explaining each step to the patient and husband.  Then husband Elta [place]) reapplied the bandage with tcs, vcs, and demo as needed.  Then PT reapplied upon pt leaving. Pt was given instruction on bandage care, washing, when to remove the bandages, and how to access the [facility] caregiver bandaging your UE video per instruction section.   EVAL:Discussed CDT options including effectiveness with space occupying lesion if this is what is causing the new swelling. Bandaging vs velcro with MLD with transition into day and night sleeves.  Discussed elevation and distal to proximal MLD like sweeping of the UE to start working on fluid movement  PATIENT EDUCATION:  Education details: lymphedema and treatment options, POC Person educated: Patient Education method: Explanation Education comprehension: verbalized understanding, returned demonstration, verbal cues required, tactile cues required, and needs further education  HOME EXERCISE PROGRAM: elevation  ASSESSMENT:  CLINICAL IMPRESSION: Pt comes in with bandages intact but were wrapped up and down arm so reviewed correct technique while removing, and then again while reapplying at end of session.  OBJECTIVE IMPAIRMENTS decreased knowledge of condition, decreased knowledge of use of DME, decreased ROM, decreased strength, and increased edema.   ACTIVITY LIMITATIONS carrying and lifting  PARTICIPATION LIMITATIONS: cleaning, laundry, community activity, and yard work  PERSONAL FACTORS Past/current experiences and 1-2 comorbidities: radiation hx, metastatic status with lymphadenopathy  are also affecting patient's functional outcome.   REHAB POTENTIAL: Good  CLINICAL DECISION MAKING: Evolving/moderate complexity  EVALUATION COMPLEXITY: Moderate  GOALS: Goals reviewed with patient? Yes   LONG TERM GOALS: Target date: 03/30/22  Pt will obtain appropriate compression garments for management of UE and breast lymphedema Baseline:  Goal status: INITIAL  2.  Pt will decrease Lt UE volume to 29ml or less to demonstrate decreased lymphatic load Baseline:  Goal status: INITIAL  3.  Pt will be ind with HEP to continue cervical and UE mobility Baseline:  Goal status: INITIAL  4.  Pt will be ind with self  MLD or use of compression pump Baseline:  Goal status: INITIAL  PLAN: PT FREQUENCY: 3x/week  PT DURATION: 8 weeks  PLANNED INTERVENTIONS: Therapeutic exercises, Patient/Family education, Self Care, DME instructions, Manual lymph drainage, Compression bandaging, Taping, Manual therapy, and Re-evaluation  PLAN FOR NEXT SESSION:  Cont compression bandaging (review this prn) and MLD to the Lt UE/breast/upper quadrant mindful of active cancer in Lt axilla and begin instructing pt having her return demo and issue handout  - send demo for garments - consider pump once treatment plan is established   Otelia Limes, PTA 02/21/2022, 11:52 AM

## 2022-02-23 ENCOUNTER — Encounter: Payer: Medicare HMO | Admitting: Rehabilitation

## 2022-02-25 ENCOUNTER — Encounter: Payer: Self-pay | Admitting: Rehabilitation

## 2022-02-25 ENCOUNTER — Ambulatory Visit: Payer: Medicare HMO | Attending: General Surgery | Admitting: Rehabilitation

## 2022-02-25 DIAGNOSIS — M25612 Stiffness of left shoulder, not elsewhere classified: Secondary | ICD-10-CM | POA: Diagnosis not present

## 2022-02-25 DIAGNOSIS — I890 Lymphedema, not elsewhere classified: Secondary | ICD-10-CM | POA: Diagnosis not present

## 2022-02-25 DIAGNOSIS — M436 Torticollis: Secondary | ICD-10-CM | POA: Insufficient documentation

## 2022-02-25 DIAGNOSIS — C3490 Malignant neoplasm of unspecified part of unspecified bronchus or lung: Secondary | ICD-10-CM | POA: Diagnosis not present

## 2022-02-25 NOTE — Therapy (Signed)
OUTPATIENT PHYSICAL THERAPY ONCOLOGY TREATMENT  Patient Name: Rose Figueroa MRN: [id number] DOB:08/10/1955, 66 y.o., female Today's Date: 02/25/2022   PT End of Session - 02/25/22 2139     Visit Number 5    Number of Visits 19    Date for PT Re-Evaluation 03/30/22    PT Start Time 1000    PT Stop Time 1059    PT Time Calculation (min) 59 min    Activity Tolerance Patient tolerated treatment well    Behavior During Therapy [facility] for tasks assessed/performed               Past Medical History:  Diagnosis Date   Age related osteoporosis 12/18/2020   Anxiety    Atherosclerosis 12/18/2020   Back pain    right back lateral   BCC (basal cell carcinoma of skin)    Bone metastasis 10/04/2017   Cancer of upper lobe of right lung ([place]) 05/19/2015   Chronic pain 12/18/2020   Colon polyps    Complication of anesthesia    pt. states that she is difficult to arouse   Coronary artery calcification of native artery    Coronary atherosclerosis 04/24/2020   Diverticulosis    Dysrhythmia    "skip beat in early 20's"   Encounter for antineoplastic chemotherapy 06/08/2015   Generalized rash 06/24/2015   H/O cold sores    History of anemia    Lung cancer (HCC)    RUL mass -being tx- radiation and chemo- remains last chemo 01-04-16, 2023 left lung Ca   Lung field abnormal 12/18/2020   Metastasis to supraclavicular lymph node ([place]) 10/04/2017   Metastatic non-small cell lung cancer ([place]) 03/24/2020   Mixed dyslipidemia 04/24/2020   Oral lesion 06/24/2015   Osteoporosis    Personal history of colonic polyps 12/18/2020   PONV (postoperative nausea and vomiting)    Port catheter in place 11/20/2015   Rectal bleeding    right upper lobe lung mass 04/15/2015   malignant tumor found - 10'16-radiation and chemotherapy and remains with chemo at present- Dr. Earlie Server follows.   Sciatica 12/18/2020   Tricuspid regurgitation    Vitamin D deficiency 12/18/2020   Past Surgical History:   Procedure Laterality Date   BREAST EXCISIONAL BIOPSY Right    BREAST EXCISIONAL BIOPSY Right    BREAST EXCISIONAL BIOPSY Right    BREAST LUMPECTOMY WITH RADIOACTIVE SEED LOCALIZATION Right 07/28/2021   Procedure: RIGHT BREAST LUMPECTOMY WITH RADIOACTIVE SEED LOCALIZATION X2;  Surgeon: Stark Klein, MD;  Location: [place];  Service: General;  Laterality: Right;  60 MINUTES ROOM 2   COLONOSCOPY     COLONOSCOPY W/ POLYPECTOMY     COLONOSCOPY WITH PROPOFOL N/A 01/20/2016   Procedure: COLONOSCOPY WITH PROPOFOL;  Surgeon: Arta Silence, MD;  Location: [facility];  Service: Endoscopy;  Laterality: N/A;   CYSTECTOMY     right breast x 2   IR REMOVAL TUN ACCESS W/ PORT W/O FL MOD SED  12/12/2018   IR THORACENTESIS ASP PLEURAL SPACE W/IMG GUIDE  01/27/2022   MEDIASTINOSCOPY N/A 05/07/2015   Procedure: MEDIASTINOSCOPY;  Surgeon: Ivin Poot, MD;  Location: [place];  Service: Thoracic;  Laterality: N/A;   TONSILLECTOMY     VIDEO BRONCHOSCOPY WITH ENDOBRONCHIAL ULTRASOUND N/A 05/07/2015   Procedure: VIDEO BRONCHOSCOPY WITH ENDOBRONCHIAL ULTRASOUND;  Surgeon: Ivin Poot, MD;  Location: [place];  Service: Thoracic;  Laterality: N/A;   Patient Active Problem List   Diagnosis Date Noted   Breast cancer metastasized to axillary  lymph node, left ([place]) 01/29/2022   Atherosclerosis 12/18/2020   Chronic pain 12/18/2020   Lung field abnormal 12/18/2020   Personal history of colonic polyps 12/18/2020   Sciatica 12/18/2020   Vitamin D deficiency 12/18/2020   Age related osteoporosis 12/18/2020   Lung cancer ([place])    Anxiety    Back pain    BCC (basal cell carcinoma of skin)    Colon polyps    Complication of anesthesia    Diverticulosis    Dysrhythmia    H/O cold sores    History of anemia    Osteoporosis    PONV (postoperative nausea and vomiting)    Rectal bleeding    Coronary atherosclerosis 04/24/2020   Mixed dyslipidemia 04/24/2020   Cardiac murmur 04/24/2020   Chest discomfort  04/24/2020   Metastatic non-small cell lung cancer ([place]) 03/24/2020   Bone metastasis 10/04/2017   Metastasis to supraclavicular lymph node ([place]) 10/04/2017   Port catheter in place 11/20/2015   Generalized rash 06/24/2015   Oral lesion 06/24/2015   Encounter for antineoplastic chemotherapy 06/08/2015   Cancer of upper lobe of right lung ([place]) 05/19/2015   right upper lobe lung mass 04/15/2015    PCP: Dr. Crissie Sickles  REFERRING PROVIDER: Dr. Barry Dienes  REFERRING DIAG: I89.0 (ICD-10-CM) - Lymphedema, not elsewhere classified  THERAPY DIAG:    ONSET DATE: 11/2021  Rationale for Evaluation and Treatment Rehabilitation  SUBJECTIVE                                                                                                                                                                                        SUBJECTIVE STATEMENT: Everything is going really well  PERTINENT HISTORY:  Hx of Rt lumpectomy with no LN removed on 07/28/21. Pt declined radiation.  she has been treated for right sided stage IIIB (T2aN3) non small cell lung cancer by Dr. Julien Nordmann. This was dx in 2016. She hs received chemo in 2016 and 2017 and radiation to the left supraclavicular node as well as the lung mass in 2019 and 2021 respectively.   Recent imaging showing enlarging left axillary lymphadenopathy with metastatic breast carcinoma on recent biopsy with increased dermal thickening of the Lt breast and metastatic lesions T2, T3, T6. Also with hx of metastatic pneumothorax on the Rt.    PAIN:  Are you having pain? No    PRECAUTIONS: active cancer left axillary LNs of unknown origin  WEIGHT BEARING RESTRICTIONS wearing boot all the time for Charcot foot  FALLS:  Has patient fallen in last 6 months? No  LIVING ENVIRONMENT: Lives with: lives with their spouse   OCCUPATION: not working  LEISURE:  nothing extra  HAND DOMINANCE : right   PRIOR LEVEL OF FUNCTION: Independent  PATIENT GOALS what do  I do with the swelling?   OBJECTIVE  COGNITION:  Overall cognitive status: Within functional limits for tasks assessed   PALPATION: Pitting not assessed today. Tightness in Left supraclavicular and cervical region from past radiation  OBSERVATIONS / OTHER ASSESSMENTS: Lt UE appears larger overall - left breast appears larger   POSTURE: stiff neck movements  UPPER EXTREMITY AROM/PROM:  A/PROM RIGHT   eval   Shoulder extension   Shoulder flexion 143  Shoulder abduction 165  Shoulder internal rotation   Shoulder external rotation     (Blank rows = not tested)  A/PROM LEFT   eval  Shoulder extension   Shoulder flexion 140  Shoulder abduction 165  Shoulder internal rotation   Shoulder external rotation     (Blank rows = not tested)   LYMPHEDEMA ASSESSMENTS:   LANDMARK RIGHT  eval    31   10 cm proximal to olecranon process 28.7   Olecranon process 25.5    23.7   10 cm proximal to ulnar styloid process 20.7   Just proximal to ulnar styloid process 15.1   Across hand at thumb web space 17.5   At base of 2nd digit 6.2   (Blank rows = not tested) 186ml  LANDMARK LEFT  eval LEFT 02/18/2022 02/25/22  Axilla   34  15cm proximal to olecranon 33.5 34 32.3  10 cm proximal to olecranon process 31.5 30.5 30.1  Olecranon process 28.3 26.3 26  15cm proximal to ulnar styloid 26.4 25.7 23.7  10 cm proximal to ulnar styloid process 23 23.2 20.8  Just proximal to ulnar styloid process 16.3 16 15.5  Across hand at thumb web space 18.3 18.1 18.2  At base of 2nd digit 6.0 5.7 6.0  (Blank rows = not tested) 9length 2270ml on eval 1943ml on 02/25/22 Difference of 364ml then 22ml on 02/25/22  TODAY'S TREATMENT  02/25/22 Manual Therapy Measured pt for sigvaris secure flat knit garment size S3 and gauntlet size Small with ordering info given to patient.   Reviewed how to order more bandaging supplies with handout Gave handout on self bandaging per pt request Brief Manual Lymph  Drainage: In Supine (avoided short neck due to radiation fibrosis), 5 diaphragmatic breaths, Lt inguinal and Rt axillary nodes, Lt axillo-inguinal and anterior inter-axillary anastomosis, then focused on Lt UE working from upper lateral arm, medial to latera, then lateral again, next worked on Manufacturing systems engineer and post forearm , spending time on anterior aspect where increase fluid noted, briefly on dorsum of hand then retraced all steps ending with LN's.  Compression Bandaging: Cocoa butter applied to Lt UE, next TG soft, then Elastomull to fingers 1-4, Artiflex with foam at cubital fossa, then 1-6 cm to hand, and 1-10 and 12 cm short stretch bandages from wrist to axilla reviewing correct technique with pt while applying.   02/21/22: Pt comes in with bandages intact that her husband had applied. Offered critique while removing as bandages had been wrapped up but then back down her arm (the 10 and 12 cm ) Manual Therapy Manual Lymph Drainage: In Supine (avoided short neck due to radiation fibrosis), 5 diaphragmatic breaths, Lt inguinal and Rt axillary nodes, Lt axillo-inguinal and anterior inter-axillary anastomosis, then focused on Lt UE working from upper lateral arm, medial to latera, then lateral again, next worked on Manufacturing systems engineer and post forearm , spending time on anterior aspect where increase  fluid noted, briefly on dorsum of hand then retraced all steps ending with LN's. Reviewed basics of anatomy of lymphatic system and basic principles of MLD while performing, especially importance of light pressure and skin stretch, not slide. Compression Bandaging: Cocoa butter applied to Lt UE, next TG soft, then Elastomull to fingers 1-4, Artiflex with foam at cubital fossa, then 1-6 cm to hand, and 1-10 and 12 cm short stretch bandages from wrist to axilla reviewing correct technique with pt while applying.   02/18/2022:  Pt comes in with bandages intact that she applied herself. Remeasured arm and discussed results and plan for  treatments for next week and getting a sleeve when she has stabilized reduction. Instructed in basics of the lymphatic system and purpose of MLD.  Performed 5 diaphragmatic breaths, left inguinal nodes and left lateral trunk to groin Avoided neck and left axilla. Stationary circles on left shoudler, upper arm medial elbow and forearm to hand with return along pathways.  Extra time spent on fullness in forearm with arm in elevation.  Instructed pt on importance of elevation and exercise.  Bandages left arm with TG soft, elastomull to thumb and fingers 1-3, artiflex, foam pad at anterior elbow and 2 short stretch bandages from hand to axilla    02/16/22: Manual Therapy: Applied compression bandage to the Left UE explaining each step to the patient and husband.  Then husband Elta [place]) reapplied the bandage with tcs, vcs, and demo as needed.  Then PT reapplied upon pt leaving. Pt was given instruction on bandage care, washing, when to remove the bandages, and how to access the [facility] caregiver bandaging your UE video per instruction section.   EVAL:Discussed CDT options including effectiveness with space occupying lesion if this is what is causing the new swelling. Bandaging vs velcro with MLD with transition into day and night sleeves.  Discussed elevation and distal to proximal MLD like sweeping of the UE to start working on fluid movement  PATIENT EDUCATION:  Education details: lymphedema and treatment options, POC Person educated: Patient Education method: Explanation Education comprehension: verbalized understanding, returned demonstration, verbal cues required, tactile cues required, and needs further education  HOME EXERCISE PROGRAM: Elevation, self bandaging, self MLD  ASSESSMENT:  CLINICAL IMPRESSION: Pt is now down to a fluid difference of 37ml between sides and feels ready to get a day garment.  Continued with MLD and bandaging.  Pt only missing self MLD information at this  point?  OBJECTIVE IMPAIRMENTS decreased knowledge of condition, decreased knowledge of use of DME, decreased ROM, decreased strength, and increased edema.   ACTIVITY LIMITATIONS carrying and lifting  PARTICIPATION LIMITATIONS: cleaning, laundry, community activity, and yard work  PERSONAL FACTORS Past/current experiences and 1-2 comorbidities: radiation hx, metastatic status with lymphadenopathy  are also affecting patient's functional outcome.   REHAB POTENTIAL: Good  CLINICAL DECISION MAKING: Evolving/moderate complexity  EVALUATION COMPLEXITY: Moderate  GOALS: Goals reviewed with patient? Yes   LONG TERM GOALS: [place] date: 03/30/22  Pt will obtain appropriate compression garments for management of UE and breast lymphedema Baseline:  Goal status: INITIAL  2.  Pt will decrease Lt UE volume to 275ml or less to demonstrate decreased lymphatic load Baseline:  Goal status: INITIAL  3.  Pt will be ind with HEP to continue cervical and UE mobility Baseline:  Goal status: INITIAL  4.  Pt will be ind with self MLD or use of compression pump Baseline:  Goal status: INITIAL  PLAN: PT FREQUENCY: 3x/week  PT DURATION: 8  weeks  PLANNED INTERVENTIONS: Therapeutic exercises, Patient/Family education, Self Care, DME instructions, Manual lymph drainage, Compression bandaging, Taping, Manual therapy, and Re-evaluation  PLAN FOR NEXT SESSION:  Cont compression bandaging (review this prn) and MLD to the Lt UE/breast/upper quadrant mindful of active cancer in Lt axilla and begin instructing pt having her return demo and issue handout  - send demo for garments - consider pump once treatment plan is established   Stark Bray, PT 02/25/2022, 9:41 PM

## 2022-02-28 ENCOUNTER — Encounter: Payer: Self-pay | Admitting: Rehabilitation

## 2022-02-28 ENCOUNTER — Ambulatory Visit: Payer: Medicare HMO | Admitting: Rehabilitation

## 2022-02-28 DIAGNOSIS — M436 Torticollis: Secondary | ICD-10-CM

## 2022-02-28 DIAGNOSIS — M25612 Stiffness of left shoulder, not elsewhere classified: Secondary | ICD-10-CM

## 2022-02-28 DIAGNOSIS — C3490 Malignant neoplasm of unspecified part of unspecified bronchus or lung: Secondary | ICD-10-CM | POA: Diagnosis not present

## 2022-02-28 DIAGNOSIS — I890 Lymphedema, not elsewhere classified: Secondary | ICD-10-CM

## 2022-02-28 NOTE — Therapy (Signed)
OUTPATIENT PHYSICAL THERAPY ONCOLOGY TREATMENT  Patient Name: Rose Figueroa MRN: [id number] DOB:06-09-1956, 66 y.o., female Today's Date: 02/28/2022   PT End of Session - 02/28/22 2133     Visit Number 6    Number of Visits 19    Date for PT Re-Evaluation 03/30/22    PT Start Time 1010    PT Stop Time 1100    PT Time Calculation (min) 50 min    Activity Tolerance Patient tolerated treatment well    Behavior During Therapy [facility] for tasks assessed/performed                Past Medical History:  Diagnosis Date   Age related osteoporosis 12/18/2020   Anxiety    Atherosclerosis 12/18/2020   Back pain    right back lateral   BCC (basal cell carcinoma of skin)    Bone metastasis 10/04/2017   Cancer of upper lobe of right lung ([place]) 05/19/2015   Chronic pain 12/18/2020   Colon polyps    Complication of anesthesia    pt. states that she is difficult to arouse   Coronary artery calcification of native artery    Coronary atherosclerosis 04/24/2020   Diverticulosis    Dysrhythmia    "skip beat in early 20's"   Encounter for antineoplastic chemotherapy 06/08/2015   Generalized rash 06/24/2015   H/O cold sores    History of anemia    Lung cancer (HCC)    RUL mass -being tx- radiation and chemo- remains last chemo 01-04-16, 2023 left lung Ca   Lung field abnormal 12/18/2020   Metastasis to supraclavicular lymph node ([place]) 10/04/2017   Metastatic non-small cell lung cancer ([place]) 03/24/2020   Mixed dyslipidemia 04/24/2020   Oral lesion 06/24/2015   Osteoporosis    Personal history of colonic polyps 12/18/2020   PONV (postoperative nausea and vomiting)    Port catheter in place 11/20/2015   Rectal bleeding    right upper lobe lung mass 04/15/2015   malignant tumor found - 10'16-radiation and chemotherapy and remains with chemo at present- Dr. Earlie Server follows.   Sciatica 12/18/2020   Tricuspid regurgitation    Vitamin D deficiency 12/18/2020   Past Surgical History:   Procedure Laterality Date   BREAST EXCISIONAL BIOPSY Right    BREAST EXCISIONAL BIOPSY Right    BREAST EXCISIONAL BIOPSY Right    BREAST LUMPECTOMY WITH RADIOACTIVE SEED LOCALIZATION Right 07/28/2021   Procedure: RIGHT BREAST LUMPECTOMY WITH RADIOACTIVE SEED LOCALIZATION X2;  Surgeon: Stark Klein, MD;  Location: [place];  Service: General;  Laterality: Right;  60 MINUTES ROOM 2   COLONOSCOPY     COLONOSCOPY W/ POLYPECTOMY     COLONOSCOPY WITH PROPOFOL N/A 01/20/2016   Procedure: COLONOSCOPY WITH PROPOFOL;  Surgeon: Arta Silence, MD;  Location: [facility];  Service: Endoscopy;  Laterality: N/A;   CYSTECTOMY     right breast x 2   IR REMOVAL TUN ACCESS W/ PORT W/O FL MOD SED  12/12/2018   IR THORACENTESIS ASP PLEURAL SPACE W/IMG GUIDE  01/27/2022   MEDIASTINOSCOPY N/A 05/07/2015   Procedure: MEDIASTINOSCOPY;  Surgeon: Ivin Poot, MD;  Location: [place];  Service: Thoracic;  Laterality: N/A;   TONSILLECTOMY     VIDEO BRONCHOSCOPY WITH ENDOBRONCHIAL ULTRASOUND N/A 05/07/2015   Procedure: VIDEO BRONCHOSCOPY WITH ENDOBRONCHIAL ULTRASOUND;  Surgeon: Ivin Poot, MD;  Location: [place];  Service: Thoracic;  Laterality: N/A;   Patient Active Problem List   Diagnosis Date Noted   Breast cancer metastasized to  axillary lymph node, left ([place]) 01/29/2022   Atherosclerosis 12/18/2020   Chronic pain 12/18/2020   Lung field abnormal 12/18/2020   Personal history of colonic polyps 12/18/2020   Sciatica 12/18/2020   Vitamin D deficiency 12/18/2020   Age related osteoporosis 12/18/2020   Lung cancer ([place])    Anxiety    Back pain    BCC (basal cell carcinoma of skin)    Colon polyps    Complication of anesthesia    Diverticulosis    Dysrhythmia    H/O cold sores    History of anemia    Osteoporosis    PONV (postoperative nausea and vomiting)    Rectal bleeding    Coronary atherosclerosis 04/24/2020   Mixed dyslipidemia 04/24/2020   Cardiac murmur 04/24/2020   Chest discomfort  04/24/2020   Metastatic non-small cell lung cancer ([place]) 03/24/2020   Bone metastasis 10/04/2017   Metastasis to supraclavicular lymph node ([place]) 10/04/2017   Port catheter in place 11/20/2015   Generalized rash 06/24/2015   Oral lesion 06/24/2015   Encounter for antineoplastic chemotherapy 06/08/2015   Cancer of upper lobe of right lung ([address]) 05/19/2015   right upper lobe lung mass 04/15/2015    PCP: Dr. Crissie Sickles  REFERRING PROVIDER: Dr. Barry Dienes  REFERRING DIAG: I89.0 (ICD-10-CM) - Lymphedema, not elsewhere classified  THERAPY DIAG:    ONSET DATE: 11/2021  Rationale for Evaluation and Treatment Rehabilitation  SUBJECTIVE                                                                                                                                                                                        SUBJECTIVE STATEMENT: Everything is going really well  PERTINENT HISTORY:  Hx of Rt lumpectomy with no LN removed on 07/28/21. Pt declined radiation.  she has been treated for right sided stage IIIB (T2aN3) non small cell lung cancer by Dr. Julien Nordmann. This was dx in 2016. She hs received chemo in 2016 and 2017 and radiation to the left supraclavicular node as well as the lung mass in 2019 and 2021 respectively.   Recent imaging showing enlarging left axillary lymphadenopathy with metastatic breast carcinoma on recent biopsy with increased dermal thickening of the Lt breast and metastatic lesions T2, T3, T6. Also with hx of metastatic pneumothorax on the Rt.    PAIN:  Are you having pain? No    PRECAUTIONS: active cancer left axillary LNs of unknown origin  WEIGHT BEARING RESTRICTIONS wearing boot all the time for Charcot foot  FALLS:  Has patient fallen in last 6 months? No  LIVING ENVIRONMENT: Lives with: lives with their spouse   OCCUPATION: not working  LEISURE: nothing extra  HAND DOMINANCE : right   PRIOR LEVEL OF FUNCTION: Independent  PATIENT GOALS what do  I do with the swelling?   OBJECTIVE  COGNITION:  Overall cognitive status: Within functional limits for tasks assessed   PALPATION: Pitting not assessed today. Tightness in Left supraclavicular and cervical region from past radiation  OBSERVATIONS / OTHER ASSESSMENTS: Lt UE appears larger overall - left breast appears larger   POSTURE: stiff neck movements  UPPER EXTREMITY AROM/PROM:  A/PROM RIGHT   eval   Shoulder extension   Shoulder flexion 143  Shoulder abduction 165  Shoulder internal rotation   Shoulder external rotation     (Blank rows = not tested)  A/PROM LEFT   eval  Shoulder extension   Shoulder flexion 140  Shoulder abduction 165  Shoulder internal rotation   Shoulder external rotation     (Blank rows = not tested)   LYMPHEDEMA ASSESSMENTS:   LANDMARK RIGHT  eval    31   10 cm proximal to olecranon process 28.7   Olecranon process 25.5    23.7   10 cm proximal to ulnar styloid process 20.7   Just proximal to ulnar styloid process 15.1   Across hand at thumb web space 17.5   At base of 2nd digit 6.2   (Blank rows = not tested) 1868ml  LANDMARK LEFT  eval LEFT 02/18/2022 02/25/22 02/28/22  Axilla   34 33.6  15cm proximal to olecranon 33.5 34 32.3 31  10  cm proximal to olecranon process 31.5 30.5 30.1 29  Olecranon process 28.3 26.3 26   15cm proximal to ulnar styloid 26.4 25.7 23.7   10 cm proximal to ulnar styloid process 23 23.2 20.8   Just proximal to ulnar styloid process 16.3 16 15.5   Across hand at thumb web space 18.3 18.1 18.2   At base of 2nd digit 6.0 5.7 6.0   (Blank rows = not tested) 9length 2251ml on eval 1955ml on 02/25/22 Difference of 332ml then 26ml on 02/25/22  TODAY'S TREATMENT  02/28/22 Manual Therapy  Manual Lymph Drainage: In Supine (avoided short neck due to radiation fibrosis), 5 diaphragmatic breaths, Lt inguinal and Rt axillary nodes, Lt axillo-inguinal and anterior inter-axillary anastomosis, then focused on Lt UE  working from upper lateral arm, medial to lateral, then lateral again, next worked on Manufacturing systems engineer and post forearm , spending time on anterior aspect where increase fluid noted, briefly on dorsum of hand then retraced all steps ending with LN's. Then in right sidelying posterior scapular work.  Compression Bandaging: Cocoa butter applied to Lt UE, next TG soft, then Elastomull to fingers 1-5, Artiflex with foam at cubital fossa, then 1-6 cm to hand, and 1-10 and 12 cm short stretch bandages from wrist to axilla reviewing correct technique with pt while applying.   02/25/22 Manual Therapy Measured pt for sigvaris secure flat knit garment size S3 and gauntlet size Small with ordering info given to patient.   Reviewed how to order more bandaging supplies with handout Gave handout on self bandaging per pt request Brief Manual Lymph Drainage: In Supine (avoided short neck due to radiation fibrosis), 5 diaphragmatic breaths, Lt inguinal and Rt axillary nodes, Lt axillo-inguinal and anterior inter-axillary anastomosis, then focused on Lt UE working from upper lateral arm, medial to latera, then lateral again, next worked on Manufacturing systems engineer and post forearm , spending time on anterior aspect where increase fluid noted, briefly on dorsum of hand then retraced all steps ending with LN's.  Compression Bandaging: Cocoa butter applied to Lt UE, next TG soft, then Elastomull to fingers 1-4, Artiflex with foam at cubital fossa, then 1-6 cm to hand, and 1-10 and 12 cm short stretch bandages from wrist to axilla reviewing correct technique with pt while applying.   02/21/22: Pt comes in with bandages intact that her husband had applied. Offered critique while removing as bandages had been wrapped up but then back down her arm (the 10 and 12 cm ) Manual Therapy Manual Lymph Drainage: In Supine (avoided short neck due to radiation fibrosis), 5 diaphragmatic breaths, Lt inguinal and Rt axillary nodes, Lt axillo-inguinal and anterior  inter-axillary anastomosis, then focused on Lt UE working from upper lateral arm, medial to latera, then lateral again, next worked on Manufacturing systems engineer and post forearm , spending time on anterior aspect where increase fluid noted, briefly on dorsum of hand then retraced all steps ending with LN's. Reviewed basics of anatomy of lymphatic system and basic principles of MLD while performing, especially importance of light pressure and skin stretch, not slide. Compression Bandaging: Cocoa butter applied to Lt UE, next TG soft, then Elastomull to fingers 1-4, Artiflex with foam at cubital fossa, then 1-6 cm to hand, and 1-10 and 12 cm short stretch bandages from wrist to axilla reviewing correct technique with pt while applying.   PATIENT EDUCATION:  Education details: lymphedema and treatment options, POC Person educated: Patient Education method: Explanation Education comprehension: verbalized understanding, returned demonstration, verbal cues required, tactile cues required, and needs further education  HOME EXERCISE PROGRAM: Elevation, self bandaging, self MLD  ASSESSMENT:  CLINICAL IMPRESSION: Pt is doing well with CDT, has ordered garments, and just needs self MLD education and handouts.   OBJECTIVE IMPAIRMENTS decreased knowledge of condition, decreased knowledge of use of DME, decreased ROM, decreased strength, and increased edema.   ACTIVITY LIMITATIONS carrying and lifting  PARTICIPATION LIMITATIONS: cleaning, laundry, community activity, and yard work  PERSONAL FACTORS Past/current experiences and 1-2 comorbidities: radiation hx, metastatic status with lymphadenopathy  are also affecting patient's functional outcome.   REHAB POTENTIAL: Good  CLINICAL DECISION MAKING: Evolving/moderate complexity  EVALUATION COMPLEXITY: Moderate  GOALS: Goals reviewed with patient? Yes   LONG TERM GOALS: Target date: 03/30/22  Pt will obtain appropriate compression garments for management of UE and breast  lymphedema Baseline:  Goal status: INITIAL  2.  Pt will decrease Lt UE volume to 26ml or less to demonstrate decreased lymphatic load Baseline:  Goal status: INITIAL  3.  Pt will be ind with HEP to continue cervical and UE mobility Baseline:  Goal status: INITIAL  4.  Pt will be ind with self MLD or use of compression pump Baseline:  Goal status: INITIAL  PLAN: PT FREQUENCY: 3x/week  PT DURATION: 8 weeks  PLANNED INTERVENTIONS: Therapeutic exercises, Patient/Family education, Self Care, DME instructions, Manual lymph drainage, Compression bandaging, Taping, Manual therapy, and Re-evaluation  PLAN FOR NEXT SESSION:  Cont compression bandaging (review this prn) and MLD to the Lt UE/breast/upper quadrant mindful of active cancer in Lt axilla and begin instructing pt having her return demo and issue handout of MLD next session   Stark Bray, PT 02/28/2022, 9:34 PM

## 2022-03-02 ENCOUNTER — Ambulatory Visit: Payer: Medicare HMO

## 2022-03-02 DIAGNOSIS — M436 Torticollis: Secondary | ICD-10-CM | POA: Diagnosis not present

## 2022-03-02 DIAGNOSIS — I890 Lymphedema, not elsewhere classified: Secondary | ICD-10-CM

## 2022-03-02 DIAGNOSIS — M25612 Stiffness of left shoulder, not elsewhere classified: Secondary | ICD-10-CM | POA: Diagnosis not present

## 2022-03-02 DIAGNOSIS — C3490 Malignant neoplasm of unspecified part of unspecified bronchus or lung: Secondary | ICD-10-CM

## 2022-03-02 NOTE — Therapy (Signed)
OUTPATIENT PHYSICAL THERAPY ONCOLOGY TREATMENT  Patient Name: Rose Figueroa MRN: [id number] DOB:05-15-1956, 66 y.o., female Today's Date: 03/02/2022   PT End of Session - 03/02/22 1007     Visit Number 7    Number of Visits 19    Date for PT Re-Evaluation 03/30/22    PT Start Time 1003    PT Stop Time 1102    PT Time Calculation (min) 59 min    Activity Tolerance Patient tolerated treatment well    Behavior During Therapy [facility] for tasks assessed/performed                Past Medical History:  Diagnosis Date   Age related osteoporosis 12/18/2020   Anxiety    Atherosclerosis 12/18/2020   Back pain    right back lateral   BCC (basal cell carcinoma of skin)    Bone metastasis 10/04/2017   Cancer of upper lobe of right lung ([place]) 05/19/2015   Chronic pain 12/18/2020   Colon polyps    Complication of anesthesia    pt. states that she is difficult to arouse   Coronary artery calcification of native artery    Coronary atherosclerosis 04/24/2020   Diverticulosis    Dysrhythmia    "skip beat in early 20's"   Encounter for antineoplastic chemotherapy 06/08/2015   Generalized rash 06/24/2015   H/O cold sores    History of anemia    Lung cancer (HCC)    RUL mass -being tx- radiation and chemo- remains last chemo 01-04-16, 2023 left lung Ca   Lung field abnormal 12/18/2020   Metastasis to supraclavicular lymph node ([place]) 10/04/2017   Metastatic non-small cell lung cancer ([place]) 03/24/2020   Mixed dyslipidemia 04/24/2020   Oral lesion 06/24/2015   Osteoporosis    Personal history of colonic polyps 12/18/2020   PONV (postoperative nausea and vomiting)    Port catheter in place 11/20/2015   Rectal bleeding    right upper lobe lung mass 04/15/2015   malignant tumor found - 10'16-radiation and chemotherapy and remains with chemo at present- Dr. Earlie Server follows.   Sciatica 12/18/2020   Tricuspid regurgitation    Vitamin D deficiency 12/18/2020   Past Surgical History:   Procedure Laterality Date   BREAST EXCISIONAL BIOPSY Right    BREAST EXCISIONAL BIOPSY Right    BREAST EXCISIONAL BIOPSY Right    BREAST LUMPECTOMY WITH RADIOACTIVE SEED LOCALIZATION Right 07/28/2021   Procedure: RIGHT BREAST LUMPECTOMY WITH RADIOACTIVE SEED LOCALIZATION X2;  Surgeon: Stark Klein, MD;  Location: [place];  Service: General;  Laterality: Right;  60 MINUTES ROOM 2   COLONOSCOPY     COLONOSCOPY W/ POLYPECTOMY     COLONOSCOPY WITH PROPOFOL N/A 01/20/2016   Procedure: COLONOSCOPY WITH PROPOFOL;  Surgeon: Arta Silence, MD;  [facility];  Service: Endoscopy;  Laterality: N/A;   CYSTECTOMY     right breast x 2   IR REMOVAL TUN ACCESS W/ PORT W/O FL MOD SED  12/12/2018   IR THORACENTESIS ASP PLEURAL SPACE W/IMG GUIDE  01/27/2022   MEDIASTINOSCOPY N/A 05/07/2015   Procedure: MEDIASTINOSCOPY;  Surgeon: Ivin Poot, MD;  Location: [place];  Service: Thoracic;  Laterality: N/A;   TONSILLECTOMY     VIDEO BRONCHOSCOPY WITH ENDOBRONCHIAL ULTRASOUND N/A 05/07/2015   Procedure: VIDEO BRONCHOSCOPY WITH ENDOBRONCHIAL ULTRASOUND;  Surgeon: Ivin Poot, MD;  Location: [place];  Service: Thoracic;  Laterality: N/A;   Patient Active Problem List   Diagnosis Date Noted   Breast cancer metastasized to  axillary lymph node, left ([place]) 01/29/2022   Atherosclerosis 12/18/2020   Chronic pain 12/18/2020   Lung field abnormal 12/18/2020   Personal history of colonic polyps 12/18/2020   Sciatica 12/18/2020   Vitamin D deficiency 12/18/2020   Age related osteoporosis 12/18/2020   Lung cancer ([place])    Anxiety    Back pain    BCC (basal cell carcinoma of skin)    Colon polyps    Complication of anesthesia    Diverticulosis    Dysrhythmia    H/O cold sores    History of anemia    Osteoporosis    PONV (postoperative nausea and vomiting)    Rectal bleeding    Coronary atherosclerosis 04/24/2020   Mixed dyslipidemia 04/24/2020   Cardiac murmur 04/24/2020   Chest discomfort  04/24/2020   Metastatic non-small cell lung cancer ([place]) 03/24/2020   Bone metastasis 10/04/2017   Metastasis to supraclavicular lymph node ([place]) 10/04/2017   Port catheter in place 11/20/2015   Generalized rash 06/24/2015   Oral lesion 06/24/2015   Encounter for antineoplastic chemotherapy 06/08/2015   Cancer of upper lobe of right lung ([place]) 05/19/2015   right upper lobe lung mass 04/15/2015    PCP: Dr. Crissie Sickles  REFERRING PROVIDER: Dr. Barry Dienes  REFERRING DIAG: I89.0 (ICD-10-CM) - Lymphedema, not elsewhere classified  THERAPY DIAG:    ONSET DATE: 11/2021  Rationale for Evaluation and Treatment Rehabilitation  SUBJECTIVE                                                                                                                                                                                        SUBJECTIVE STATEMENT: My sister is visiting and wrapped my arm last night. It was falling off this morning but my arm is smaller.   PERTINENT HISTORY:  Hx of Rt lumpectomy with no LN removed on 07/28/21. Pt declined radiation.  she has been treated for right sided stage IIIB (T2aN3) non small cell lung cancer by Dr. Julien Nordmann. This was dx in 2016. She hs received chemo in 2016 and 2017 and radiation to the left supraclavicular node as well as the lung mass in 2019 and 2021 respectively.   Recent imaging showing enlarging left axillary lymphadenopathy with metastatic breast carcinoma on recent biopsy with increased dermal thickening of the Lt breast and metastatic lesions T2, T3, T6. Also with hx of metastatic pneumothorax on the Rt.    PAIN:  Are you having pain? No    PRECAUTIONS: active cancer left axillary LNs of unknown origin  WEIGHT BEARING RESTRICTIONS wearing boot all the time for Charcot foot  FALLS:  Has patient fallen in last 6  months? No  LIVING ENVIRONMENT: Lives with: lives with their spouse   OCCUPATION: not working  LEISURE: nothing extra  HAND  DOMINANCE : right   PRIOR LEVEL OF FUNCTION: Independent  PATIENT GOALS what do I do with the swelling?   OBJECTIVE  COGNITION:  Overall cognitive status: Within functional limits for tasks assessed   PALPATION: Pitting not assessed today. Tightness in Left supraclavicular and cervical region from past radiation  OBSERVATIONS / OTHER ASSESSMENTS: Lt UE appears larger overall - left breast appears larger   POSTURE: stiff neck movements  UPPER EXTREMITY AROM/PROM:  A/PROM RIGHT   eval   Shoulder extension   Shoulder flexion 143  Shoulder abduction 165  Shoulder internal rotation   Shoulder external rotation     (Blank rows = not tested)  A/PROM LEFT   eval  Shoulder extension   Shoulder flexion 140  Shoulder abduction 165  Shoulder internal rotation   Shoulder external rotation     (Blank rows = not tested)   LYMPHEDEMA ASSESSMENTS:   LANDMARK RIGHT  eval    31   10 cm proximal to olecranon process 28.7   Olecranon process 25.5    23.7   10 cm proximal to ulnar styloid process 20.7   Just proximal to ulnar styloid process 15.1   Across hand at thumb web space 17.5   At base of 2nd digit 6.2   (Blank rows = not tested) 1841ml  LANDMARK LEFT  eval LEFT 02/18/2022 02/25/22 02/28/22  Axilla   34 33.6  15cm proximal to olecranon 33.5 34 32.3 31  10  cm proximal to olecranon process 31.5 30.5 30.1 29  Olecranon process 28.3 26.3 26   15cm proximal to ulnar styloid 26.4 25.7 23.7   10 cm proximal to ulnar styloid process 23 23.2 20.8   Just proximal to ulnar styloid process 16.3 16 15.5   Across hand at thumb web space 18.3 18.1 18.2   At base of 2nd digit 6.0 5.7 6.0   (Blank rows = not tested) 9length 2247ml on eval 1915ml on 02/25/22 Difference of 362ml then 57ml on 02/25/22  TODAY'S TREATMENT  03/02/22 Manual Therapy  Manual Lymph Drainage: In Supine (avoided short neck due to radiation fibrosis), 5 diaphragmatic breaths, Lt inguinal and Rt axillary nodes,  Lt axillo-inguinal and anterior inter-axillary anastomosis, then focused on Lt UE working from upper lateral arm, medial to lateral, then lateral again, next worked on Manufacturing systems engineer and post forearm , spending time on anterior aspect where increase fluid noted, briefly on dorsum of hand then retraced all steps ending with LN's.  Compression Bandaging: Cocoa butter applied to Lt UE, next TG soft, then Elastomull to fingers 1-5, Artiflex with foam at cubital fossa, then 1-6 cm to hand, and 1-10 and 12 cm short stretch bandages from wrist to axilla   02/28/22 Manual Therapy  Manual Lymph Drainage: In Supine (avoided short neck due to radiation fibrosis), 5 diaphragmatic breaths, Lt inguinal and Rt axillary nodes, Lt axillo-inguinal and anterior inter-axillary anastomosis, then focused on Lt UE working from upper lateral arm, medial to lateral, then lateral again, next worked on Manufacturing systems engineer and post forearm , spending time on anterior aspect where increase fluid noted, briefly on dorsum of hand then retraced all steps ending with LN's. Then in right sidelying posterior scapular work.  Compression Bandaging: Cocoa butter applied to Lt UE, next TG soft, then Elastomull to fingers 1-5, Artiflex with foam at cubital fossa, then 1-6 cm to hand, and  1-10 and 12 cm short stretch bandages from wrist to axilla reviewing correct technique with pt while applying.   02/25/22 Manual Therapy Measured pt for sigvaris secure flat knit garment size S3 and gauntlet size Small with ordering info given to patient.   Reviewed how to order more bandaging supplies with handout Gave handout on self bandaging per pt request Brief Manual Lymph Drainage: In Supine (avoided short neck due to radiation fibrosis), 5 diaphragmatic breaths, Lt inguinal and Rt axillary nodes, Lt axillo-inguinal and anterior inter-axillary anastomosis, then focused on Lt UE working from upper lateral arm, medial to latera, then lateral again, next worked on Manufacturing systems engineer and post forearm  , spending time on anterior aspect where increase fluid noted, briefly on dorsum of hand then retraced all steps ending with LN's.  Compression Bandaging: Cocoa butter applied to Lt UE, next TG soft, then Elastomull to fingers 1-4, Artiflex with foam at cubital fossa, then 1-6 cm to hand, and 1-10 and 12 cm short stretch bandages from wrist to axilla reviewing correct technique with pt while applying.    PATIENT EDUCATION:  Education details: lymphedema and treatment options, POC Person educated: Patient Education method: Explanation Education comprehension: verbalized understanding, returned demonstration, verbal cues required, tactile cues required, and needs further education  HOME EXERCISE PROGRAM: Elevation, self bandaging, self MLD  ASSESSMENT:  CLINICAL IMPRESSION: Pt is doing well with CDT and is awaiting arrival of new compression garments. She will be ready for D/C at this time.   OBJECTIVE IMPAIRMENTS decreased knowledge of condition, decreased knowledge of use of DME, decreased ROM, decreased strength, and increased edema.   ACTIVITY LIMITATIONS carrying and lifting  PARTICIPATION LIMITATIONS: cleaning, laundry, community activity, and yard work  PERSONAL FACTORS Past/current experiences and 1-2 comorbidities: radiation hx, metastatic status with lymphadenopathy  are also affecting patient's functional outcome.   REHAB POTENTIAL: Good  CLINICAL DECISION MAKING: Evolving/moderate complexity  EVALUATION COMPLEXITY: Moderate  GOALS: Goals reviewed with patient? Yes   LONG TERM GOALS: Target date: 03/30/22  Pt will obtain appropriate compression garments for management of UE and breast lymphedema Baseline:  Goal status: INITIAL  2.  Pt will decrease Lt UE volume to 274ml or less to demonstrate decreased lymphatic load Baseline:  Goal status: INITIAL  3.  Pt will be ind with HEP to continue cervical and UE mobility Baseline:  Goal status: INITIAL  4.  Pt will be  ind with self MLD or use of compression pump Baseline:  Goal status: INITIAL  PLAN: PT FREQUENCY: 3x/week  PT DURATION: 8 weeks  PLANNED INTERVENTIONS: Therapeutic exercises, Patient/Family education, Self Care, DME instructions, Manual lymph drainage, Compression bandaging, Taping, Manual therapy, and Re-evaluation  PLAN FOR NEXT SESSION:  Cont compression bandaging (review this prn) and MLD to the Lt UE/breast/upper quadrant mindful of active cancer in Lt axilla and begin instructing pt having her return demo and issue handout of MLD next session   Otelia Limes, PTA 03/02/2022, 11:03 AM

## 2022-03-04 ENCOUNTER — Encounter: Payer: Self-pay | Admitting: Rehabilitation

## 2022-03-04 ENCOUNTER — Ambulatory Visit: Payer: Medicare HMO | Admitting: Rehabilitation

## 2022-03-04 DIAGNOSIS — C3490 Malignant neoplasm of unspecified part of unspecified bronchus or lung: Secondary | ICD-10-CM

## 2022-03-04 DIAGNOSIS — I890 Lymphedema, not elsewhere classified: Secondary | ICD-10-CM | POA: Diagnosis not present

## 2022-03-04 DIAGNOSIS — M436 Torticollis: Secondary | ICD-10-CM

## 2022-03-04 DIAGNOSIS — M25612 Stiffness of left shoulder, not elsewhere classified: Secondary | ICD-10-CM | POA: Diagnosis not present

## 2022-03-04 NOTE — Therapy (Signed)
OUTPATIENT PHYSICAL THERAPY ONCOLOGY TREATMENT  Patient Name: Rose Figueroa MRN: [id number] DOB:21-Jul-1956, 66 y.o., female Today's Date: 03/04/2022   PT End of Session - 03/04/22 1052     Visit Number 8    Number of Visits 19    Date for PT Re-Evaluation 03/30/22    PT Start Time 1003    PT Stop Time 1050    PT Time Calculation (min) 47 min    Activity Tolerance Patient tolerated treatment well    Behavior During Therapy [facility] for tasks assessed/performed                 Past Medical History:  Diagnosis Date   Age related osteoporosis 12/18/2020   Anxiety    Atherosclerosis 12/18/2020   Back pain    right back lateral   BCC (basal cell carcinoma of skin)    Bone metastasis 10/04/2017   Cancer of upper lobe of right lung ([place]) 05/19/2015   Chronic pain 12/18/2020   Colon polyps    Complication of anesthesia    pt. states that she is difficult to arouse   Coronary artery calcification of native artery    Coronary atherosclerosis 04/24/2020   Diverticulosis    Dysrhythmia    "skip beat in early 20's"   Encounter for antineoplastic chemotherapy 06/08/2015   Generalized rash 06/24/2015   H/O cold sores    History of anemia    Lung cancer (HCC)    RUL mass -being tx- radiation and chemo- remains last chemo 01-04-16, 2023 left lung Ca   Lung field abnormal 12/18/2020   Metastasis to supraclavicular lymph node ([place]) 10/04/2017   Metastatic non-small cell lung cancer ([place]) 03/24/2020   Mixed dyslipidemia 04/24/2020   Oral lesion 06/24/2015   Osteoporosis    Personal history of colonic polyps 12/18/2020   PONV (postoperative nausea and vomiting)    Port catheter in place 11/20/2015   Rectal bleeding    right upper lobe lung mass 04/15/2015   malignant tumor found - 10'16-radiation and chemotherapy and remains with chemo at present- Dr. Earlie Server follows.   Sciatica 12/18/2020   Tricuspid regurgitation    Vitamin D deficiency 12/18/2020   Past Surgical  History:  Procedure Laterality Date   BREAST EXCISIONAL BIOPSY Right    BREAST EXCISIONAL BIOPSY Right    BREAST EXCISIONAL BIOPSY Right    BREAST LUMPECTOMY WITH RADIOACTIVE SEED LOCALIZATION Right 07/28/2021   Procedure: RIGHT BREAST LUMPECTOMY WITH RADIOACTIVE SEED LOCALIZATION X2;  Surgeon: Stark Klein, MD;  Location: [place];  Service: General;  Laterality: Right;  60 MINUTES ROOM 2   COLONOSCOPY     COLONOSCOPY W/ POLYPECTOMY     COLONOSCOPY WITH PROPOFOL N/A 01/20/2016   Procedure: COLONOSCOPY WITH PROPOFOL;  Surgeon: Arta Silence, MD;  [facility];  Service: Endoscopy;  Laterality: N/A;   CYSTECTOMY     right breast x 2   IR REMOVAL TUN ACCESS W/ PORT W/O FL MOD SED  12/12/2018   IR THORACENTESIS ASP PLEURAL SPACE W/IMG GUIDE  01/27/2022   MEDIASTINOSCOPY N/A 05/07/2015   Procedure: MEDIASTINOSCOPY;  Surgeon: Ivin Poot, MD;  Location: [place];  Service: Thoracic;  Laterality: N/A;   TONSILLECTOMY     VIDEO BRONCHOSCOPY WITH ENDOBRONCHIAL ULTRASOUND N/A 05/07/2015   Procedure: VIDEO BRONCHOSCOPY WITH ENDOBRONCHIAL ULTRASOUND;  Surgeon: Ivin Poot, MD;  Location: [place];  Service: Thoracic;  Laterality: N/A;   Patient Active Problem List   Diagnosis Date Noted   Breast cancer metastasized  to axillary lymph node, left ([place]) 01/29/2022   Atherosclerosis 12/18/2020   Chronic pain 12/18/2020   Lung field abnormal 12/18/2020   Personal history of colonic polyps 12/18/2020   Sciatica 12/18/2020   Vitamin D deficiency 12/18/2020   Age related osteoporosis 12/18/2020   Lung cancer ([place])    Anxiety    Back pain    BCC (basal cell carcinoma of skin)    Colon polyps    Complication of anesthesia    Diverticulosis    Dysrhythmia    H/O cold sores    History of anemia    Osteoporosis    PONV (postoperative nausea and vomiting)    Rectal bleeding    Coronary atherosclerosis 04/24/2020   Mixed dyslipidemia 04/24/2020   Cardiac murmur 04/24/2020   Chest discomfort  04/24/2020   Metastatic non-small cell lung cancer ([place]) 03/24/2020   Bone metastasis 10/04/2017   Metastasis to supraclavicular lymph node ([place]) 10/04/2017   Port catheter in place 11/20/2015   Generalized rash 06/24/2015   Oral lesion 06/24/2015   Encounter for antineoplastic chemotherapy 06/08/2015   Cancer of upper lobe of right lung ([place]) 05/19/2015   right upper lobe lung mass 04/15/2015    PCP: Dr. Crissie Sickles  REFERRING PROVIDER: Dr. Barry Dienes  REFERRING DIAG: I89.0 (ICD-10-CM) - Lymphedema, not elsewhere classified  THERAPY DIAG:    ONSET DATE: 11/2021  Rationale for Evaluation and Treatment Rehabilitation  SUBJECTIVE                                                                                                                                                                                        SUBJECTIVE STATEMENT: My sister is visiting and wrapped my arm last night. It was falling off this morning but my arm is smaller.   PERTINENT HISTORY:  Hx of Rt lumpectomy with no LN removed on 07/28/21. Pt declined radiation.  she has been treated for right sided stage IIIB (T2aN3) non small cell lung cancer by Dr. Julien Nordmann. This was dx in 2016. She hs received chemo in 2016 and 2017 and radiation to the left supraclavicular node as well as the lung mass in 2019 and 2021 respectively.   Recent imaging showing enlarging left axillary lymphadenopathy with metastatic breast carcinoma on recent biopsy with increased dermal thickening of the Lt breast and metastatic lesions T2, T3, T6. Also with hx of metastatic pneumothorax on the Rt.    PAIN:  Are you having pain? No   PRECAUTIONS: active cancer left axillary LNs of unknown origin  WEIGHT BEARING RESTRICTIONS wearing boot all the time for Charcot foot  FALLS:  Has patient fallen in last 6  months? No  LIVING ENVIRONMENT: Lives with: lives with their spouse  OCCUPATION: not working  LEISURE: nothing extra  HAND DOMINANCE :  right   PRIOR LEVEL OF FUNCTION: Independent  PATIENT GOALS what do I do with the swelling?   OBJECTIVE  COGNITION:  Overall cognitive status: Within functional limits for tasks assessed   PALPATION: Pitting not assessed today. Tightness in Left supraclavicular and cervical region from past radiation  OBSERVATIONS / OTHER ASSESSMENTS: Lt UE appears larger overall - left breast appears larger   POSTURE: stiff neck movements  UPPER EXTREMITY AROM/PROM:  A/PROM RIGHT   eval   Shoulder extension   Shoulder flexion 143  Shoulder abduction 165  Shoulder internal rotation   Shoulder external rotation     (Blank rows = not tested)  A/PROM LEFT   eval  Shoulder extension   Shoulder flexion 140  Shoulder abduction 165  Shoulder internal rotation   Shoulder external rotation     (Blank rows = not tested)   LYMPHEDEMA ASSESSMENTS:   LANDMARK RIGHT  eval    31   10 cm proximal to olecranon process 28.7   Olecranon process 25.5    23.7   10 cm proximal to ulnar styloid process 20.7   Just proximal to ulnar styloid process 15.1   Across hand at thumb web space 17.5   At base of 2nd digit 6.2   (Blank rows = not tested) 1870ml  LANDMARK LEFT  eval LEFT 02/18/2022 02/25/22 02/28/22  Axilla   34 33.6  15cm proximal to olecranon 33.5 34 32.3 31  10  cm proximal to olecranon process 31.5 30.5 30.1 29  Olecranon process 28.3 26.3 26   15cm proximal to ulnar styloid 26.4 25.7 23.7   10 cm proximal to ulnar styloid process 23 23.2 20.8   Just proximal to ulnar styloid process 16.3 16 15.5   Across hand at thumb web space 18.3 18.1 18.2   At base of 2nd digit 6.0 5.7 6.0   (Blank rows = not tested) 9length 226ml on eval 1944ml on 02/25/22 Difference of 325ml then 47ml on 02/25/22  TODAY'S TREATMENT  03/04/22 Self care Pt arrives with new garments - donning education for these which seem to fit well but may be slightly short and slightly larger at the wrist but overall pt  feel like she would not like anything longer.  Pt donned with gloves for practice and did well.  Pt is instructed to wear these as able over the weekend and continuing bandaging at night.  We will assess for good fit on Monday or any returns needed.   Education on self MLD with performance of each step after PT demo and instruction.  Given handout.  Excellent performance pt pt.   03/02/22 Manual Therapy  Manual Lymph Drainage: In Supine (avoided short neck due to radiation fibrosis), 5 diaphragmatic breaths, Lt inguinal and Rt axillary nodes, Lt axillo-inguinal and anterior inter-axillary anastomosis, then focused on Lt UE working from upper lateral arm, medial to lateral, then lateral again, next worked on Manufacturing systems engineer and post forearm , spending time on anterior aspect where increase fluid noted, briefly on dorsum of hand then retraced all steps ending with LN's.  Compression Bandaging: Cocoa butter applied to Lt UE, next TG soft, then Elastomull to fingers 1-5, Artiflex with foam at cubital fossa, then 1-6 cm to hand, and 1-10 and 12 cm short stretch bandages from wrist to axilla   02/28/22 Manual Therapy  Manual Lymph Drainage:  In Supine (avoided short neck due to radiation fibrosis), 5 diaphragmatic breaths, Lt inguinal and Rt axillary nodes, Lt axillo-inguinal and anterior inter-axillary anastomosis, then focused on Lt UE working from upper lateral arm, medial to lateral, then lateral again, next worked on Manufacturing systems engineer and post forearm , spending time on anterior aspect where increase fluid noted, briefly on dorsum of hand then retraced all steps ending with LN's. Then in right sidelying posterior scapular work.  Compression Bandaging: Cocoa butter applied to Lt UE, next TG soft, then Elastomull to fingers 1-5, Artiflex with foam at cubital fossa, then 1-6 cm to hand, and 1-10 and 12 cm short stretch bandages from wrist to axilla reviewing correct technique with pt while applying.   PATIENT EDUCATION:  Education  details: lymphedema and treatment options, POC Person educated: Patient Education method: Explanation Education comprehension: verbalized understanding, returned demonstration, verbal cues required, tactile cues required, and needs further education  HOME EXERCISE PROGRAM: Elevation, self bandaging, self MLD  ASSESSMENT:  CLINICAL IMPRESSION: Will assess garments on Monday and then maybe follow up 2-4 weeks of use.   OBJECTIVE IMPAIRMENTS decreased knowledge of condition, decreased knowledge of use of DME, decreased ROM, decreased strength, and increased edema.   ACTIVITY LIMITATIONS carrying and lifting  PARTICIPATION LIMITATIONS: cleaning, laundry, community activity, and yard work  PERSONAL FACTORS Past/current experiences and 1-2 comorbidities: radiation hx, metastatic status with lymphadenopathy  are also affecting patient's functional outcome.   REHAB POTENTIAL: Good  CLINICAL DECISION MAKING: Evolving/moderate complexity  EVALUATION COMPLEXITY: Moderate  GOALS: Goals reviewed with patient? Yes   LONG TERM GOALS: Target date: 03/30/22  Pt will obtain appropriate compression garments for management of UE and breast lymphedema Baseline:  Goal status: INITIAL  2.  Pt will decrease Lt UE volume to 279ml or less to demonstrate decreased lymphatic load Baseline:  Goal status: INITIAL  3.  Pt will be ind with HEP to continue cervical and UE mobility Baseline:  Goal status: INITIAL  4.  Pt will be ind with self MLD or use of compression pump Baseline:  Goal status: INITIAL  PLAN: PT FREQUENCY: 3x/week  PT DURATION: 8 weeks  PLANNED INTERVENTIONS: Therapeutic exercises, Patient/Family education, Self Care, DME instructions, Manual lymph drainage, Compression bandaging, Taping, Manual therapy, and Re-evaluation  PLAN FOR NEXT SESSION:  Cont compression bandaging (review this prn) and MLD to the Lt UE/breast/upper quadrant mindful of active cancer in Lt axilla and  begin instructing pt having her return demo and issue handout of MLD next session   Stark Bray, PT 03/04/2022, 10:53 AM

## 2022-03-04 NOTE — Patient Instructions (Signed)
Deep Effective Breath   1.) Standing, sitting, or laying down, place both hands on the belly. Take a deep breath IN, expanding the belly; then breath OUT, contracting the belly. 5 breaths.   Axilla to Axilla - Sweep   2.) On both sides make 5 circles in the armpits  3.) pump _5__ times from involved armpit across chest to uninvolved armpit, making a pathway.    Copyright  VHI. All rights reserved.  Axilla to Inguinal Nodes - Sweep   4.) On Left side, make 5 circles at groin at panty line  5.)  then pump _5__ times from armpit along side of trunk to outer hip, making your other pathway.    Copyright  VHI. All rights reserved.  Arm Posterior: Elbow to Shoulder - Sweep   6.) Pump _5__ times from back of elbow to top of shoulder.   7.) Then inner to outer upper arm _5_ times   ARM: Volar Wrist to Elbow - Sweep   8.) Pump or stationary circles _5__ times from wrist to elbow making sure to do both sides of the forearm.   Copyright  VHI. All rights reserved.  ARM: Dorsum of Hand to Shoulder - Sweep   9.) Pump or stationary circles _5__ times on back of hand including knuckle spaces and individual fingers if needed working up towards the wrist,   10.) then retrace all your steps working back up the forearm, doing both sides; upper outer arm and back to your pathways _2-3_ times each. Then do 5 circles again at uninvolved armpit and involved groin where you started! Good job!!  Do __1_ time per day.  Copyright  VHI. All rights reserved.

## 2022-03-07 ENCOUNTER — Other Ambulatory Visit: Payer: Self-pay | Admitting: Medical Oncology

## 2022-03-07 ENCOUNTER — Ambulatory Visit: Payer: Medicare HMO | Admitting: Rehabilitation

## 2022-03-07 DIAGNOSIS — M25612 Stiffness of left shoulder, not elsewhere classified: Secondary | ICD-10-CM | POA: Diagnosis not present

## 2022-03-07 DIAGNOSIS — M436 Torticollis: Secondary | ICD-10-CM

## 2022-03-07 DIAGNOSIS — C3490 Malignant neoplasm of unspecified part of unspecified bronchus or lung: Secondary | ICD-10-CM | POA: Diagnosis not present

## 2022-03-07 DIAGNOSIS — C3411 Malignant neoplasm of upper lobe, right bronchus or lung: Secondary | ICD-10-CM

## 2022-03-07 DIAGNOSIS — I890 Lymphedema, not elsewhere classified: Secondary | ICD-10-CM

## 2022-03-07 NOTE — Therapy (Signed)
OUTPATIENT PHYSICAL THERAPY ONCOLOGY TREATMENT  Patient Name: Rose Figueroa MRN: [id number] DOB:06-29-1956, 66 y.o., female Today's Date: 03/07/2022   PT End of Session - 03/07/22 1155     Visit Number 9    Number of Visits 19    Date for PT Re-Evaluation 03/30/22    PT Start Time 1000    PT Stop Time 1059    PT Time Calculation (min) 59 min    Activity Tolerance Patient tolerated treatment well    Behavior During Therapy [facility] for tasks assessed/performed                  Past Medical History:  Diagnosis Date   Age related osteoporosis 12/18/2020   Anxiety    Atherosclerosis 12/18/2020   Back pain    right back lateral   BCC (basal cell carcinoma of skin)    Bone metastasis 10/04/2017   Cancer of upper lobe of right lung ([place]) 05/19/2015   Chronic pain 12/18/2020   Colon polyps    Complication of anesthesia    pt. states that she is difficult to arouse   Coronary artery calcification of native artery    Coronary atherosclerosis 04/24/2020   Diverticulosis    Dysrhythmia    "skip beat in early 20's"   Encounter for antineoplastic chemotherapy 06/08/2015   Generalized rash 06/24/2015   H/O cold sores    History of anemia    Lung cancer (HCC)    RUL mass -being tx- radiation and chemo- remains last chemo 01-04-16, 2023 left lung Ca   Lung field abnormal 12/18/2020   Metastasis to supraclavicular lymph node ([place]) 10/04/2017   Metastatic non-small cell lung cancer ([place]) 03/24/2020   Mixed dyslipidemia 04/24/2020   Oral lesion 06/24/2015   Osteoporosis    Personal history of colonic polyps 12/18/2020   PONV (postoperative nausea and vomiting)    Port catheter in place 11/20/2015   Rectal bleeding    right upper lobe lung mass 04/15/2015   malignant tumor found - 10'16-radiation and chemotherapy and remains with chemo at present- Dr. Earlie Server follows.   Sciatica 12/18/2020   Tricuspid regurgitation    Vitamin D deficiency 12/18/2020   Past Surgical  History:  Procedure Laterality Date   BREAST EXCISIONAL BIOPSY Right    BREAST EXCISIONAL BIOPSY Right    BREAST EXCISIONAL BIOPSY Right    BREAST LUMPECTOMY WITH RADIOACTIVE SEED LOCALIZATION Right 07/28/2021   Procedure: RIGHT BREAST LUMPECTOMY WITH RADIOACTIVE SEED LOCALIZATION X2;  Surgeon: Stark Klein, MD;  Location: [place];  Service: General;  Laterality: Right;  60 MINUTES ROOM 2   COLONOSCOPY     COLONOSCOPY W/ POLYPECTOMY     COLONOSCOPY WITH PROPOFOL N/A 01/20/2016   Procedure: COLONOSCOPY WITH PROPOFOL;  Surgeon: Arta Silence, MD;  Location: [facility];  Service: Endoscopy;  Laterality: N/A;   CYSTECTOMY     right breast x 2   IR REMOVAL TUN ACCESS W/ PORT W/O FL MOD SED  12/12/2018   IR THORACENTESIS ASP PLEURAL SPACE W/IMG GUIDE  01/27/2022   MEDIASTINOSCOPY N/A 05/07/2015   Procedure: MEDIASTINOSCOPY;  Surgeon: Ivin Poot, MD;  Location: [place];  Service: Thoracic;  Laterality: N/A;   TONSILLECTOMY     VIDEO BRONCHOSCOPY WITH ENDOBRONCHIAL ULTRASOUND N/A 05/07/2015   Procedure: VIDEO BRONCHOSCOPY WITH ENDOBRONCHIAL ULTRASOUND;  Surgeon: Ivin Poot, MD;  Location: [place];  Service: Thoracic;  Laterality: N/A;   Patient Active Problem List   Diagnosis Date Noted   Breast cancer  metastasized to axillary lymph node, left ([place]) 01/29/2022   Atherosclerosis 12/18/2020   Chronic pain 12/18/2020   Lung field abnormal 12/18/2020   Personal history of colonic polyps 12/18/2020   Sciatica 12/18/2020   Vitamin D deficiency 12/18/2020   Age related osteoporosis 12/18/2020   Lung cancer ([place])    Anxiety    Back pain    BCC (basal cell carcinoma of skin)    Colon polyps    Complication of anesthesia    Diverticulosis    Dysrhythmia    H/O cold sores    History of anemia    Osteoporosis    PONV (postoperative nausea and vomiting)    Rectal bleeding    Coronary atherosclerosis 04/24/2020   Mixed dyslipidemia 04/24/2020   Cardiac murmur 04/24/2020   Chest discomfort  04/24/2020   Metastatic non-small cell lung cancer ([place]) 03/24/2020   Bone metastasis 10/04/2017   Metastasis to supraclavicular lymph node ([place]) 10/04/2017   Port catheter in place 11/20/2015   Generalized rash 06/24/2015   Oral lesion 06/24/2015   Encounter for antineoplastic chemotherapy 06/08/2015   Cancer of upper lobe of right lung ([place]) 05/19/2015   right upper lobe lung mass 04/15/2015    PCP: Dr. Crissie Sickles  REFERRING PROVIDER: Dr. Barry Dienes  REFERRING DIAG: I89.0 (ICD-10-CM) - Lymphedema, not elsewhere classified  THERAPY DIAG:    ONSET DATE: 11/2021  Rationale for Evaluation and Treatment Rehabilitation  SUBJECTIVE                                                                                                                                                                                        SUBJECTIVE STATEMENT: The sleeve and gauntlet worked well this weekend.  I did have some of that redness in the elbow but it is not bad.  I also have bandaid on my thumb because I got a little blister there from wrapping.   PERTINENT HISTORY:  Hx of Rt lumpectomy with no LN removed on 07/28/21. Pt declined radiation.  she has been treated for right sided stage IIIB (T2aN3) non small cell lung cancer by Dr. Julien Nordmann. This was dx in 2016. She hs received chemo in 2016 and 2017 and radiation to the left supraclavicular node as well as the lung mass in 2019 and 2021 respectively.   Recent imaging showing enlarging left axillary lymphadenopathy with metastatic breast carcinoma on recent biopsy with increased dermal thickening of the Lt breast and metastatic lesions T2, T3, T6. Also with hx of metastatic pneumothorax on the Rt.    PAIN:  Are you having pain? No   PRECAUTIONS: active cancer left axillary LNs of unknown origin  WEIGHT BEARING RESTRICTIONS wearing boot all the time for Charcot foot  FALLS:  Has patient fallen in last 6 months? No  LIVING ENVIRONMENT: Lives with:  lives with their spouse  OCCUPATION: not working  LEISURE: nothing extra  HAND DOMINANCE : right   PRIOR LEVEL OF FUNCTION: Independent  PATIENT GOALS what do I do with the swelling?   OBJECTIVE  COGNITION:  Overall cognitive status: Within functional limits for tasks assessed   PALPATION: Pitting not assessed today. Tightness in Left supraclavicular and cervical region from past radiation  OBSERVATIONS / OTHER ASSESSMENTS: Lt UE appears larger overall - left breast appears larger   POSTURE: stiff neck movements  UPPER EXTREMITY AROM/PROM:  A/PROM RIGHT   eval   Shoulder extension   Shoulder flexion 143  Shoulder abduction 165  Shoulder internal rotation   Shoulder external rotation     (Blank rows = not tested)  A/PROM LEFT   eval  Shoulder extension   Shoulder flexion 140  Shoulder abduction 165  Shoulder internal rotation   Shoulder external rotation     (Blank rows = not tested)   LYMPHEDEMA ASSESSMENTS:   LANDMARK RIGHT  eval    31   10 cm proximal to olecranon process 28.7   Olecranon process 25.5    23.7   10 cm proximal to ulnar styloid process 20.7   Just proximal to ulnar styloid process 15.1   Across hand at thumb web space 17.5   At base of 2nd digit 6.2   (Blank rows = not tested) 1811m  LANDMARK LEFT  eval LEFT 02/18/2022 02/25/22 02/28/22 03/07/22  Axilla   34 33.6 33  15cm proximal to olecranon 33.5 34 32._0 cm proximal to olecranon process 31.5 30.5 30._1 Olecranon process 28.3 26._2 15cm proximal to ulnar styloid 26.4 25.7 23.7    10 cm proximal to ulnar styloid process 23 23.2 20.8  22  Just proximal to ulnar styloid process 16.3 16 15.5  15.6  Across hand at thumb web space 18.3 18.1 18.2  17.6  At base of 2nd digit 6.0 5.7 6.0  6.0  (Blank rows = not tested) 9length [id number] eval [id number] 02/25/22 Difference of 395m7men 92ml62m8/4/23  TODAY'S TREATMENT  03/07/22 Reviewed garment fit which still is  working well.  Gave pt handout on mondi if the elbow redness remains bothersome to have a flat knit vs circular option.  Also had pt try on harmony gaunlet as a softer hand option as she does not have much edema in the hand.  Pt may consider both of these.  No questions on MLD.   Manual Lymph Drainage: In Supine (avoided short neck due to radiation fibrosis), 5 diaphragmatic breaths, Lt inguinal and Rt axillary nodes, Lt axillo-inguinal and anterior inter-axillary anastomosis, then focused on Lt UE working from upper lateral arm, medial to lateral, then lateral again, next worked on ant aManufacturing systems engineerpost forearm , spending time on anterior aspect where increase fluid noted, briefly on dorsum of hand then retraced all steps ending with LN's.   03/04/22 Self care Pt arrives with new garments - donning education for these which seem to fit well but may be slightly short and slightly larger at the wrist but overall pt feel like she would not like anything longer.  Pt donned with gloves for practice and did well.  Pt is instructed to wear these as able over the  weekend and continuing bandaging at night.  We will assess for good fit on Monday or any returns needed.   Education on self MLD with performance of each step after PT demo and instruction.  Given handout.  Excellent performance pt pt.   03/02/22 Manual Therapy  Manual Lymph Drainage: In Supine (avoided short neck due to radiation fibrosis), 5 diaphragmatic breaths, Lt inguinal and Rt axillary nodes, Lt axillo-inguinal and anterior inter-axillary anastomosis, then focused on Lt UE working from upper lateral arm, medial to lateral, then lateral again, next worked on Manufacturing systems engineer and post forearm , spending time on anterior aspect where increase fluid noted, briefly on dorsum of hand then retraced all steps ending with LN's.  Compression Bandaging: Cocoa butter applied to Lt UE, next TG soft, then Elastomull to fingers 1-5, Artiflex with foam at cubital fossa, then 1-6 cm to  hand, and 1-10 and 12 cm short stretch bandages from wrist to axilla   02/28/22 Manual Therapy  Manual Lymph Drainage: In Supine (avoided short neck due to radiation fibrosis), 5 diaphragmatic breaths, Lt inguinal and Rt axillary nodes, Lt axillo-inguinal and anterior inter-axillary anastomosis, then focused on Lt UE working from upper lateral arm, medial to lateral, then lateral again, next worked on Manufacturing systems engineer and post forearm , spending time on anterior aspect where increase fluid noted, briefly on dorsum of hand then retraced all steps ending with LN's. Then in right sidelying posterior scapular work.  Compression Bandaging: Cocoa butter applied to Lt UE, next TG soft, then Elastomull to fingers 1-5, Artiflex with foam at cubital fossa, then 1-6 cm to hand, and 1-10 and 12 cm short stretch bandages from wrist to axilla reviewing correct technique with pt while applying.   PATIENT EDUCATION:  Education details: lymphedema and treatment options, POC Person educated: Patient Education method: Explanation Education comprehension: verbalized understanding, returned demonstration, verbal cues required, tactile cues required, and needs further education  HOME EXERCISE PROGRAM: Elevation, self bandaging, self MLD  ASSESSMENT:  CLINICAL IMPRESSION: Ready for DC  OBJECTIVE IMPAIRMENTS decreased knowledge of condition, decreased knowledge of use of DME, decreased ROM, decreased strength, and increased edema.   ACTIVITY LIMITATIONS carrying and lifting  PARTICIPATION LIMITATIONS: cleaning, laundry, community activity, and yard work  PERSONAL FACTORS Past/current experiences and 1-2 comorbidities: radiation hx, metastatic status with lymphadenopathy  are also affecting patient's functional outcome.   REHAB POTENTIAL: Good  CLINICAL DECISION MAKING: Evolving/moderate complexity  EVALUATION COMPLEXITY: Moderate  GOALS: Goals reviewed with patient? Yes   LONG TERM GOALS: Target date: 03/30/22  Pt  will obtain appropriate compression garments for management of UE and breast lymphedema Baseline:  Goal status: MET  2.  Pt will decrease Lt UE volume to 255ml or less to demonstrate decreased lymphatic load Baseline:  Goal status: MET  3.  Pt will be ind with HEP to continue cervical and UE mobility Baseline:  Goal status: DEFERRED  4.  Pt will be ind with self MLD or use of compression pump Baseline:  Goal status: MET  PLAN: PT FREQUENCY: 3x/week  PT DURATION: 8 weeks  PLANNED INTERVENTIONS: Therapeutic exercises, Patient/Family education, Self Care, DME instructions, Manual lymph drainage, Compression bandaging, Taping, Manual therapy, and Re-evaluation  PLAN FOR NEXT SESSION:  Cont compression bandaging (review this prn) and MLD to the Lt UE/breast/upper quadrant mindful of active cancer in Lt axilla and begin instructing pt having her return demo and issue handout of MLD next session   Stark Bray, PT 03/07/2022, 11:56 AM  PHYSICAL THERAPY  DISCHARGE SUMMARY  Visits from Start of Care: 9  Current functional level related to goals / functional outcomes: See above   Remaining deficits: Chronic lymphedema   Education / Equipment: MLD, compression  Plan: Patient agrees to discharge.  Patient is being discharged due to meeting the stated rehab goals.

## 2022-03-08 ENCOUNTER — Inpatient Hospital Stay: Payer: Medicare HMO

## 2022-03-08 ENCOUNTER — Other Ambulatory Visit: Payer: Self-pay

## 2022-03-08 ENCOUNTER — Inpatient Hospital Stay: Payer: Medicare HMO | Attending: Internal Medicine | Admitting: Internal Medicine

## 2022-03-08 VITALS — BP 126/88 | HR 78 | Temp 98.2°F | Resp 16 | Ht 63.0 in | Wt 152.8 lb

## 2022-03-08 DIAGNOSIS — R110 Nausea: Secondary | ICD-10-CM | POA: Diagnosis not present

## 2022-03-08 DIAGNOSIS — C3411 Malignant neoplasm of upper lobe, right bronchus or lung: Secondary | ICD-10-CM

## 2022-03-08 DIAGNOSIS — C3431 Malignant neoplasm of lower lobe, right bronchus or lung: Secondary | ICD-10-CM | POA: Diagnosis not present

## 2022-03-08 DIAGNOSIS — Z79899 Other long term (current) drug therapy: Secondary | ICD-10-CM | POA: Diagnosis not present

## 2022-03-08 DIAGNOSIS — C3490 Malignant neoplasm of unspecified part of unspecified bronchus or lung: Secondary | ICD-10-CM

## 2022-03-08 DIAGNOSIS — J910 Malignant pleural effusion: Secondary | ICD-10-CM | POA: Diagnosis not present

## 2022-03-08 LAB — CMP (CANCER CENTER ONLY)
ALT: 188 U/L — ABNORMAL HIGH (ref 0–44)
AST: 94 U/L — ABNORMAL HIGH (ref 15–41)
Albumin: 3.8 g/dL (ref 3.5–5.0)
Alkaline Phosphatase: 65 U/L (ref 38–126)
Anion gap: 4 — ABNORMAL LOW (ref 5–15)
BUN: 20 mg/dL (ref 8–23)
CO2: 28 mmol/L (ref 22–32)
Calcium: 8.8 mg/dL — ABNORMAL LOW (ref 8.9–10.3)
Chloride: 105 mmol/L (ref 98–111)
Creatinine: 0.81 mg/dL (ref 0.44–1.00)
GFR, Estimated: >60 mL/min (ref >60)
Glucose, Bld: 79 mg/dL (ref 70–99)
Potassium: 4 mmol/L (ref 3.5–5.1)
Sodium: 137 mmol/L (ref 135–145)
Total Bilirubin: 0.3 mg/dL (ref 0.3–1.2)
Total Protein: 6.2 g/dL — ABNORMAL LOW (ref 6.5–8.1)

## 2022-03-08 LAB — CBC WITH DIFFERENTIAL (CANCER CENTER ONLY)
Abs Immature Granulocytes: 0.01 10*3/uL (ref 0.00–0.07)
Basophils Absolute: 0.1 10*3/uL (ref 0.0–0.1)
Basophils Relative: 2 %
Eosinophils Absolute: 0.2 10*3/uL (ref 0.0–0.5)
Eosinophils Relative: 5 %
HCT: 37.8 % (ref 36.0–46.0)
Hemoglobin: 12.9 g/dL (ref 12.0–15.0)
Immature Granulocytes: 0 %
Lymphocytes Relative: 29 %
Lymphs Abs: 1.2 10*3/uL (ref 0.7–4.0)
MCH: 29.4 pg (ref 26.0–34.0)
MCHC: 34.1 g/dL (ref 30.0–36.0)
MCV: 86.1 fL (ref 80.0–100.0)
Monocytes Absolute: 0.7 10*3/uL (ref 0.1–1.0)
Monocytes Relative: 17 %
Neutro Abs: 2 10*3/uL (ref 1.7–7.7)
Neutrophils Relative %: 47 %
Platelet Count: 308 10*3/uL (ref 150–400)
RBC: 4.39 MIL/uL (ref 3.87–5.11)
RDW: 14.2 % (ref 11.5–15.5)
WBC Count: 4.2 10*3/uL (ref 4.0–10.5)
nRBC: 0 % (ref 0.0–0.2)

## 2022-03-08 LAB — TSH: TSH: 4.389 u[IU]/mL (ref 0.350–4.500)

## 2022-03-08 NOTE — Progress Notes (Signed)
[facility] [phone]) [phone]   [phone]  OFFICE PROGRESS NOTE  Lyman Bishop, DO [address] [place] [id number]  DIAGNOSIS:  1) Stage IIIB (T2a, N3, M0) non-small cell lung cancer, adenocarcinoma with negative EGFR mutation and negative ALK gene translocation diagnosed in October 2016. 2) right breast ductal carcinoma in situ diagnosed in December 2022.  Biomarker Findings Microsatellite status - MS-Stable Tumor Mutational Burden - TMB-Low (5 Muts/Mb) Genomic Findings For a complete list of the genes assayed, please refer to the Appendix. ROS1 SDC4-ROS1 fusion RBM10 N574f*14 TP53 R273C 7 Disease relevant genes with no reportable alterations: EGFR, KRAS, ALK, BRAF, MET, RET, ERBB2  PRIOR THERAPY:  1) Concurrent chemoradiation with weekly carboplatin for AUC of 2 and paclitaxel 45 MG/M2 status post 6 cycles. Last dose was given 07/13/2015 with partial response. 2) Systemic chemotherapy with carboplatin for AUC of 5 and Alimta 500 MG/M2 every 3 weeks. First dose 11/02/2015. Status post 6 cycles. 3) palliative stereotactic radiotherapy to the left supraclavicular lymph node under the care of Dr. MTammi Klippel  Loss of fraction of radiotherapy scheduled for November 01, 2017 4) palliative radiotherapy to the left supraclavicular lymph node as well as left upper lobe lung mass completed May 04, 2020 under the care of Dr. MTammi Klippel 5) Entrectinib 600 mg p.o. daily.  Started April 23, 2021.  Her dose was reduced to 400 mg p.o. daily.  Status post 7 months of treatment.  Her treatment has been on hold since October 25, 2021 secondary to intolerance  CURRENT THERAPY: Xalkori 250 mg p.o. twice daily.  First dose started February 14, 2022.  INTERVAL HISTORY: [name]66y.o. female returns to the clinic today for follow-up visit.  The patient is feeling fine today with no concerning complaints except for mild fatigue.  She also has visual  flashes intermittently sometimes in the morning and sometimes in the afternoon.  She has few episodes of nausea but this is improved after she took her antiemetics.  She is tolerating her current treatment with XHulda Humphreymuch better.  She denied having any current nausea, vomiting, diarrhea or constipation.  She denied having any headache.  She has no weight loss or night sweats.  She has no chest pain but has shortness of breath with exertion with mild cough and no hemoptysis.  She is here today for evaluation and repeat blood work.   MEDICAL HISTORY: Past Medical History:  Diagnosis Date   Age related osteoporosis 12/18/2020   Anxiety    Atherosclerosis 12/18/2020   Back pain    right back lateral   BCC (basal cell carcinoma of skin)    Bone metastasis 10/04/2017   [place]19[place]   Chronic pain 12/18/2020   Colon polyps    Complication of anesthesia    pt. states that she is difficult to arouse   Coronary artery calcification of native artery    Coronary atherosclerosis 04/24/2020   Diverticulosis    Dysrhythmia    "skip beat in early 20's"   Encounter for antineoplastic chemotherapy 06/08/2015   Generalized rash 06/24/2015   H/O cold sores    History of anemia    Lung cancer (HCC)    RUL mass -being tx- radiation and chemo- remains last chemo 01-04-16, 2023 left lung Ca   Lung field abnormal 12/18/2020   Metastasis to supraclavicular lymph node ([facility] 10/04/2017   Metastatic non-small cell lung  cancer ([place]) 03/24/2020   Mixed dyslipidemia 04/24/2020   Oral lesion 06/24/2015   Osteoporosis    Personal history of colonic polyps 12/18/2020   PONV (postoperative nausea and vomiting)    Port catheter in place 11/20/2015   Rectal bleeding    right upper lobe lung mass 04/15/2015   malignant tumor found - 10'16-radiation and chemotherapy and remains with chemo at present- Dr. Earlie Server follows.   Sciatica 12/18/2020   Tricuspid regurgitation     Vitamin D deficiency 12/18/2020    ALLERGIES:  is allergic to latex and sod picosulfate-mag ox-cit acd.  MEDICATIONS:  Current Outpatient Medications  Medication Sig Dispense Refill   Acetylcysteine (NAC 600) 600 MG CAPS Take 600 mg by mouth daily.     albuterol (VENTOLIN HFA) 108 (90 Base) MCG/ACT inhaler Inhale 2 puffs into the lungs every 6 (six) hours as needed for wheezing or shortness of breath.     atorvastatin (LIPITOR) 10 MG tablet TAKE 1 TABLET BY MOUTH EVERY DAY 30 tablet 1   atorvastatin (LIPITOR) 10 MG tablet Take 5 mg by mouth daily.     benzonatate (TESSALON) 200 MG capsule Take 1 capsule (200 mg total) by mouth 3 (three) times daily as needed for cough. 20 capsule 0   Biotin 10000 MCG TABS Take 10,000 mcg by mouth daily.     Calcium Carb-Cholecalciferol (CALCIUM 1000 + D PO) Take 1 tablet by mouth 2 (two) times daily. 1000 mg /1200 mg     cetirizine (ZYRTEC) 10 MG tablet Take 10 mg by mouth daily.     crizotinib (XALKORI) 250 MG capsule Take 1 capsule (250 mg total) by mouth 2 (two) times daily. 60 capsule 2   denosumab (PROLIA) 60 MG/ML SOSY injection Inject 60 mg into the skin every 6 (six) months.     L-Lysine 1000 MG TABS Take 1,000 mg by mouth daily.      lidocaine (LIDODERM) 5 % Place 1 patch onto the skin daily. Remove & Discard patch within 12 hours or as directed by MD 15 patch 0   Melatonin 3 MG CAPS Take 3 mg by mouth at bedtime.     Multiple Vitamins-Minerals (PRESERVISION AREDS) CAPS Take 1 capsule by mouth daily.     nitroGLYCERIN (NITROSTAT) 0.4 MG SL tablet Place 0.4 mg under the tongue every 5 (five) minutes as needed for chest pain.     Omega-3 Fatty Acids (RA FISH OIL PO) Take 1,200 mg by mouth daily.     PREBIOTIC PRODUCT PO Take 1 tablet by mouth daily. culterelle     prochlorperazine (COMPAZINE) 10 MG tablet Take 1 tablet (10 mg total) by mouth every 6 (six) hours as needed for nausea or vomiting. 30 tablet 0   Psyllium (REGULOID PO) Take 750 mg by  mouth daily as needed (Constipation).     Turmeric Curcumin 500 MG CAPS Take 500 mg by mouth daily.     vitamin B-12 (CYANOCOBALAMIN) 1000 MCG tablet Take 1,000 mcg by mouth daily.     No current facility-administered medications for this visit.    SURGICAL HISTORY:  Past Surgical History:  Procedure Laterality Date   BREAST EXCISIONAL BIOPSY Right    BREAST EXCISIONAL BIOPSY Right    BREAST EXCISIONAL BIOPSY Right    BREAST LUMPECTOMY WITH RADIOACTIVE SEED LOCALIZATION Right 07/28/2021   Procedure: RIGHT BREAST LUMPECTOMY WITH RADIOACTIVE SEED LOCALIZATION X2;  Surgeon: Stark Klein, MD;  Location: [facility];  Service: General;  Laterality: Right;  60 MINUTES ROOM 2  COLONOSCOPY     COLONOSCOPY W/ POLYPECTOMY     COLONOSCOPY WITH PROPOFOL N/A 01/20/2016   Procedure: COLONOSCOPY WITH PROPOFOL;  Surgeon: Arta Silence, MD;  Location: [facility];  Service: Endoscopy;  Laterality: N/A;   CYSTECTOMY     right breast x 2   IR REMOVAL TUN ACCESS W/ PORT W/O FL MOD SED  12/12/2018   IR THORACENTESIS ASP PLEURAL SPACE W/IMG GUIDE  01/27/2022   MEDIASTINOSCOPY N/A 05/07/2015   Procedure: MEDIASTINOSCOPY;  Surgeon: Ivin Poot, MD;  Location: [facility];  Service: Thoracic;  Laterality: N/A;   TONSILLECTOMY     VIDEO BRONCHOSCOPY WITH ENDOBRONCHIAL ULTRASOUND N/A 05/07/2015   Procedure: VIDEO BRONCHOSCOPY WITH ENDOBRONCHIAL ULTRASOUND;  Surgeon: Ivin Poot, MD;  Location: [facility];  Service: Thoracic;  Laterality: N/A;    REVIEW OF SYSTEMS:  A comprehensive review of systems was negative except for: Constitutional: positive for fatigue Eyes: positive for visual disturbance Respiratory: positive for dyspnea on exertion   PHYSICAL EXAMINATION: General appearance: alert, cooperative, fatigued and no distress Head: Normocephalic, without obvious abnormality, atraumatic Neck: no adenopathy, no JVD, supple, symmetrical, trachea midline and thyroid not enlarged, symmetric, no  tenderness/mass/nodules Lymph nodes: Cervical, supraclavicular, and axillary nodes normal. Resp: clear to auscultation bilaterally Back: symmetric, no curvature. ROM normal. No CVA tenderness. Cardio: regular rate and rhythm, S1, S2 normal, no murmur, click, rub or gallop GI: soft, non-tender; bowel sounds normal; no masses,  no organomegaly Extremities: extremities normal, atraumatic, no cyanosis or edema Neurological exam unremarkable  ECOG PERFORMANCE STATUS: 1 - Symptomatic but completely ambulatory  Blood pressure 126/88, pulse 78, temperature 98.2 F (36.8 C), temperature source Oral, resp. rate 16, height 5' 3"  (1.6 m), weight 152 lb 12.8 oz (69.3 kg), SpO2 96 %.  LABORATORY DATA: Lab Results  Component Value Date   WBC 4.2 03/08/2022   HGB 12.9 03/08/2022   HCT 37.8 03/08/2022   MCV 86.1 03/08/2022   PLT 308 03/08/2022      Chemistry      Component Value Date/Time   NA 139 02/09/2022 0818   NA 140 01/26/2022 1153   NA 139 03/20/2017 1038   K 4.0 02/09/2022 0818   K 4.3 03/20/2017 1038   CL 105 02/09/2022 0818   CO2 29 02/09/2022 0818   CO2 27 03/20/2017 1038   BUN 12 02/09/2022 0818   BUN 14 01/26/2022 1153   BUN 15.4 03/20/2017 1038   CREATININE 0.65 02/09/2022 0818   CREATININE 0.7 03/20/2017 1038      Component Value Date/Time   CALCIUM 9.3 02/09/2022 0818   CALCIUM 9.8 03/20/2017 1038   ALKPHOS 48 02/09/2022 0818   ALKPHOS 37 (L) 03/20/2017 1038   AST 19 02/09/2022 0818   AST 25 03/20/2017 1038   ALT 16 02/09/2022 0818   ALT 22 03/20/2017 1038   BILITOT 0.4 02/09/2022 0818   BILITOT 0.42 03/20/2017 1038       RADIOGRAPHIC STUDIES: DG Chest 2 View  Result Date: 02/15/2022 CLINICAL DATA:  Right pleural effusion. EXAM: CHEST - 2 VIEW COMPARISON:  Chest radiograph January 27, 2022 FINDINGS: Stable cardiac and mediastinal contours. Interval increase in size of moderate right pleural effusion. Healed bilateral rib fractures right upper lung and left lower  lung. Redemonstrated wedge compression deformity upper thoracic spine vertebral body. IMPRESSION: Interval increase in size of moderate right pleural effusion. Redemonstrated upper thoracic spine wedge compression deformity. Electronically Signed   By: Lovey Newcomer M.D.   On: 02/15/2022 11:09  ECHOCARDIOGRAM COMPLETE  Result Date: 02/10/2022    ECHOCARDIOGRAM REPORT   Patient Name:   Rose Figueroa Date of Exam: 02/10/2022 Medical Rec #:  [id number]        Height:       63.0 in Accession #:    [id number]       Weight:       151.6 lb Date of Birth:  12/13/1955        BSA:          1.719 m Patient Age:    66 years         BP:           143/99 mmHg Patient Gender: F                HR:           97 bpm. Exam Location:  [address] Procedure: 2D Echo, Cardiac Doppler and Color Doppler Indications:    R06.00 Dyspnea  History:        Patient has prior history of Echocardiogram examinations, most                 recent 05/19/2020. Breast/Lung cancer, Arrythmias:Tachycardia,                 Signs/Symptoms:Murmur; Risk Factors:Dyslipidemia.  Sonographer:    Marygrace Drought RCS Referring Phys: 31 Cocke  1. Left ventricular ejection fraction, by estimation, is 50 to 55%. Left ventricular ejection fraction by PLAX is 51 %. The left ventricle has low normal function. The left ventricle has no regional wall motion abnormalities. There is mild left ventricular hypertrophy. Left ventricular diastolic parameters are consistent with Grade I diastolic dysfunction (impaired relaxation).  2. Right ventricular systolic function is normal. The right ventricular size is normal. There is normal pulmonary artery systolic pressure. The estimated right ventricular systolic pressure is 82.9 mmHg.  3. A small pericardial effusion is present. The pericardial effusion is circumferential. There is no evidence of cardiac tamponade. Moderate pleural effusion in the right lateral region.  4. The mitral valve is abnormal.  Trivial mitral valve regurgitation.  5. The aortic valve was not well visualized. Aortic valve regurgitation is not visualized.  6. The inferior vena cava is normal in size with greater than 50% respiratory variability, suggesting right atrial pressure of 3 mmHg. Comparison(s): Changes from prior study are noted. 05/19/2020: LVEF 60-65%, mild to moderate TR, no pericardial effusion. FINDINGS  Left Ventricle: Left ventricular ejection fraction, by estimation, is 50 to 55%. Left ventricular ejection fraction by PLAX is 51 %. The left ventricle has low normal function. The left ventricle has no regional wall motion abnormalities. The left ventricular internal cavity size was normal in size. There is mild left ventricular hypertrophy. Left ventricular diastolic parameters are consistent with Grade I diastolic dysfunction (impaired relaxation). Indeterminate filling pressures. Right Ventricle: The right ventricular size is normal. No increase in right ventricular wall thickness. Right ventricular systolic function is normal. There is normal pulmonary artery systolic pressure. The tricuspid regurgitant velocity is 2.01 m/s, and  with an assumed right atrial pressure of 3 mmHg, the estimated right ventricular systolic pressure is 56.2 mmHg. Left Atrium: Left atrial size was normal in size. Right Atrium: Right atrial size was normal in size. Pericardium: A small pericardial effusion is present. The pericardial effusion is circumferential. There is no evidence of cardiac tamponade. Mitral Valve: The mitral valve is abnormal. Mild mitral annular calcification. Trivial mitral valve regurgitation. Tricuspid  Valve: The tricuspid valve is grossly normal. Tricuspid valve regurgitation is trivial. Aortic Valve: The aortic valve was not well visualized. Aortic valve regurgitation is not visualized. Pulmonic Valve: The pulmonic valve was normal in structure. Pulmonic valve regurgitation is not visualized. Aorta: The aortic root and  ascending aorta are structurally normal, with no evidence of dilitation. Venous: The inferior vena cava is normal in size with greater than 50% respiratory variability, suggesting right atrial pressure of 3 mmHg. IAS/Shunts: No atrial level shunt detected by color flow Doppler. Additional Comments: There is a moderate pleural effusion in the right lateral region.  LEFT VENTRICLE PLAX 2D LV EF:         Left            Diastology                ventricular     LV e' medial:    4.46 cm/s                ejection        LV E/e' medial:  12.3                fraction by     LV e' lateral:   6.09 cm/s                PLAX is 51      LV E/e' lateral: 9.0                %. LVIDd:         2.80 cm LVIDs:         2.10 cm LV PW:         1.10 cm LV IVS:        1.10 cm LVOT diam:     1.80 cm LV SV:         48 LV SV Index:   28 LVOT Area:     2.54 cm  RIGHT VENTRICLE RV Basal diam:  2.50 cm RV S prime:     11.40 cm/s TAPSE (M-mode): 1.3 cm RVSP:           19.2 mmHg LEFT ATRIUM             Index        RIGHT ATRIUM           Index LA diam:        2.20 cm 1.28 cm/m   RA Pressure: 3.00 mmHg LA Vol (A2C):   18.3 ml 10.65 ml/m  RA Area:     7.73 cm LA Vol (A4C):   20.1 ml 11.70 ml/m  RA Volume:   14.80 ml  8.61 ml/m LA Biplane Vol: 20.6 ml 11.99 ml/m  AORTIC VALVE LVOT Vmax:   103.00 cm/s LVOT Vmean:  76.000 cm/s LVOT VTI:    0.189 m  AORTA Ao Root diam: 3.10 cm Ao Asc diam:  3.30 cm MITRAL VALVE               TRICUSPID VALVE MV Area (PHT):             TR Peak grad:   16.2 mmHg MV Decel Time:             TR Vmax:        201.00 cm/s MV E velocity: 55.00 cm/s  Estimated RAP:  3.00 mmHg MV A velocity: 67.10 cm/s  RVSP:           19.2 mmHg MV  E/A ratio:  0.82                            SHUNTS                            Systemic VTI:  0.19 m                            Systemic Diam: 1.80 cm Lyman Bishop MD Electronically signed by Lyman Bishop MD Signature Date/Time: 02/10/2022/12:43:43 PM    Final     ASSESSMENT AND PLAN:  This is  a very pleasant 66 years old white female with metastatic non-small cell lung cancer initially diagnosed as stage IIIB non-small cell lung cancer, adenocarcinoma with negative EGFR, ALK mutations diagnosed in October 2016.  Molecular studies showed positive ROS 1 She is status post course of concurrent chemoradiation followed by consolidation chemotherapy with carboplatin and Alimta. The patient has been in observation for close to 3 years. She underwent palliative radiotherapy to the left supraclavicular lymph nodes with improvement of her disease. She also underwent SBRT to left upper lobe lung nodule as well as left supraclavicular lymphadenopathy under the care of Dr. Tammi Klippel completed in October 2021. She was found on recent CT scan of the chest to have evidence for disease recurrence in addition to malignant right pleural effusion. The patient started treatment with Entrectinib 600 mg p.o. daily and she has a lot of trouble with the treatment including fatigue, paresthesia, feeling over the most of the time and imbalance of her gait.  Her dose of Entrectinib was reduced to 400 mg p.o. daily several weeks ago and she has been tolerating the lower dose much better.  Her dose was even reduced to Entrectinib 200 mg p.o. daily but she continues to have issues with this medication with increasing fatigue and weakness as well as rash. She discontinued her treatment completely on October 25, 2021 because of the poor quality of life. The patient has been on observation since that time but recent imaging studies with CT angiogram of the chest showed progressive disease with increase in right pleural effusion and the cytology was consistent with progressive adenocarcinoma of the lung.  She also continues to have increase in the size of the left axillary lymphadenopathy as well as the increase in the asymmetric left breast dermal thickening compared to the right and this may be also consistent with progressive breast  cancer. The patient started treatment with Xalkori 250 mg p.o. twice daily on February 11, 2022 and has been tolerating it much better than Entrectinib.  She has some visual flashes and mild nausea but in general are very tolerable. I recommended for her to continue her current treatment with Xalkori with the same dose. I will see her back for follow-up visit in 5 weeks for evaluation with repeat CT scan of the chest, abdomen and pelvis for restaging of her disease. She will need to continue close follow-up and monitoring by Dr. Barry Dienes and Dr. Lindi Adie for her breast cancer. For the recurrent light pleural effusion, we will continue to monitor closely and consider The patient for drainage if needed. The patient was advised to call immediately if she has any concerning symptoms in the interval.  The patient voices understanding of current disease status and treatment options and is in agreement with the current care plan. All  questions were answered. The patient knows to call the clinic with any problems, questions or concerns. We can certainly see the patient much sooner if necessary. The total time spent in the appointment was 40 minutes.  Disclaimer: This note was dictated with voice recognition software. Similar sounding words can inadvertently be transcribed and may not be corrected upon review.

## 2022-03-09 ENCOUNTER — Ambulatory Visit: Payer: Medicare HMO

## 2022-03-11 ENCOUNTER — Encounter: Payer: Medicare HMO | Admitting: Rehabilitation

## 2022-03-14 ENCOUNTER — Encounter: Payer: Medicare HMO | Admitting: Rehabilitation

## 2022-03-18 ENCOUNTER — Encounter: Payer: Medicare HMO | Admitting: Rehabilitation

## 2022-03-21 ENCOUNTER — Other Ambulatory Visit: Payer: Self-pay | Admitting: Internal Medicine

## 2022-03-21 ENCOUNTER — Encounter: Payer: Medicare HMO | Admitting: Rehabilitation

## 2022-03-21 DIAGNOSIS — C3411 Malignant neoplasm of upper lobe, right bronchus or lung: Secondary | ICD-10-CM

## 2022-03-22 ENCOUNTER — Telehealth: Payer: Self-pay | Admitting: Internal Medicine

## 2022-03-22 NOTE — Telephone Encounter (Signed)
Scheduled per 08/16 los, patient has been called and notified.  

## 2022-03-23 ENCOUNTER — Encounter: Payer: Medicare HMO | Admitting: Rehabilitation

## 2022-03-23 DIAGNOSIS — C3492 Malignant neoplasm of unspecified part of left bronchus or lung: Secondary | ICD-10-CM | POA: Diagnosis not present

## 2022-03-23 DIAGNOSIS — D0511 Intraductal carcinoma in situ of right breast: Secondary | ICD-10-CM | POA: Diagnosis not present

## 2022-03-23 DIAGNOSIS — R5383 Other fatigue: Secondary | ICD-10-CM | POA: Diagnosis not present

## 2022-03-23 DIAGNOSIS — R000 Tachycardia, unspecified: Secondary | ICD-10-CM | POA: Diagnosis not present

## 2022-03-25 ENCOUNTER — Encounter: Payer: Medicare HMO | Admitting: Rehabilitation

## 2022-04-04 ENCOUNTER — Inpatient Hospital Stay: Payer: Medicare HMO | Attending: Internal Medicine

## 2022-04-04 ENCOUNTER — Other Ambulatory Visit: Payer: Self-pay | Admitting: Medical Oncology

## 2022-04-04 ENCOUNTER — Telehealth: Payer: Self-pay | Admitting: Medical Oncology

## 2022-04-04 ENCOUNTER — Ambulatory Visit (HOSPITAL_COMMUNITY)
Admission: RE | Admit: 2022-04-04 | Discharge: 2022-04-04 | Disposition: A | Discharge status: Home / Self Care | Payer: Medicare HMO | Source: Ambulatory Visit | Attending: Internal Medicine | Admitting: Internal Medicine

## 2022-04-04 ENCOUNTER — Other Ambulatory Visit: Payer: Medicare HMO

## 2022-04-04 ENCOUNTER — Other Ambulatory Visit: Payer: Self-pay

## 2022-04-04 DIAGNOSIS — Z85118 Personal history of other malignant neoplasm of bronchus and lung: Secondary | ICD-10-CM | POA: Insufficient documentation

## 2022-04-04 DIAGNOSIS — J910 Malignant pleural effusion: Secondary | ICD-10-CM | POA: Insufficient documentation

## 2022-04-04 DIAGNOSIS — C3490 Malignant neoplasm of unspecified part of unspecified bronchus or lung: Secondary | ICD-10-CM | POA: Insufficient documentation

## 2022-04-04 DIAGNOSIS — C7951 Secondary malignant neoplasm of bone: Secondary | ICD-10-CM | POA: Insufficient documentation

## 2022-04-04 DIAGNOSIS — K6389 Other specified diseases of intestine: Secondary | ICD-10-CM | POA: Diagnosis not present

## 2022-04-04 DIAGNOSIS — R112 Nausea with vomiting, unspecified: Secondary | ICD-10-CM | POA: Insufficient documentation

## 2022-04-04 DIAGNOSIS — D0511 Intraductal carcinoma in situ of right breast: Secondary | ICD-10-CM | POA: Diagnosis not present

## 2022-04-04 DIAGNOSIS — C50911 Malignant neoplasm of unspecified site of right female breast: Secondary | ICD-10-CM | POA: Insufficient documentation

## 2022-04-04 DIAGNOSIS — J90 Pleural effusion, not elsewhere classified: Secondary | ICD-10-CM | POA: Diagnosis not present

## 2022-04-04 LAB — CBC WITH DIFFERENTIAL (CANCER CENTER ONLY)
Abs Immature Granulocytes: 0.01 10*3/uL (ref 0.00–0.07)
Basophils Absolute: 0 10*3/uL (ref 0.0–0.1)
Basophils Relative: 1 %
Eosinophils Absolute: 0.2 10*3/uL (ref 0.0–0.5)
Eosinophils Relative: 4 %
HCT: 38.3 % (ref 36.0–46.0)
Hemoglobin: 12.7 g/dL (ref 12.0–15.0)
Immature Granulocytes: 0 %
Lymphocytes Relative: 26 %
Lymphs Abs: 1.1 10*3/uL (ref 0.7–4.0)
MCH: 29.7 pg (ref 26.0–34.0)
MCHC: 33.2 g/dL (ref 30.0–36.0)
MCV: 89.5 fL (ref 80.0–100.0)
Monocytes Absolute: 0.7 10*3/uL (ref 0.1–1.0)
Monocytes Relative: 16 %
Neutro Abs: 2.2 10*3/uL (ref 1.7–7.7)
Neutrophils Relative %: 53 %
Platelet Count: 335 10*3/uL (ref 150–400)
RBC: 4.28 MIL/uL (ref 3.87–5.11)
RDW: 16.4 % — ABNORMAL HIGH (ref 11.5–15.5)
WBC Count: 4.1 10*3/uL (ref 4.0–10.5)
nRBC: 0 % (ref 0.0–0.2)

## 2022-04-04 LAB — CMP (CANCER CENTER ONLY)
ALT: 85 U/L — ABNORMAL HIGH (ref 0–44)
AST: 55 U/L — ABNORMAL HIGH (ref 15–41)
Albumin: 3.6 g/dL (ref 3.5–5.0)
Alkaline Phosphatase: 68 U/L (ref 38–126)
Anion gap: 2 — ABNORMAL LOW (ref 5–15)
BUN: 14 mg/dL (ref 8–23)
CO2: 31 mmol/L (ref 22–32)
Calcium: 9 mg/dL (ref 8.9–10.3)
Chloride: 104 mmol/L (ref 98–111)
Creatinine: 0.82 mg/dL (ref 0.44–1.00)
GFR, Estimated: >60 mL/min (ref >60)
Glucose, Bld: 101 mg/dL — ABNORMAL HIGH (ref 70–99)
Potassium: 4.5 mmol/L (ref 3.5–5.1)
Sodium: 137 mmol/L (ref 135–145)
Total Bilirubin: 0.3 mg/dL (ref 0.3–1.2)
Total Protein: 6.1 g/dL — ABNORMAL LOW (ref 6.5–8.1)

## 2022-04-04 MED ORDER — IOHEXOL 9 MG/ML PO SOLN
1000.0000 mL | Freq: Once | ORAL | Status: AC
  Administered 2022-04-04: 1000 mL via ORAL

## 2022-04-04 MED ORDER — IOHEXOL 300 MG/ML  SOLN
100.0000 mL | Freq: Once | INTRAMUSCULAR | Status: AC | PRN
  Administered 2022-04-04: 100 mL via INTRAVENOUS

## 2022-04-04 MED ORDER — SODIUM CHLORIDE (PF) 0.9 % IJ SOLN
INTRAMUSCULAR | Status: AC
  Filled 2022-04-04: qty 50

## 2022-04-04 NOTE — Telephone Encounter (Signed)
  SOB. She is getting "more winded" going up stairs or an incliine. She thinks she needs a thoracentesis . CT scan done today .  Does Dr. Julien Nordmann want to review CT scan first before getting thoracentesis?  Next appt 09/18.

## 2022-04-06 ENCOUNTER — Ambulatory Visit: Payer: Medicare HMO | Admitting: Internal Medicine

## 2022-04-07 ENCOUNTER — Ambulatory Visit (HOSPITAL_COMMUNITY)
Admission: RE | Admit: 2022-04-07 | Discharge: 2022-04-07 | Disposition: A | Discharge status: Home / Self Care | Payer: Medicare HMO | Source: Ambulatory Visit | Attending: Internal Medicine | Admitting: Internal Medicine

## 2022-04-07 VITALS — BP 122/85

## 2022-04-07 DIAGNOSIS — J90 Pleural effusion, not elsewhere classified: Secondary | ICD-10-CM | POA: Insufficient documentation

## 2022-04-07 DIAGNOSIS — C50919 Malignant neoplasm of unspecified site of unspecified female breast: Secondary | ICD-10-CM | POA: Diagnosis not present

## 2022-04-07 DIAGNOSIS — C50912 Malignant neoplasm of unspecified site of left female breast: Secondary | ICD-10-CM

## 2022-04-07 DIAGNOSIS — R0602 Shortness of breath: Secondary | ICD-10-CM | POA: Diagnosis not present

## 2022-04-07 DIAGNOSIS — C3490 Malignant neoplasm of unspecified part of unspecified bronchus or lung: Secondary | ICD-10-CM | POA: Diagnosis not present

## 2022-04-07 MED ORDER — LIDOCAINE HCL 1 % IJ SOLN
INTRAMUSCULAR | Status: AC
  Administered 2022-04-07: 15 mL
  Filled 2022-04-07: qty 20

## 2022-04-07 NOTE — Procedures (Signed)
PROCEDURE SUMMARY:  Successful US guided therapeutic right thoracentesis. Yielded 700 cc of clear, yellow fluid. Pt tolerated procedure well. No immediate complications.  Specimen not sent for labs. CXR ordered.  EBL < 1 mL  Tyson Alias, AGNP 04/07/2022 10:46 AM

## 2022-04-09 NOTE — Progress Notes (Unsigned)
[facility] OFFICE PROGRESS NOTE  Rose Bishop, [address] [place] [id number]  DIAGNOSIS: 1) Stage IIIB (T2a, N3, M0) non-small cell lung cancer, adenocarcinoma with negative EGFR mutation and negative ALK gene translocation diagnosed in October 2016. 2) right breast ductal carcinoma in situ diagnosed in December 2022.  Biomarker Findings Microsatellite status - MS-Stable Tumor Mutational Burden - TMB-Low (5 Muts/Mb) Genomic Findings For a complete list of the genes assayed, please refer to the Appendix. ROS1 SDC4-ROS1 fusion RBM10 N550f*14 TP53 R273C 7 Disease relevant genes with no reportable alterations: EGFR, KRAS, ALK, BRAF, MET, RET, ERBB2  PRIOR THERAPY: 1) Concurrent chemoradiation with weekly carboplatin for AUC of 2 and paclitaxel 45 MG/M2 status post 6 cycles. Last dose was given 07/13/2015 with partial response. 2) Systemic chemotherapy with carboplatin for AUC of 5 and Alimta 500 MG/M2 every 3 weeks. First dose 11/02/2015. Status post 6 cycles. 3) palliative stereotactic radiotherapy to the left supraclavicular lymph node under the care of Dr. MTammi Klippel  Loss of fraction of radiotherapy scheduled for November 01, 2017 4) palliative radiotherapy to the left supraclavicular lymph node as well as left upper lobe lung mass completed May 04, 2020 under the care of Dr. MTammi Klippel 5) Entrectinib 600 mg p.o. daily.  Started April 23, 2021.  Her dose was reduced to 400 mg p.o. daily.  Status post 7 months of treatment.  Her treatment has been on hold since October 25, 2021 secondary to intolerance    CURRENT THERAPY: Xalkori 250 mg p.o. twice daily.  First dose started February 14, 2022.  INTERVAL HISTORY: Rose BAYLE66y.o. female returns to clinic today for follow-up visit.  The patient was last seen in clinic by Dr. MJulien Nordmannon 8/15/thousand 22.  The patient is currently undergoing targeted treatment with Xalkori, and she is tolerating this  much better compared to her prior treatment with entrectinib.  She still continues to have some mild fatigue and visual flashes but this is improved compared to entrectinib.  She denies any fever, chills, night sweats, or unexplained weight loss.  She has baseline dyspnea on exertion but denies any chest pain, hemoptysis, or cough.  Denies any headaches.  She has some mild nausea in the morning without any vomiting.  Denies any diarrhea or constipation.  Denies any rashes or skin changes.  She also follows closely with Dr. GLindi Adiefor her history of breast cancer.  She recently had a restaging CT scan performed.  She is here today for evaluation, repeat blood work, and to review her scan results.         MEDICAL HISTORY: Past Medical History:  Diagnosis Date   Age related osteoporosis 12/18/2020   Anxiety    Atherosclerosis 12/18/2020   Back pain    right back lateral   BCC (basal cell carcinoma of skin)    Bone metastasis 10/04/2017   [place]   Chronic pain 12/18/2020   Colon polyps    Complication of anesthesia    pt. states that she is difficult to arouse   Coronary artery calcification of native artery    Coronary atherosclerosis 04/24/2020   Diverticulosis    Dysrhythmia    "skip beat in early 20's"   Encounter for antineoplastic chemotherapy 06/08/2015   Generalized rash 06/24/2015   H/O cold sores    History of anemia    Lung cancer (HCC)    RUL mass -being tx-  radiation and chemo- remains last chemo 01-04-16, 2023 left lung Ca   Lung field abnormal 12/18/2020   Metastasis to supraclavicular lymph node ([place]) 10/04/2017   Metastatic non-small cell lung cancer ([place]) 03/24/2020   Mixed dyslipidemia 04/24/2020   Oral lesion 06/24/2015   Osteoporosis    Personal history of colonic polyps 12/18/2020   PONV (postoperative nausea and vomiting)    Port catheter in place 11/20/2015   Rectal bleeding    right upper lobe lung mass  04/15/2015   malignant tumor found - 10'16-radiation and chemotherapy and remains with chemo at present- Dr. Earlie Server follows.   Sciatica 12/18/2020   Tricuspid regurgitation    Vitamin D deficiency 12/18/2020    ALLERGIES:  is allergic to latex and sod picosulfate-mag ox-cit acd.  MEDICATIONS:  Current Outpatient Medications  Medication Sig Dispense Refill   Acetylcysteine (NAC 600) 600 MG CAPS Take 600 mg by mouth daily.     albuterol (VENTOLIN HFA) 108 (90 Base) MCG/ACT inhaler Inhale 2 puffs into the lungs every 6 (six) hours as needed for wheezing or shortness of breath.     atorvastatin (LIPITOR) 10 MG tablet TAKE 1 TABLET BY MOUTH EVERY DAY 30 tablet 1   atorvastatin (LIPITOR) 10 MG tablet Take 5 mg by mouth daily.     benzonatate (TESSALON) 200 MG capsule Take 1 capsule (200 mg total) by mouth 3 (three) times daily as needed for cough. 20 capsule 0   Biotin 10000 MCG TABS Take 10,000 mcg by mouth daily.     Calcium Carb-Cholecalciferol (CALCIUM 1000 + D PO) Take 1 tablet by mouth 2 (two) times daily. 1000 mg /1200 mg     cetirizine (ZYRTEC) 10 MG tablet Take 10 mg by mouth daily.     crizotinib (XALKORI) 250 MG capsule Take 1 capsule (250 mg total) by mouth 2 (two) times daily. 60 capsule 2   denosumab (PROLIA) 60 MG/ML SOSY injection Inject 60 mg into the skin every 6 (six) months.     L-Lysine 1000 MG TABS Take 1,000 mg by mouth daily.      lidocaine (LIDODERM) 5 % Place 1 patch onto the skin daily. Remove & Discard patch within 12 hours or as directed by MD 15 patch 0   Melatonin 3 MG CAPS Take 3 mg by mouth at bedtime.     Multiple Vitamins-Minerals (PRESERVISION AREDS) CAPS Take 1 capsule by mouth daily.     nitroGLYCERIN (NITROSTAT) 0.4 MG SL tablet Place 0.4 mg under the tongue every 5 (five) minutes as needed for chest pain.     Omega-3 Fatty Acids (RA FISH OIL PO) Take 1,200 mg by mouth daily.     PREBIOTIC PRODUCT PO Take 1 tablet by mouth daily. culterelle      prochlorperazine (COMPAZINE) 10 MG tablet TAKE 1 TABLET BY MOUTH EVERY 6 HOURS AS NEEDED FOR NAUSEA OR VOMITING. 30 tablet 0   Psyllium (REGULOID PO) Take 750 mg by mouth daily as needed (Constipation).     Turmeric Curcumin 500 MG CAPS Take 500 mg by mouth daily.     vitamin B-12 (CYANOCOBALAMIN) 1000 MCG tablet Take 1,000 mcg by mouth daily.     No current facility-administered medications for this visit.    SURGICAL HISTORY:  Past Surgical History:  Procedure Laterality Date   BREAST EXCISIONAL BIOPSY Right    BREAST EXCISIONAL BIOPSY Right    BREAST EXCISIONAL BIOPSY Right    BREAST LUMPECTOMY WITH RADIOACTIVE SEED LOCALIZATION Right 07/28/2021   Procedure:  RIGHT BREAST LUMPECTOMY WITH RADIOACTIVE SEED LOCALIZATION X2;  Surgeon: Stark Klein, MD;  Location: [facility];  Service: General;  Laterality: Right;  60 MINUTES ROOM 2   COLONOSCOPY     COLONOSCOPY W/ POLYPECTOMY     COLONOSCOPY WITH PROPOFOL N/A 01/20/2016   Procedure: COLONOSCOPY WITH PROPOFOL;  Surgeon: Arta Silence, MD;  Location: [facility];  Service: Endoscopy;  Laterality: N/A;   CYSTECTOMY     right breast x 2   IR REMOVAL TUN ACCESS W/ PORT W/O FL MOD SED  12/12/2018   IR THORACENTESIS ASP PLEURAL SPACE W/IMG GUIDE  01/27/2022   MEDIASTINOSCOPY N/A 05/07/2015   Procedure: MEDIASTINOSCOPY;  Surgeon: Ivin Poot, MD;  Location: [facility];  Service: Thoracic;  Laterality: N/A;   TONSILLECTOMY     VIDEO BRONCHOSCOPY WITH ENDOBRONCHIAL ULTRASOUND N/A 05/07/2015   Procedure: VIDEO BRONCHOSCOPY WITH ENDOBRONCHIAL ULTRASOUND;  Surgeon: Ivin Poot, MD;  Location: [facility];  Service: Thoracic;  Laterality: N/A;    REVIEW OF SYSTEMS:   Review of Systems  Constitutional: Negative for appetite change, chills, fatigue, fever and unexpected weight change.  HENT:   Negative for mouth sores, nosebleeds, sore throat and trouble swallowing.   Eyes: Negative for eye problems and icterus.  Respiratory: Negative for cough, hemoptysis,  shortness of breath and wheezing.   Cardiovascular: Negative for chest pain and leg swelling.  Gastrointestinal: Negative for abdominal pain, constipation, diarrhea, nausea and vomiting.  Genitourinary: Negative for bladder incontinence, difficulty urinating, dysuria, frequency and hematuria.   Musculoskeletal: Negative for back pain, gait problem, neck pain and neck stiffness.  Skin: Negative for itching and rash.  Neurological: Negative for dizziness, extremity weakness, gait problem, headaches, light-headedness and seizures.  Hematological: Negative for adenopathy. Does not bruise/bleed easily.  Psychiatric/Behavioral: Negative for confusion, depression and sleep disturbance. The patient is not nervous/anxious.     PHYSICAL EXAMINATION:  There were no vitals taken for this visit.  ECOG PERFORMANCE STATUS: {CHL ONC ECOG [id number]  Physical Exam  Constitutional: Oriented to person, place, and time and well-developed, well-nourished, and in no distress. No distress.  HENT:  Head: Normocephalic and atraumatic.  Mouth/Throat: Oropharynx is clear and moist. No oropharyngeal exudate.  Eyes: Conjunctivae are normal. Right eye exhibits no discharge. Left eye exhibits no discharge. No scleral icterus.  Neck: Normal range of motion. Neck supple.  Cardiovascular: Normal rate, regular rhythm, normal heart sounds and intact distal pulses.   Pulmonary/Chest: Effort normal and breath sounds normal. No respiratory distress. No wheezes. No rales.  Abdominal: Soft. Bowel sounds are normal. Exhibits no distension and no mass. There is no tenderness.  Musculoskeletal: Normal range of motion. Exhibits no edema.  Lymphadenopathy:    No cervical adenopathy.  Neurological: Alert and oriented to person, place, and time. Exhibits normal muscle tone. Gait normal. Coordination normal.  Skin: Skin is warm and dry. No rash noted. Not diaphoretic. No erythema. No pallor.  Psychiatric: Mood, memory and  judgment normal.  Vitals reviewed.  LABORATORY DATA: Lab Results  Component Value Date   WBC 4.1 04/04/2022   HGB 12.7 04/04/2022   HCT 38.3 04/04/2022   MCV 89.5 04/04/2022   PLT 335 04/04/2022      Chemistry      Component Value Date/Time   NA 137 04/04/2022 1127   NA 140 01/26/2022 1153   NA 139 03/20/2017 1038   K 4.5 04/04/2022 1127   K 4.3 03/20/2017 1038   CL 104 04/04/2022 1127   CO2 31 04/04/2022 1127  CO2 27 03/20/2017 1038   BUN 14 04/04/2022 1127   BUN 14 01/26/2022 1153   BUN 15.4 03/20/2017 1038   CREATININE 0.82 04/04/2022 1127   CREATININE 0.7 03/20/2017 1038      Component Value Date/Time   CALCIUM 9.0 04/04/2022 1127   CALCIUM 9.8 03/20/2017 1038   ALKPHOS 68 04/04/2022 1127   ALKPHOS 37 (L) 03/20/2017 1038   AST 55 (H) 04/04/2022 1127   AST 25 03/20/2017 1038   ALT 85 (H) 04/04/2022 1127   ALT 22 03/20/2017 1038   BILITOT 0.3 04/04/2022 1127   BILITOT 0.42 03/20/2017 1038       RADIOGRAPHIC STUDIES:  US Thoracentesis Asp Pleural space w/IMG guide  Result Date: 04/07/2022 INDICATION: Patient with breast and lung cancer with complaint of shortness of breath and recurrent right pleural effusion. Request for therapeutic thoracentesis EXAM: ULTRASOUND GUIDED THERAPEUTIC RIGHT THORACENTESIS MEDICATIONS: 10 mL 1 % lidocaine COMPLICATIONS: None immediate. PROCEDURE: An ultrasound guided thoracentesis was thoroughly discussed with the patient and questions answered. The benefits, risks, alternatives and complications were also discussed. The patient understands and wishes to proceed with the procedure. Written consent was obtained. Ultrasound was performed to localize and mark an adequate pocket of fluid in the RIGHT chest. The area was then prepped and draped in the normal sterile fashion. 1% Lidocaine was used for local anesthesia. Under ultrasound guidance a 6 Fr Safe-T-Centesis catheter was introduced. Thoracentesis was performed. The catheter was  removed and a dressing applied. FINDINGS: A total of approximately 700 mL of clear, yellow fluid was removed. IMPRESSION: Successful ultrasound guided RIGHT therapeutic thoracentesis yielding 700 mL of pleural fluid. Read by: Narda Rutherford, AGNP-BC Electronically Signed   By: Michaelle Birks M.D.   On: 04/07/2022 12:25   DG Chest Port 1 View  Result Date: 04/07/2022 CLINICAL DATA:  Post thoracentesis EXAM: PORTABLE CHEST 1 VIEW COMPARISON:  02/14/2022 FINDINGS: Improved right lung aeration post thoracentesis with decreased pleural effusion. Small effusion remains. No pneumothorax. Trace left pleural effusion. Volume loss in the right chest related to prior lobectomy. Normal heart size. IMPRESSION: Improved right lung aeration post thoracentesis with small residual effusion. No pneumothorax. Electronically Signed   By: Macy Mis M.D.   On: 04/07/2022 11:06   CT Chest W Contrast  Result Date: 04/05/2022 CLINICAL DATA:  Non-small-cell lung cancer/adenocarcinoma in 2016. Right breast ductal carcinoma in-situ in 2022. * Tracking Code: BO * EXAM: CT CHEST, ABDOMEN, AND PELVIS WITH CONTRAST TECHNIQUE: Multidetector CT imaging of the chest, abdomen and pelvis was performed following the standard protocol during bolus administration of intravenous contrast. RADIATION DOSE REDUCTION: This exam was performed according to the departmental dose-optimization program which includes automated exposure control, adjustment of the mA and/or kV according to patient size and/or use of iterative reconstruction technique. CONTRAST:  153m OMNIPAQUE IOHEXOL 300 MG/ML  SOLN COMPARISON:  01/26/2022 CTA chest. Prior staging CTs of the chest abdomen and pelvis of 12/07/2021 FINDINGS: CT CHEST FINDINGS Cardiovascular: Aortic atherosclerosis. Normal heart size with minimal pericardial fluid, similar. No central pulmonary embolism, on this non-dedicated study. Mediastinum/Nodes: No supraclavicular adenopathy. Resolution of left axillary  adenopathy. Maximal nodal size 5 mm today versus 10 mm on the prior (when remeasured). No mediastinal or hilar adenopathy. Lungs/Pleura: Decrease in small right pleural effusion since 01/26/2022. Residual loculation anteriorly and superiorly. Areas of mild pleural thickening and irregularity including on 38/2 are nonspecific. Trace left pleural fluid is increased. Status post right upper lobectomy. Area of chronic consolidation within the posteromedial  right upper lung is favored to represent rounded atelectasis at 4.3 x 2.7 cm on 16/2. Relatively similar to 4.0 x 2.6 cm on 12/07/2021. No suspicious pulmonary nodule. Musculoskeletal: Left breast skin thickening. Right-sided lumpectomy. Nonacute bilateral rib fractures. T6 moderate compression deformity secondary to an underlying sclerotic metastasis, similar. T2 and T3 sclerotic metastasis, similar. CT ABDOMEN PELVIS FINDINGS Hepatobiliary: Too small to characterize right hepatic lobe lesions are well-circumscribed, similar, and likely tiny cysts. Normal gallbladder, without biliary ductal dilatation. Pancreas: Normal, without mass or ductal dilatation. Spleen: Normal in size, without focal abnormality. Adrenals/Urinary Tract: Normal adrenal glands. Normal kidneys, without hydronephrosis. Normal urinary bladder. Stomach/Bowel: Normal stomach, without wall thickening. Normal colon, appendix, and terminal ileum. Mid small bowel loops demonstrate apparent wall thickening, including on images 75 and 83 of series 2. Favored to be related to admixing of contrast and fluid within the bowel (artifactual). No surrounding adenopathy and no correlate in retrospect on 12/07/2021. No bowel obstruction. Vascular/Lymphatic: Aortic atherosclerosis. No abdominopelvic adenopathy. Reproductive: Normal uterus and adnexa. Other: No significant free fluid. No evidence of omental or peritoneal disease. Musculoskeletal: No acute osseous abnormality. IMPRESSION: 1. Since the chest CT of  01/26/2022, resolution of left axillary adenopathy. 2. Decrease in right-sided pleural effusion with residual loculation and nonspecific pleural thickening. 3. Presumed rounded atelectasis/chronic consolidation within the superior right lung, status post right upper lobectomy.1 4. Trace left pleural fluid, increased. 5. Similar thoracic spine compression deformities and chronic T6 pathologic fracture. 6. No acute process or evidence of metastatic disease in the abdomen or pelvis. 7. Apparent small bowel wall thickening is favored to be artifactual, secondary to mixing of fluid and contrast. If there are symptoms to suggest small bowel pathology, consider further evaluation with CT enterography. Electronically Signed   By: Abigail Miyamoto M.D.   On: 04/05/2022 09:10   CT Abdomen Pelvis W Contrast  Result Date: 04/05/2022 CLINICAL DATA:  Non-small-cell lung cancer/adenocarcinoma in 2016. Right breast ductal carcinoma in-situ in 2022. * Tracking Code: BO * EXAM: CT CHEST, ABDOMEN, AND PELVIS WITH CONTRAST TECHNIQUE: Multidetector CT imaging of the chest, abdomen and pelvis was performed following the standard protocol during bolus administration of intravenous contrast. RADIATION DOSE REDUCTION: This exam was performed according to the departmental dose-optimization program which includes automated exposure control, adjustment of the mA and/or kV according to patient size and/or use of iterative reconstruction technique. CONTRAST:  111m OMNIPAQUE IOHEXOL 300 MG/ML  SOLN COMPARISON:  01/26/2022 CTA chest. Prior staging CTs of the chest abdomen and pelvis of 12/07/2021 FINDINGS: CT CHEST FINDINGS Cardiovascular: Aortic atherosclerosis. Normal heart size with minimal pericardial fluid, similar. No central pulmonary embolism, on this non-dedicated study. Mediastinum/Nodes: No supraclavicular adenopathy. Resolution of left axillary adenopathy. Maximal nodal size 5 mm today versus 10 mm on the prior (when remeasured). No  mediastinal or hilar adenopathy. Lungs/Pleura: Decrease in small right pleural effusion since 01/26/2022. Residual loculation anteriorly and superiorly. Areas of mild pleural thickening and irregularity including on 38/2 are nonspecific. Trace left pleural fluid is increased. Status post right upper lobectomy. Area of chronic consolidation within the posteromedial right upper lung is favored to represent rounded atelectasis at 4.3 x 2.7 cm on 16/2. Relatively similar to 4.0 x 2.6 cm on 12/07/2021. No suspicious pulmonary nodule. Musculoskeletal: Left breast skin thickening. Right-sided lumpectomy. Nonacute bilateral rib fractures. T6 moderate compression deformity secondary to an underlying sclerotic metastasis, similar. T2 and T3 sclerotic metastasis, similar. CT ABDOMEN PELVIS FINDINGS Hepatobiliary: Too small to characterize right hepatic lobe lesions are  well-circumscribed, similar, and likely tiny cysts. Normal gallbladder, without biliary ductal dilatation. Pancreas: Normal, without mass or ductal dilatation. Spleen: Normal in size, without focal abnormality. Adrenals/Urinary Tract: Normal adrenal glands. Normal kidneys, without hydronephrosis. Normal urinary bladder. Stomach/Bowel: Normal stomach, without wall thickening. Normal colon, appendix, and terminal ileum. Mid small bowel loops demonstrate apparent wall thickening, including on images 75 and 83 of series 2. Favored to be related to admixing of contrast and fluid within the bowel (artifactual). No surrounding adenopathy and no correlate in retrospect on 12/07/2021. No bowel obstruction. Vascular/Lymphatic: Aortic atherosclerosis. No abdominopelvic adenopathy. Reproductive: Normal uterus and adnexa. Other: No significant free fluid. No evidence of omental or peritoneal disease. Musculoskeletal: No acute osseous abnormality. IMPRESSION: 1. Since the chest CT of 01/26/2022, resolution of left axillary adenopathy. 2. Decrease in right-sided pleural  effusion with residual loculation and nonspecific pleural thickening. 3. Presumed rounded atelectasis/chronic consolidation within the superior right lung, status post right upper lobectomy.1 4. Trace left pleural fluid, increased. 5. Similar thoracic spine compression deformities and chronic T6 pathologic fracture. 6. No acute process or evidence of metastatic disease in the abdomen or pelvis. 7. Apparent small bowel wall thickening is favored to be artifactual, secondary to mixing of fluid and contrast. If there are symptoms to suggest small bowel pathology, consider further evaluation with CT enterography. Electronically Signed   By: Abigail Miyamoto M.D.   On: 04/05/2022 09:10     ASSESSMENT/PLAN:  This is a very pleasant 66 year old Caucasian female with metastatic non-small cell lung cancer.  She was initially diagnosed with stage IIIb non-small cell lung cancer, adenocarcinoma with a negative EGFR, and ALK mutation.  She was diagnosed in October 2016.  Her molecular studies showed positive ROS1 nutation.  She completed concurrent chemoradiation followed by consolidation chemotherapy with carboplatin and Alimta. She was then on observation for 3 years.  She underwent palliative radiation to the left supraclavicular lymph node.  She then underwent SBRT to a left upper lobe lung nodule as well as left supraclavicular lymphadenopathy under the care of Dr. Tammi Klippel which was completed in October 2021.  CT scan then revealed evidence of disease recurrence in addition to a malignant right pleural effusion.  She then started on treatment with entrectinib 600 mg p.o. daily but had significant adverse side effects including fatigue, paresthesias, and gait imbalance.  Her dose of entrectinib was reduced to 400 mg p.o. daily but she continued to have adverse side effects and her dose was further reduced to 200 mg p.o. daily.  This was ultimately discontinued secondary to poor quality of life.  She also had a CT  scan that showed progressive disease with increased right pleural effusion and the cytology was consistent with adenocarcinoma.  She also had increased left axillary lymphadenopathy and asymmetrical left breast dermal thickening consistent with progressive breast cancer.  She is currently undergoing treatment with Xalkori 250 mg twice daily which was started on 02/11/2022.  She is tolerating this much better than attraction it.  She has some visual field flashes and mild nausea but overall it is tolerable.  The patient recently had a restaging CT scan performed.  Dr. Julien Nordmann personally independently reviewed the scan discussed results with the patient today.  The scan showed ***  Dr. Julien Nordmann recommends that she continue on the same treatment at the same dose.  We will see her back for follow-up visit in 6 weeks for evaluation and repeat blood work.  She will continue to follow closely with Dr. Barry Dienes and  Dr. Mariann Laster for history of breast cancer.  For history of pleural effusion, she will monitor her symptoms closely and we will arrange for therapeutic thoracentesis as needed.     No orders of the defined types were placed in this encounter.    I spent {CHL ONC TIME VISIT - [id number]} counseling the patient face to face. The total time spent in the appointment was {CHL ONC TIME VISIT - [id number]}.  Vyolet Sakuma L Koden Hunzeker, PA-C 04/09/22

## 2022-04-11 ENCOUNTER — Telehealth: Payer: Self-pay | Admitting: Cardiology

## 2022-04-11 ENCOUNTER — Other Ambulatory Visit: Payer: Medicare HMO

## 2022-04-11 ENCOUNTER — Other Ambulatory Visit: Payer: Self-pay

## 2022-04-11 ENCOUNTER — Inpatient Hospital Stay: Payer: Medicare HMO | Admitting: Physician Assistant

## 2022-04-11 VITALS — BP 119/71 | HR 88 | Temp 97.8°F | Resp 14 | Ht 63.0 in | Wt 158.1 lb

## 2022-04-11 DIAGNOSIS — C3490 Malignant neoplasm of unspecified part of unspecified bronchus or lung: Secondary | ICD-10-CM | POA: Diagnosis not present

## 2022-04-11 DIAGNOSIS — Z85118 Personal history of other malignant neoplasm of bronchus and lung: Secondary | ICD-10-CM | POA: Diagnosis not present

## 2022-04-11 DIAGNOSIS — J910 Malignant pleural effusion: Secondary | ICD-10-CM | POA: Diagnosis not present

## 2022-04-11 DIAGNOSIS — C7951 Secondary malignant neoplasm of bone: Secondary | ICD-10-CM | POA: Diagnosis not present

## 2022-04-11 DIAGNOSIS — C50911 Malignant neoplasm of unspecified site of right female breast: Secondary | ICD-10-CM | POA: Diagnosis not present

## 2022-04-11 DIAGNOSIS — R112 Nausea with vomiting, unspecified: Secondary | ICD-10-CM | POA: Diagnosis not present

## 2022-04-11 MED ORDER — ONDANSETRON 4 MG PO TBDP: 4.0000 mg | ORAL_TABLET | Freq: Three times a day (TID) | ORAL | 0 refills | Status: AC | PRN

## 2022-04-11 NOTE — Telephone Encounter (Signed)
Patient cancelled 10/11 follow-up with Dr. Geraldo Pitter. She states she is moving out of state and is requesting a recommendation/referral for a new cardiologist near [place], [place].

## 2022-04-11 NOTE — Telephone Encounter (Signed)
Left VM to call back 

## 2022-04-13 ENCOUNTER — Other Ambulatory Visit: Payer: Self-pay | Admitting: Medical Oncology

## 2022-04-13 ENCOUNTER — Telehealth: Payer: Self-pay

## 2022-04-13 ENCOUNTER — Ambulatory Visit: Payer: Medicare HMO | Admitting: Internal Medicine

## 2022-04-13 DIAGNOSIS — C3490 Malignant neoplasm of unspecified part of unspecified bronchus or lung: Secondary | ICD-10-CM

## 2022-04-13 MED ORDER — CRIZOTINIB 250 MG PO CAPS: 250.0000 mg | ORAL_CAPSULE | Freq: Two times a day (BID) | ORAL | 2 refills | Status: AC

## 2022-04-13 NOTE — Telephone Encounter (Signed)
Pt aware that Dr. Geraldo Pitter does not know any cardiologist in area of [place]. PT advised that once she finds a cardiologist that we will send her records. Pt verbalized understanding and had no additional questions.

## 2022-04-13 NOTE — Telephone Encounter (Signed)
Referral has been sent to: [facility] - Denzil Hughes, MD   [facility]: [phone] FX: [phone]   Referral Progress note Labs CT scans Surgical path   Confirmation was received.

## 2022-04-13 NOTE — Progress Notes (Signed)
Xalkori refill order sent to [facility].

## 2022-04-13 NOTE — Telephone Encounter (Signed)
Pt requested refill for xalkori,

## 2022-04-14 ENCOUNTER — Encounter: Payer: Self-pay | Admitting: *Deleted

## 2022-04-14 ENCOUNTER — Telehealth: Payer: Self-pay | Admitting: *Deleted

## 2022-04-14 NOTE — Patient Outreach (Signed)
  Care Coordination   Initial Visit Note   04/14/2022 Name: MILANNI AYUB MRN: [id number] DOB: [birth year]  Rose Figueroa is a 66 y.o. year old female who sees Masneri, Adele Barthel, DO for primary care. I spoke with  Lyndel Pleasure by phone today.  What matters to the patients health and wellness today?  No needs    Goals Addressed               This Visit's Progress     COMPLETED: No needs (pt-stated)        Care Coordination Interventions: Advised patient to schedule AWV with her primary provided. Reviewed medications with patient and discussed adherence and education accordingly Reviewed scheduled/upcoming provider appointments including none noted on Epic however pt pending an appointment with her primary provider on 04/22/2022 Screening for signs and symptoms of depression related to chronic disease state  Assessed social determinant of health barriers          SDOH assessments and interventions completed:  Yes  SDOH Interventions Today    Flowsheet Row Most Recent Value  SDOH Interventions   Food Insecurity Interventions Intervention Not Indicated  Housing Interventions Intervention Not Indicated  Transportation Interventions Intervention Not Indicated        Care Coordination Interventions Activated:  Yes  Care Coordination Interventions:  Yes, provided   Follow up plan: No further intervention required.   Encounter Outcome:  Pt. Visit Completed   Raina Mina, RN Care Management Coordinator [place] Office [phone]

## 2022-04-14 NOTE — Patient Instructions (Signed)
Visit Information  Thank you for taking time to visit with me today. Please don't hesitate to contact me if I can be of assistance to you.   Following are the goals we discussed today:   Goals Addressed               This Visit's Progress     COMPLETED: No needs (pt-stated)        Care Coordination Interventions: Advised patient to schedule AWV with her primary provided. Reviewed medications with patient and discussed adherence and education accordingly Reviewed scheduled/upcoming provider appointments including none noted on Epic however pt pending an appointment with her primary provider on 04/22/2022 Screening for signs and symptoms of depression related to chronic disease state  Assessed social determinant of health barriers          Please call the care guide team at [phone] if you need to cancel or reschedule your appointment.   If you are experiencing a Mental Health or [place] or need someone to talk to, please call the Suicide and Crisis Lifeline: [phone]  Patient verbalizes understanding of instructions and care plan provided today and agrees to view in [place]. Active MyChart status and patient understanding of how to access instructions and care plan via MyChart confirmed with patient.     No further follow up required: no needs  Raina Mina, RN Care Management Coordinator [place] Office [phone]

## 2022-04-21 DIAGNOSIS — E038 Other specified hypothyroidism: Secondary | ICD-10-CM | POA: Diagnosis not present

## 2022-05-04 ENCOUNTER — Telehealth: Payer: Self-pay | Admitting: Medical Oncology

## 2022-05-04 ENCOUNTER — Ambulatory Visit: Payer: Medicare HMO | Admitting: Cardiology

## 2022-05-04 NOTE — Telephone Encounter (Addendum)
"  No.  It was done on the original one from 2019.  Molecular study does not have to be done on every new specimen unless she has failure of the treatment.  This was not the case in her situation.  She quit the previous treatment because of toxicity not because of failure.  Thank you" .   Rose Figueroa notified of the above information and she would like the molecular reports we have. Faxed moleculars.

## 2022-05-04 NOTE — Telephone Encounter (Signed)
Was genetic testing done on lymph note from 12/17/21 bx?

## 2022-05-05 ENCOUNTER — Encounter: Payer: Self-pay | Admitting: Internal Medicine

## 2022-08-29 ENCOUNTER — Telehealth: Payer: Self-pay | Admitting: Cardiology

## 2022-08-29 NOTE — Telephone Encounter (Signed)
Patient is returning phone call.  °

## 2022-08-29 NOTE — Telephone Encounter (Signed)
Patient stated that she had moved to [place] and was going to see another heart doctor down in [place] soon. She asked if they needed copies of her records what phone number should they call to get her records. She was given the phone number that they should call to request records if needed. Patient had no further questions at this time.

## 2023-01-05 ENCOUNTER — Telehealth: Payer: Self-pay | Admitting: Radiation Oncology

## 2023-01-05 NOTE — Telephone Encounter (Signed)
Received a Medical record request from [facility]. Faxed medical records, request forwarded to Dosimetry.

## 2023-01-09 ENCOUNTER — Telehealth: Payer: Self-pay | Admitting: Radiation Oncology

## 2023-01-09 NOTE — Telephone Encounter (Signed)
Fax sent to [facility] on behalf of dosimetry team after 2 failed email attempts to send requested information to email provided. Requested an email that will work to send requested information.

## 2023-03-24 ENCOUNTER — Other Ambulatory Visit: Payer: Self-pay
